# Patient Record
Sex: Male | Born: 1940 | Race: White | Hispanic: No | Marital: Married | Smoking: Former smoker
Health system: Southern US, Community
[De-identification: ages and names within clinical notes are randomized; demographics above are authoritative.]

## PROBLEM LIST (undated history)

## (undated) DIAGNOSIS — R351 Nocturia: Secondary | ICD-10-CM

## (undated) DIAGNOSIS — I2510 Atherosclerotic heart disease of native coronary artery without angina pectoris: Secondary | ICD-10-CM

## (undated) DIAGNOSIS — H3320 Serous retinal detachment, unspecified eye: Secondary | ICD-10-CM

## (undated) DIAGNOSIS — N433 Hydrocele, unspecified: Secondary | ICD-10-CM

## (undated) DIAGNOSIS — H40059 Ocular hypertension, unspecified eye: Secondary | ICD-10-CM

## (undated) DIAGNOSIS — E785 Hyperlipidemia, unspecified: Secondary | ICD-10-CM

## (undated) DIAGNOSIS — Z8719 Personal history of other diseases of the digestive system: Secondary | ICD-10-CM

## (undated) DIAGNOSIS — Z9889 Other specified postprocedural states: Secondary | ICD-10-CM

## (undated) DIAGNOSIS — H269 Unspecified cataract: Secondary | ICD-10-CM

## (undated) DIAGNOSIS — I480 Paroxysmal atrial fibrillation: Secondary | ICD-10-CM

## (undated) DIAGNOSIS — K56609 Unspecified intestinal obstruction, unspecified as to partial versus complete obstruction: Secondary | ICD-10-CM

## (undated) DIAGNOSIS — K5790 Diverticulosis of intestine, part unspecified, without perforation or abscess without bleeding: Secondary | ICD-10-CM

## (undated) DIAGNOSIS — I10 Essential (primary) hypertension: Secondary | ICD-10-CM

## (undated) DIAGNOSIS — M1990 Unspecified osteoarthritis, unspecified site: Secondary | ICD-10-CM

## (undated) DIAGNOSIS — I6529 Occlusion and stenosis of unspecified carotid artery: Secondary | ICD-10-CM

## (undated) DIAGNOSIS — I639 Cerebral infarction, unspecified: Secondary | ICD-10-CM

## (undated) DIAGNOSIS — I739 Peripheral vascular disease, unspecified: Secondary | ICD-10-CM

## (undated) DIAGNOSIS — I724 Aneurysm of artery of lower extremity: Secondary | ICD-10-CM

## (undated) HISTORY — PX: ABDOMINAL HERNIA REPAIR: SHX539

## (undated) HISTORY — DX: Serous retinal detachment, unspecified eye: H33.20

## (undated) HISTORY — DX: Peripheral vascular disease, unspecified: I73.9

## (undated) HISTORY — DX: Diverticulosis of intestine, part unspecified, without perforation or abscess without bleeding: K57.90

## (undated) HISTORY — PX: CORONARY ARTERY BYPASS GRAFT: SHX141

## (undated) HISTORY — DX: Personal history of other diseases of the digestive system: Z87.19

## (undated) HISTORY — DX: Atherosclerotic heart disease of native coronary artery without angina pectoris: I25.10

## (undated) HISTORY — DX: Ocular hypertension, unspecified eye: H40.059

## (undated) HISTORY — PX: CAROTID ENDARTERECTOMY: SUR193

## (undated) HISTORY — PX: OTHER SURGICAL HISTORY: SHX169

## (undated) HISTORY — DX: Cerebral infarction, unspecified: I63.9

## (undated) HISTORY — DX: Other specified postprocedural states: Z98.890

## (undated) HISTORY — DX: Paroxysmal atrial fibrillation: I48.0

## (undated) HISTORY — DX: Unspecified intestinal obstruction, unspecified as to partial versus complete obstruction: K56.609

---

## 1998-01-17 DIAGNOSIS — Z9889 Other specified postprocedural states: Secondary | ICD-10-CM

## 1998-01-17 HISTORY — DX: Other specified postprocedural states: Z98.890

## 1998-11-27 ENCOUNTER — Encounter: Admission: RE | Admit: 1998-11-27 | Discharge: 1998-11-27 | Payer: Self-pay | Admitting: Gastroenterology

## 1998-11-27 ENCOUNTER — Encounter: Payer: Self-pay | Admitting: Gastroenterology

## 1999-01-13 ENCOUNTER — Encounter: Payer: Self-pay | Admitting: Vascular Surgery

## 1999-01-14 ENCOUNTER — Encounter: Payer: Self-pay | Admitting: Vascular Surgery

## 1999-01-14 ENCOUNTER — Encounter (INDEPENDENT_AMBULATORY_CARE_PROVIDER_SITE_OTHER): Payer: Self-pay | Admitting: *Deleted

## 1999-01-14 ENCOUNTER — Inpatient Hospital Stay: Admission: RE | Admit: 1999-01-14 | Discharge: 1999-01-18 | Payer: Self-pay | Admitting: Vascular Surgery

## 1999-01-15 ENCOUNTER — Encounter: Payer: Self-pay | Admitting: Vascular Surgery

## 1999-11-01 ENCOUNTER — Encounter: Admission: RE | Admit: 1999-11-01 | Discharge: 1999-12-21 | Payer: Self-pay | Admitting: Gastroenterology

## 2000-01-18 HISTORY — PX: CORONARY ANGIOPLASTY: SHX604

## 2000-02-19 HISTORY — PX: OTHER SURGICAL HISTORY: SHX169

## 2000-02-24 ENCOUNTER — Ambulatory Visit (HOSPITAL_COMMUNITY): Admission: RE | Admit: 2000-02-24 | Discharge: 2000-02-25 | Payer: Self-pay | Admitting: *Deleted

## 2000-05-04 ENCOUNTER — Encounter: Payer: Self-pay | Admitting: Gastroenterology

## 2000-05-04 ENCOUNTER — Encounter: Admission: RE | Admit: 2000-05-04 | Discharge: 2000-05-04 | Payer: Self-pay | Admitting: Gastroenterology

## 2000-05-10 ENCOUNTER — Encounter: Admission: RE | Admit: 2000-05-10 | Discharge: 2000-05-10 | Payer: Self-pay | Admitting: Gastroenterology

## 2000-05-10 ENCOUNTER — Encounter: Payer: Self-pay | Admitting: Gastroenterology

## 2000-05-26 ENCOUNTER — Encounter: Payer: Self-pay | Admitting: Gastroenterology

## 2000-05-26 ENCOUNTER — Encounter: Admission: RE | Admit: 2000-05-26 | Discharge: 2000-05-26 | Payer: Self-pay | Admitting: Gastroenterology

## 2001-10-31 ENCOUNTER — Ambulatory Visit (HOSPITAL_COMMUNITY): Admission: RE | Admit: 2001-10-31 | Discharge: 2001-10-31 | Payer: Self-pay | Admitting: Gastroenterology

## 2002-01-17 HISTORY — PX: RETINAL DETACHMENT SURGERY: SHX105

## 2002-12-10 ENCOUNTER — Ambulatory Visit (HOSPITAL_COMMUNITY): Admission: RE | Admit: 2002-12-10 | Discharge: 2002-12-11 | Payer: Self-pay | Admitting: Ophthalmology

## 2005-12-06 ENCOUNTER — Encounter: Admission: RE | Admit: 2005-12-06 | Discharge: 2005-12-06 | Payer: Self-pay | Admitting: Gastroenterology

## 2006-03-20 ENCOUNTER — Ambulatory Visit: Payer: Self-pay | Admitting: Vascular Surgery

## 2007-01-18 HISTORY — PX: OTHER SURGICAL HISTORY: SHX169

## 2007-04-13 ENCOUNTER — Ambulatory Visit: Payer: Self-pay | Admitting: Vascular Surgery

## 2007-08-15 ENCOUNTER — Ambulatory Visit (HOSPITAL_BASED_OUTPATIENT_CLINIC_OR_DEPARTMENT_OTHER): Admission: RE | Admit: 2007-08-15 | Discharge: 2007-08-15 | Payer: Self-pay | Admitting: Orthopedic Surgery

## 2008-02-20 ENCOUNTER — Encounter: Admission: RE | Admit: 2008-02-20 | Discharge: 2008-02-20 | Payer: Self-pay | Admitting: Interventional Cardiology

## 2008-03-14 ENCOUNTER — Ambulatory Visit: Payer: Self-pay | Admitting: Vascular Surgery

## 2008-04-22 ENCOUNTER — Ambulatory Visit (HOSPITAL_COMMUNITY): Admission: RE | Admit: 2008-04-22 | Discharge: 2008-04-22 | Payer: Self-pay | Admitting: Surgery

## 2008-04-22 ENCOUNTER — Ambulatory Visit: Payer: Self-pay | Admitting: Surgery

## 2008-05-02 ENCOUNTER — Ambulatory Visit: Payer: Self-pay | Admitting: Vascular Surgery

## 2008-09-19 ENCOUNTER — Encounter: Admission: RE | Admit: 2008-09-19 | Discharge: 2008-09-19 | Payer: Self-pay | Admitting: Gastroenterology

## 2008-09-22 ENCOUNTER — Inpatient Hospital Stay (HOSPITAL_COMMUNITY): Admission: EM | Admit: 2008-09-22 | Discharge: 2008-09-25 | Payer: Self-pay | Admitting: Emergency Medicine

## 2008-09-22 HISTORY — PX: OTHER SURGICAL HISTORY: SHX169

## 2008-09-23 HISTORY — PX: OTHER SURGICAL HISTORY: SHX169

## 2008-11-14 ENCOUNTER — Ambulatory Visit: Payer: Self-pay | Admitting: Vascular Surgery

## 2008-11-14 ENCOUNTER — Encounter: Admission: RE | Admit: 2008-11-14 | Discharge: 2008-11-14 | Payer: Self-pay | Admitting: Vascular Surgery

## 2010-01-05 ENCOUNTER — Ambulatory Visit: Payer: Self-pay | Admitting: Vascular Surgery

## 2010-04-23 LAB — URINALYSIS, ROUTINE W REFLEX MICROSCOPIC
Bilirubin Urine: NEGATIVE
Bilirubin Urine: NEGATIVE
Ketones, ur: 15 mg/dL — AB
Ketones, ur: NEGATIVE mg/dL
Nitrite: NEGATIVE
Protein, ur: 100 mg/dL — AB
Specific Gravity, Urine: 1.013 (ref 1.005–1.030)
Specific Gravity, Urine: 1.025 (ref 1.005–1.030)
Urobilinogen, UA: 0.2 mg/dL (ref 0.0–1.0)
pH: 6 (ref 5.0–8.0)

## 2010-04-23 LAB — URINE CULTURE
Colony Count: NO GROWTH
Culture: NO GROWTH

## 2010-04-23 LAB — CBC
HCT: 42.8 % (ref 39.0–52.0)
Hemoglobin: 14.3 g/dL (ref 13.0–17.0)
MCHC: 33.3 g/dL (ref 30.0–36.0)
MCHC: 33.7 g/dL (ref 30.0–36.0)
MCV: 93.7 fL (ref 78.0–100.0)
Platelets: 179 10*3/uL (ref 150–400)
RDW: 16.2 % — ABNORMAL HIGH (ref 11.5–15.5)
RDW: 16.2 % — ABNORMAL HIGH (ref 11.5–15.5)
WBC: 6 10*3/uL (ref 4.0–10.5)
WBC: 9.6 10*3/uL (ref 4.0–10.5)

## 2010-04-23 LAB — DIFFERENTIAL
Basophils Absolute: 0.1 10*3/uL (ref 0.0–0.1)
Lymphocytes Relative: 10 % — ABNORMAL LOW (ref 12–46)
Neutro Abs: 7.4 10*3/uL (ref 1.7–7.7)
Neutrophils Relative %: 77 % (ref 43–77)

## 2010-04-23 LAB — COMPREHENSIVE METABOLIC PANEL
AST: 30 U/L (ref 0–37)
Albumin: 4.6 g/dL (ref 3.5–5.2)
Alkaline Phosphatase: 43 U/L (ref 39–117)
GFR calc non Af Amer: 47 mL/min — ABNORMAL LOW (ref >60)
Potassium: 2.8 mEq/L — ABNORMAL LOW (ref 3.5–5.1)
Sodium: 136 mEq/L (ref 135–145)

## 2010-04-23 LAB — BASIC METABOLIC PANEL
BUN: 11 mg/dL (ref 6–23)
BUN: 23 mg/dL (ref 6–23)
Calcium: 8.9 mg/dL (ref 8.4–10.5)
Calcium: 9 mg/dL (ref 8.4–10.5)
Creatinine, Ser: 0.91 mg/dL (ref 0.4–1.5)
Creatinine, Ser: 1.03 mg/dL (ref 0.4–1.5)
GFR calc Af Amer: >60 mL/min (ref >60)
GFR calc Af Amer: >60 mL/min (ref >60)
GFR calc non Af Amer: >60 mL/min (ref >60)
GFR calc non Af Amer: >60 mL/min (ref >60)
Glucose, Bld: 110 mg/dL — ABNORMAL HIGH (ref 70–99)
Potassium: 3.2 mEq/L — ABNORMAL LOW (ref 3.5–5.1)
Sodium: 137 mEq/L (ref 135–145)

## 2010-04-23 LAB — LIPASE, BLOOD: Lipase: 249 U/L — ABNORMAL HIGH (ref 11–59)

## 2010-04-23 LAB — MAGNESIUM
Magnesium: 2.2 mg/dL (ref 1.5–2.5)
Magnesium: 2.3 mg/dL (ref 1.5–2.5)

## 2010-04-23 LAB — ETHANOL: Alcohol, Ethyl (B): <5 mg/dL (ref 0–10)

## 2010-04-23 LAB — POCT CARDIAC MARKERS
CKMB, poc: 1.6 ng/mL (ref 1.0–8.0)
Troponin i, poc: <0.05 ng/mL (ref 0.00–0.09)

## 2010-04-23 LAB — URINE MICROSCOPIC-ADD ON

## 2010-04-23 LAB — LACTIC ACID, PLASMA: Lactic Acid, Venous: 3.6 mmol/L — ABNORMAL HIGH (ref 0.5–2.2)

## 2010-04-23 LAB — HEMOCCULT GUIAC POC 1CARD (OFFICE): Fecal Occult Bld: NEGATIVE

## 2010-04-28 LAB — POCT I-STAT, CHEM 8
Calcium, Ion: 1.15 mmol/L (ref 1.12–1.32)
Chloride: 102 mEq/L (ref 96–112)
Glucose, Bld: 94 mg/dL (ref 70–99)
HCT: 40 % (ref 39.0–52.0)
Hemoglobin: 13.6 g/dL (ref 13.0–17.0)
Potassium: 3.7 mEq/L (ref 3.5–5.1)

## 2010-06-01 NOTE — Procedures (Signed)
CAROTID DUPLEX EXAM   INDICATION:  Followup carotid artery disease.   HISTORY:  Diabetes:  No.  Cardiac:  Coronary artery disease, cardiac stents in February 2002.  Hypertension:  Yes.  Smoking:  Quit.  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 Arbie Cookey.  CV History:  No.  Amaurosis Fugax No, Paresthesias No, Hemiparesis No                                       RIGHT             LEFT  Brachial systolic pressure:         130               128  Brachial Doppler waveforms:         Normal            Normal  Vertebral direction of flow:        Antegrade         Antegrade  DUPLEX VELOCITIES (cm/sec)  CCA peak systolic                   72                81  ECA peak systolic                   99                73  ICA peak systolic                   131               234  ICA end diastolic                   37                54  PLAQUE MORPHOLOGY:                  Mixed             Mixed  PLAQUE AMOUNT:                      Mild              Moderate  PLAQUE LOCATION:                    ICA               ICA    IMPRESSION:  1. Right internal carotid artery velocity suggests a 1%-39% stenosis.  2. Left internal carotid artery velocity suggests a 40%-59% stenosis.         ___________________________________________  Larina Earthly, M.D.   EM/MEDQ  D:  01/05/2010  T:  01/05/2010  Job:  [id number]

## 2010-06-01 NOTE — Assessment & Plan Note (Signed)
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  He has  returned from an extensive cruise throughout [place], [place] and [place].  He  reports that he is continuing his walking program very vigorously and is  improving his walking distance prior to claudication.  He had undergone  a recent arteriography for evaluation of his left popliteal artery  aneurysm.  I reviewed this with the patient and again reviewed his CT  scan.  This does show mild to moderate stenosis in his adductor canal  bilaterally, more so on the left than on the right, the popliteal artery  aneurysm itself does not show any evidence of dissection or other  problems more concerning on angio.  Also of concern, he does have single  vessel peroneal runoff bilaterally.  I discussed all of these with [name].  I again reviewed his CT scan with him.  This did show a 2 cm  popliteal artery aneurysm on the left with a probable ulceration in the  area of mural thrombus.  I did explain the risk for thrombosis of this.  I feel that his risk of this is relatively small in a shorter period of  time and I have recommended that we see him again with a CTA in 6  months.  I explained certainly another option would be to proceed with  elective repair and at which he would require a left femoral to below-  knee popliteal bypass with vein and ligation of his popliteal artery  aneurysm.  I explained the potential limitation of this with single  vessel runoff and I feel that he certainly is at least equal risk or  greater risk with surgery than simple observation with his aneurysm.  He  will notify us immediately should he develop any ischemic symptoms and  we would recommend repair if he shows any change in his  aneurysm.  We  will see him again in 6 months.   Larina Earthly, M.D.  Electronically Signed   TFE/MEDQ  D:  05/02/2008  T:  05/02/2008  Job:  [id number]   cc:   Corky Crafts, MD  Armanda Magic, M.D.  Danise Edge, M.D.

## 2010-06-01 NOTE — Procedures (Signed)
VASCULAR LAB EXAM   INDICATION:   HISTORY:  Diabetes:  No.  Cardiac:  Coronary artery disease, cardiac stents, February 2002.  Hypertension:  Yes.   EXAM:  Bilateral popliteal artery scan to rule out aneurysm.   IMPRESSION:  1. Right popliteal artery maximum diameter measures 1.03 x 1.00 cm.  2. Left popliteal artery maximum diameter measures 0.87 x 0.84 cm.  3. Incidental finding of a dilatation of the left popliteal vein of      1.53 cm x 1.48 cm.   INDICATIONS:  Rule out left popliteal aneurysm.   ___________________________________________  Larina Earthly, M.D.   EM/MEDQ  D:  01/05/2010  T:  01/05/2010  Job:  [id number]

## 2010-06-01 NOTE — Assessment & Plan Note (Signed)
OFFICE VISIT   Marcus, Frank  DOB:  October 09, 1940                                       01/05/2010  ZOXWR#:[id number]   Patient presents today for follow-up of his diffuse peripheral vascular  occlusive disease.  He is status post resection grafting of abdominal  aortic aneurysm with aortic to bilateral distal common iliac artery  bypass in December 2000.  He does have known asymptomatic carotid  disease and also has small popliteal artery aneurysms bilaterally.  He  is here today for followup of this.  He remains quite active and has an  active walking program.  He does not have any new cardiac difficulty.  He did have coronary artery stenting in 2002.  He specifically denies  any amaurosis fugax, transient ischemic attack, or stroke.   PHYSICAL EXAMINATION:  A well-developed and well-nourished white male  appearing younger than stated age of 1 in no acute distress.  Blood  pressure is 153/70, pulse 56, respirations 18.  HEENT is normal.  Chest:  Clear bilaterally.  His carotid arteries are without bruits bilaterally.  He has 2+ radial pulses.  He has 2+ femoral, 2+ popliteal, and 2+  posterior tibial pulses.  He does have prominent popliteal pulsations  bilaterally.  Musculoskeletal shows no major deformities or cyanosis.  Neurologic:  No focal weakness or paresthesias.  Skin without ulcers or  rashes.   He underwent repeat carotid duplex in our office, and this shows no  significant change.  He has moderate 40% to 59% stenosis in the left and  no significant stenosis in the right carotid system.  He also underwent  imaging of his popliteal arteries showing no significant change and mild  ectasia in his popliteal arteries bilaterally.   I discussed this at length with patient. I explained that he does have  some irregularity from his old CT angiogram 1 year ago of his left  popliteal artery.  He understands symptoms of carotid occlusion and will  notify us immediately should this occur, otherwise we will see him again  in 1 year with repeat carotid duplex and repeat CT angiogram to rule out  any changes in his popliteal artery on the left.     Larina Earthly, M.D.  Electronically Signed   TFE/MEDQ  D:  01/05/2010  T:  01/05/2010  Job:  [id number]   cc:   Candyce Churn, M.D.

## 2010-06-01 NOTE — Op Note (Signed)
NAMEMANVIR, PRABHU              ACCOUNT NO.:  [id number]   MEDICAL RECORD NO.:  [id number]          PATIENT TYPE:  AMB   LOCATION:  SDS                          FACILITY:  [facility]   PHYSICIAN:  Juleen China IV, MDDATE OF BIRTH:  09/17/1940   DATE OF PROCEDURE:  04/22/2008  DATE OF DISCHARGE:                               OPERATIVE REPORT   PREOPERATIVE DIAGNOSIS:  Popliteal aneurysm.   POSTOPERATIVE DIAGNOSIS:  Popliteal aneurysm.   PROCEDURES PERFORMED:  1. Ultrasound access, right femoral artery.  2. Abdominal aortogram.  3. Bilateral lower extremity runoff.   INDICATIONS:  This is a 70 year old gentleman with history of  aortobifemoral bypass graft.  He was found to have popliteal aneurysm,  left greater than right on diagnostic CT angiogram.  He comes today for  further evaluation.   PROCEDURE:  The patient identified in the holding and taken to room #8,  was placed supine on the table.  Bilateral groins were prepped and  draped in a standard sterile fashion.  Time-out was called.  The right  femoral artery was evaluated by ultrasound and found to be widely  patent, easily compressible.  Lidocaine 1% was used for local  anesthesia.  Right femoral artery was accessed under ultrasound guidance  with an 18-gauge needle.  The 0.35 Bentson wire was advanced to the  aorta under fluoroscopic visualization.  Over the wire, an Omni flush  catheter was advanced at the level of L1 and an abdominal aortogram was  obtained.  Next, catheter was pulled down below the renal arteries and  bilateral lower extremity runoff was obtained.   FINDINGS:  Aortogram:  The visualized portions of the suprarenal  abdominal aorta showed minimal disease.  There are single renal arteries  bilaterally which are widely patent.  An aortic bifurcation graft is  visualized below the renal arteries.  There is mild irregularity at the  anastomotic site.  However, there does not appear to be a significant  stenosis.  Both limbs of the graft are widely patent.   Right lower extremity:  The right common femoral artery is widely  patent.  Right profunda femoral artery is widely patent.  The right  superficial femoral artery is patent throughout its course.  There is a  high-grade stenosis at the level of the adductor canal.  There is  ectasia seen of the popliteal artery throughout its course.  There is  diffuse disease in the distal popliteal artery.  The patient has single-  vessel runoff via the peroneal artery.   Left lower extremity:  The left common femoral artery is widely patent.  The left profunda femoral artery is widely patent.  The left superficial  femoral artery is patent.  There is diffuse disease throughout the  distal superficial femoral artery through the adductor canal.  There is  dilatation of the left popliteal artery.  There is irregularity in the  distal popliteal artery.  Anterior tibial and posterior tibial artery  are occluded at the origin.  Peroneal artery is visualized, however,  this takes a deviated course at the mid calf suggesting it is  collateralized.  There is reconstitution of the posterior tibial artery  in the mid calf, it is not well visualized across the foot.   After the above images were obtained, decision was made to terminate the  procedure.  Catheter and wires were removed.  The patient was taken to  holding area for sheath pull.   IMPRESSION:  1. Widely patent aortobifemoral bypass graft.  2. Bilateral superficial femoral artery disease, left greater than      right  3. Bilateral popliteal artery ectasia/aneurysmal disease changes.  4. Bilateral single-vessel runoff via the peroneal artery.      Jorge Ny, MD  Electronically Signed     VWB/MEDQ  D:  04/22/2008  T:  04/22/2008  Job:  [id number]

## 2010-06-01 NOTE — Procedures (Signed)
CAROTID DUPLEX EXAM   INDICATION:  Followup carotid artery disease.   HISTORY:  Diabetes:  No.  Cardiac:  Coronary artery disease, cardiac stents in February of 2002.  Hypertension:  Yes.  Smoking:  Quit 30 years ago.  Previous Surgery:  The patient had repair of an abdominal aortic  aneurysm in 12/1998 by Dr. Arbie Cookey.  CV History:  Amaurosis Fugax No, Paresthesias No, Hemiparesis No                                       RIGHT             LEFT  Brachial systolic pressure:         130               124  Brachial Doppler waveforms:         Biphasic          Biphasic  Vertebral direction of flow:        Antegrade         Antegrade  DUPLEX VELOCITIES (cm/sec)  CCA peak systolic                   96                104  ECA peak systolic                   72                97  ICA peak systolic                   130               218  ICA end diastolic                   41                60  PLAQUE MORPHOLOGY:                  Mixed             Mixed  PLAQUE AMOUNT:                      Mild              Moderate  PLAQUE LOCATION:                    ICA               ICA   IMPRESSION:  1. Right ICA stenosis 40-59%.  2. Left ICA stenosis 40-59%, upper end of range.  3. Study essentially unchanged from 03/20/2006.   ___________________________________________  Larina Earthly, M.D.   DP/MEDQ  D:  04/13/2007  T:  04/13/2007  Job:  [id number]

## 2010-06-01 NOTE — Consult Note (Signed)
NEW PATIENT CONSULTATION   [name], [name]  DOB:  17-Jun-1940                                       03/14/2008  ZOXWR#:[id number]   Patient presents today for evaluation of recently discovered left  popliteal artery aneurysm.  He had been having lower extremity calf  claudication symptoms and underwent CTA for further evaluation of this.  On CTA, he was found to have  moderate bilateral superficial femoral  artery stenosis.  Of concern, he was also found to have a left popliteal  artery aneurysm with mural thrombus and either dissection or ulcerative  plaque.  He has been on a very active walking program and is improving  his claudication-type symptoms.  I am seeing him for further discussion.  He is well known to me for prior resection grafting with infrarenal  abdominal aortic aneurysm in October, 2003.  He has had no difficulty  following this.   He has a history of coronary artery disease with cardiac stents in 2003.  He did not have a myocardial infarction.  He is hypertensive.  He quit  smoking 35 years ago.  He does have social alcohol consumption daily.   REVIEW OF SYSTEMS:  Positive for heart murmur and pain with walking,  otherwise negative.   PHYSICAL EXAMINATION:  Height is 6 feet 1 inches.  He weighs 195 pounds.  A well-developed white male appearing his stated age of 2.  Blood  pressure is 125/71, pulse 62, respirations 18.  His radial pulses are 2+  bilaterally.  He has 2+ femoral, 2+ popliteal, and 1 to 2+ dorsalis  pedis pulses bilaterally.  Popliteal artery pulse is slightly prominent  bilaterally.  He did not have any tenderness in this area.   I reviewed his CAT scan.  This showed an excellent 10-year appearance of  his infrarenal aortobiiliac bypass.  His lower extremity flow is patent.  He does have an area of ectasia in both popliteal arteries, more so on  the left with a slightly less than 2 cm maximal dilatation.  I am  concerned  regarding the configuration of the aneurysm on the left.  He  does have mural thrombus present and irregular flow in this.   I discussed the concern regarding thromboses of popliteal artery  aneurysm and the high risk of amputation resulting from this.  He is  about to leave on an extended overseas trip.  I explained that I would  certainly proceed with this and would take the usual precautions of  walking every several hours during the flight.  He will contact us when  he returns, and we will schedule arteriography as an outpatient for  further evaluation of his left popliteal artery aneurysm.  Further  recommendations pending this result.   Larina Earthly, M.D.  Electronically Signed   TFE/MEDQ  D:  03/14/2008  T:  03/17/2008  Job:  [id number]   cc:   Corky Crafts, MD  Armanda Magic, M.D.  Danise Edge, M.D.

## 2010-06-01 NOTE — Assessment & Plan Note (Signed)
OFFICE VISIT   DECLAN, MIER  DOB:  Sep 04, 1940                                       11/14/2008  ZOXWR#:[id number]   Patient presents today for continued follow-up of his popliteal artery  aneurysms.  Since my last visit with him, he did have hospitalization  and surgery for a small bowel obstruction, was treated by Dr. Wenda Low.  He is returning to his usual activities following this.  He  does not have any claudication-type symptoms.  He is extremely vigorous  and continues to be very active, playing golf and walking with this.  He  does have good control of his high blood pressure and elevated  cholesterol.   Family history is significant for premature atherosclerotic disease in  his mother.   He is married, retired, with 3 children.  Quit smoking 35 years ago.  Has 2 to 3 ounces of alcohol daily.   Review of systems is noted in his chart with arthritic pain in his legs.   PHYSICAL EXAMINATION:  A well-developed and well-nourished white male  appearing stated age of 78.  His blood pressure is 127/70, pulse 65,  respirations 18, temperature 97.9.  Abdominal exam reveals a well-healed  lower midline incision.  No tenderness.  He does have 2+ femoral pulses.  He is grossly intact neurologically, and he is in no distress in  general.  He does have prominent popliteal pulses bilaterally and no  pain and palpable pedal pulses bilaterally.   He underwent a CT angiogram today, and I have reviewed this with  patient.  This does show no change in his popliteal ectasia on the left  with a maximal diameter of approximately 2 cm.  He does have  irregularity of flow, and this is no change from his prior study.  He  was reassured of this and will see [place] again in 1 year with ultrasound of  his popliteal arteries.   Larina Earthly, M.D.  Electronically Signed   TFE/MEDQ  D:  11/14/2008  T:  11/18/2008  Job:  [id number]   cc:   Corky Crafts, MD  Armanda Magic, M.D.  Danise Edge, M.D.  [place] Daphine Deutscher, MD

## 2010-06-01 NOTE — Op Note (Signed)
NAMEBENTON, TOOKER              ACCOUNT NO.:  [id number]   MEDICAL RECORD NO.:  [id number]          PATIENT TYPE:  AMB   LOCATION:  DSC                          FACILITY:  [facility]   PHYSICIAN:  Artist Pais. Weingold, M.D.DATE OF BIRTH:  1940-10-07   DATE OF PROCEDURE:  08/15/2007  DATE OF DISCHARGE:                               OPERATIVE REPORT   PREOPERATIVE DIAGNOSIS:  Chronic right small finger stenosing  tenosynovitis.   POSTOPERATIVE DIAGNOSIS:  Chronic right small finger stenosing  tenosynovitis.   PROCEDURE:  Right A1 pulley release, fifth digit.   SURGEON:  Artist Pais. Mina Marble, MD   ASSISTANT:  None.   ANESTHESIA:  Monitored anesthesia care with 4 mL of combination of 2%  plain lidocaine and 0.25% plain Marcaine.   TOURNIQUET TIME:  11 minutes.   No complications.   No drains.   OPERATIVE REPORT:  The patient was taken to the operating suite after  the induction of adequate IV sedation.  The right upper extremity was  prepped and draped in usual sterile fashion.  An Esmarch was used to  exsanguinate the limb.  Tourniquet was then inflated to 275 mmHg.  At  this point in time, 4 mL of a combination of 2% plain lidocaine and  0.25% Marcaine was injected into the A1 pulley area of the right hand  over the fifth digit A1 pulley area.  Once the anesthesia was obtained,  transverse incision was made of the A1 pulley.  Neurovascular bundle was  identified and retracted.  The A1 pulley was split with 15 blade.  The  FDS and FDP tendon was lysed of all adhesions and was irrigated and  loosely closed with a 4-0 Vicryl Rapide subcuticular stitch.  Steri-  Strips, 4x4s, and a compressive dressing was applied.  The patient  tolerated the procedure well and went to recovery room in stable  fashion.      Artist Pais Mina Marble, M.D.  Electronically Signed     MAW/MEDQ  D:  08/15/2007  T:  08/15/2007  Job:  [id number]

## 2010-06-04 NOTE — Op Note (Signed)
NAME:  [name], [name]                        ACCOUNT NO.:  [id number]   MEDICAL RECORD NO.:  [id number]                   PATIENT TYPE:  OIB   LOCATION:  5734                                 FACILITY:  [facility]   PHYSICIAN:  Beulah Gandy. Ashley Royalty, M.D.              DATE OF BIRTH:  [birth year]   DATE OF PROCEDURE:  DATE OF DISCHARGE:                                 OPERATIVE REPORT   PREOPERATIVE DIAGNOSIS:  Multiple breaks, left retina, retinal detachment,  right eye.   POSTOPERATIVE DIAGNOSIS:  Multiple  breaks, left retina, retinal detachment,  right eye.   PROCEDURE:  1. Laser for retinal breaks, left eye.  2. Scleral buckle with laser, right eye.   DESCRIPTION OF PROCEDURE:  Usual prep and drape. The indirect ophthalmoscope  laser was moved into place and 298 burns were placed around retinal breaks  at 12 o'clock and 4 o'clock in the left eye. The power was 380 milliwatts,  1000 microns each and 0.1 seconds each. The eye was treated  ointment and  taped closed. Attention was carried to the right eye where  the usual prep  and drape was performed. A 360 degree limbal peritomy, isolation of 4  muscles on 2-0 silk. Localization of break at 2 o'clock.   Scleral dissection from 1 o'clock to 4 o'clock to admit a #279 intrascleral  implant. Diathermy placed in the bed. Perforation site chosen at 2:30  o'clock in the bed. A small  amount of subretinal fluid came fourth. A 508 G  radial segment was placed beneath the break at 2:30 o'clock and the 279  implant was placed against  the globe. The 240 band  was placed around the  eye with belt loops at 6, 8 and 10 o'clock. The 270 sleeve was placed at 12  o'clock.   The scleral flaps were closed over the scleral buckles. The band was  adjusted to a proper indentation of the globe. Paracentesis x1. Obtained a  closing tension of 10 with a Behr keratometer. The buckle was trimmed. The  band was adjusted and trimmed. The sutures were knotted and  trimmed.  Indirect ophthalmoscopy showed the retina to be lying nicely on the buckle  with the break well supported.   The indirect ophthalmoscope laser was moved into place again and 560 burns  were placed around the retinal periphery, especially around the  retinal  break on the scleral buckle. The power  was  600 milliwatts, 1000 microns  each and 0.1 seconds each. The retina was lying nicely on the buckle with  the retinal break well supported.   The conjunctiva was reposited with 7-0 chromic suture. Polymixin and  gentamicin were irrigated in the tenon space. Atropine solution was applied.  Decadron 10 mg was injected into the lower subconjunctival space. Marcaine  was injected around the globe  for postoperative pain. Polysporin, a patch  and shield were placed. The patient  was awakened and taken to the recovery  room in satisfactory condition.                                               Beulah Gandy. Ashley Royalty, M.D.    JDM/MEDQ  D:  12/10/2002  T:  12/11/2002  Job:  [id number]

## 2010-06-04 NOTE — Op Note (Signed)
   NAME:  [name], [name]                        ACCOUNT NO.:  [id number]   MEDICAL RECORD NO.:  [id number]                   PATIENT TYPE:  AMB   LOCATION:  ENDO                                 [facility]AC[facility]   PHYSICIAN:  Danise Edge, M.D.                DATE OF BIRTH:  08/15/1940   DATE OF PROCEDURE:  10/31/2001  DATE OF DISCHARGE:                                 OPERATIVE REPORT   PROCEDURE:  Screening colonoscopy.   INDICATIONS FOR PROCEDURE:  Mr. Reeves Musick is a 70 year old male born  [birth year]. Mr. Phillips is scheduled to undergo his second screening  colonoscopy. He has a positive family history for colon cancer. He has no  personal history of colorectal neoplasia.   CHRONIC MEDICATIONS:  1. Vioxx.  2. Altace.  3. Lipitor.  4. Aspirin.  5. Zetia.   PAST MEDICAL PROBLEMS:  1. Coronary artery disease.  2. Three coronary stents placed.  3. Insignificant coronary artery stenosis monitored by Dr. Gretta Began.  4. Abdominal aortic aneurysm repair, January 14, 1999.  5. Increased intraocular pressure monitored by his ophthalmologist.  6. Inguinal herniorrhaphy.  7. Hypercholesterolemia.   ENDOSCOPIST:  Charolett Bumpers, M.D.   PREMEDICATION:  Versed 10 mg, Demerol 50 mg .   ENDOSCOPE:  Olympus pediatric colonoscope.   DESCRIPTION OF PROCEDURE:  After obtaining informed consent, Mr. Arnone was  placed in the left lateral decubitus position. I administered intravenous  Demerol and intravenous Versed to achieve conscious sedation for the  procedure. The patient's blood pressure, oxygen saturation and cardiac  rhythm were monitored throughout the procedure and documented in the medical  record.   Anal inspection was normal. Digital rectal exam revealed a nonnodular  prostate. The Olympus pediatric video colonoscope was introduced into the  rectum and easily advanced to the cecum. Colonic preparation for the exam  today was excellent.   RECTUM:   Normal.   SIGMOID COLON AND DESCENDING COLON:  Left colonic diverticulosis.   SPLENIC FLEXURE:  Normal.   TRANSVERSE COLON:  Normal.   HEPATIC FLEXURE:  Normal.   ASCENDING COLON:  Normal.   CECUM AND ILEOCECAL VALVE:  Normal.   ASSESSMENT:  Normal screening proctocolonoscopy to the cecum except for the  presence of left colonic diverticulosis.   RECOMMENDATIONS:  Repeat colonoscopy in five years.                                                Danise Edge, M.D.    MJ/MEDQ  D:  10/31/2001  T:  10/31/2001  Job:  [id number]   cc:   Larina Earthly, M.D.  [address]  [place]  [place] [address]  Fax: [phone]

## 2010-06-04 NOTE — Cardiovascular Report (Signed)
[place]  Patient:    [name], [name]                     MRN: [id number] Proc. Date: 02/24/00 Adm. Date:  [id number] Disc. Date: [id number] Attending:  Meade Maw A CC:         Lemont Fillers. Fraser Din, M.D.  Pearla Dubonnet, M.D.   Cardiac Catheterization  PROCEDURES PERFORMED: 1. Percutaneous coronary intervention/stent implantation, proximal left    anterior descending. 2. Percutaneous coronary intervention/stent implantation, first obtuse    marginal.  INDICATIONS:  [name] is a 70 year old man with a recent onset of angina pectoris.  He underwent myocardial perfusion study which revealed evidence of significant anterior ischemia.  Coronary angiography earlier today by Dr. Meade Maw has revealed a subtotal stenosis in the proximal LAD, as well as a severe stenosis at the origin of the large first marginal branch. The patient is to undergo catheter-based revascularization at this time.  DESCRIPTION OF PROCEDURE:  The previously placed 6 French catheter sheath was exchanged over a long guiding J wire for a 7 French catheter sheath.  The groin had previously been reprepped and draped in the usual sterile fashion. Local anesthesia was obtained with the infiltration of 1% lidocaine. The patient then received 5000 units of heparin double bolus and constant infusion of Integrilin.  Initial ACT was 235 seconds.  A 7 French FL4 Scimed wiseguide guiding catheter was advanced to the ascending aorta where the left coronary os was engaged.  A 0.014 inch Scimed luge intracoronary guidewire was passed across the lesion in the proximal LAD without difficulty.  The lesion was then primarily stented using a 3.5/9 mm Scimed NIR Elite intracoronary stent.  Deployment pressure was 14 atmospheres for not greater than 32 seconds.  Following conformation of adequate patency in orthogonal views, the wire was withdrawn and advanced down the circumflex  into the marginal branch.  Initial balloon dilatations were performed with a 3.25/10 mm Scimed Cutting Balloon. Each inflation was taken to 8 atmospheres for 1 minute and 30 seconds. Subsequent to this the Cutting Balloon was removed and a 3.5/18 mm Scimed NIR Elite intracoronary stent was placed across the lesion at the origin of the obtuse marginal.  It was inflated to 12 atmospheres for 49 seconds.  This resulted in wide patency but there was some "haziness" distal to the stent. The stent balloon was therefore returned to the obtuse marginal just distal to the stent and inflated there at 4 atmospheres for 1 minute.  Following conformation of adequate patency in orthogonal views, both with and without the guidewire in place, the guiding catheter was removed.  Hemostasis was achieved by use of the Perclose system.  There was a good distal pulse and the patient was transferred to the recovery area in stable condition.  ANGIOGRAPHY:  As mentioned, the lesion treated was in the proximal anterior descending artery, just before the origin of a large diagonal branch. Following balloon dilatation and stent implantation, there was a 10% residual stenosis.  Importantly, improved distal flow revealed a moderate stenosis in the LAD just after the origin of the diagonal.  It did appear to be 50-60% stenotic and eccentric in nature.  Intravascular ultrasound revealed the lesion to be not greater than 50% stenotic by diameter and by area it was 68%. It was therefore felt not to be physiologically significant.  Following Cutting Balloon dilatation and stent implantation in the origin of the obtuse marginal  branch, there was no residual stenosis.  FINAL IMPRESSION: 1. Atherosclerotic cerebrovascular disease, two-vessel. 2. Status post successful percutaneous transluminal coronary angioplasty and    stent implantation, proximal anterior descending artery. 3. Status post successful percutaneous  transluminal coronary angioplasty and    stent implantation in the left circumflex extending into the marginal    branch. 4. Typical angina was reproduced with device insertion and balloon inflation. DD:  02/24/00 TD:  02/26/00 Job: [id number]

## 2010-06-04 NOTE — Cardiovascular Report (Signed)
[name]  Patient:    [name], [name]                     MRN: [id number] Proc. Date: 02/24/00 Adm. Date:  [id number] Disc. Date: [id number] Attending:  Meade Frank A CC:         [name], M.D.   Cardiac Catheterization  INDICATION FOR PROCEDURE:  A 70 year old gentleman who was initially evaluated in December 2000 as a preop evaluation to an abdominal aneurysm repair. Stress test was performed for preop evaluation, and the patient exercised for 9 minutes on a full Bruce protocol.  His ECG at peak revealed sinus tachycardia with 1 mm upslope in ST depression.  He had no inducible ischemia by clinical criteria, no complaints of chest pain.  The patient had a repeat stress Cardiolite for complaints of chest pain on February 22, 2000.  At 4 minutes into the stress test, the patient complained of chest pain.  This was associated with bigeminal rhythm.  The stress test was stopped secondary to bigeminy.  Cardiolite revealed no appreciable reversibility.  The patient is brought to the catheterization lab for early positive stress test by clinical and ECG criteria.  PROCEDURE:  Left heart catheterization.  CARDIOLOGIST:  [name], M.D.  DESCRIPTION OF PROCEDURE:  After obtaining written informed consent, the patient was brought to the cardiac catheterization lab in postabsorptive state.  Preop sedation was achieved using IV Versed.  The right groin was prepped and draped in the usual sterile fashion.  Local anesthesia was achieved using 1% lidocaine.  The right femoral artery was identified by using radiographic technique.  Selective coronary angiography was performed using the JL-4, JR-4 Judkins catheter.  Multiple views were obtained.  Single-plane ventriculogram was performed in the RAO position using a 6-French pigtail curved catheter.  The right coronary artery was used to engage the left subclavian for identification of the  left internal mammary artery.  There was tortuosity in the distal iliac which required some navigation.  After the films were obtained, it was felt that there were significant lesions in the LAD and obtuse marginal.   The hemostasis sheath was sewn into place.  The patient was given 3000 units of heparin and was transferred to the holding area pending the review of the films per Dr. Amil Frank.  FINDINGS:  The aortic pressure was 164/84, LV pressure 160/12.  There was no gradient noted on pullback.  Single plane ventriculogram revealed normal wall motion. Ejection fraction approximately 65%.  CORONARY ANGIOGRAPHY:  Left Main Coronary Artery:  The left main coronary artery bifurcated into the left anterior descending artery and circumflex vessel.  There was a distal 40% lesion in the left main coronary artery.  There was mild calcification of the proximal left main.  Left anterior descending:  The left anterior descending gave rise to a small D1, moderate D2, small D3, and went on to end as an apical recurrent branch. There was a long 90% lesion following the first diagonal distally and also involved the second diagonal.  Circumflex vessel:  The circumflex was a large vessel and gave rise to trivial obtuse marginal #1, obtuse marginal #2, then a large obtuse marginal #2. There was a 90% ostial lesion in the first large obtuse marginal branch.  Right coronary artery:  The right coronary artery was a large dominant artery, had luminal irregularities of 40% in proximal right coronary artery and 40 to 50% in the mid vessel.  The left internal mammary artery was patent.  FINAL IMPRESSION: 1. Early positive graded exercise test. 2. Borderline distal main disease. 3. Critical disease in the proximal left anterior descending artery and first    obtuse marginal. 4. Noncritical disease in the right coronary artery.  The films will be reviewed by Dr. Amil Frank.  Further decision regarding  method of revascularization will be pending outcome of discussion of the films. DD:  02/24/00 TD:  02/25/00 Job: [id number]

## 2010-08-27 ENCOUNTER — Encounter (INDEPENDENT_AMBULATORY_CARE_PROVIDER_SITE_OTHER): Payer: PRIVATE HEALTH INSURANCE | Admitting: Ophthalmology

## 2010-08-27 DIAGNOSIS — H2510 Age-related nuclear cataract, unspecified eye: Secondary | ICD-10-CM

## 2010-08-27 DIAGNOSIS — H33009 Unspecified retinal detachment with retinal break, unspecified eye: Secondary | ICD-10-CM

## 2010-08-27 DIAGNOSIS — H43819 Vitreous degeneration, unspecified eye: Secondary | ICD-10-CM

## 2010-08-27 DIAGNOSIS — H33309 Unspecified retinal break, unspecified eye: Secondary | ICD-10-CM

## 2010-10-15 LAB — BASIC METABOLIC PANEL
BUN: 19
CO2: 27
Calcium: 9.6
Chloride: 103
Creatinine, Ser: 1.02
GFR calc Af Amer: >60
GFR calc non Af Amer: >60
Glucose, Bld: 95
Potassium: 4.1
Sodium: 137

## 2010-10-15 LAB — POCT HEMOGLOBIN-HEMACUE: Hemoglobin: 11.3 — ABNORMAL LOW

## 2011-01-17 DIAGNOSIS — N433 Hydrocele, unspecified: Secondary | ICD-10-CM

## 2011-01-17 NOTE — H&P (Signed)
Urology Admission H&P  Chief Complaint: Left hydrocele.  History of Present Illness:History of Present Illness   [name] returns for a 72-month follow-up. Again, he is diagnosed with a progressively enlarging left hydrocele. This has become somewhat more bothersome to him and he interested in surgical correction. 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 Again, far from perfect, but certainly much more acceptable at this point. PSA testing has been normal. No other new complaints or issues today. Again, he is interested in surgical correction sometime after the holidays.    No past medical history on file. No past surgical history on file.  Home Medications:  No prescriptions prior to admission   Allergies: Allergies not on file  No family history on file. Social History:  does not have a smoking history on file. He does not have any smokeless tobacco history on file. His alcohol and drug histories not on file.  Review of Systems  Constitutional: Negative for fever.  Genitourinary: Positive for urgency and frequency. Negative for hematuria.  Musculoskeletal: Positive for back pain and joint pain.  All other systems reviewed and are negative.    Physical Exam:  Vital signs in last 24 hours:   Physical Exam  Constitutional: He is oriented to person, place, and time. He appears well-developed and well-nourished.  Cardiovascular: Normal rate.   Respiratory: Effort normal and breath sounds normal.  GI: Soft. He exhibits distension. There is no tenderness. A hernia is present. Hernia confirmed positive in the right inguinal area. Hernia confirmed negative in the left inguinal area.  Genitourinary: Penis normal. Left testis shows swelling. Left testis shows no mass.  Musculoskeletal: Normal range of motion.  Neurological: He is alert and oriented to person, place, and time.  Skin: Skin is warm and dry.  Psychiatric: He has  a normal mood and affect.    Laboratory Data:  No results found for this or any previous visit (from the past 24 hour(s)). No results found for this or any previous visit (from the past 240 hour(s)). Creatinine: No results found for this basename: CREATININE:7 in the last 168 hours Baseline Creatinine:   Impression/Assessment:  Left hydrocele  Plan:  Discussion/Summary   [name] has symptomatic and enlarging left hydrocele. He is interested in surgical correction and that is quite reasonable. We will plan on doing this early into next year after the holidays. We discussed the procedure, recovery issues, pros and cons.  [name] S 01/17/2011, 12:45 PM

## 2011-01-19 ENCOUNTER — Encounter (HOSPITAL_BASED_OUTPATIENT_CLINIC_OR_DEPARTMENT_OTHER): Payer: Self-pay | Admitting: *Deleted

## 2011-01-19 NOTE — Progress Notes (Signed)
NPO AFTER MN. ARRIVES AT 0615. NEEDS ISTAT. CURRENT EKG, VISIT NOTE, STRESS TEST TO BE FAXED FROM DR [name] TURNER.

## 2011-01-24 ENCOUNTER — Encounter (HOSPITAL_BASED_OUTPATIENT_CLINIC_OR_DEPARTMENT_OTHER)
Admission: RE | Disposition: A | Discharge status: Home / Self Care | Payer: Self-pay | Source: Ambulatory Visit | Attending: Urology

## 2011-01-24 ENCOUNTER — Ambulatory Visit (HOSPITAL_BASED_OUTPATIENT_CLINIC_OR_DEPARTMENT_OTHER): Payer: Medicare Other | Admitting: Anesthesiology

## 2011-01-24 ENCOUNTER — Encounter (HOSPITAL_BASED_OUTPATIENT_CLINIC_OR_DEPARTMENT_OTHER): Payer: Self-pay | Admitting: Anesthesiology

## 2011-01-24 ENCOUNTER — Ambulatory Visit (HOSPITAL_BASED_OUTPATIENT_CLINIC_OR_DEPARTMENT_OTHER)
Admission: RE | Admit: 2011-01-24 | Discharge: 2011-01-24 | Disposition: A | Discharge status: Home / Self Care | Payer: Medicare Other | Source: Ambulatory Visit | Attending: Urology | Admitting: Urology

## 2011-01-24 ENCOUNTER — Encounter (HOSPITAL_BASED_OUTPATIENT_CLINIC_OR_DEPARTMENT_OTHER): Payer: Self-pay | Admitting: *Deleted

## 2011-01-24 DIAGNOSIS — N433 Hydrocele, unspecified: Secondary | ICD-10-CM | POA: Diagnosis present

## 2011-01-24 HISTORY — DX: Occlusion and stenosis of unspecified carotid artery: I65.29

## 2011-01-24 HISTORY — DX: Hyperlipidemia, unspecified: E78.5

## 2011-01-24 HISTORY — DX: Nocturia: R35.1

## 2011-01-24 HISTORY — PX: HYDROCELE EXCISION: SHX482

## 2011-01-24 HISTORY — DX: Hydrocele, unspecified: N43.3

## 2011-01-24 HISTORY — DX: Essential (primary) hypertension: I10

## 2011-01-24 HISTORY — DX: Unspecified osteoarthritis, unspecified site: M19.90

## 2011-01-24 HISTORY — DX: Other specified postprocedural states: Z98.890

## 2011-01-24 HISTORY — DX: Unspecified cataract: H26.9

## 2011-01-24 HISTORY — DX: Aneurysm of artery of lower extremity: I72.4

## 2011-01-24 LAB — POCT I-STAT 4, (NA,K, GLUC, HGB,HCT)
Glucose, Bld: 87 mg/dL (ref 70–99)
HCT: 45 % (ref 39.0–52.0)
Potassium: 3.9 mEq/L (ref 3.5–5.1)

## 2011-01-24 SURGERY — HYDROCELECTOMY
Anesthesia: General | Laterality: Left

## 2011-01-24 MED ORDER — LIDOCAINE HCL (CARDIAC) 20 MG/ML IV SOLN
INTRAVENOUS | Status: DC | PRN
  Administered 2011-01-24: 60 mg via INTRAVENOUS

## 2011-01-24 MED ORDER — GLYCOPYRROLATE 0.2 MG/ML IJ SOLN
INTRAMUSCULAR | Status: DC | PRN
  Administered 2011-01-24 (×2): 0.2 mg via INTRAVENOUS

## 2011-01-24 MED ORDER — DEXAMETHASONE SODIUM PHOSPHATE 4 MG/ML IJ SOLN
INTRAMUSCULAR | Status: DC | PRN
  Administered 2011-01-24: 10 mg via INTRAVENOUS

## 2011-01-24 MED ORDER — PROPOFOL 10 MG/ML IV EMUL
INTRAVENOUS | Status: DC | PRN
  Administered 2011-01-24: 200 mg via INTRAVENOUS

## 2011-01-24 MED ORDER — LACTATED RINGERS IV SOLN
INTRAVENOUS | Status: DC
  Administered 2011-01-24 (×2): via INTRAVENOUS

## 2011-01-24 MED ORDER — HYDROCODONE-ACETAMINOPHEN 5-325 MG PO TABS
1.0000 | ORAL_TABLET | Freq: Four times a day (QID) | ORAL | Status: DC | PRN
  Administered 2011-01-24: 1 via ORAL

## 2011-01-24 MED ORDER — FENTANYL CITRATE 0.05 MG/ML IJ SOLN: 25.0000 ug | INTRAMUSCULAR | Status: DC | PRN

## 2011-01-24 MED ORDER — CEFAZOLIN SODIUM 1-5 GM-% IV SOLN
INTRAVENOUS | Status: DC | PRN
  Administered 2011-01-24: 2 g via INTRAVENOUS

## 2011-01-24 MED ORDER — BUPIVACAINE HCL (PF) 0.25 % IJ SOLN
INTRAMUSCULAR | Status: DC | PRN
  Administered 2011-01-24: 7 mL

## 2011-01-24 MED ORDER — HYDROCODONE-ACETAMINOPHEN 5-325 MG PO TABS: 1.0000 | ORAL_TABLET | Freq: Four times a day (QID) | ORAL | Status: AC | PRN

## 2011-01-24 MED ORDER — ONDANSETRON HCL 4 MG/2ML IJ SOLN
INTRAMUSCULAR | Status: DC | PRN
  Administered 2011-01-24: 4 mg via INTRAVENOUS

## 2011-01-24 MED ORDER — FENTANYL CITRATE 0.05 MG/ML IJ SOLN
INTRAMUSCULAR | Status: DC | PRN
  Administered 2011-01-24: 100 ug via INTRAVENOUS

## 2011-01-24 SURGICAL SUPPLY — 46 items
APL SKNCLS STERI-STRIP NONHPOA (GAUZE/BANDAGES/DRESSINGS) ×1
BANDAGE GAUZE ELAST BULKY 4 IN (GAUZE/BANDAGES/DRESSINGS) ×2 IMPLANT
BENZOIN TINCTURE PRP APPL 2/3 (GAUZE/BANDAGES/DRESSINGS) ×2 IMPLANT
BLADE SURG 10 STRL SS (BLADE) ×2 IMPLANT
BLADE SURG 15 STRL LF DISP TIS (BLADE) ×1 IMPLANT
BLADE SURG 15 STRL SS (BLADE) ×2
BLADE SURG ROTATE 9660 (MISCELLANEOUS) ×1 IMPLANT
CANISTER SUCTION 1200CC (MISCELLANEOUS) IMPLANT
CANISTER SUCTION 2500CC (MISCELLANEOUS) ×1 IMPLANT
CLEANER CAUTERY TIP 5X5 PAD (MISCELLANEOUS) ×1 IMPLANT
CLOTH BEACON ORANGE TIMEOUT ST (SAFETY) ×2 IMPLANT
COVER MAYO STAND STRL (DRAPES) ×2 IMPLANT
COVER TABLE BACK 60X90 (DRAPES) ×2 IMPLANT
DRAIN PENROSE 18X1/4 LTX STRL (WOUND CARE) IMPLANT
DRAPE PED LAPAROTOMY (DRAPES) ×2 IMPLANT
DRSG TEGADERM 2-3/8X2-3/4 SM (GAUZE/BANDAGES/DRESSINGS) IMPLANT
ELECT NDL TIP 2.8 STRL (NEEDLE) ×1 IMPLANT
ELECT NEEDLE TIP 2.8 STRL (NEEDLE) ×2 IMPLANT
ELECT REM PT RETURN 9FT ADLT (ELECTROSURGICAL) ×2
ELECTRODE REM PT RTRN 9FT ADLT (ELECTROSURGICAL) ×1 IMPLANT
GLOVE BIO SURGEON STRL SZ7.5 (GLOVE) ×2 IMPLANT
GLOVE BIOGEL M 6.5 STRL (GLOVE) ×1 IMPLANT
GLOVE ECLIPSE 6.0 STRL STRAW (GLOVE) ×1 IMPLANT
GOWN W/COTTON TOWEL STD LRG (GOWNS) ×2 IMPLANT
GOWN XL W/COTTON TOWEL STD (GOWNS) ×2 IMPLANT
NDL HYPO 25X1 1.5 SAFETY (NEEDLE) ×1 IMPLANT
NEEDLE HYPO 25X1 1.5 SAFETY (NEEDLE) ×2 IMPLANT
NS IRRIG 500ML POUR BTL (IV SOLUTION) ×2 IMPLANT
PACK BASIN DAY SURGERY FS (CUSTOM PROCEDURE TRAY) ×2 IMPLANT
PAD CLEANER CAUTERY TIP 5X5 (MISCELLANEOUS) ×1
PENCIL BUTTON HOLSTER BLD 10FT (ELECTRODE) ×2 IMPLANT
SUPPORT SCROTAL LG STRP (MISCELLANEOUS) IMPLANT
SUT SILK 3 0 SH 30 (SUTURE) IMPLANT
SUT VIC AB 3-0 SH 27 (SUTURE) ×2
SUT VIC AB 3-0 SH 27X BRD (SUTURE) IMPLANT
SUT VIC AB 4-0 SH 27 (SUTURE) ×2
SUT VIC AB 4-0 SH 27XANBCTRL (SUTURE) IMPLANT
SUT VIC AB 5-0 P-3 18X BRD (SUTURE) IMPLANT
SUT VIC AB 5-0 P3 18 (SUTURE)
SUT VICRYL 4-0 PS2 18IN ABS (SUTURE) IMPLANT
SUT VICRYL RAPIDE 4/0 PS 2 (SUTURE) ×1 IMPLANT
SYR CONTROL 10ML LL (SYRINGE) ×2 IMPLANT
TOWEL OR 17X24 6PK STRL BLUE (TOWEL DISPOSABLE) ×4 IMPLANT
TRAY DSU PREP LF (CUSTOM PROCEDURE TRAY) ×2 IMPLANT
TUBE CONNECTING 12X1/4 (SUCTIONS) ×2 IMPLANT
YANKAUER SUCT BULB TIP NO VENT (SUCTIONS) ×2 IMPLANT

## 2011-01-24 NOTE — Interval H&P Note (Signed)
History and Physical Interval Note:  01/24/2011 7:37 AM  [name]  has presented today for surgery, with the diagnosis of LEFT HYDROCELE  The various methods of treatment have been discussed with the patient and family. After consideration of risks, benefits and other options for treatment, the patient has consented to  Procedure(s): HYDROCELECTOMY ADULT as a surgical intervention .  The patients' history has been reviewed, patient examined, no change in status, stable for surgery.  I have reviewed the patients' chart and labs.  Questions were answered to the patient's satisfaction.     Arva Slaugh S

## 2011-01-24 NOTE — Anesthesia Preprocedure Evaluation (Signed)
Anesthesia Evaluation  Patient identified by MRN, date of birth, ID band Patient awake    Reviewed: Allergy & Precautions, H&P , NPO status , Patient's Chart, lab work & pertinent test results  Airway Mallampati: II TM Distance: >3 FB Neck ROM: Full    Dental No notable dental hx.    Pulmonary neg pulmonary ROS,  clear to auscultation  Pulmonary exam normal       Cardiovascular hypertension, + CAD, + Peripheral Vascular Disease and neg cardio ROS Regular Normal    Neuro/Psych Negative Neurological ROS  Negative Psych ROS   GI/Hepatic negative GI ROS, Neg liver ROS,   Endo/Other  Negative Endocrine ROS  Renal/GU negative Renal ROS  Genitourinary negative   Musculoskeletal negative musculoskeletal ROS (+)   Abdominal   Peds negative pediatric ROS (+)  Hematology negative hematology ROS (+)   Anesthesia Other Findings   Reproductive/Obstetrics negative OB ROS                           Anesthesia Physical Anesthesia Plan  ASA: III  Anesthesia Plan: General   Post-op Pain Management:    Induction: Intravenous  Airway Management Planned: LMA  Additional Equipment:   Intra-op Plan:   Post-operative Plan:   Informed Consent: I have reviewed the patients History and Physical, chart, labs and discussed the procedure including the risks, benefits and alternatives for the proposed anesthesia with the patient or authorized representative who has indicated his/her understanding and acceptance.   Dental advisory given  Plan Discussed with: CRNA  Anesthesia Plan Comments:         Anesthesia Quick Evaluation

## 2011-01-24 NOTE — Op Note (Signed)
Preoperative diagnosis: Left testicular hydrocele  Postoperative diagnosis: Same  Procedure: Hydrocele repair  Surgeon: Valetta Fuller M.D.  Anesthesia: Gen.  Indications: Patient presented with scrotal swelling. A hydrocele was confirmed with scrotal ultrasonography. The patient was symptomatic from his hydrocele and requested surgical intervention. He appeared to understand the risks benefits potential complications of this procedure. The patient has received perioperative antibiotics and placement of PAS compression boots.  Technique and findings: Patient was brought to the operating room where he had successful induction of general anesthesia. The scrotum was then prepped and draped in the usual manner. Appropriate surgical timeout was performed. An incision was made in the median raphae of the scrotum. A tense hydrocele was encountered which was freed from the scrotal wall and then the testis and hydrocele sac were delivered from the incision. The hydrocele sac was then incised and a large amount of clear yellow fluid was obtained. The testis was carefully inspected and no other pathology was appreciated. The hydrocele sac was moderate in thickness. The sac was everted and then plicated with some 4-0 Vicryl suture. The spermatic cord block was then performed with quarter percent Marcaine. The testis was returned to the hemiscrotum taking great care to make sure that there was no twisting of the spermatic cord. The scrotal compartment was then copiously irrigated. The scrotal incision was then closed with multiple layers of Vicryl suture. A Tegaderm dressing was applied. Sponge and needle counts were correct. No obvious complications occurred and the patient was brought to recovery room in stable condition.

## 2011-01-24 NOTE — Anesthesia Procedure Notes (Signed)
Procedure Name: LMA Insertion Date/Time: 01/24/2011 7:52 AM Performed by: Huel Coventry Pre-anesthesia Checklist: Patient identified, Emergency Drugs available, Suction available and Patient being monitored Patient Re-evaluated:Patient Re-evaluated prior to inductionOxygen Delivery Method: Circle System Utilized Preoxygenation: Pre-oxygenation with 100% oxygen Intubation Type: IV induction Ventilation: Mask ventilation without difficulty LMA: LMA inserted LMA Size: 5.0 Number of attempts: 1 Airway Equipment and Method: bite block Placement Confirmation: positive ETCO2 Tube secured with: Tape Dental Injury: Teeth and Oropharynx as per pre-operative assessment

## 2011-01-24 NOTE — Transfer of Care (Signed)
Immediate Anesthesia Transfer of Care Note  Patient: [name]  Procedure(s) Performed:  HYDROCELECTOMY ADULT  Patient Location: PACU  Anesthesia Type: General  Level of Consciousness: awake, alert  and oriented  Airway & Oxygen Therapy: Patient Spontanous Breathing and Patient connected to face mask oxygen  Post-op Assessment: Report given to PACU RN and Post -op Vital signs reviewed and stable  Post vital signs: Reviewed and stable  Complications: No apparent anesthesia complications

## 2011-01-24 NOTE — Anesthesia Postprocedure Evaluation (Signed)
  Anesthesia Post-op Note  Patient: [name]  Procedure(s) Performed:  HYDROCELECTOMY ADULT  Patient Location: PACU  Anesthesia Type: General  Level of Consciousness: awake and alert   Airway and Oxygen Therapy: Patient Spontanous Breathing  Post-op Pain: mild  Post-op Assessment: Post-op Vital signs reviewed, Patient's Cardiovascular Status Stable, Respiratory Function Stable, Patent Airway and No signs of Nausea or vomiting  Post-op Vital Signs: stable  Complications: No apparent anesthesia complications

## 2011-01-25 ENCOUNTER — Encounter (HOSPITAL_BASED_OUTPATIENT_CLINIC_OR_DEPARTMENT_OTHER): Payer: Self-pay | Admitting: Urology

## 2011-02-20 ENCOUNTER — Emergency Department (HOSPITAL_COMMUNITY)
Admission: EM | Admit: 2011-02-20 | Discharge: 2011-02-20 | Disposition: A | Discharge status: Home / Self Care | Payer: Medicare Other | Attending: Emergency Medicine | Admitting: Emergency Medicine

## 2011-02-20 ENCOUNTER — Encounter (HOSPITAL_COMMUNITY): Payer: Self-pay | Admitting: *Deleted

## 2011-02-20 ENCOUNTER — Emergency Department (HOSPITAL_COMMUNITY): Payer: Medicare Other

## 2011-02-20 DIAGNOSIS — N5089 Other specified disorders of the male genital organs: Secondary | ICD-10-CM

## 2011-02-20 DIAGNOSIS — I10 Essential (primary) hypertension: Secondary | ICD-10-CM | POA: Insufficient documentation

## 2011-02-20 DIAGNOSIS — I2510 Atherosclerotic heart disease of native coronary artery without angina pectoris: Secondary | ICD-10-CM | POA: Insufficient documentation

## 2011-02-20 DIAGNOSIS — E785 Hyperlipidemia, unspecified: Secondary | ICD-10-CM | POA: Insufficient documentation

## 2011-02-20 DIAGNOSIS — Z79899 Other long term (current) drug therapy: Secondary | ICD-10-CM | POA: Insufficient documentation

## 2011-02-20 DIAGNOSIS — N433 Hydrocele, unspecified: Secondary | ICD-10-CM | POA: Insufficient documentation

## 2011-02-20 DIAGNOSIS — Z9889 Other specified postprocedural states: Secondary | ICD-10-CM | POA: Insufficient documentation

## 2011-02-20 DIAGNOSIS — Z9861 Coronary angioplasty status: Secondary | ICD-10-CM | POA: Insufficient documentation

## 2011-02-20 DIAGNOSIS — N509 Disorder of male genital organs, unspecified: Secondary | ICD-10-CM | POA: Insufficient documentation

## 2011-02-20 DIAGNOSIS — M129 Arthropathy, unspecified: Secondary | ICD-10-CM | POA: Insufficient documentation

## 2011-02-20 DIAGNOSIS — T148XXA Other injury of unspecified body region, initial encounter: Secondary | ICD-10-CM | POA: Insufficient documentation

## 2011-02-20 DIAGNOSIS — X58XXXA Exposure to other specified factors, initial encounter: Secondary | ICD-10-CM | POA: Insufficient documentation

## 2011-02-20 LAB — URINALYSIS, ROUTINE W REFLEX MICROSCOPIC
Bilirubin Urine: NEGATIVE
Hgb urine dipstick: NEGATIVE
Nitrite: NEGATIVE
Specific Gravity, Urine: 1.022 (ref 1.005–1.030)
Urobilinogen, UA: 0.2 mg/dL (ref 0.0–1.0)
pH: 7 (ref 5.0–8.0)

## 2011-02-20 MED ORDER — HYDROMORPHONE HCL PF 1 MG/ML IJ SOLN
1.0000 mg | Freq: Once | INTRAMUSCULAR | Status: AC
  Administered 2011-02-20: 1 mg via INTRAMUSCULAR
  Filled 2011-02-20: qty 1

## 2011-02-20 MED ORDER — SULFAMETHOXAZOLE-TRIMETHOPRIM 800-160 MG PO TABS: 1.0000 | ORAL_TABLET | Freq: Two times a day (BID) | ORAL | Status: AC

## 2011-02-20 MED ORDER — HYDROCODONE-ACETAMINOPHEN 5-500 MG PO TABS: 1.0000 | ORAL_TABLET | Freq: Four times a day (QID) | ORAL | Status: AC | PRN

## 2011-02-20 NOTE — Consult Note (Signed)
Reason for Consult:Post-op Scrotal Swelling / Drainage Referring Physician: ER Physician  [name] is an 71 y.o. male.  HPI:   1 - Scrotal Swelling - pt s/p left hydrocelectomy 01/24/2011 for symptomatic hydrocele. Has had persistent bothersome scrotal swelling post-op, with persistent mild drainage. Pain is 5/10, relieved by position and pain meds, non-radiating. He called PAL line last PM and was very worried about infection and chose to come to ER today for evaluation. Denies fever, chills, malaise, or voiding complaints. Denies lower abdominal or groin pain.  Ultrasound today with complex left fluid collection c/w probable hematoma, but normal vascularity to bilateral testes.  He is on ASA81, but denies coumadin / plavix / other blood thinners.  Past Medical History  Diagnosis Date  . Hypertension   . Heart murmur   . Nocturia   . Hyperlipidemia   . Arthritis     HANDS AND FEET  . Cataract immature BILATERAL   . Hydrocele, left   . History of AAA (abdominal aortic aneurysm) repair 2000    W/ AORTOVIFEMORAL BYPASS  . Asymptomatic carotid artery stenosis BILATERAL     PER DOPPLER  01-05-10   RICA  1 - 39%/   LICA  40 - 59%  . Coronary artery disease CARDIOLOGIST- DR Gloris [place] TURNER-- LAST VISIT NOV  2012-- WILL REQUEST NOTE AND STRESS TEST  . Peripheral vascular disease FOLLOWED BY DR EARLY-- VISIT NOTE 01-05-10 AND CAROTID / VASCULAR DOPPLER  RESULTS W/ CHART    ACTIVE WALKING PROGRAM  . Status post primary angioplasty with coronary stent 2002--  X2 BM STENTS  . Popliteal artery aneurysm LEFT    Past Surgical History  Procedure Date  . Coronary angioplasty 2002    PTCA  W/ X2 BM STENTS--   PROXIMA LAD  &  LEFT CIRCUMFLEX TO FIRST OBTUSE MARGINAL BRANCH  . Aaa repair w/ aortobifemoral bypass OCT  40981  . Pulley release -- right 5th finger 2009  . Exploratory laparotomy w/ enterolysis for partial sbo 09-23-2008  . Retinal detachment surgery 2004    BILATERAL  .  Abdominal hernia repair X4  (LAST ONE 1990'S)  . Hydrocele excision 01/24/2011    Procedure: HYDROCELECTOMY ADULT;  Surgeon: Valetta Fuller, MD;  Location: [facility];  Service: Urology;  Laterality: Left;    History reviewed. No pertinent family history.  Social History:  reports that he quit smoking about 41 years ago. His smoking use included Cigarettes. He quit after 30 years of use. He has never used smokeless tobacco. He reports that he drinks about 10.5 ounces of alcohol per week. He reports that he does not use illicit drugs.  Allergies: No Known Allergies  Medications: I have reviewed the patient's current medications.  Results for orders placed during the hospital encounter of 02/20/11 (from the past 48 hour(s))  URINALYSIS, ROUTINE W REFLEX MICROSCOPIC     Status: Abnormal   Collection Time   02/20/11  9:36 AM      Component Value Range Comment   Color, Urine YELLOW  YELLOW     APPearance CLEAR  CLEAR     Specific Gravity, Urine 1.022  1.005 - 1.030     pH 7.0  5.0 - 8.0     Glucose, UA NEGATIVE  NEGATIVE (mg/dL)    Hgb urine dipstick NEGATIVE  NEGATIVE     Bilirubin Urine NEGATIVE  NEGATIVE     Ketones, ur TRACE (*) NEGATIVE (mg/dL)    Protein, ur NEGATIVE  NEGATIVE (mg/dL)    Urobilinogen, UA 0.2  0.0 - 1.0 (mg/dL)    Nitrite NEGATIVE  NEGATIVE     Leukocytes, UA NEGATIVE  NEGATIVE  MICROSCOPIC NOT DONE ON URINES WITH NEGATIVE PROTEIN, BLOOD, LEUKOCYTES, NITRITE, OR GLUCOSE <1000 mg/dL.    US Scrotum  02/20/2011  *RADIOLOGY REPORT*  Clinical Data:  Testicular pain.  Recent hydrocele repair.  SCROTAL ULTRASOUND DOPPLER ULTRASOUND OF THE TESTICLES  Technique: Complete ultrasound examination of the testicles, epididymis, and other scrotal structures was performed.  Color and spectral Doppler ultrasound were also utilized to evaluate blood flow to the testicles.  Comparison:  Scrotal ultrasound performed at [facility] on 04/16/2010  Findings:  Right testis:   4.4 x 2.6 x 3.3 cm.  No evidence of testicular mass or microlithiasis.  Internal blood flow is seen on color Doppler ultrasound.  Left testis:  3.5 x 2.5 x 2.8 cm.  No evidence of testicular mass or microlithiasis.  Internal blood flow is seen on color Doppler ultrasound.  Right epididymis:  Normal in size and appearance.  Left epididymis:  Normal in size and appearance.  Hydocele:  A large left hydrocele is again seen, but now has a very complex appearance with numerous internal septations and echogenic debris.  There is overlying scrotal wall soft tissue thickening. This may be due to hemorrhage or infection.  A moderate right-sided hydrocele is again seen, but remains simple in appearance and not significant changed in size or appearance.  Varicocele:  None visualized  Pulsed Doppler interrogation of both testes demonstrates low resistance flow bilaterally.  IMPRESSION:  1.  No evidence of testicular mass or torsion. 2.  Persistent large left hydrocele, which now has a very complex appearance with overlying scrotal wall thickening.  This may be due to complication by hemorrhage or infection. 3.  Stable moderate simple hydrocele on the right.  Original Report Authenticated By: Danae Orleans, M.D.   [place] Art/ven Flow Abd Pelv Doppler  02/20/2011  *RADIOLOGY REPORT*  Clinical Data:  Testicular pain.  Recent hydrocele repair.  SCROTAL ULTRASOUND DOPPLER ULTRASOUND OF THE TESTICLES  Technique: Complete ultrasound examination of the testicles, epididymis, and other scrotal structures was performed.  Color and spectral Doppler ultrasound were also utilized to evaluate blood flow to the testicles.  Comparison:  Scrotal ultrasound performed at [facility] on 04/16/2010  Findings:  Right testis:  4.4 x 2.6 x 3.3 cm.  No evidence of testicular mass or microlithiasis.  Internal blood flow is seen on color Doppler ultrasound.  Left testis:  3.5 x 2.5 x 2.8 cm.  No evidence of testicular mass or microlithiasis.  Internal  blood flow is seen on color Doppler ultrasound.  Right epididymis:  Normal in size and appearance.  Left epididymis:  Normal in size and appearance.  Hydocele:  A large left hydrocele is again seen, but now has a very complex appearance with numerous internal septations and echogenic debris.  There is overlying scrotal wall soft tissue thickening. This may be due to hemorrhage or infection.  A moderate right-sided hydrocele is again seen, but remains simple in appearance and not significant changed in size or appearance.  Varicocele:  None visualized  Pulsed Doppler interrogation of both testes demonstrates low resistance flow bilaterally.  IMPRESSION:  1.  No evidence of testicular mass or torsion. 2.  Persistent large left hydrocele, which now has a very complex appearance with overlying scrotal wall thickening.  This may be due to complication by hemorrhage or infection.  3.  Stable moderate simple hydrocele on the right.  Original Report Authenticated By: Danae Orleans, M.D.    Review of Systems  Constitutional: Negative.  Negative for fever, chills and malaise/fatigue.  HENT: Negative.   Eyes: Negative.   Respiratory: Negative.   Cardiovascular: Negative.   Gastrointestinal: Negative.   Genitourinary: Negative for dysuria, urgency, frequency, hematuria and flank pain.       Scrotal swelling and drainage  Musculoskeletal: Negative.   Skin: Negative.   Neurological: Negative.   Endo/Heme/Allergies: Negative.   Psychiatric/Behavioral: Negative.    Blood pressure 132/69, pulse 96, temperature 97.6 F (36.4 C), temperature source Oral, resp. rate 18, height 6\' 2"  (1.88 m), weight 86.183 kg (190 lb), SpO2 98.00%. Physical Exam  Constitutional: He appears well-developed and well-nourished.       In [place] [facility] bay 11 with wife  HENT:  Head: Normocephalic and atraumatic.  Eyes: EOM are normal. Pupils are equal, round, and reactive to light.  Neck: Normal range of motion. Neck supple.    Cardiovascular: Normal rate and regular rhythm.   Respiratory: Effort normal.  GI: Soft. Bowel sounds are normal.  Genitourinary: Penis normal.       Scrotum with left sided swelling and mild skin erythyma. Serous drainage (small amt) from inferior aspect of old incision. Sutures in place. Rt testis palpably normal, Lt testis non palpable  Musculoskeletal: Normal range of motion.  Neurological: He is alert. He has normal reflexes.  Skin: Skin is warm and dry.  Psychiatric: He has a normal mood and affect. His behavior is normal. Judgment and thought content normal.    Assessment/Plan: 1 - Scrotal Swelling - Pt without symptoms of systemic or advanced local infection. Clinical and radiologic picture most consistent with slowly resolving hematoma, perhaps with small amt of overlying cellulitis. No evidence of fascitis. No evidence of testicular compromise or orchitis. Discussed natural history with patient with course of slow resolution vs. Surgical exploration for evacuation which can speed up process some. Pt leaning towards surgical evacuation but wants to discuss with primary urologist first in effort to perform in the elective setting if at all. This is reasonable.  2 - Emperic Bactrim, Refill Vicodin, F/U as outpatient.   [name] 02/20/2011, 11:59 AM

## 2011-02-20 NOTE — ED Notes (Signed)
Pt has enlarged scrotum, red and edematous. Pt's states pain is 10/10

## 2011-02-20 NOTE — ED Notes (Signed)
Pt given discharge instructions and verb understanding, amb with steady gait to discharge window 

## 2011-02-20 NOTE — ED Notes (Signed)
Pt from home c/o increased testicular pain, swelling since Tuesday and needs incision checked from a procedure done by Urologist on 01/24/11 to relieve swelling and pain. Pt denies fever, nausea and vomiting.

## 2011-02-20 NOTE — ED Provider Notes (Addendum)
History     CSN: [id number]  Arrival date & time 02/20/11  3875   First MD Initiated Contact with Patient 02/20/11 [phone]      Chief Complaint  Patient presents with  . Wound Check  . Testicle Pain    (Consider location/radiation/quality/duration/timing/severity/associated sxs/prior treatment) Patient is a 71 y.o. male presenting with wound check and testicular pain. The history is provided by the patient.  Wound Check   Testicle Pain Pertinent negatives include no abdominal pain and no shortness of breath.  01/24/11 pt had hydrocele removal/repair (Grapey). States since surgery the scrotum has remained swollen (roughly size of large orange), and has had persistent drainage (clear to sl yellowish) from incision. States he golfed last week and noted drainage increased afterwards. Went to urologist x 2 this past week, was told normal for post operative period. States drainage persists. States scrotum mildly sore, but no abrupt or acute increase in pain/discomfort. No fever or chills. No abd pain. No trouble urinating. Symptoms persistent, no specific exacerbating or alleviating factors.   Past Medical History  Diagnosis Date  . Hypertension   . Heart murmur   . Nocturia   . Hyperlipidemia   . Arthritis     HANDS AND FEET  . Cataract immature BILATERAL   . Hydrocele, left   . History of AAA (abdominal aortic aneurysm) repair 2000    W/ AORTOVIFEMORAL BYPASS  . Asymptomatic carotid artery stenosis BILATERAL     PER DOPPLER  01-05-10   RICA  1 - 39%/   LICA  40 - 59%  . Coronary artery disease CARDIOLOGIST- DR Gloris [place] TURNER-- LAST VISIT NOV  2012-- WILL REQUEST NOTE AND STRESS TEST  . Peripheral vascular disease FOLLOWED BY DR EARLY-- VISIT NOTE 01-05-10 AND CAROTID / VASCULAR DOPPLER  RESULTS W/ CHART    ACTIVE WALKING PROGRAM  . Status post primary angioplasty with coronary stent 2002--  X2 BM STENTS  . Popliteal artery aneurysm LEFT    Past Surgical History  Procedure Date  .  Coronary angioplasty 2002    PTCA  W/ X2 BM STENTS--   PROXIMA LAD  &  LEFT CIRCUMFLEX TO FIRST OBTUSE MARGINAL BRANCH  . Aaa repair w/ aortobifemoral bypass OCT  29518  . Pulley release -- right 5th finger 2009  . Exploratory laparotomy w/ enterolysis for partial sbo 09-23-2008  . Retinal detachment surgery 2004    BILATERAL  . Abdominal hernia repair X4  (LAST ONE 1990'S)  . Hydrocele excision 01/24/2011    Procedure: HYDROCELECTOMY ADULT;  Surgeon: Valetta Fuller, MD;  Location: [facility];  Service: Urology;  Laterality: Left;    History reviewed. No pertinent family history.  History  Substance Use Topics  . Smoking status: Former Smoker -- 30 years    Types: Cigarettes    Quit date: 01/18/1970  . Smokeless tobacco: Never Used  . Alcohol Use: 10.5 oz/week    21 drink(s) per week      Review of Systems  Constitutional: Negative for fever and chills.  Respiratory: Negative for shortness of breath.   Gastrointestinal: Negative for nausea, vomiting, abdominal pain and abdominal distention.  Genitourinary: Positive for scrotal swelling. Negative for dysuria and flank pain.    Allergies  Review of patient's allergies indicates no known allergies.  Home Medications   Current Outpatient Rx  Name Route Sig Dispense Refill  . B COMPLEX PO TABS Oral Take 1 tablet by mouth daily.      [name] CALCIUM CARBONATE  600 MG PO TABS Oral Take 600 mg by mouth daily.      [name] VITAMIN D 1000 UNITS PO TABS Oral Take 1,000 Units by mouth daily.      [name] CINNAMON PO Oral Take 1 tablet by mouth daily.      [name] COENZYME Q10 30 MG PO CAPS Oral Take 100 mg by mouth daily.      [name] EZETIMIBE 10 MG PO TABS Oral Take 10 mg by mouth daily.      [name] HYDROCHLOROTHIAZIDE 25 MG PO TABS Oral Take 25 mg by mouth daily.      [name] ONE-DAILY MULTI VITAMINS PO TABS Oral Take 1 tablet by mouth daily.      [name] RAMIPRIL 10 MG PO TABS Oral Take 10 mg by mouth daily.      [name] ROSUVASTATIN CALCIUM 20 MG PO TABS Oral Take  20 mg by mouth daily.      [name] TAMSULOSIN HCL 0.4 MG PO CAPS Oral Take 0.4 mg by mouth daily.        BP 132/69  Pulse 96  Temp(Src) 97.6 F (36.4 C) (Oral)  Resp 18  Ht 6\' 2"  (1.88 m)  Wt 190 lb (86.183 kg)  BMI 24.39 kg/m2  SpO2 98%  Physical Exam  Nursing note and vitals reviewed. Constitutional: He is oriented to person, place, and time. He appears well-developed and well-nourished. No distress.  HENT:  Head: Atraumatic.  Eyes: Pupils are equal, round, and reactive to light.  Neck: Neck supple. No tracheal deviation present.  Cardiovascular: Normal rate.   Pulmonary/Chest: Effort normal. No accessory muscle usage. No respiratory distress.  Abdominal: Soft. He exhibits no distension and no mass. There is no tenderness. There is no rebound and no guarding.  Genitourinary: Penis normal.       Left scrotum diffusely swollen, slightly tender, mildly erythematous. Mild crusty yellow drainage from midportion of scrotal incision. No fluctuance.   Musculoskeletal: Normal range of motion.  Neurological: He is alert and oriented to person, place, and time.  Skin: Skin is warm and dry.  Psychiatric: He has a normal mood and affect.    ED Course  Procedures (including critical care time)   Labs Reviewed  URINALYSIS, ROUTINE W REFLEX MICROSCOPIC   Results for orders placed during the hospital encounter of 02/20/11  URINALYSIS, ROUTINE W REFLEX MICROSCOPIC      Component Value Range   Color, Urine YELLOW  YELLOW    APPearance CLEAR  CLEAR    Specific Gravity, Urine 1.022  1.005 - 1.030    pH 7.0  5.0 - 8.0    Glucose, UA NEGATIVE  NEGATIVE (mg/dL)   Hgb urine dipstick NEGATIVE  NEGATIVE    Bilirubin Urine NEGATIVE  NEGATIVE    Ketones, ur TRACE (*) NEGATIVE (mg/dL)   Protein, ur NEGATIVE  NEGATIVE (mg/dL)   Urobilinogen, UA 0.2  0.0 - 1.0 (mg/dL)   Nitrite NEGATIVE  NEGATIVE    Leukocytes, UA NEGATIVE  NEGATIVE    US Scrotum  02/20/2011  *RADIOLOGY REPORT*  Clinical Data:   Testicular pain.  Recent hydrocele repair.  SCROTAL ULTRASOUND DOPPLER ULTRASOUND OF THE TESTICLES  Technique: Complete ultrasound examination of the testicles, epididymis, and other scrotal structures was performed.  Color and spectral Doppler ultrasound were also utilized to evaluate blood flow to the testicles.  Comparison:  Scrotal ultrasound performed at [facility] on 04/16/2010  Findings:  Right testis:  4.4 x 2.6 x 3.3 cm.  No evidence of testicular  mass or microlithiasis.  Internal blood flow is seen on color Doppler ultrasound.  Left testis:  3.5 x 2.5 x 2.8 cm.  No evidence of testicular mass or microlithiasis.  Internal blood flow is seen on color Doppler ultrasound.  Right epididymis:  Normal in size and appearance.  Left epididymis:  Normal in size and appearance.  Hydocele:  A large left hydrocele is again seen, but now has a very complex appearance with numerous internal septations and echogenic debris.  There is overlying scrotal wall soft tissue thickening. This may be due to hemorrhage or infection.  A moderate right-sided hydrocele is again seen, but remains simple in appearance and not significant changed in size or appearance.  Varicocele:  None visualized  Pulsed Doppler interrogation of both testes demonstrates low resistance flow bilaterally.  IMPRESSION:  1.  No evidence of testicular mass or torsion. 2.  Persistent large left hydrocele, which now has a very complex appearance with overlying scrotal wall thickening.  This may be due to complication by hemorrhage or infection. 3.  Stable moderate simple hydrocele on the right.  Original Report Authenticated By: Danae Orleans, M.D.   [place] Art/ven Flow Abd Pelv Doppler  02/20/2011  *RADIOLOGY REPORT*  Clinical Data:  Testicular pain.  Recent hydrocele repair.  SCROTAL ULTRASOUND DOPPLER ULTRASOUND OF THE TESTICLES  Technique: Complete ultrasound examination of the testicles, epididymis, and other scrotal structures was performed.  Color  and spectral Doppler ultrasound were also utilized to evaluate blood flow to the testicles.  Comparison:  Scrotal ultrasound performed at [facility] on 04/16/2010  Findings:  Right testis:  4.4 x 2.6 x 3.3 cm.  No evidence of testicular mass or microlithiasis.  Internal blood flow is seen on color Doppler ultrasound.  Left testis:  3.5 x 2.5 x 2.8 cm.  No evidence of testicular mass or microlithiasis.  Internal blood flow is seen on color Doppler ultrasound.  Right epididymis:  Normal in size and appearance.  Left epididymis:  Normal in size and appearance.  Hydocele:  A large left hydrocele is again seen, but now has a very complex appearance with numerous internal septations and echogenic debris.  There is overlying scrotal wall soft tissue thickening. This may be due to hemorrhage or infection.  A moderate right-sided hydrocele is again seen, but remains simple in appearance and not significant changed in size or appearance.  Varicocele:  None visualized  Pulsed Doppler interrogation of both testes demonstrates low resistance flow bilaterally.  IMPRESSION:  1.  No evidence of testicular mass or torsion. 2.  Persistent large left hydrocele, which now has a very complex appearance with overlying scrotal wall thickening.  This may be due to complication by hemorrhage or infection. 3.  Stable moderate simple hydrocele on the right.  Original Report Authenticated By: Danae Orleans, M.D.      MDM  Pts urologist called. Reviewed recent op note, nursing comments.   Discussed w Dr Berneice Heinrich, will see in ed, requests u/s.   Urologist to see in ed.  Dr Berneice Heinrich has seen in ED and reviewed u/s.  He indicates to d/c pt to home, to give pt rx for bactrim ds, and vicodin, he will arrange for Dr Isabel Caprice to follow up in next 1-2 days, with plan for him to take pt back to OR for exploration, evacuation of fluid collection, etc.   Confirmed nkda w pt.   Suzi Roots, MD 02/20/11 1157  Suzi Roots, MD 02/20/11  [phone]

## 2011-07-20 ENCOUNTER — Other Ambulatory Visit: Payer: Self-pay | Admitting: *Deleted

## 2011-07-20 DIAGNOSIS — I724 Aneurysm of artery of lower extremity: Secondary | ICD-10-CM

## 2011-08-19 ENCOUNTER — Other Ambulatory Visit: Payer: Self-pay | Admitting: Vascular Surgery

## 2011-08-19 LAB — CREATININE, SERUM: Creat: 0.8 mg/dL (ref 0.50–1.35)

## 2011-08-19 LAB — BUN: BUN: 20 mg/dL (ref 6–23)

## 2011-08-22 ENCOUNTER — Other Ambulatory Visit: Payer: Self-pay | Admitting: *Deleted

## 2011-08-22 ENCOUNTER — Encounter: Payer: Self-pay | Admitting: Vascular Surgery

## 2011-08-22 DIAGNOSIS — I6529 Occlusion and stenosis of unspecified carotid artery: Secondary | ICD-10-CM

## 2011-08-23 ENCOUNTER — Ambulatory Visit (INDEPENDENT_AMBULATORY_CARE_PROVIDER_SITE_OTHER): Payer: Medicare Other | Admitting: Vascular Surgery

## 2011-08-23 ENCOUNTER — Ambulatory Visit
Admission: RE | Admit: 2011-08-23 | Discharge: 2011-08-23 | Disposition: A | Discharge status: Home / Self Care | Payer: Medicare Other | Source: Ambulatory Visit | Attending: Vascular Surgery | Admitting: Vascular Surgery

## 2011-08-23 ENCOUNTER — Other Ambulatory Visit (INDEPENDENT_AMBULATORY_CARE_PROVIDER_SITE_OTHER): Payer: Medicare Other | Admitting: *Deleted

## 2011-08-23 ENCOUNTER — Encounter: Payer: Self-pay | Admitting: Vascular Surgery

## 2011-08-23 VITALS — BP 150/75 | HR 53 | Resp 18 | Ht 73.5 in | Wt 192.2 lb

## 2011-08-23 DIAGNOSIS — I724 Aneurysm of artery of lower extremity: Secondary | ICD-10-CM

## 2011-08-23 DIAGNOSIS — I6529 Occlusion and stenosis of unspecified carotid artery: Secondary | ICD-10-CM

## 2011-08-23 MED ORDER — IOHEXOL 350 MG/ML SOLN
150.0000 mL | Freq: Once | INTRAVENOUS | Status: AC | PRN
  Administered 2011-08-23: 150 mL via INTRAVENOUS

## 2011-08-23 NOTE — Addendum Note (Signed)
Addended by: Sharee Pimple on: 08/23/2011 02:29 PM   Modules accepted: Orders

## 2011-08-23 NOTE — Progress Notes (Addendum)
Presents today for followup of his diffuse peripheral vascular occlusive disease. He is extremely active playing golf several times per week and the exercise by yardwork. He has no neurologic deficits. Specifically he has no amaurosis fugax transient attack or stroke. He does have stable nonlimiting bilateral calf claudication due to his known peripheral vascular occlusive disease. He does have a history of prior aneurysm repair with open repair in the year 2000. He has small popliteal artery aneurysms bilaterally. He is here today for duplex followup of his carotids and CT angiogram evaluation for his peripheral disease.  Past Medical History  Diagnosis Date  . Hypertension   . Heart murmur   . Nocturia   . Hyperlipidemia   . Arthritis     HANDS AND FEET  . Cataract immature BILATERAL   . Hydrocele, left   . History of AAA (abdominal aortic aneurysm) repair 2000    W/ AORTOVIFEMORAL BYPASS  . Asymptomatic carotid artery stenosis BILATERAL     PER DOPPLER  01-05-10   RICA  1 - 39%/   LICA  40 - 59%  . Coronary artery disease CARDIOLOGIST- DR Gloris Manchester TURNER-- LAST VISIT NOV  2012-- WILL REQUEST NOTE AND STRESS TEST  . Peripheral vascular disease FOLLOWED BY DR Kylah Maresh-- VISIT NOTE 01-05-10 AND CAROTID / VASCULAR DOPPLER  RESULTS W/ CHART    ACTIVE WALKING PROGRAM  . Status post primary angioplasty with coronary stent 2002--  X2 BM STENTS  . Popliteal artery aneurysm LEFT    History  Substance Use Topics  . Smoking status: Former Smoker -- 30 years    Types: Cigarettes    Quit date: 01/18/1970  . Smokeless tobacco: Never Used  . Alcohol Use: 10.5 oz/week    21 drink(s) per week    Family History  Problem Relation Age of Onset  . Heart disease Mother   . Cancer Mother 53    Colon cancer    No Known Allergies  Current outpatient prescriptions:aspirin EC 81 MG tablet, Take 81 mg by mouth daily., Disp: , Rfl: ;  b complex vitamins tablet, Take 1 tablet by mouth daily.  , Disp: , Rfl:  ;  calcium carbonate (OS-CAL) 600 MG TABS, Take 600 mg by mouth daily.  , Disp: , Rfl: ;  cholecalciferol (VITAMIN D) 1000 UNITS tablet, Take 1,000 Units by mouth daily.  , Disp: , Rfl: ;  co-enzyme Q-10 30 MG capsule, Take 100 mg by mouth every evening. , Disp: , Rfl:  ezetimibe (ZETIA) 10 MG tablet, Take 10 mg by mouth every evening. , Disp: , Rfl: ;  hydrochlorothiazide (HYDRODIURIL) 25 MG tablet, Take 25 mg by mouth daily.  , Disp: , Rfl: ;  HYDROcodone-acetaminophen (NORCO) 5-325 MG per tablet, Take 1-2 tablets by mouth every 6 (six) hours as needed. For pain., Disp: , Rfl: ;  ibuprofen (ADVIL,MOTRIN) 200 MG tablet, Take 600 mg by mouth every 6 (six) hours as needed. For pain., Disp: , Rfl:  Multiple Vitamin (MULTIVITAMIN) tablet, Take 1 tablet by mouth daily.  , Disp: , Rfl: ;  ramipril (ALTACE) 10 MG tablet, Take 10 mg by mouth daily.  , Disp: , Rfl: ;  rosuvastatin (CRESTOR) 20 MG tablet, Take 20 mg by mouth every evening. , Disp: , Rfl: ;  Tamsulosin HCl (FLOMAX) 0.4 MG CAPS, Take 0.4 mg by mouth daily.  , Disp: , Rfl:  PRESCRIPTION MEDICATION, Apply 1 application topically daily. Mometasone 1% Solution. Applies to back of neck for acne after showering., Disp: ,  Rfl:  No current facility-administered medications for this visit. Facility-Administered Medications Ordered in Other Visits: iohexol (OMNIPAQUE) 350 MG/ML injection 150 mL, 150 mL, Intravenous, Once PRN, Medication Radiologist, MD, 150 mL at 08/23/11 1032  BP 150/75  Pulse 53  Resp 18  Ht 6' 1.5" (1.867 m)  Wt 192 lb 3.2 oz (87.181 kg)  BMI 25.01 kg/m2  SpO2 100%  Body mass index is 25.01 kg/(m^2).       Review of systems is reported as claudication otherwise completely negative.  Physical exam ON well-developed well-nourished white male appearing stated age of 40. Neurologically is grossly intact Heart regular rate and rhythm without murmur Chest clear with equal breath sounds bilaterally Abdomen soft nontender no  hernia noted well-healed midline incision Pulse status 2+ radial 2+ femoral pulses. Absent popliteal and distal pulses. Skin without ulcers or rashes Musculoskeletal without deformities or cyanosis  Carotid duplex in our office today reveals no significant right carotid stenosis, 40-59% left internal carotid artery stenosis  CT angiogram of the abdomen and lower extremity runoff today reveals stable repair of his abdominal aortic aneurysm he does have no cystoscopy superficial arteries bilaterally and a short segment occlusion distally. There is no change and a small popliteal artery aneurysms bilaterally. He does have tibial vessel disease in his runoff  Impression and plan: Stable vascular status. Patient will remains quite active and will continue his activity. We will see him again in one year with a duplex of his popliteal arteries rule out expansion of the popliteal aneurysms and a carotid duplex followup of his left carotid stenosis. He'll  notify us should he develop any difficulties

## 2011-08-30 ENCOUNTER — Encounter (INDEPENDENT_AMBULATORY_CARE_PROVIDER_SITE_OTHER): Payer: Medicare Other | Admitting: Ophthalmology

## 2011-08-30 DIAGNOSIS — H35039 Hypertensive retinopathy, unspecified eye: Secondary | ICD-10-CM

## 2011-08-30 DIAGNOSIS — H43819 Vitreous degeneration, unspecified eye: Secondary | ICD-10-CM

## 2011-08-30 DIAGNOSIS — H2510 Age-related nuclear cataract, unspecified eye: Secondary | ICD-10-CM

## 2011-08-30 DIAGNOSIS — H33009 Unspecified retinal detachment with retinal break, unspecified eye: Secondary | ICD-10-CM

## 2011-08-30 DIAGNOSIS — I10 Essential (primary) hypertension: Secondary | ICD-10-CM

## 2011-10-18 HISTORY — PX: CARPAL TUNNEL RELEASE: SHX101

## 2011-12-18 HISTORY — PX: TOOTH EXTRACTION: SUR596

## 2012-08-20 ENCOUNTER — Encounter: Payer: Self-pay | Admitting: Vascular Surgery

## 2012-08-21 ENCOUNTER — Encounter: Payer: Self-pay | Admitting: Vascular Surgery

## 2012-08-21 ENCOUNTER — Ambulatory Visit (INDEPENDENT_AMBULATORY_CARE_PROVIDER_SITE_OTHER): Payer: Medicare Other | Admitting: Vascular Surgery

## 2012-08-21 ENCOUNTER — Other Ambulatory Visit (INDEPENDENT_AMBULATORY_CARE_PROVIDER_SITE_OTHER): Payer: Medicare Other | Admitting: *Deleted

## 2012-08-21 ENCOUNTER — Other Ambulatory Visit: Payer: Self-pay | Admitting: *Deleted

## 2012-08-21 ENCOUNTER — Encounter (INDEPENDENT_AMBULATORY_CARE_PROVIDER_SITE_OTHER): Payer: Medicare Other | Admitting: *Deleted

## 2012-08-21 VITALS — BP 135/68 | HR 48 | Resp 16 | Ht 73.75 in | Wt 191.5 lb

## 2012-08-21 DIAGNOSIS — I6529 Occlusion and stenosis of unspecified carotid artery: Secondary | ICD-10-CM

## 2012-08-21 DIAGNOSIS — I724 Aneurysm of artery of lower extremity: Secondary | ICD-10-CM

## 2012-08-21 NOTE — Progress Notes (Signed)
The patient presents today for followup of his diffuse peripheral vascular occlusive disease. He is status post resection of a large 7/2 cm abdominal aortic aneurysm in 2000. We have followed asymptomatic disease for quite a few years. He does have no symptoms related to related to his carotid stenosis. He does have what seems like claudication symptoms although he does have normal pedal pulses. He does have known small bilateral popliteal artery aneurysms. He is her today for carotid duplex and duplex of his popliteal arteries to rule out enlargement. He remains quite active. He is on walking program and is very physically fit.  Past Medical History  Diagnosis Date  . Hypertension   . Heart murmur   . Nocturia   . Hyperlipidemia   . Arthritis     HANDS AND FEET  . Cataract immature BILATERAL   . Hydrocele, left   . History of AAA (abdominal aortic aneurysm) repair 2000    W/ AORTOVIFEMORAL BYPASS  . Asymptomatic carotid artery stenosis BILATERAL     PER DOPPLER  01-05-10   RICA  1 - 39%/   LICA  40 - 59%  . Coronary artery disease CARDIOLOGIST- DR Gloris Manchester TURNER-- LAST VISIT NOV  2012-- WILL REQUEST NOTE AND STRESS TEST  . Peripheral vascular disease FOLLOWED BY DR Azam Gervasi-- VISIT NOTE 01-05-10 AND CAROTID / VASCULAR DOPPLER  RESULTS W/ CHART    ACTIVE WALKING PROGRAM  . Status post primary angioplasty with coronary stent 2002--  X2 BM STENTS  . Popliteal artery aneurysm LEFT    History  Substance Use Topics  . Smoking status: Former Smoker -- 30 years    Types: Cigarettes    Quit date: 01/18/1970  . Smokeless tobacco: Never Used  . Alcohol Use: 10.5 oz/week    21 drink(s) per week    Family History  Problem Relation Age of Onset  . Heart disease Mother   . Cancer Mother 69    Colon cancer    Allergies  Allergen Reactions  . Wasp Venom Shortness Of Breath and Swelling    Current outpatient prescriptions:aspirin EC 81 MG tablet, Take 81 mg by mouth daily., Disp: , Rfl: ;  b  complex vitamins tablet, Take 1 tablet by mouth daily.  , Disp: , Rfl: ;  calcium carbonate (OS-CAL) 600 MG TABS, Take 600 mg by mouth daily.  , Disp: , Rfl: ;  cholecalciferol (VITAMIN D) 1000 UNITS tablet, Take 1,000 Units by mouth daily.  , Disp: , Rfl: ;  co-enzyme Q-10 30 MG capsule, Take 100 mg by mouth every evening. , Disp: , Rfl:  hydrochlorothiazide (HYDRODIURIL) 25 MG tablet, Take 25 mg by mouth daily.  , Disp: , Rfl: ;  ibuprofen (ADVIL,MOTRIN) 200 MG tablet, Take 600 mg by mouth every 6 (six) hours as needed. For pain., Disp: , Rfl: ;  Magnesium Hydroxide (MAGNESIA PO), Take 1,000 mg by mouth as needed., Disp: , Rfl: ;  Multiple Vitamin (MULTIVITAMIN) tablet, Take 1 tablet by mouth daily.  , Disp: , Rfl: ;  NIACINAMIDE PO, Take 1,000 mg by mouth daily., Disp: , Rfl:  Omega-3 Fatty Acids (FISH OIL) 1200 MG CAPS, Take by mouth daily., Disp: , Rfl: ;  PRESCRIPTION MEDICATION, Apply 1 application topically daily. Mometasone 1% Solution. Applies to back of neck for acne after showering., Disp: , Rfl: ;  ramipril (ALTACE) 10 MG tablet, Take 10 mg by mouth daily.  , Disp: , Rfl: ;  rosuvastatin (CRESTOR) 20 MG tablet, Take 20 mg by  mouth every evening. , Disp: , Rfl:  Tamsulosin HCl (FLOMAX) 0.4 MG CAPS, Take 0.4 mg by mouth daily.  , Disp: , Rfl: ;  VIT C-CHOLECALCIFEROL-ROSE HIP PO, Take 2,000 mg by mouth daily., Disp: , Rfl: ;  ezetimibe (ZETIA) 10 MG tablet, Take 10 mg by mouth every evening. , Disp: , Rfl: ;  HYDROcodone-acetaminophen (NORCO) 5-325 MG per tablet, Take 1-2 tablets by mouth every 6 (six) hours as needed. For pain., Disp: , Rfl:   BP 135/68  Pulse 48  Resp 16  Ht 6' 1.75" (1.873 m)  Wt 191 lb 8 oz (86.864 kg)  BMI 24.76 kg/m2  SpO2 100%  Body mass index is 24.76 kg/(m^2).       Physical exam well-developed well-nourished gentleman appearing younger than stated age of 48 Carotid arteries are without bruits bilaterally Abdomen soft nontender no masses noted no hernia  noted Heart regular rate and rhythm Chest clear with equal breath sounds bilaterally Radial femoral and dorsalis pedis pulses are 2+ bilaterally. Slightly prominent popliteal pulses with no tenderness Neurologically he is grossly intact  Vascular after studies her office today reveal 40-59 percent left internal carotid artery stenosis which is unchanged from a study one year ago Popliteal artery size also unchanged at 1 cm on the right and 1.2 cm of the Impression and plan: Stable diffuse peripheral vascular occlusive disease. He will continue her walking program. We'll see him again in one year for repeat surveillance protocol followup. He'll notify should he develop any neurologic deficits or lower extremity ischemic symptoms

## 2012-08-22 ENCOUNTER — Other Ambulatory Visit: Payer: Self-pay

## 2012-08-29 ENCOUNTER — Ambulatory Visit (INDEPENDENT_AMBULATORY_CARE_PROVIDER_SITE_OTHER): Payer: Medicare Other | Admitting: Ophthalmology

## 2012-08-29 DIAGNOSIS — H33009 Unspecified retinal detachment with retinal break, unspecified eye: Secondary | ICD-10-CM

## 2012-08-29 DIAGNOSIS — I10 Essential (primary) hypertension: Secondary | ICD-10-CM

## 2012-08-29 DIAGNOSIS — H35039 Hypertensive retinopathy, unspecified eye: Secondary | ICD-10-CM

## 2012-08-29 DIAGNOSIS — H43819 Vitreous degeneration, unspecified eye: Secondary | ICD-10-CM

## 2012-08-29 DIAGNOSIS — H33309 Unspecified retinal break, unspecified eye: Secondary | ICD-10-CM

## 2012-10-05 ENCOUNTER — Encounter: Payer: Self-pay | Admitting: Cardiology

## 2012-10-05 DIAGNOSIS — N432 Other hydrocele: Secondary | ICD-10-CM | POA: Insufficient documentation

## 2012-10-05 DIAGNOSIS — H3320 Serous retinal detachment, unspecified eye: Secondary | ICD-10-CM | POA: Insufficient documentation

## 2012-10-05 DIAGNOSIS — I714 Abdominal aortic aneurysm, without rupture, unspecified: Secondary | ICD-10-CM | POA: Insufficient documentation

## 2012-10-05 DIAGNOSIS — Z9889 Other specified postprocedural states: Secondary | ICD-10-CM | POA: Insufficient documentation

## 2012-10-05 DIAGNOSIS — M1990 Unspecified osteoarthritis, unspecified site: Secondary | ICD-10-CM | POA: Insufficient documentation

## 2012-10-05 DIAGNOSIS — I701 Atherosclerosis of renal artery: Secondary | ICD-10-CM | POA: Insufficient documentation

## 2012-10-05 DIAGNOSIS — Z955 Presence of coronary angioplasty implant and graft: Secondary | ICD-10-CM | POA: Insufficient documentation

## 2012-10-05 DIAGNOSIS — H40059 Ocular hypertension, unspecified eye: Secondary | ICD-10-CM | POA: Insufficient documentation

## 2012-10-05 DIAGNOSIS — I739 Peripheral vascular disease, unspecified: Secondary | ICD-10-CM | POA: Insufficient documentation

## 2012-10-05 DIAGNOSIS — Z8719 Personal history of other diseases of the digestive system: Secondary | ICD-10-CM | POA: Insufficient documentation

## 2012-10-05 DIAGNOSIS — E7800 Pure hypercholesterolemia, unspecified: Secondary | ICD-10-CM | POA: Insufficient documentation

## 2012-10-05 DIAGNOSIS — I711 Thoracic aortic aneurysm, ruptured: Secondary | ICD-10-CM | POA: Insufficient documentation

## 2012-10-05 DIAGNOSIS — I724 Aneurysm of artery of lower extremity: Secondary | ICD-10-CM | POA: Insufficient documentation

## 2012-10-05 DIAGNOSIS — R351 Nocturia: Secondary | ICD-10-CM | POA: Insufficient documentation

## 2012-10-05 DIAGNOSIS — I2510 Atherosclerotic heart disease of native coronary artery without angina pectoris: Secondary | ICD-10-CM | POA: Insufficient documentation

## 2012-10-05 DIAGNOSIS — R011 Cardiac murmur, unspecified: Secondary | ICD-10-CM | POA: Insufficient documentation

## 2012-10-05 DIAGNOSIS — E785 Hyperlipidemia, unspecified: Secondary | ICD-10-CM | POA: Insufficient documentation

## 2012-10-05 DIAGNOSIS — Z800 Family history of malignant neoplasm of digestive organs: Secondary | ICD-10-CM | POA: Insufficient documentation

## 2012-10-05 DIAGNOSIS — J40 Bronchitis, not specified as acute or chronic: Secondary | ICD-10-CM | POA: Insufficient documentation

## 2012-11-06 ENCOUNTER — Encounter: Payer: Self-pay | Admitting: Cardiology

## 2012-11-07 ENCOUNTER — Encounter: Payer: Self-pay | Admitting: Cardiology

## 2012-11-07 ENCOUNTER — Ambulatory Visit (INDEPENDENT_AMBULATORY_CARE_PROVIDER_SITE_OTHER): Payer: Medicare Other | Admitting: Cardiology

## 2012-11-07 VITALS — BP 132/64 | HR 59 | Ht 73.0 in | Wt 197.4 lb

## 2012-11-07 DIAGNOSIS — E7800 Pure hypercholesterolemia, unspecified: Secondary | ICD-10-CM

## 2012-11-07 DIAGNOSIS — I10 Essential (primary) hypertension: Secondary | ICD-10-CM

## 2012-11-07 DIAGNOSIS — I2510 Atherosclerotic heart disease of native coronary artery without angina pectoris: Secondary | ICD-10-CM

## 2012-11-07 NOTE — Progress Notes (Signed)
[address] [place], [place]  [address] Phone: [phone] Fax:  [phone]  Date:  11/07/2012   ID:  [name], DOB 02-12-1940, MRN [id number]  PCP:  Pearla Dubonnet, MD  Cardiologist:  Armanda Magic, MD     History of Present Illness: [name] is a 72 y.o. male with a history of CAD/HTN/dyslipidemia who presents today for followup.  He is doing well.  He denies any chest pain, SOB, DOE, LE edema, dizziness, palpitations or syncope.     Wt Readings from Last 3 Encounters:  11/07/12 197 lb 6.4 oz (89.54 kg)  08/21/12 191 lb 8 oz (86.864 kg)  08/23/11 192 lb 3.2 oz (87.181 kg)     Past Medical History  Diagnosis Date  . Hypertension   . Heart murmur   . Nocturia   . Hyperlipidemia   . Arthritis     HANDS AND FEET  . Cataract immature BILATERAL   . Hydrocele, left   . History of AAA (abdominal aortic aneurysm) repair 2000    W/ AORTOVIFEMORAL BYPASS  . Peripheral vascular disease FOLLOWED BY DR EARLY-- VISIT NOTE 01-05-10 AND CAROTID / VASCULAR DOPPLER  RESULTS W/ CHART    ACTIVE WALKING PROGRAM  . Status post primary angioplasty with coronary stent 2002--  X2 BM STENTS  . Popliteal artery aneurysm LEFT  . Retinal detachment   . Increased intraocular pressure   . History of inguinal herniorrhaphy   . History of diverticulosis     Noted on Colonoscopy  . Coronary artery disease CARDIOLOGIST- DR Gloris Manchester Saya Mccoll-- LAST VISIT NOV  2012-- WILL REQUEST NOTE AND STRESS TEST    s/p PCI 2000  . Asymptomatic carotid artery stenosis BILATERAL     PER DOPPLER  01-05-10   RICA  1 - 39%/   LICA  40 - 59% - followed by Dr. Arbie Cookey  . Diverticulosis     Current Outpatient Prescriptions  Medication Sig Dispense Refill  . aspirin EC 81 MG tablet Take 81 mg by mouth daily.      [name] b complex vitamins tablet Take 1 tablet by mouth daily.        . calcium carbonate (OS-CAL) 600 MG TABS Take 600 mg by mouth daily.        [name] co-enzyme Q-10 30 MG capsule Take  200 mg by mouth daily.       [name] EPINEPHrine (EPIPEN) 0.3 mg/0.3 mL SOAJ injection Inject into the muscle as directed.      . ezetimibe (ZETIA) 10 MG tablet Take 10 mg by mouth every evening.       . hydrochlorothiazide (HYDRODIURIL) 25 MG tablet Take 25 mg by mouth daily.        [name] ibuprofen (ADVIL,MOTRIN) 200 MG tablet Take 600 mg by mouth every 6 (six) hours as needed. For pain.      . Magnesium Hydroxide (MAGNESIA PO) Take 1,000 mg by mouth as needed.      . Multiple Vitamin (MULTIVITAMIN) tablet Take 1 tablet by mouth daily.        . nitroGLYCERIN (NITROSTAT) 0.4 MG SL tablet Place 0.4 mg under the tongue every 5 (five) minutes as needed for chest pain.      . Omega-3 Fatty Acids (FISH OIL) 1200 MG CAPS Take by mouth daily.      . ramipril (ALTACE) 10 MG tablet Take 10 mg by mouth daily.        . rosuvastatin (CRESTOR) 20 MG tablet  Take 20 mg by mouth every evening.       . Tamsulosin HCl (FLOMAX) 0.4 MG CAPS Take 0.4 mg by mouth daily.        [name] VIT C-CHOLECALCIFEROL-ROSE HIP PO Take 2,000 mg by mouth daily.       No current facility-administered medications for this visit.    Allergies:    Allergies  Allergen Reactions  . Wasp Venom Shortness Of Breath and Swelling    Social History:  The patient  reports that he quit smoking about 34 years ago. His smoking use included Cigarettes. He smoked 0.00 packs per day for 30 years. He has never used smokeless tobacco. He reports that he drinks about 10.5 ounces of alcohol per week. He reports that he does not use illicit drugs.   Family History:  The patient's family history includes Cancer (age of onset: 77) in his mother; Heart disease in his mother; Heart failure in his father.   ROS:  Please see the history of present illness.      All other systems reviewed and negative.   PHYSICAL EXAM: VS:  BP 132/64  Pulse 59  Ht 6\' 1"  (1.854 m)  Wt 197 lb 6.4 oz (89.54 kg)  BMI 26.05 kg/m2 Well nourished, well developed, in no acute  distress HEENT: normal Neck: no JVDleft carotid artery bruit Cardiac:  normal S1, S2; RRR; no murmur Lungs:  clear to auscultation bilaterally, no wheezing, rhonchi or rales Abd: soft, nontender, no hepatomegaly Ext: no edema, warm extremities but difficult to palpate pulses which he says is chronic from PVD Skin: warm and dry Neuro:  CNs 2-12 intact, no focal abnormalities noted  EKG:  NSR  Old septal infarct  ASSESSMENT AND PLAN:  1. CAD stable with no   - continue ASA 2. HTN - controlled  - continue HCTZ/Altace 3. Dyslipidemia  - continue crestor 4.  PVD followed by Dr. Arbie Cookey  Followup with me in 1 year Signed, Armanda Magic, MD 11/07/2012 9:07 AM

## 2012-11-07 NOTE — Patient Instructions (Signed)
Your physician recommends that you continue on your current medications as directed. Please refer to the Current Medication list given to you today.  Your physician wants you to follow-up in: 1 year with Dr. Turner. You will receive a reminder letter in the mail two months in advance. If you don't receive a letter, please call our office to schedule the follow-up appointment.  

## 2012-11-22 ENCOUNTER — Other Ambulatory Visit: Payer: Self-pay

## 2012-12-10 ENCOUNTER — Other Ambulatory Visit: Payer: Self-pay | Admitting: *Deleted

## 2012-12-10 DIAGNOSIS — Z79899 Other long term (current) drug therapy: Secondary | ICD-10-CM

## 2012-12-10 DIAGNOSIS — I2510 Atherosclerotic heart disease of native coronary artery without angina pectoris: Secondary | ICD-10-CM

## 2013-01-17 HISTORY — PX: OTHER SURGICAL HISTORY: SHX169

## 2013-01-21 ENCOUNTER — Other Ambulatory Visit (INDEPENDENT_AMBULATORY_CARE_PROVIDER_SITE_OTHER): Payer: Medicare Other

## 2013-01-21 DIAGNOSIS — I2510 Atherosclerotic heart disease of native coronary artery without angina pectoris: Secondary | ICD-10-CM

## 2013-01-21 DIAGNOSIS — Z79899 Other long term (current) drug therapy: Secondary | ICD-10-CM

## 2013-01-21 LAB — ALT: ALT: 22 U/L (ref 0–53)

## 2013-01-22 LAB — NMR LIPOPROFILE WITH LIPIDS
CHOLESTEROL, TOTAL: 192 mg/dL (ref <200)
HDL PARTICLE NUMBER: 44.8 umol/L (ref >=30.5)
HDL Size: 10.1 nm (ref >=9.2)
HDL-C: 81 mg/dL (ref >=40)
LDL CALC: 102 mg/dL — AB (ref <100)
LDL Particle Number: 1035 nmol/L — ABNORMAL HIGH (ref <1000)
LDL Size: 21.5 nm (ref >20.5)
Large HDL-P: 12.5 umol/L (ref >=4.8)
Large VLDL-P: <0.8 nmol/L (ref <=2.7)
Small LDL Particle Number: <90 nmol/L (ref <=527)
Triglycerides: 47 mg/dL (ref <150)
VLDL Size: 47.8 nm — ABNORMAL HIGH (ref <=46.6)

## 2013-01-23 ENCOUNTER — Encounter: Payer: Self-pay | Admitting: General Surgery

## 2013-01-23 ENCOUNTER — Other Ambulatory Visit: Payer: Self-pay | Admitting: General Surgery

## 2013-01-23 DIAGNOSIS — E785 Hyperlipidemia, unspecified: Secondary | ICD-10-CM

## 2013-03-06 ENCOUNTER — Other Ambulatory Visit: Payer: Self-pay | Admitting: Vascular Surgery

## 2013-03-06 DIAGNOSIS — I6529 Occlusion and stenosis of unspecified carotid artery: Secondary | ICD-10-CM

## 2013-05-23 ENCOUNTER — Other Ambulatory Visit: Payer: Medicare Other

## 2013-05-24 ENCOUNTER — Other Ambulatory Visit (INDEPENDENT_AMBULATORY_CARE_PROVIDER_SITE_OTHER): Payer: Medicare Other

## 2013-05-24 ENCOUNTER — Other Ambulatory Visit: Payer: Self-pay | Admitting: General Surgery

## 2013-05-24 ENCOUNTER — Encounter: Payer: Self-pay | Admitting: General Surgery

## 2013-05-24 DIAGNOSIS — E7800 Pure hypercholesterolemia, unspecified: Secondary | ICD-10-CM

## 2013-05-24 DIAGNOSIS — E785 Hyperlipidemia, unspecified: Secondary | ICD-10-CM

## 2013-05-24 LAB — HEPATIC FUNCTION PANEL
ALK PHOS: 30 U/L — AB (ref 39–117)
ALT: 26 U/L (ref 0–53)
AST: 34 U/L (ref 0–37)
Albumin: 4 g/dL (ref 3.5–5.2)
BILIRUBIN DIRECT: 0 mg/dL (ref 0.0–0.3)
BILIRUBIN TOTAL: 1 mg/dL (ref 0.2–1.2)
Total Protein: 6.7 g/dL (ref 6.0–8.3)

## 2013-05-27 ENCOUNTER — Encounter: Payer: Self-pay | Admitting: General Surgery

## 2013-05-27 LAB — NMR LIPOPROFILE WITH LIPIDS
Cholesterol, Total: 164 mg/dL (ref <200)
HDL Particle Number: 43.1 umol/L (ref >=30.5)
HDL Size: 10.1 nm (ref >=9.2)
HDL-C: 84 mg/dL (ref >=40)
LDL (calc): 70 mg/dL (ref <100)
LDL PARTICLE NUMBER: 720 nmol/L (ref <1000)
LDL Size: 21 nm (ref >20.5)
LP-IR Score: <25 (ref <=45)
Large HDL-P: 13.9 umol/L (ref >=4.8)
Large VLDL-P: 0.9 nmol/L (ref <=2.7)
Small LDL Particle Number: <90 nmol/L (ref <=527)
TRIGLYCERIDES: 50 mg/dL (ref <150)
VLDL Size: 45.6 nm (ref <=46.6)

## 2013-07-17 DIAGNOSIS — R4701 Aphasia: Secondary | ICD-10-CM

## 2013-07-17 HISTORY — DX: Aphasia: R47.01

## 2013-08-26 ENCOUNTER — Encounter: Payer: Self-pay | Admitting: Family

## 2013-08-27 ENCOUNTER — Encounter: Payer: Self-pay | Admitting: Family

## 2013-08-27 ENCOUNTER — Ambulatory Visit (INDEPENDENT_AMBULATORY_CARE_PROVIDER_SITE_OTHER)
Admission: RE | Admit: 2013-08-27 | Discharge: 2013-08-27 | Disposition: A | Discharge status: Home / Self Care | Payer: Medicare Other | Source: Ambulatory Visit | Attending: Vascular Surgery | Admitting: Vascular Surgery

## 2013-08-27 ENCOUNTER — Ambulatory Visit (HOSPITAL_COMMUNITY)
Admission: RE | Admit: 2013-08-27 | Discharge: 2013-08-27 | Disposition: A | Discharge status: Home / Self Care | Payer: Medicare Other | Source: Ambulatory Visit | Attending: Family | Admitting: Family

## 2013-08-27 ENCOUNTER — Ambulatory Visit (INDEPENDENT_AMBULATORY_CARE_PROVIDER_SITE_OTHER): Payer: Medicare Other | Admitting: Family

## 2013-08-27 VITALS — BP 151/75 | HR 47 | Resp 14 | Ht 73.0 in | Wt 192.0 lb

## 2013-08-27 DIAGNOSIS — I6529 Occlusion and stenosis of unspecified carotid artery: Secondary | ICD-10-CM | POA: Insufficient documentation

## 2013-08-27 DIAGNOSIS — I724 Aneurysm of artery of lower extremity: Secondary | ICD-10-CM

## 2013-08-27 DIAGNOSIS — Z01810 Encounter for preprocedural cardiovascular examination: Secondary | ICD-10-CM

## 2013-08-27 NOTE — Progress Notes (Signed)
[facility] [place] HISTORY AND PHYSICAL   MRN : [id number]  History of Present Illness:   [name] is a 73 y.o. male patient of Dr. Donnetta Hutching who is s/p AAA open repair on 01/14/1999 by Dr. Donnetta Hutching. 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 He returns for follow up of the above. He had right great toe ingrown toenail corrected in January, 2015, and states he had no trouble healing. After walking about 1/2 mile on flat terrain he develops bilateral calf aching, sooner walking on an incline, relieved immediatly by rest. He denies any non healing wounds. Pt denies any history of stroke or TIA. This Friday he will see his ophthalmologist for follow up of repair of retinal tear. He describes an expanding wavy brightness in his central vision that occurs every few months, lasts for about 15 minutes,  for the last couple of years, states he will speak with his ophthalmologist about this. He had an episode about 2 weeks ago of receptive aphasia that lasted about 1.5 hours, this is the first time he had this type of event. He states that he has no residual neurological effects from the above incidents. He states he has never had a CTA or MRA of the head, none on file. Before his cardiac stent placement in February 2002, he had an episode of left arm pain, but denies any episodes of hemiparesis, denies episode of expressive aphasia, denies monocular loss of vision. Pt states he has a known heart murmur.    The patient has not had back or abdominal pain.  Pt Diabetic: No Pt smoker: former smoker, quit 25 years ago  Pt meds include: Statin :Yes ASA: Yes Other anticoagulants/antiplatelets: no  Current Outpatient Prescriptions  Medication Sig Dispense Refill  . amoxicillin-clavulanate (AUGMENTIN) 500-125 MG per tablet       . aspirin EC 81 MG tablet Take 81 mg by mouth daily.      [name] b complex vitamins tablet Take 1 tablet by mouth daily.         . calcium carbonate (OS-CAL) 600 MG TABS Take 600 mg by mouth daily.        [name] co-enzyme Q-10 30 MG capsule Take 200 mg by mouth daily.       [name] EPINEPHrine (EPIPEN) 0.3 mg/0.3 mL SOAJ injection Inject into the muscle as directed.      . hydrochlorothiazide (HYDRODIURIL) 25 MG tablet Take 25 mg by mouth daily.        [name] ibuprofen (ADVIL,MOTRIN) 200 MG tablet Take 600 mg by mouth as needed. For pain.      . Magnesium Hydroxide (MAGNESIA PO) Take 1,000 mg by mouth as needed.      . Multiple Vitamin (MULTIVITAMIN) tablet Take 1 tablet by mouth daily.        . nitroGLYCERIN (NITROSTAT) 0.4 MG SL tablet Place 0.4 mg under the tongue as needed for chest pain.       . Omega-3 Fatty Acids (FISH OIL) 1200 MG CAPS Take by mouth daily.      . ramipril (ALTACE) 10 MG tablet Take 10 mg by mouth daily.        . rosuvastatin (CRESTOR) 20 MG tablet Take 20 mg by mouth every evening.       . Tamsulosin HCl (FLOMAX) 0.4 MG CAPS Take 0.4 mg by mouth daily.        [name] VIT C-CHOLECALCIFEROL-ROSE HIP PO Take 2,000 mg by mouth daily.  No current facility-administered medications for this visit.    Past Medical History  Diagnosis Date  . Hypertension   . Heart murmur   . Nocturia   . Hyperlipidemia   . Arthritis     HANDS AND FEET  . Cataract immature BILATERAL   . Hydrocele, left   . History of AAA (abdominal aortic aneurysm) repair 2000    W/ AORTOVIFEMORAL BYPASS  . Peripheral vascular disease FOLLOWED BY DR EARLY-- VISIT NOTE 01-05-10 AND CAROTID / VASCULAR DOPPLER  RESULTS W/ CHART    ACTIVE WALKING PROGRAM  . Status post primary angioplasty with coronary stent 2002--  X2 BM STENTS  . Popliteal artery aneurysm LEFT  . Retinal detachment   . Increased intraocular pressure   . History of inguinal herniorrhaphy   . History of diverticulosis     Noted on Colonoscopy  . Coronary artery disease CARDIOLOGIST- DR Tressia Miners [facility]-- LAST VISIT NOV  2012-- WILL REQUEST NOTE AND STRESS TEST    s/p PCI 2000   . Asymptomatic carotid artery stenosis BILATERAL     PER DOPPLER  01-05-10   RICA  1 - 66%/   LICA  40 - 59% - followed by Dr. Donnetta Hutching  . Diverticulosis     Social History History  Substance Use Topics  . Smoking status: Former Smoker -- 30 years    Types: Cigarettes    Quit date: 01/18/1978  . Smokeless tobacco: Never Used  . Alcohol Use: 10.5 oz/week    21 drink(s) per week    Family History Family History  Problem Relation Age of Onset  . Heart disease Mother     AAA  Before age 10  . Cancer Mother 49    Colon cancer  . Hyperlipidemia Mother   . Heart failure Father     Surgical History Past Surgical History  Procedure Laterality Date  . Coronary angioplasty  2002    PTCA  W/ X2 BM STENTS--   PROXIMA LAD  &  LEFT CIRCUMFLEX TO FIRST OBTUSE MARGINAL BRANCH  . Aaa repair w/ aortobifemoral bypass  OCT  93570  . Pulley release -- right 5th finger  2009  . Exploratory laparotomy w/ enterolysis for partial sbo  09-23-2008  . Retinal detachment surgery  2004    BILATERAL  . Abdominal hernia repair  X4  (LAST ONE 1990'S)    inguinal herniorrhaphy  . Hydrocele excision  01/24/2011    Procedure: HYDROCELECTOMY ADULT;  Surgeon: Bernestine Amass, MD;  Location: [facility];  Service: Urology;  Laterality: Left;  . Tooth extraction  Dec 2013  . Carpal tunnel release Right Oct. 2013    Hand  . Ingrown toe nail Right Jan. 2015    Right Great Toe nail    Allergies  Allergen Reactions  . Wasp Venom Shortness Of Breath and Swelling    Current Outpatient Prescriptions  Medication Sig Dispense Refill  . amoxicillin-clavulanate (AUGMENTIN) 500-125 MG per tablet       . aspirin EC 81 MG tablet Take 81 mg by mouth daily.      [name] b complex vitamins tablet Take 1 tablet by mouth daily.        . calcium carbonate (OS-CAL) 600 MG TABS Take 600 mg by mouth daily.        [name] co-enzyme Q-10 30 MG capsule Take 200 mg by mouth daily.       [name] EPINEPHrine (EPIPEN) 0.3 mg/0.3 mL  SOAJ injection Inject into the muscle  as directed.      . hydrochlorothiazide (HYDRODIURIL) 25 MG tablet Take 25 mg by mouth daily.        [name] ibuprofen (ADVIL,MOTRIN) 200 MG tablet Take 600 mg by mouth as needed. For pain.      . Magnesium Hydroxide (MAGNESIA PO) Take 1,000 mg by mouth as needed.      . Multiple Vitamin (MULTIVITAMIN) tablet Take 1 tablet by mouth daily.        . nitroGLYCERIN (NITROSTAT) 0.4 MG SL tablet Place 0.4 mg under the tongue as needed for chest pain.       . Omega-3 Fatty Acids (FISH OIL) 1200 MG CAPS Take by mouth daily.      . ramipril (ALTACE) 10 MG tablet Take 10 mg by mouth daily.        . rosuvastatin (CRESTOR) 20 MG tablet Take 20 mg by mouth every evening.       . Tamsulosin HCl (FLOMAX) 0.4 MG CAPS Take 0.4 mg by mouth daily.        [name] VIT C-CHOLECALCIFEROL-ROSE HIP PO Take 2,000 mg by mouth daily.       No current facility-administered medications for this visit.     REVIEW OF SYSTEMS: See HPI for pertinent positives and negatives.  Physical Examination Filed Vitals:   08/27/13 1603 08/27/13 1606  BP: 127/77 151/75  Pulse: 48 47  Resp:  14  Height:  6\' 1"  (1.854 m)  Weight:  192 lb (87.091 kg)  SpO2:  100%   Body mass index is 25.34 kg/(m^2).  General:  WDWN in NAD Gait: Normal HENT: WNL Eyes: Pupils equal and react to light accomodation Pulmonary: normal non-labored breathing , without Rales, rhonchi,  wheezing Cardiac: RRR, positive murmur detected  Abdomen: soft, NT, no masses palpated. Skin: no rashes, ulcers noted;  no Gangrene , no cellulitis; no open wounds.  VASCULAR EXAM  Carotid Bruits Right Left   Negative Positive      Bilateral radial pulses are palpable. Aorta is palpable.                       VASCULAR EXAM: Extremities without ischemic changes  without Gangrene; without open wounds.                                                                                                          LE Pulses Right Left        FEMORAL  2+ palpable  1+ palpable        POPLITEAL  not palpable   not palpable       POSTERIOR TIBIAL  not palpable   not palpable        DORSALIS PEDIS      ANTERIOR TIBIAL not palpable  not palpable     Musculoskeletal: no muscle wasting or atrophy; no edema  Neurologic: A&O X 3; Appropriate Affect ;  SENSATION: normal; MOTOR FUNCTION: 5/5 Symmetric, CN 2-12 intact Speech is fluent/normal   Non-Invasive Vascular Imaging (08/27/2013):  CEREBROVASCULAR DUPLEX EVALUATION  INDICATION: Carotid artery disease    PREVIOUS INTERVENTION(S): None    DUPLEX EXAM: Carotid duplex    RIGHT  LEFT  Peak Systolic Velocities (cm/s) End Diastolic Velocities (cm/s) Plaque LOCATION Peak Systolic Velocities (cm/s) End Diastolic Velocities (cm/s) Plaque  87 15 HT CCA PROXIMAL 96 17 -  76 16 HT CCA MID 79 16 -  70 15 HT CCA DISTAL 105 21 HT  83 8 HT ECA 94 10 HT  177 43 HT ICA PROXIMAL 266 63 HT  96 24 - ICA MID 109 21 -  82 21 - ICA DISTAL 79 17 -    2.3 ICA / CCA Ratio (PSV) 3.3  Antegrade Vertebral Flow Antegrade  161 Brachial Systolic Pressure (mmHg) 096  Triphasic Brachial Artery Waveforms Triphasic    Plaque Morphology:  HM = Homogeneous, HT = Heterogeneous, CP = Calcific Plaque, SP = Smooth Plaque, IP = Irregular Plaque     ADDITIONAL FINDINGS:     IMPRESSION: 1. 50 - 59% bilateral internal carotid artery stenosis.    Compared to the previous exam:  Increase of velocity on the right.    LOWER EXTREMITY ARTERIAL DUPLEX EVALUATION      INDICATION: Follow up lower extremity aneurysms    PREVIOUS INTERVENTION(S): None    DUPLEX EXAM:     Popliteal Artery Diameter   AP (cm) Transverse (cm)  Right .96 .90  Left 1.1 1.0     ADDITIONAL FINDINGS: Left popliteal vein dilatation visualized measuring 1.29 cms AP.    IMPRESSION: 1. Technically difficult exam due to calcific plaque, left greater than right. 2. Monophasic waveforms noted bilaterally. 3. Measurements  above.    ASSESSMENT:  [name] is a 73 y.o. male  who is s/p AAA open repair on 01/14/1999. 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 He has mild claudication symptoms that are not life limiting. He describes an expanding wavy brightness in his central vision that occurs every few months, lasts for about 15 minutes,  for the last couple of years, states he will speak with his ophthalmologist about this when he sees him this week. He had an episode about 2 weeks ago of receptive aphasia that lasted about 1.5 hours, this is the first time he had this type of event. He states that he has no residual neurological effects from the above incidents. Bilateral ICA stenoses are mild/moderate, but he may be symptomatic. He has a considerable left proximal ICA elevated velocity. He states he has never had a CTA or MRA of the head, none on file. Dr. Donnetta Hutching mentioned that his PCP may consider a CTA or MRA of the head as part of an evaluation of his recent receptive aphasia event and visual events.  PLAN:   Based on today's exam and non-invasive vascular lab results, and after discussing with Dr. Donnetta Hutching, will schedule CTA neck within a week and pt to follow up with Dr. Donnetta Hutching. I discussed in depth with the patient the nature of atherosclerosis, and emphasized the importance of maximal medical management including strict control of blood pressure, blood glucose, and lipid levels, obtaining regular exercise, and continued cessation of smoking.  The patient is aware that without maximal medical management the underlying atherosclerotic disease process will progress, limiting the benefit of any interventions. The patient was given information about stroke prevention and what symptoms should prompt the patient to seek immediate medical care. The patient was given information about AAA including signs, symptoms, treatment,  what symptoms  should prompt the patient to seek immediate  medical care, and how to minimize the risk of enlargement and rupture of aneurysms. Thank you for allowing [place] to participate in this patient's care.  Clemon Chambers, RN, MSN, FNP-C [facility] Office: [phone]  Clinic MD: Early 08/27/2013 4:22 PM

## 2013-08-27 NOTE — Patient Instructions (Addendum)
Abdominal Aortic Aneurysm An aneurysm is a weakened or damaged part of an artery wall that bulges from the normal force of blood pumping through the body. An abdominal aortic aneurysm is an aneurysm that occurs in the lower part of the aorta, the main artery of the body.  The major concern with an abdominal aortic aneurysm is that it can enlarge and burst (rupture) or blood can flow between the layers of the wall of the aorta through a tear (aorticdissection). Both of these conditions can cause bleeding inside the body and can be life threatening unless diagnosed and treated promptly. CAUSES  The exact cause of an abdominal aortic aneurysm is unknown. Some contributing factors are:   A hardening of the arteries caused by the buildup of fat and other substances in the lining of a blood vessel (arteriosclerosis).  Inflammation of the walls of an artery (arteritis).   Connective tissue diseases, such as Marfan syndrome.   Abdominal trauma.   An infection, such as syphilis or staphylococcus, in the wall of the aorta (infectious aortitis) caused by bacteria. RISK FACTORS  Risk factors that contribute to an abdominal aortic aneurysm may include:  Age older than 60 years.   High blood pressure (hypertension).  Male gender.  Ethnicity (white race).  Obesity.  Family history of aneurysm (first degree relatives only).  Tobacco use. PREVENTION  The following healthy lifestyle habits may help decrease your risk of abdominal aortic aneurysm:  Quitting smoking. Smoking can raise your blood pressure and cause arteriosclerosis.  Limiting or avoiding alcohol.  Keeping your blood pressure, blood sugar level, and cholesterol levels within normal limits.  Decreasing your salt intake. In somepeople, too much salt can raise blood pressure and increase your risk of abdominal aortic aneurysm.  Eating a diet low in saturated fats and cholesterol.  Increasing your fiber intake by including  whole grains, vegetables, and fruits in your diet. Eating these foods may help lower blood pressure.  Maintaining a healthy weight.  Staying physically active and exercising regularly. SYMPTOMS  The symptoms of abdominal aortic aneurysm may vary depending on the size and rate of growth of the aneurysm.Most grow slowly and do not have any symptoms. When symptoms do occur, they may include:  Pain (abdomen, side, lower back, or groin). The pain may vary in intensity. A sudden onset of severe pain may indicate that the aneurysm has ruptured.  Feeling full after eating only small amounts of food.  Nausea or vomiting or both.  Feeling a pulsating lump in the abdomen.  Feeling faint or passing out. DIAGNOSIS  Since most unruptured abdominal aortic aneurysms have no symptoms, they are often discovered during diagnostic exams for other conditions. An aneurysm may be found during the following procedures:  Ultrasonography (A one-time screening for abdominal aortic aneurysm by ultrasonography is also recommended for all men aged 65-75 years who have ever smoked).  X-ray exams.  A computed tomography (CT).  Magnetic resonance imaging (MRI).  Angiography or arteriography. TREATMENT  Treatment of an abdominal aortic aneurysm depends on the size of your aneurysm, your age, and risk factors for rupture. Medication to control blood pressure and pain may be used to manage aneurysms smaller than 6 cm. Regular monitoring for enlargement may be recommended by your caregiver if:  The aneurysm is 3-4 cm in size (an annual ultrasonography may be recommended).  The aneurysm is 4-4.5 cm in size (an ultrasonography every 6 months may be recommended).  The aneurysm is larger than 4.5 cm in   size (your caregiver may ask that you be examined by a vascular surgeon). If your aneurysm is larger than 6 cm, surgical repair may be recommended. There are two main methods for repair of an aneurysm:   Endovascular  repair (a minimally invasive surgery). This is done most often.  Open repair. This method is used if an endovascular repair is not possible. Document Released: 10/13/2004 Document Revised: 04/30/2012 Document Reviewed: 02/03/2012 ExitCare Patient Information 2015 [employer]. This information is not intended to replace advice given to you by your health care provider. Make sure you discuss any questions you have with your health care provider.   Stroke Prevention Some medical conditions and behaviors are associated with an increased chance of having a stroke. You may prevent a stroke by making healthy choices and managing medical conditions. HOW CAN I REDUCE MY RISK OF HAVING A STROKE?   Stay physically active. Get at least 30 minutes of activity on most or all days.  Do not smoke. It may also be helpful to avoid exposure to secondhand smoke.  Limit alcohol use. Moderate alcohol use is considered to be:  No more than 2 drinks per day for men.  No more than 1 drink per day for nonpregnant women.  Eat healthy foods. This involves:  Eating 5 or more servings of fruits and vegetables a day.  Making dietary changes that address high blood pressure (hypertension), high cholesterol, diabetes, or obesity.  Manage your cholesterol levels.  Making food choices that are high in fiber and low in saturated fat, trans fat, and cholesterol may control cholesterol levels.  Take any prescribed medicines to control cholesterol as directed by your health care provider.  Manage your diabetes.  Controlling your carbohydrate and sugar intake is recommended to manage diabetes.  Take any prescribed medicines to control diabetes as directed by your health care provider.  Control your hypertension.  Making food choices that are low in salt (sodium), saturated fat, trans fat, and cholesterol is recommended to manage hypertension.  Take any prescribed medicines to control hypertension as directed  by your health care provider.  Maintain a healthy weight.  Reducing calorie intake and making food choices that are low in sodium, saturated fat, trans fat, and cholesterol are recommended to manage weight.  Stop drug abuse.  Avoid taking birth control pills.  Talk to your health care provider about the risks of taking birth control pills if you are over 35 years old, smoke, get migraines, or have ever had a blood clot.  Get evaluated for sleep disorders (sleep apnea).  Talk to your health care provider about getting a sleep evaluation if you snore a lot or have excessive sleepiness.  Take medicines only as directed by your health care provider.  For some people, aspirin or blood thinners (anticoagulants) are helpful in reducing the risk of forming abnormal blood clots that can lead to stroke. If you have the irregular heart rhythm of atrial fibrillation, you should be on a blood thinner unless there is a good reason you cannot take them.  Understand all your medicine instructions.  Make sure that other conditions (such as anemia or atherosclerosis) are addressed. SEEK IMMEDIATE MEDICAL CARE IF:   You have sudden weakness or numbness of the face, arm, or leg, especially on one side of the body.  Your face or eyelid droops to one side.  You have sudden confusion.  You have trouble speaking (aphasia) or understanding.  You have sudden trouble seeing in one or   both eyes.  You have sudden trouble walking.  You have dizziness.  You have a loss of balance or coordination.  You have a sudden, severe headache with no known cause.  You have new chest pain or an irregular heartbeat. Any of these symptoms may represent a serious problem that is an emergency. Do not wait to see if the symptoms will go away. Get medical help at once. Call your local emergency services (911 in [place] Do not drive yourself to the hospital. Document Released: 02/11/2004 Document Revised: 05/20/2013  Document Reviewed: 07/06/2012 ExitCare Patient Information 2015 [employer]. This information is not intended to replace advice given to you by your health care provider. Make sure you discuss any questions you have with your health care provider.  

## 2013-08-28 ENCOUNTER — Other Ambulatory Visit: Payer: Self-pay | Admitting: Vascular Surgery

## 2013-08-28 LAB — BUN: BUN: 19 mg/dL (ref 6–23)

## 2013-08-28 LAB — CREATININE, SERUM: Creat: 0.94 mg/dL (ref 0.50–1.35)

## 2013-08-28 NOTE — Addendum Note (Signed)
Addended by: Mena Goes on: 08/28/2013 09:18 AM   Modules accepted: Orders

## 2013-08-30 ENCOUNTER — Ambulatory Visit (INDEPENDENT_AMBULATORY_CARE_PROVIDER_SITE_OTHER): Payer: Medicare Other | Admitting: Ophthalmology

## 2013-08-30 DIAGNOSIS — H33309 Unspecified retinal break, unspecified eye: Secondary | ICD-10-CM

## 2013-08-30 DIAGNOSIS — H43819 Vitreous degeneration, unspecified eye: Secondary | ICD-10-CM

## 2013-08-30 DIAGNOSIS — H33009 Unspecified retinal detachment with retinal break, unspecified eye: Secondary | ICD-10-CM

## 2013-08-30 DIAGNOSIS — H35039 Hypertensive retinopathy, unspecified eye: Secondary | ICD-10-CM

## 2013-08-30 DIAGNOSIS — I10 Essential (primary) hypertension: Secondary | ICD-10-CM

## 2013-09-02 ENCOUNTER — Ambulatory Visit
Admission: RE | Admit: 2013-09-02 | Discharge: 2013-09-02 | Disposition: A | Discharge status: Home / Self Care | Payer: Medicare Other | Source: Ambulatory Visit | Attending: Family | Admitting: Family

## 2013-09-02 ENCOUNTER — Encounter: Payer: Self-pay | Admitting: Vascular Surgery

## 2013-09-02 DIAGNOSIS — I6529 Occlusion and stenosis of unspecified carotid artery: Secondary | ICD-10-CM

## 2013-09-02 DIAGNOSIS — Z01810 Encounter for preprocedural cardiovascular examination: Secondary | ICD-10-CM

## 2013-09-02 MED ORDER — IOHEXOL 350 MG/ML SOLN
50.0000 mL | Freq: Once | INTRAVENOUS | Status: AC | PRN
  Administered 2013-09-02: 50 mL via INTRAVENOUS

## 2013-09-03 ENCOUNTER — Ambulatory Visit (INDEPENDENT_AMBULATORY_CARE_PROVIDER_SITE_OTHER): Payer: Medicare Other | Admitting: Vascular Surgery

## 2013-09-03 ENCOUNTER — Encounter: Payer: Self-pay | Admitting: Vascular Surgery

## 2013-09-03 VITALS — BP 149/73 | HR 59 | Ht 73.0 in | Wt 190.0 lb

## 2013-09-03 DIAGNOSIS — Z0289 Encounter for other administrative examinations: Secondary | ICD-10-CM

## 2013-09-03 DIAGNOSIS — I6529 Occlusion and stenosis of unspecified carotid artery: Secondary | ICD-10-CM

## 2013-09-03 NOTE — Progress Notes (Signed)
Today for continued discussion of his profession occlusive disease. Well known to me from prior resection of abdominal aortic aneurysm in 2000. He been followed in our office with a symptomatic carotid disease. On recent evaluation he had a duplex showing no progression of moderate bilateral carotid stenoses. He did discuss this episode of mild potential resected aphasia. Further questioning he reports this was after restrictors activity the hot day. He denies any inability to speak. This. The more perception. He also has had unusual bilateral visual disturbances which is been transient and disturbing to him. This is both eyes specifically not typical of amaurosis. He saw Dr. Zigmund Daniel and underwent ocular evaluation showing no Hollenhorst plaques. He did undergo a CT angiogram of his neck for further evaluation of carotid disease for further definition of his level of stenosis. He is here today for discussion of this  Past Medical History  Diagnosis Date  . Hypertension   . Heart murmur   . Nocturia   . Hyperlipidemia   . Arthritis     HANDS AND FEET  . Cataract immature BILATERAL   . Hydrocele, left   . History of AAA (abdominal aortic aneurysm) repair 2000    W/ AORTOVIFEMORAL BYPASS  . Peripheral vascular disease FOLLOWED BY DR Raford Brissett-- VISIT NOTE 01-05-10 AND CAROTID / VASCULAR DOPPLER  RESULTS W/ CHART    ACTIVE WALKING PROGRAM  . Status post primary angioplasty with coronary stent 2002--  X2 BM STENTS  . Popliteal artery aneurysm LEFT  . Retinal detachment   . Increased intraocular pressure   . History of inguinal herniorrhaphy   . History of diverticulosis     Noted on Colonoscopy  . Coronary artery disease CARDIOLOGIST- DR Tressia Miners TURNER-- LAST VISIT NOV  2012-- WILL REQUEST NOTE AND STRESS TEST    s/p PCI 2000  . Asymptomatic carotid artery stenosis BILATERAL     PER DOPPLER  01-05-10   RICA  1 - 66%/   LICA  40 - 44% - followed by Dr. Donnetta Hutching  . Diverticulosis     History   Substance Use Topics  . Smoking status: Former Smoker -- 30 years    Types: Cigarettes    Quit date: 01/18/1978  . Smokeless tobacco: Never Used  . Alcohol Use: 10.5 oz/week    21 drink(s) per week    Family History  Problem Relation Age of Onset  . Heart disease Mother     AAA  Before age 7  . Cancer Mother 53    Colon cancer  . Hyperlipidemia Mother   . Heart failure Father     Allergies  Allergen Reactions  . Wasp Venom Shortness Of Breath and Swelling    Current outpatient prescriptions:aspirin EC 81 MG tablet, Take 81 mg by mouth daily., Disp: , Rfl: ;  b complex vitamins tablet, Take 1 tablet by mouth daily.  , Disp: , Rfl: ;  calcium carbonate (OS-CAL) 600 MG TABS, Take 600 mg by mouth daily.  , Disp: , Rfl: ;  co-enzyme Q-10 30 MG capsule, Take 200 mg by mouth daily. , Disp: , Rfl: ;  EPINEPHrine (EPIPEN) 0.3 mg/0.3 mL SOAJ injection, Inject into the muscle as directed., Disp: , Rfl:  hydrochlorothiazide (HYDRODIURIL) 25 MG tablet, Take 25 mg by mouth daily.  , Disp: , Rfl: ;  ibuprofen (ADVIL,MOTRIN) 200 MG tablet, Take 600 mg by mouth as needed. For pain., Disp: , Rfl: ;  Magnesium Hydroxide (MAGNESIA PO), Take 1,000 mg by mouth as needed., Disp: ,  Rfl: ;  Multiple Vitamin (MULTIVITAMIN) tablet, Take 1 tablet by mouth daily.  , Disp: , Rfl:  nitroGLYCERIN (NITROSTAT) 0.4 MG SL tablet, Place 0.4 mg under the tongue as needed for chest pain. , Disp: , Rfl: ;  Omega-3 Fatty Acids (FISH OIL) 1200 MG CAPS, Take by mouth daily., Disp: , Rfl: ;  ramipril (ALTACE) 10 MG tablet, Take 10 mg by mouth daily.  , Disp: , Rfl: ;  rosuvastatin (CRESTOR) 20 MG tablet, Take 20 mg by mouth every evening. , Disp: , Rfl: ;  Tamsulosin HCl (FLOMAX) 0.4 MG CAPS, Take 0.4 mg by mouth daily.  , Disp: , Rfl:  VIT C-CHOLECALCIFEROL-ROSE HIP PO, Take 2,000 mg by mouth daily., Disp: , Rfl: ;  amoxicillin-clavulanate (AUGMENTIN) 500-125 MG per tablet, , Disp: , Rfl:   BP 149/73  Pulse 59  Ht 6\' 1"   (1.854 m)  Wt 190 lb (86.183 kg)  BMI 25.07 kg/m2  SpO2 100%  Body mass index is 25.07 kg/(m^2).       Physical exam his carotid arteries without bruits bilaterally 2+ radial pulses bilaterally Grossly intact neurologically Prominent popliteal pulses with no tenderness  CT angiogram of his neck today was reviewed with the patient. This does show approximately 60% stenosis in his left internal carotid and less degree of stenosis in his right internal carotid artery. He does have extensive calcification of the carotid bifurcation. No particular severe irregularity.  Impression and plan: Moderate bilateral carotid stenoses. I do not feel that he has any symptoms referable to this. There is nothing to suggest embolus from this. I recommended continued observation with duplex and he is comfortable with this. I again reviewed symptoms of carotid disease Ostomy should this occur

## 2013-09-04 NOTE — Addendum Note (Signed)
Addended by: Mena Goes on: 09/04/2013 09:55 AM   Modules accepted: Orders

## 2013-09-10 ENCOUNTER — Encounter: Payer: Self-pay | Admitting: Neurology

## 2013-09-10 ENCOUNTER — Ambulatory Visit (INDEPENDENT_AMBULATORY_CARE_PROVIDER_SITE_OTHER): Payer: Medicare Other | Admitting: Neurology

## 2013-09-10 VITALS — BP 123/64 | HR 56 | Temp 96.7°F | Ht 73.0 in | Wt 189.0 lb

## 2013-09-10 DIAGNOSIS — G43109 Migraine with aura, not intractable, without status migrainosus: Secondary | ICD-10-CM | POA: Insufficient documentation

## 2013-09-10 NOTE — Progress Notes (Signed)
NEUROLOGY CLINIC NEW PATIENT NOTE  NAME: [name] DOB: [birth year]  I saw [name] as a new patient in the neurovascular clinic today regarding  Chief Complaint  Patient presents with  . New Evaluation    rm 1  .  HPI: [name] is a 73 y.o. male with PMH of HTN, HLD, CAD s/p stent, and carotid stenosis who presents as a new patient for visual disturbance.   He stated that for the last 2 years he started to have the episodes of seeing circles with weaving lines from either middle of eye field or side of eye field and growing to whole visual field. Looks like seeing from the bottom of glass, from prisms. It occurs in both eyes, as he closes one eye and the images are still there. This will last about 8-78min and is gone. It does not associate with any headache, N/V, dizziness, confusion, LOC or weakness / numbness. For the last 2 years, it happened 3-4 times.  He denies any history of migraine, or any family history of migraine. These episodes also were not followed by any headache. He denies any new medication or new food or certain food supplement was added for the last 2 years.    He was recently seen by Dr. Sherren Mocha for carotid steonsis and found to have 60% left ICA stenosis and 35% right ICA stenosis. There is an questionable aphasia or confusion episode associated with one of the visual disturbance episode. He was working in the yard when the visual episode happened, he was kind of dehydrated and he went inside and reading friend's message regarding a golf course, he was confused about the name because there are many similar names of the golf course. He stated that he was not aphasia or confused, instead, he tested himself with all his doctors names and he was fine.   He has hx of HTN, HLD, CAD and he is on medications. He is also on baby ASA daily. He generally has good health and very active and loves golf.   He quit smoking long time ago and mild drinking and no  illicit drugs.  Past Medical History  Diagnosis Date  . Hypertension   . Heart murmur   . Nocturia   . Hyperlipidemia   . Arthritis     HANDS AND FEET  . Cataract immature BILATERAL   . Hydrocele, left   . History of AAA (abdominal aortic aneurysm) repair 2000    W/ AORTOVIFEMORAL BYPASS  . Peripheral vascular disease FOLLOWED BY DR EARLY-- VISIT NOTE 01-05-10 AND CAROTID / VASCULAR DOPPLER  RESULTS W/ CHART    ACTIVE WALKING PROGRAM  . Status post primary angioplasty with coronary stent 2002--  X2 BM STENTS  . Popliteal artery aneurysm LEFT  . Retinal detachment   . Increased intraocular pressure   . History of inguinal herniorrhaphy   . History of diverticulosis     Noted on Colonoscopy  . Coronary artery disease CARDIOLOGIST- DR Tressia Miners TURNER-- LAST VISIT NOV  2012-- WILL REQUEST NOTE AND STRESS TEST    s/p PCI 2000  . Asymptomatic carotid artery stenosis BILATERAL     PER DOPPLER  01-05-10   RICA  1 - 85%/   LICA  40 - 27% - followed by Dr. Donnetta Hutching  . Diverticulosis   . H/O heart artery stent 02/19/00   Past Surgical History  Procedure Laterality Date  . Coronary angioplasty  2002    PTCA  W/ X2 BM STENTS--   PROXIMA LAD  &  LEFT CIRCUMFLEX TO FIRST OBTUSE MARGINAL BRANCH  . Aaa repair w/ aortobifemoral bypass  OCT  25852  . Pulley release -- right 5th finger  2009  . Exploratory laparotomy w/ enterolysis for partial sbo  09-23-2008  . Retinal detachment surgery  2004    BILATERAL  . Abdominal hernia repair  X4  (LAST ONE 1990'S)    inguinal herniorrhaphy  . Hydrocele excision  01/24/2011    Procedure: HYDROCELECTOMY ADULT;  Surgeon: Bernestine Amass, MD;  Location: [facility];  Service: Urology;  Laterality: Left;  . Tooth extraction  Dec 2013  . Carpal tunnel release Right Oct. 2013    Hand  . Ingrown toe nail Right Jan. 2015    Right Great Toe nail  . Intest blockage  09/22/08  . Heart stents  02/19/00   Family History  Problem Relation Age of Onset    . Heart disease Mother     AAA  Before age 52  . Cancer Mother 30    Colon cancer  . Hyperlipidemia Mother   . Heart failure Father    Current Outpatient Prescriptions  Medication Sig Dispense Refill  . amoxicillin-clavulanate (AUGMENTIN) 500-125 MG per tablet       . aspirin EC 81 MG tablet Take 81 mg by mouth daily.      [name] b complex vitamins tablet Take 1 tablet by mouth daily.        . calcium carbonate (OS-CAL) 600 MG TABS Take 600 mg by mouth daily.        [name] co-enzyme Q-10 30 MG capsule Take 200 mg by mouth daily.       [name] EPINEPHrine (EPIPEN) 0.3 mg/0.3 mL SOAJ injection Inject into the muscle as directed.      . hydrochlorothiazide (HYDRODIURIL) 25 MG tablet Take 25 mg by mouth daily.        [name] ibuprofen (ADVIL,MOTRIN) 200 MG tablet Take 600 mg by mouth as needed. For pain.      . Magnesium Hydroxide (MAGNESIA PO) Take 1,000 mg by mouth as needed.      . Multiple Vitamin (MULTIVITAMIN) tablet Take 1 tablet by mouth daily.        . Omega-3 Fatty Acids (FISH OIL) 1200 MG CAPS Take by mouth daily.      . ramipril (ALTACE) 10 MG tablet Take 10 mg by mouth daily.        . rosuvastatin (CRESTOR) 20 MG tablet Take 20 mg by mouth every evening.       . Tamsulosin HCl (FLOMAX) 0.4 MG CAPS Take 0.4 mg by mouth daily.        [name] VIT C-CHOLECALCIFEROL-ROSE HIP PO Take 2,000 mg by mouth daily.       No current facility-administered medications for this visit.   Allergies  Allergen Reactions  . Wasp Venom Shortness Of Breath and Swelling   History   Social History  . Marital Status: Married    Spouse Name: N/A    Number of Children: N/A  . Years of Education: N/A   Occupational History  . Not on file.   Social History Main Topics  . Smoking status: Former Smoker -- 30 years    Types: Cigarettes    Quit date: 01/18/1978  . Smokeless tobacco: Never Used  . Alcohol Use: 10.5 oz/week    21 drink(s) per week  . Drug Use: No  . Sexual Activity: Not  on file   Other Topics Concern   . Not on file   Social History Narrative  . No narrative on file    Review of Systems Full 14 system review of systems performed and notable only for those listed, all others are neg:  Easy bruising, ringing in the ears  Physical Exam  Filed Vitals:   09/10/13 1238  BP: 123/64  Pulse: 56  Temp: 96.7 F (35.9 C)    General - Well nourished, well developed, in no apparent distress.  Ophthalmologic - Sharp disc margins OU.  Cardiovascular - Regular rate and rhythm with no murmur. Carotid pulses were 2+ without bruits .   Neck - supple, no nuchal rigidity .  Mental Status -  Level of arousal and orientation to time, place, and person were intact. Language including expression, naming, repetition, comprehension, reading, and writing was assessed and found intact. Attention span and concentration were normal. Recent and remote memory were intact. Fund of Knowledge was assessed and was intact.  Cranial Nerves II - XII - II - Visual field intact OU. III, IV, VI - Extraocular movements intact. V - Facial sensation intact bilaterally. VII - Facial movement intact bilaterally. VIII - Hearing & vestibular intact bilaterally. X - Palate elevates symmetrically. XI - Chin turning & shoulder shrug intact bilaterally. XII - Tongue protrusion intact.  Motor Strength - The patient's strength was normal in all extremities and pronator drift was absent.  Bulk was normal and fasciculations were absent.   Motor Tone - Muscle tone was assessed at the neck and appendages and was normal.  Reflexes - The patient's reflexes were normal in all extremities and he had no pathological reflexes.  Sensory - Light touch, temperature/pinprick, vibration and proprioception, and Romberg testing were assessed and were normal.    Coordination - The patient had normal movements in the hands and feet with no ataxia or dysmetria.  Tremor was absent.  Gait and Station - The patient's transfers, posture,  gait, station, and turns were observed as normal.   Imaging CTA neck - 09/02/13 IMPRESSION:  Atherosclerotic disease at both carotid bifurcations. 35% stenosis  of the proximal ICA on the right and 60% stenosis of the proximal  ICA on the left.  Lab Review none  Assessments: NIHSS: 0 Rankin: 0   Assessment and Plan:   In summary, [name] is a 73 y.o. male with PMH of HTN, HLD, CAD s/p stent and carotid stenosis presents visual disturbance. By his description, this is typical for migraine visual aura, so called fortification. The duration also fits to the migraine visual aura. It could happen without headache, especially in elderly pt in the setting of previous migraine headache although he does not have hx of HA. Since it happens only 3-4 times for the last 2 years, no need any prophylaxis or abortive medications.   Regarding his carotid stenosis, so far it is asymptomatic, less than 70%, agree with Dr. Sherren Mocha for continued monitoring. No intervention needed at this time.   I do not have his lab regarding stroke risk factor control. But he is following up with PCP and Dr. Sherren Mocha for that. He is on ASA and statin now. I recommend aggressive blood pressure control with a goal <130/80 mm Hg.  Lipids should be managed intensively, with a goal LDL < 70 mg/dL.  I encouraged the patient to discuss these important issues with his primary care physician.  I counseled the patient on measures to reduce stroke risk,  including the importance of medication compliance, risk factor control, exercise, healthy diet, and avoidance of smoking.  I reviewed stroke warning signs and symptoms and appropriate actions to take if such occurs.   Thank you very much for the opportunity to participate in the care of this patient.  Please do not hesitate to call if any questions or concerns arise.   Rosalin Hawking, MD PhD [facility] [address] [place], [place] [address] [phone]

## 2013-11-27 ENCOUNTER — Other Ambulatory Visit (INDEPENDENT_AMBULATORY_CARE_PROVIDER_SITE_OTHER): Payer: Medicare Other | Admitting: *Deleted

## 2013-11-27 DIAGNOSIS — E7800 Pure hypercholesterolemia, unspecified: Secondary | ICD-10-CM

## 2013-11-27 LAB — LIPID PANEL
CHOL/HDL RATIO: 3
Cholesterol: 191 mg/dL (ref 0–200)
HDL: 70.3 mg/dL (ref >39.00)
LDL Cholesterol: 112 mg/dL — ABNORMAL HIGH (ref 0–99)
NONHDL: 120.7
Triglycerides: 45 mg/dL (ref 0.0–149.0)
VLDL: 9 mg/dL (ref 0.0–40.0)

## 2013-11-27 LAB — HEPATIC FUNCTION PANEL
ALBUMIN: 3.5 g/dL (ref 3.5–5.2)
ALT: 22 U/L (ref 0–53)
AST: 29 U/L (ref 0–37)
Alkaline Phosphatase: 38 U/L — ABNORMAL LOW (ref 39–117)
Bilirubin, Direct: 0 mg/dL (ref 0.0–0.3)
Total Bilirubin: 0.3 mg/dL (ref 0.2–1.2)
Total Protein: 7 g/dL (ref 6.0–8.3)

## 2013-12-05 ENCOUNTER — Other Ambulatory Visit: Payer: Self-pay

## 2013-12-05 DIAGNOSIS — Z79899 Other long term (current) drug therapy: Secondary | ICD-10-CM

## 2013-12-05 DIAGNOSIS — E785 Hyperlipidemia, unspecified: Secondary | ICD-10-CM

## 2014-03-11 ENCOUNTER — Ambulatory Visit: Payer: Medicare Other | Admitting: Vascular Surgery

## 2014-03-11 ENCOUNTER — Other Ambulatory Visit (HOSPITAL_COMMUNITY): Payer: Medicare Other

## 2014-03-17 ENCOUNTER — Encounter: Payer: Self-pay | Admitting: Vascular Surgery

## 2014-03-18 ENCOUNTER — Ambulatory Visit (INDEPENDENT_AMBULATORY_CARE_PROVIDER_SITE_OTHER): Payer: Medicare Other | Admitting: Vascular Surgery

## 2014-03-18 ENCOUNTER — Ambulatory Visit (HOSPITAL_COMMUNITY)
Admission: RE | Admit: 2014-03-18 | Discharge: 2014-03-18 | Disposition: A | Discharge status: Home / Self Care | Payer: Medicare Other | Source: Ambulatory Visit | Attending: Vascular Surgery | Admitting: Vascular Surgery

## 2014-03-18 ENCOUNTER — Encounter: Payer: Self-pay | Admitting: Vascular Surgery

## 2014-03-18 VITALS — BP 158/78 | HR 52 | Ht 74.0 in | Wt 193.0 lb

## 2014-03-18 DIAGNOSIS — I6523 Occlusion and stenosis of bilateral carotid arteries: Secondary | ICD-10-CM

## 2014-03-18 DIAGNOSIS — I724 Aneurysm of artery of lower extremity: Secondary | ICD-10-CM

## 2014-03-18 DIAGNOSIS — I739 Peripheral vascular disease, unspecified: Secondary | ICD-10-CM

## 2014-03-18 NOTE — Progress Notes (Signed)
Patient name: [name] MRN: [id number] DOB: [birth year] Sex: male     Reason for referral:  Chief Complaint  Patient presents with  . Carotid    1 year f/u  lab prior    HISTORY OF PRESENT ILLNESS:  here today for follow-up of diffuse peripheral vascular occlusive disease. He has 15 years status post repair of a large abdominal aortic aneurysm with aortobifemoral bypass. He has been seen for asymptomatic carotid disease. Had a CT angiogram 1 year ago which showed 60% left carotid stenosis and plaque bilaterally. He has had no neurologic deficits. 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 No focal neurologic deficits. He does have stable claudication bilaterally. Did have an episode of sciatic nerve pain on the left several weeks ago which settled into his knee area and now is nearly resolved.  Past Medical History  Diagnosis Date  . Hypertension   . Heart murmur   . Nocturia   . Hyperlipidemia   . Arthritis     HANDS AND FEET  . Cataract immature BILATERAL   . Hydrocele, left   . History of AAA (abdominal aortic aneurysm) repair 2000    W/ AORTOVIFEMORAL BYPASS  . Peripheral vascular disease FOLLOWED BY DR Antonella Upson-- VISIT NOTE 01-05-10 AND CAROTID / VASCULAR DOPPLER  RESULTS W/ CHART    ACTIVE WALKING PROGRAM  . Status post primary angioplasty with coronary stent 2002--  X2 BM STENTS  . Popliteal artery aneurysm LEFT  . Retinal detachment   . Increased intraocular pressure   . History of inguinal herniorrhaphy   . History of diverticulosis     Noted on Colonoscopy  . Coronary artery disease CARDIOLOGIST- DR Tressia Miners TURNER-- LAST VISIT NOV  2012-- WILL REQUEST NOTE AND STRESS TEST    s/p PCI 2000  . Asymptomatic carotid artery stenosis BILATERAL     PER DOPPLER  01-05-10   RICA  1 - 45%/   LICA  40 - 36% - followed by Dr. Donnetta Hutching  . Diverticulosis   . H/O heart artery stent 02/19/00    Past Surgical History  Procedure  Laterality Date  . Coronary angioplasty  2002    PTCA  W/ X2 BM STENTS--   PROXIMA LAD  &  LEFT CIRCUMFLEX TO FIRST OBTUSE MARGINAL BRANCH  . Aaa repair w/ aortobifemoral bypass  OCT  46803  . Pulley release -- right 5th finger  2009  . Exploratory laparotomy w/ enterolysis for partial sbo  09-23-2008  . Retinal detachment surgery  2004    BILATERAL  . Abdominal hernia repair  X4  (LAST ONE 1990'S)    inguinal herniorrhaphy  . Hydrocele excision  01/24/2011    Procedure: HYDROCELECTOMY ADULT;  Surgeon: Bernestine Amass, MD;  Location: [facility];  Service: Urology;  Laterality: Left;  . Tooth extraction  Dec 2013  . Carpal tunnel release Right Oct. 2013    Hand  . Ingrown toe nail Right Jan. 2015    Right Great Toe nail  . Intest blockage  09/22/08  . Heart stents  02/19/00    History   Social History  . Marital Status: Married    Spouse Name: N/A  . Number of Children: N/A  . Years of Education: N/A   Occupational History  . Not on file.   Social History Main Topics  . Smoking status: Former Smoker -- 30 years    Types: Cigarettes  Quit date: 01/18/1978  . Smokeless tobacco: Never Used  . Alcohol Use: 12.6 oz/week    21 Standard drinks or equivalent per week  . Drug Use: No  . Sexual Activity: Not on file   Other Topics Concern  . Not on file   Social History Narrative    Family History  Problem Relation Age of Onset  . Heart disease Mother     AAA  Before age 37  . Cancer Mother 1    Colon cancer  . Hyperlipidemia Mother   . Heart failure Father     Allergies as of 03/18/2014 - Review Complete 03/18/2014  Allergen Reaction Noted  . Wasp venom Shortness Of Breath and Swelling 08/21/2012    Current Outpatient Prescriptions on File Prior to Visit  Medication Sig Dispense Refill  . aspirin EC 81 MG tablet Take 81 mg by mouth daily.    [name] b complex vitamins tablet Take 1 tablet by mouth daily.      . calcium carbonate (OS-CAL) 600 MG TABS  Take 600 mg by mouth daily.      [name] co-enzyme Q-10 30 MG capsule Take 200 mg by mouth daily.     [name] EPINEPHrine (EPIPEN) 0.3 mg/0.3 mL SOAJ injection Inject into the muscle as directed.    . hydrochlorothiazide (HYDRODIURIL) 25 MG tablet Take 25 mg by mouth daily.      [name] ibuprofen (ADVIL,MOTRIN) 200 MG tablet Take 600 mg by mouth as needed. For pain.    . Magnesium Hydroxide (MAGNESIA PO) Take 1,000 mg by mouth as needed.    . Multiple Vitamin (MULTIVITAMIN) tablet Take 1 tablet by mouth daily.      . Omega-3 Fatty Acids (FISH OIL) 1200 MG CAPS Take by mouth daily.    . rosuvastatin (CRESTOR) 20 MG tablet Take 20 mg by mouth every evening.     . Tamsulosin HCl (FLOMAX) 0.4 MG CAPS Take 0.4 mg by mouth daily.      [name] VIT C-CHOLECALCIFEROL-ROSE HIP PO Take 2,000 mg by mouth daily.    [name] amoxicillin-clavulanate (AUGMENTIN) 500-125 MG per tablet     . ramipril (ALTACE) 10 MG tablet Take 10 mg by mouth daily.       No current facility-administered medications on file prior to visit.       PHYSICAL EXAMINATION:  General: The patient is a well-nourished male, in no acute distress. Vital signs are BP 158/78 mmHg  Pulse 52  Ht 6\' 2"  (1.88 m)  Wt 193 lb (87.544 kg)  BMI 24.77 kg/m2 Pulmonary: There is a good air exchange bilaterally Abdomen: Soft and non-tender with  No palpable masses. No hernia noted Musculoskeletal: There are no major deformities.  There is no significant extremity pain. Neurologic: No focal weakness or paresthesias are detected, Skin: There are no ulcer or rashes noted. Psychiatric: The patient has normal affect. Cardiovascular: There is a regular rate and rhythm without significant murmur appreciated.  pulse status: 2+ radial 2+ femoral pulses. Absent popliteal and distal pulses. I do not feel any evidence of popliteal artery aneurysms.  Carotid arteries without bruits bilaterally  [facility] Vascular Lab Studies:  Ordered and Independently Reviewed  ) Duplex today reveals  40-59% (less than 40% right carotid stenosis.  Impression and Plan:   diffuse peripheral vascular occlusive disease. Stable bilateral claudication with known in for inguinal disease. Stable carotid stenosis no neurologic deficits. He will continue his activity as tolerated. Will see [place] again in one year with carotid duplex ankle  arm indices and duplex of his popliteal arteries.    Giancarlos Berendt [facility] Office: [phone]

## 2014-03-18 NOTE — Addendum Note (Signed)
Addended by: Mena Goes on: 03/18/2014 11:42 AM   Modules accepted: Orders

## 2014-04-01 ENCOUNTER — Other Ambulatory Visit: Payer: Self-pay

## 2014-04-02 ENCOUNTER — Other Ambulatory Visit (INDEPENDENT_AMBULATORY_CARE_PROVIDER_SITE_OTHER): Payer: Medicare Other | Admitting: *Deleted

## 2014-04-02 DIAGNOSIS — E785 Hyperlipidemia, unspecified: Secondary | ICD-10-CM

## 2014-04-02 DIAGNOSIS — Z79899 Other long term (current) drug therapy: Secondary | ICD-10-CM

## 2014-04-02 LAB — HEPATIC FUNCTION PANEL
ALBUMIN: 4.3 g/dL (ref 3.5–5.2)
ALK PHOS: 33 U/L — AB (ref 39–117)
ALT: 21 U/L (ref 0–53)
AST: 32 U/L (ref 0–37)
BILIRUBIN DIRECT: 0.1 mg/dL (ref 0.0–0.3)
TOTAL PROTEIN: 6.8 g/dL (ref 6.0–8.3)
Total Bilirubin: 0.5 mg/dL (ref 0.2–1.2)

## 2014-04-02 LAB — LIPID PANEL
CHOLESTEROL: 164 mg/dL (ref 0–200)
HDL: 77.8 mg/dL (ref >39.00)
LDL Cholesterol: 78 mg/dL (ref 0–99)
NonHDL: 86.2
TRIGLYCERIDES: 39 mg/dL (ref 0.0–149.0)
Total CHOL/HDL Ratio: 2
VLDL: 7.8 mg/dL (ref 0.0–40.0)

## 2014-04-02 NOTE — Addendum Note (Signed)
Addended by: Eulis Foster on: 04/02/2014 08:51 AM   Modules accepted: Orders

## 2014-05-22 ENCOUNTER — Encounter: Payer: Self-pay | Admitting: Vascular Surgery

## 2014-05-23 ENCOUNTER — Other Ambulatory Visit: Payer: Medicare Other

## 2014-06-10 ENCOUNTER — Inpatient Hospital Stay (HOSPITAL_COMMUNITY)
Admission: EM | Admit: 2014-06-10 | Discharge: 2014-06-11 | DRG: 309 | Disposition: A | Discharge status: Home / Self Care | Payer: Medicare Other | Attending: Cardiology | Admitting: Cardiology

## 2014-06-10 ENCOUNTER — Emergency Department (HOSPITAL_COMMUNITY): Payer: Medicare Other

## 2014-06-10 ENCOUNTER — Encounter (HOSPITAL_COMMUNITY): Payer: Self-pay | Admitting: Cardiology

## 2014-06-10 DIAGNOSIS — Z95828 Presence of other vascular implants and grafts: Secondary | ICD-10-CM

## 2014-06-10 DIAGNOSIS — Z955 Presence of coronary angioplasty implant and graft: Secondary | ICD-10-CM

## 2014-06-10 DIAGNOSIS — I724 Aneurysm of artery of lower extremity: Secondary | ICD-10-CM | POA: Diagnosis present

## 2014-06-10 DIAGNOSIS — Z8679 Personal history of other diseases of the circulatory system: Secondary | ICD-10-CM

## 2014-06-10 DIAGNOSIS — I2510 Atherosclerotic heart disease of native coronary artery without angina pectoris: Secondary | ICD-10-CM | POA: Diagnosis present

## 2014-06-10 DIAGNOSIS — R002 Palpitations: Secondary | ICD-10-CM | POA: Diagnosis not present

## 2014-06-10 DIAGNOSIS — I440 Atrioventricular block, first degree: Secondary | ICD-10-CM | POA: Diagnosis not present

## 2014-06-10 DIAGNOSIS — Z91038 Other insect allergy status: Secondary | ICD-10-CM

## 2014-06-10 DIAGNOSIS — E785 Hyperlipidemia, unspecified: Secondary | ICD-10-CM | POA: Diagnosis present

## 2014-06-10 DIAGNOSIS — I739 Peripheral vascular disease, unspecified: Secondary | ICD-10-CM | POA: Diagnosis present

## 2014-06-10 DIAGNOSIS — R011 Cardiac murmur, unspecified: Secondary | ICD-10-CM | POA: Diagnosis present

## 2014-06-10 DIAGNOSIS — I6523 Occlusion and stenosis of bilateral carotid arteries: Secondary | ICD-10-CM | POA: Diagnosis present

## 2014-06-10 DIAGNOSIS — I10 Essential (primary) hypertension: Secondary | ICD-10-CM | POA: Diagnosis present

## 2014-06-10 DIAGNOSIS — Z87891 Personal history of nicotine dependence: Secondary | ICD-10-CM

## 2014-06-10 DIAGNOSIS — I249 Acute ischemic heart disease, unspecified: Secondary | ICD-10-CM | POA: Diagnosis present

## 2014-06-10 DIAGNOSIS — Z8249 Family history of ischemic heart disease and other diseases of the circulatory system: Secondary | ICD-10-CM

## 2014-06-10 LAB — BASIC METABOLIC PANEL
ANION GAP: 10 (ref 5–15)
BUN: 18 mg/dL (ref 6–20)
CALCIUM: 9.8 mg/dL (ref 8.9–10.3)
CO2: 28 mmol/L (ref 22–32)
Chloride: 95 mmol/L — ABNORMAL LOW (ref 101–111)
Creatinine, Ser: 1.08 mg/dL (ref 0.61–1.24)
GFR calc Af Amer: >60 mL/min (ref >60)
Glucose, Bld: 87 mg/dL (ref 65–99)
Potassium: 3.6 mmol/L (ref 3.5–5.1)
Sodium: 133 mmol/L — ABNORMAL LOW (ref 135–145)

## 2014-06-10 LAB — I-STAT TROPONIN, ED
TROPONIN I, POC: 0.01 ng/mL (ref 0.00–0.08)
Troponin i, poc: 0 ng/mL (ref 0.00–0.08)

## 2014-06-10 LAB — CBC
HCT: 39.9 % (ref 39.0–52.0)
Hemoglobin: 14 g/dL (ref 13.0–17.0)
MCH: 34 pg (ref 26.0–34.0)
MCHC: 35.1 g/dL (ref 30.0–36.0)
MCV: 96.8 fL (ref 78.0–100.0)
Platelets: 177 10*3/uL (ref 150–400)
RBC: 4.12 MIL/uL — ABNORMAL LOW (ref 4.22–5.81)
RDW: 12.5 % (ref 11.5–15.5)
WBC: 6.3 10*3/uL (ref 4.0–10.5)

## 2014-06-10 LAB — BRAIN NATRIURETIC PEPTIDE: B Natriuretic Peptide: 132.7 pg/mL — ABNORMAL HIGH (ref 0.0–100.0)

## 2014-06-10 NOTE — ED Notes (Signed)
Patient stated he was working in the yard today and he felt like his pulse was going up.  Sat down to rest and went back to the yard after 3 minutes but his pulse remained elevated.  His BP came down but the pulse remained up.  Denied chest pain at the time but had a pain under his left shoulder blade that increased with movement.  Wife stated he was gray when this happened

## 2014-06-10 NOTE — ED Notes (Signed)
Dr. Theora Gianotti at the [facility]

## 2014-06-10 NOTE — ED Notes (Signed)
Reviewed plan of care with Dr. Rogers Blocker, continuing to monitor telemetry

## 2014-06-10 NOTE — ED Notes (Addendum)
Pt reports that he was out in the yard this afternoon working and felt like his pulse was racing. Came inside and checked his BP and pulse. Pulse was 115. Did not feel any better. Reports pain in the back and left shoulder blade.  SOB at this time

## 2014-06-10 NOTE — ED Notes (Signed)
Patient removed a tick from his upper back 2 days ago.

## 2014-06-10 NOTE — ED Provider Notes (Signed)
CSN: [id number]     Arrival date & time 06/10/14  1653 History   First MD Initiated Contact with Patient 06/10/14 2003     Chief Complaint  Patient presents with  . Palpitations     (Consider location/radiation/quality/duration/timing/severity/associated sxs/prior Treatment) Patient is a 74 y.o. male presenting with palpitations. The history is provided by the patient.  Palpitations Palpitations quality:  Fast Onset quality:  Sudden Duration:  1 hour Timing:  Constant Progression:  Improving Chronicity:  New Context comment:  While working outside Relieved by:  Nothing Worsened by:  Nothing Ineffective treatments:  Bed rest Associated symptoms: back pain (L upper back), chest pain and shortness of breath   Associated symptoms: no chest pressure, no cough, no diaphoresis, no dizziness, no leg pain, no lower extremity edema, no nausea, no near-syncope, no numbness, no syncope, no vomiting and no weakness   Chest pain:    Quality: sharp     Severity:  Moderate   Onset quality:  Gradual   Duration:  1 hour   Timing:  Constant   Progression:  Resolved   Chronicity:  New Shortness of breath:    Severity:  Moderate   Onset quality:  Sudden   Duration:  1 hour   Timing:  Constant   Progression:  Resolved Risk factors comment:  H/o CAD   Past Medical History  Diagnosis Date  . Hypertension   . Heart murmur   . Nocturia   . Hyperlipidemia   . Arthritis     HANDS AND FEET  . Cataract immature BILATERAL   . Hydrocele, left   . History of AAA (abdominal aortic aneurysm) repair 2000    W/ AORTOVIFEMORAL BYPASS  . Peripheral vascular disease FOLLOWED BY DR EARLY-- VISIT NOTE 01-05-10 AND CAROTID / VASCULAR DOPPLER  RESULTS W/ CHART    ACTIVE WALKING PROGRAM  . Status post primary angioplasty with coronary stent 2002--  X2 BM STENTS  . Popliteal artery aneurysm LEFT  . Retinal detachment   . Increased intraocular pressure   . History of inguinal herniorrhaphy   . History  of diverticulosis     Noted on Colonoscopy  . Coronary artery disease CARDIOLOGIST- DR Tressia Miners TURNER-- LAST VISIT NOV  2012-- WILL REQUEST NOTE AND STRESS TEST    s/p PCI 2000  . Asymptomatic carotid artery stenosis BILATERAL     PER DOPPLER  01-05-10   RICA  1 - 03%/   LICA  40 - 50% - followed by Dr. Donnetta Hutching  . Diverticulosis   . H/O heart artery stent 02/19/00   Past Surgical History  Procedure Laterality Date  . Coronary angioplasty  2002    PTCA  W/ X2 BM STENTS--   PROXIMA LAD  &  LEFT CIRCUMFLEX TO FIRST OBTUSE MARGINAL BRANCH  . Aaa repair w/ aortobifemoral bypass  OCT  09381  . Pulley release -- right 5th finger  2009  . Exploratory laparotomy w/ enterolysis for partial sbo  09-23-2008  . Retinal detachment surgery  2004    BILATERAL  . Abdominal hernia repair  X4  (LAST ONE 1990'S)    inguinal herniorrhaphy  . Hydrocele excision  01/24/2011    Procedure: HYDROCELECTOMY ADULT;  Surgeon: Bernestine Amass, MD;  Location: [facility];  Service: Urology;  Laterality: Left;  . Tooth extraction  Dec 2013  . Carpal tunnel release Right Oct. 2013    Hand  . Ingrown toe nail Right Jan. 2015    Right Great Toe  nail  . Intest blockage  09/22/08  . Heart stents  02/19/00   Family History  Problem Relation Age of Onset  . Heart disease Mother     AAA  Before age 109  . Cancer Mother 26    Colon cancer  . Hyperlipidemia Mother   . Heart failure Father    History  Substance Use Topics  . Smoking status: Former Smoker -- 30 years    Types: Cigarettes    Quit date: 01/18/1978  . Smokeless tobacco: Never Used  . Alcohol Use: 12.6 oz/week    21 Standard drinks or equivalent per week    Review of Systems  Constitutional: Negative for fever, chills, diaphoresis, appetite change and fatigue.  Eyes: Negative for photophobia and visual disturbance.  Respiratory: Positive for shortness of breath. Negative for cough and chest tightness.   Cardiovascular: Positive for chest  pain and palpitations. Negative for leg swelling, syncope and near-syncope.  Gastrointestinal: Negative for nausea, vomiting, abdominal pain and abdominal distention.  Musculoskeletal: Positive for back pain (L upper back). Negative for neck pain and neck stiffness.  Skin: Negative for color change, pallor and rash.  Neurological: Negative for dizziness, syncope, weakness, light-headedness, numbness and headaches.  All other systems reviewed and are negative.     Allergies  Wasp venom  Home Medications   Prior to Admission medications   Medication Sig Start Date End Date Taking? Authorizing Provider  aspirin EC 81 MG tablet Take 81 mg by mouth daily.   Yes Historical Provider, MD  b complex vitamins tablet Take 1 tablet by mouth daily.     Yes Historical Provider, MD  calcium carbonate (OS-CAL) 600 MG TABS Take 600 mg by mouth daily.     Yes Historical Provider, MD  co-enzyme Q-10 30 MG capsule Take 200 mg by mouth daily.    Yes Historical Provider, MD  EPINEPHrine (EPIPEN) 0.3 mg/0.3 mL SOAJ injection Inject into the muscle as directed.   Yes Historical Provider, MD  hydrochlorothiazide (HYDRODIURIL) 25 MG tablet Take 25 mg by mouth daily.     Yes Historical Provider, MD  ibuprofen (ADVIL,MOTRIN) 200 MG tablet Take 600 mg by mouth as needed. For pain.   Yes Historical Provider, MD  losartan (COZAAR) 25 MG tablet Take 25 mg by mouth daily.   Yes Historical Provider, MD  Magnesium Hydroxide (MAGNESIA PO) Take 1,000 mg by mouth as needed.   Yes Historical Provider, MD  Multiple Vitamin (MULTIVITAMIN) tablet Take 1 tablet by mouth daily.     Yes Historical Provider, MD  Omega-3 Fatty Acids (FISH OIL) 1200 MG CAPS Take by mouth daily.   Yes Historical Provider, MD  rosuvastatin (CRESTOR) 20 MG tablet Take 20 mg by mouth every evening.    Yes Historical Provider, MD  Tamsulosin HCl (FLOMAX) 0.4 MG CAPS Take 0.4 mg by mouth daily.     Yes Historical Provider, MD  VIT C-CHOLECALCIFEROL-ROSE  HIP PO Take 2,000 mg by mouth daily.   Yes Historical Provider, MD   BP 125/66 mmHg  Pulse 67  Temp(Src) 97.4 F (36.3 C) (Oral)  Resp 16  Ht 6\' 2"  (1.88 m)  Wt 188 lb 7.9 oz (85.5 kg)  BMI 24.19 kg/m2  SpO2 100% Physical Exam  Constitutional: He is oriented to person, place, and time. He appears well-developed. No distress.  HENT:  Head: Normocephalic and atraumatic.  Mouth/Throat: Oropharynx is clear and moist.  Eyes: Conjunctivae and EOM are normal. Pupils are equal, round, and reactive to  light.  Neck: Normal range of motion. Neck supple.  Cardiovascular: Normal rate, regular rhythm, normal heart sounds and intact distal pulses.  Exam reveals no gallop and no friction rub.   No murmur heard. Pulmonary/Chest: Effort normal and breath sounds normal. No respiratory distress. He has no wheezes. He has no rales.  Abdominal: Soft. Bowel sounds are normal. He exhibits no distension. There is no tenderness. There is no guarding.  Musculoskeletal: Normal range of motion. He exhibits no edema or tenderness.  Neurological: He is oriented to person, place, and time. GCS eye subscore is 4. GCS verbal subscore is 5. GCS motor subscore is 6.  Skin: Skin is dry. No rash noted. He is not diaphoretic. No erythema. No pallor.  Nursing note and vitals reviewed.   ED Course  Procedures (including critical care time) Labs Review Labs Reviewed  CBC - Abnormal; Notable for the following:    RBC 4.12 (*)    All other components within normal limits  BASIC METABOLIC PANEL - Abnormal; Notable for the following:    Sodium 133 (*)    Chloride 95 (*)    All other components within normal limits  BRAIN NATRIURETIC PEPTIDE - Abnormal; Notable for the following:    B Natriuretic Peptide 132.7 (*)    All other components within normal limits  HEMOGLOBIN A1C - Abnormal; Notable for the following:    Hgb A1c MFr Bld 5.7 (*)    All other components within normal limits  MRSA PCR SCREENING  TROPONIN I   TSH  LIPID PANEL  I-STAT TROPOININ, ED  Randolm Idol, ED    Imaging Review Dg Chest 2 View  06/10/2014   CLINICAL DATA:  Palpitations, hypertension  EXAM: CHEST  2 VIEW  COMPARISON:  12/06/2005  FINDINGS: Cardiomediastinal silhouette is stable. No acute infiltrate or pleural effusion. No pulmonary edema. Osteopenia and degenerative changes thoracic spine.  IMPRESSION: No active cardiopulmonary disease.   Electronically Signed   By: Lahoma Crocker M.D.   On: 06/10/2014 17:43   Nm Myocar Multi W/spect W/wall Motion / Ef  06/11/2014    The study is normal.  Diaphragmatic attenuation of the inferior wall.     EKG Interpretation   Date/Time:  Tuesday Jun 10 2014 20:35:08 EDT Ventricular Rate:  58 PR Interval:  388 QRS Duration: 129 QT Interval:  433 QTC Calculation: 425 R Axis:   -45 Text Interpretation:  Sinus or ectopic atrial rhythm new Prolonged PR  interval Nonspecific IVCD with LAD Probable anteroseptal infarct, old  Confirmed by [facility]  MD, WHITNEY ([id number]) on 06/10/2014 8:44:12 PM      MDM   Final diagnoses:  ACS (acute coronary syndrome)    74 yo M with PMH of CAD s/p cardiac stent x 2, AAA s/p repair, HTN, HPL, presenting with chest pain, palpitations, SOB.  Pt reports onset of symptoms while working in yard today.  Felt very SOB with heart racing.  Checked pulse at measured HR of 115.  Also had pain in L shoulder during this time.  Came inside to rest and HR went down to 105- again tried to do more work outside but had persistent symptoms and HR remained elevated so came to ED.  On arrival here he reports symptoms have resolved.  He denies dizziness, fever, cough, syncope.    On presentation, pt alert, VSS, in NAD.  Initial HR 104 but on my eval HR 50s-70s.  Normotensive, vitals otherwise stable.  CV exam WNL.  Lungs CTAB.  No other acute findings.  Initial EKG obtained shows accelerated junctional rhythm, HR 106- new compared to priors;  No acute ischemic changes.  Repeat EKG when HR normalized shows extremely long PR interval which is also new compared to priors.  No new medications, no B-B use.  Will assess troponin, CXR, electrolytes.    Lab results WNL.  Troponin negative.  CXR clear.  Pt remains asymptomatic.  However due to EKG findings and pt's high risk for ACS, Cardiology consulted- they will admit for further management.  No other acute events during my care.  Discussed with attendiing Dr. Maryan Rued.    Ellwood Dense, MD 06/12/14 1306  Blanchie Dessert, MD 06/12/14 [id number]

## 2014-06-11 ENCOUNTER — Inpatient Hospital Stay (HOSPITAL_COMMUNITY): Payer: Medicare Other

## 2014-06-11 ENCOUNTER — Other Ambulatory Visit (HOSPITAL_COMMUNITY): Payer: Self-pay | Admitting: Physician Assistant

## 2014-06-11 DIAGNOSIS — I724 Aneurysm of artery of lower extremity: Secondary | ICD-10-CM | POA: Diagnosis present

## 2014-06-11 DIAGNOSIS — E785 Hyperlipidemia, unspecified: Secondary | ICD-10-CM | POA: Diagnosis present

## 2014-06-11 DIAGNOSIS — I200 Unstable angina: Secondary | ICD-10-CM | POA: Diagnosis not present

## 2014-06-11 DIAGNOSIS — Z87891 Personal history of nicotine dependence: Secondary | ICD-10-CM | POA: Diagnosis not present

## 2014-06-11 DIAGNOSIS — I249 Acute ischemic heart disease, unspecified: Secondary | ICD-10-CM | POA: Diagnosis present

## 2014-06-11 DIAGNOSIS — I739 Peripheral vascular disease, unspecified: Secondary | ICD-10-CM | POA: Diagnosis present

## 2014-06-11 DIAGNOSIS — Z95828 Presence of other vascular implants and grafts: Secondary | ICD-10-CM | POA: Diagnosis not present

## 2014-06-11 DIAGNOSIS — R002 Palpitations: Secondary | ICD-10-CM | POA: Diagnosis not present

## 2014-06-11 DIAGNOSIS — R011 Cardiac murmur, unspecified: Secondary | ICD-10-CM | POA: Diagnosis present

## 2014-06-11 DIAGNOSIS — Z8249 Family history of ischemic heart disease and other diseases of the circulatory system: Secondary | ICD-10-CM | POA: Diagnosis not present

## 2014-06-11 DIAGNOSIS — I2510 Atherosclerotic heart disease of native coronary artery without angina pectoris: Secondary | ICD-10-CM | POA: Diagnosis present

## 2014-06-11 DIAGNOSIS — I440 Atrioventricular block, first degree: Secondary | ICD-10-CM | POA: Diagnosis present

## 2014-06-11 DIAGNOSIS — Z955 Presence of coronary angioplasty implant and graft: Secondary | ICD-10-CM | POA: Diagnosis not present

## 2014-06-11 DIAGNOSIS — Z8679 Personal history of other diseases of the circulatory system: Secondary | ICD-10-CM | POA: Diagnosis not present

## 2014-06-11 DIAGNOSIS — I6523 Occlusion and stenosis of bilateral carotid arteries: Secondary | ICD-10-CM | POA: Diagnosis present

## 2014-06-11 DIAGNOSIS — I10 Essential (primary) hypertension: Secondary | ICD-10-CM | POA: Diagnosis present

## 2014-06-11 DIAGNOSIS — Z91038 Other insect allergy status: Secondary | ICD-10-CM | POA: Diagnosis not present

## 2014-06-11 LAB — LIPID PANEL
CHOL/HDL RATIO: 2.1 ratio
Cholesterol: 160 mg/dL (ref 0–200)
HDL: 77 mg/dL (ref >40)
LDL Cholesterol: 74 mg/dL (ref 0–99)
TRIGLYCERIDES: 45 mg/dL (ref <150)
VLDL: 9 mg/dL (ref 0–40)

## 2014-06-11 LAB — NM MYOCAR MULTI W/SPECT W/WALL MOTION / EF
CHL CUP NUCLEAR SRS: 8
LHR: 0.3
LV sys vol: 29 mL
LVDIAVOL: 81 mL
Nuc Stress EF: 65 %
SDS: 0
SSS: 8
TID: 0.78

## 2014-06-11 LAB — TROPONIN I

## 2014-06-11 LAB — TSH: TSH: 1.534 u[IU]/mL (ref 0.350–4.500)

## 2014-06-11 LAB — MRSA PCR SCREENING: MRSA BY PCR: NEGATIVE

## 2014-06-11 MED ORDER — NITROGLYCERIN 0.4 MG SL SUBL: 0.4000 mg | SUBLINGUAL_TABLET | SUBLINGUAL | Status: DC | PRN

## 2014-06-11 MED ORDER — TECHNETIUM TC 99M SESTAMIBI GENERIC - CARDIOLITE
30.0000 | Freq: Once | INTRAVENOUS | Status: AC | PRN
  Administered 2014-06-11: 30 via INTRAVENOUS

## 2014-06-11 MED ORDER — TAMSULOSIN HCL 0.4 MG PO CAPS
0.4000 mg | ORAL_CAPSULE | Freq: Every day | ORAL | Status: DC
  Filled 2014-06-11: qty 1

## 2014-06-11 MED ORDER — ASPIRIN EC 81 MG PO TBEC: 81.0000 mg | DELAYED_RELEASE_TABLET | Freq: Every day | ORAL | Status: DC

## 2014-06-11 MED ORDER — HEPARIN SODIUM (PORCINE) 5000 UNIT/ML IJ SOLN
5000.0000 [IU] | Freq: Three times a day (TID) | INTRAMUSCULAR | Status: DC
  Administered 2014-06-11: 5000 [IU] via SUBCUTANEOUS
  Filled 2014-06-11 (×4): qty 1

## 2014-06-11 MED ORDER — ACETAMINOPHEN 325 MG PO TABS: 650.0000 mg | ORAL_TABLET | ORAL | Status: DC | PRN

## 2014-06-11 MED ORDER — ROSUVASTATIN CALCIUM 20 MG PO TABS
20.0000 mg | ORAL_TABLET | Freq: Every evening | ORAL | Status: DC
  Filled 2014-06-11: qty 1

## 2014-06-11 MED ORDER — SODIUM CHLORIDE 0.9 % IV SOLN
INTRAVENOUS | Status: AC
Start: 2014-06-11 — End: 2014-06-11
  Administered 2014-06-11: 02:00:00 via INTRAVENOUS

## 2014-06-11 MED ORDER — TECHNETIUM TC 99M SESTAMIBI GENERIC - CARDIOLITE
10.0000 | Freq: Once | INTRAVENOUS | Status: AC | PRN
  Administered 2014-06-11: 10 via INTRAVENOUS

## 2014-06-11 MED ORDER — REGADENOSON 0.4 MG/5ML IV SOLN
INTRAVENOUS | Status: AC
  Filled 2014-06-11: qty 5

## 2014-06-11 MED ORDER — ONDANSETRON HCL 4 MG/2ML IJ SOLN: 4.0000 mg | Freq: Four times a day (QID) | INTRAMUSCULAR | Status: DC | PRN

## 2014-06-11 MED ORDER — LOSARTAN POTASSIUM 25 MG PO TABS
25.0000 mg | ORAL_TABLET | Freq: Every day | ORAL | Status: DC
  Administered 2014-06-11: 25 mg via ORAL
  Filled 2014-06-11: qty 1

## 2014-06-11 NOTE — Care Management Note (Signed)
Case Management Note  Patient Details  Name: [name] MRN: [id number] Date of Birth: [birth year]  Subjective/Objective:     Adm w palpitations               Action/Plan:lives w wife, pcp dr Herbie [place] gates   Expected Discharge Date:    06/12/14              Expected Discharge Plan:  Home/Self Care  In-House Referral:     Discharge planning Services     Post Acute Care Choice:    Choice offered to:     DME Arranged:    DME Agency:     HH Arranged:    [place] Agency:     Status of Service:     Medicare Important Message Given:    Date Medicare IM Given:    Medicare IM give by:    Date Additional Medicare IM Given:    Additional Medicare Important Message give by:     If discussed at [place] of Stay Meetings, dates discussed:    Additional Comments: ur review done.  Lacretia Leigh, RN 06/11/2014, 8:44 AM

## 2014-06-11 NOTE — Progress Notes (Signed)
As outlined in fellow note; Patient seen and examined; presented with palpitations; no chest pain, dyspnea or syncope. Initial ECG showed sinus tachycardia with first degree AV block; no ST changes; enzymes negative. Plan stress nuclear study today. If negative will discharge on preadmission medications and plan outpatient CardioNet. He would then follow up with Dr. Radford Pax. I find no documentation of significant arrhythmias. Kirk Ruths

## 2014-06-11 NOTE — Progress Notes (Signed)
The patient tolerated the treadmill very well.  Target rate achieved.  He did develop right leg claudication as expected. Known PAD.  Jhalil Silvera, PAC

## 2014-06-11 NOTE — Discharge Summary (Signed)
Physician Discharge Summary     Cardiologist:  Tuner Patient ID: [name] MRN: [id number] DOB/AGE: 03/16/1940 73 y.o.  Admit date: 06/10/2014 Discharge date: 06/11/2014  Admission Diagnoses:  palpitations  Discharge Diagnoses:  Active Problems:   ACS (acute coronary syndrome)   Discharged Condition: stable  Hospital Course:   [name] is a 74 y.o. male who presented for evaluation of palpitations. 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 Radford Pax. He was last seen 10/2012 in clinic with no acute problems or regimen changes. He awoke in his usual state of health with no symptoms. Around 2:30pm, while doing yard work, patient felt his heart racing after 15 minutes. He stopped to rest and heart "calmed down" after around 10 minutes. He returned to doing yard work and heart racing came back 3 minutes later. He went inside and checked his HR on BP machine and it was around 115 which is not normal for patient. After 58mins to and hour HR was still around 105. His wife came home around 108 and he then came into the ED to be evaluated. She reports that he looked pale/gray and had increased work of breathing. He denied any chest pain, but did report left ache over his shoulder blade which last 30 minutes and spontaneously resolved. In the ED he had no chest pain and negative troponins. His initial ekg revealed suspected accelerated junctional rhythm which is new significant change.  Previous diagnostic testing for coronary artery disease includes: cardiac catheterization and stress thallium. Previous history of cardiac disease includes Angina, Coronary Artery Disease, Coronary Artery Stent, Ischemic Heart Disease.  Coronary artery disease risk factors include: advanced age (older than 39 for men, 15 for  women), dyslipidemia, hypertension, male gender and smoking/ tobacco exposure. Patient denies history of cardiomyopathy, CHF, previous M.I. and valvular disease.  He was admitted for observation and ruled out for MI.  Treadmill Cardiolite was normal.  He did have a very short period of trigeminy prior to starting the treadmill.  No acute changes on EKG.  TSH was WNL.   No medication changes were made.  He will have a cardio net monitor scheduled in the next couple of days.  The patient was seen by Dr. Stanford Breed who felt he was stable for DC home.   Consults: None  Significant Diagnostic Studies:  Treadmill cardiolite:  Normal  Treatments: See above  Discharge Exam: Blood pressure 125/66, pulse 67, temperature 97.4 F (36.3 C), temperature source Oral, resp. rate 16, height 6\' 2"  (1.88 m), weight 188 lb 7.9 oz (85.5 kg), SpO2 100 %.   Disposition: 01-Home or Self Care      Discharge Instructions    Diet - low sodium heart healthy    Complete by:  As directed             Medication List    TAKE these medications        aspirin EC 81 MG tablet  Take 81 mg by mouth daily.     b complex vitamins tablet  Take 1 tablet by mouth daily.     calcium carbonate 600 MG Tabs tablet  Commonly known as:  OS-CAL  Take 600 mg by mouth daily.     co-enzyme Q-10 30 MG capsule  Take 200 mg by mouth daily.     EPIPEN 0.3 mg/0.3  mL Soaj injection  Generic drug:  EPINEPHrine  Inject into the muscle as directed.     Fish Oil 1200 MG Caps  Take by mouth daily.     hydrochlorothiazide 25 MG tablet  Commonly known as:  HYDRODIURIL  Take 25 mg by mouth daily.     ibuprofen 200 MG tablet  Commonly known as:  ADVIL,MOTRIN  Take 600 mg by mouth as needed. For pain.     losartan 25 MG tablet  Commonly known as:  COZAAR  Take 25 mg by mouth daily.     MAGNESIA PO  Take 1,000 mg by mouth as needed.     multivitamin tablet  Take 1 tablet by mouth daily.     rosuvastatin 20 MG tablet    Commonly known as:  CRESTOR  Take 20 mg by mouth every evening.     tamsulosin 0.4 MG Caps capsule  Commonly known as:  FLOMAX  Take 0.4 mg by mouth daily.     VIT C-CHOLECALCIFEROL-ROSE HIP PO  Take 2,000 mg by mouth daily.       Follow-up Information    Follow up with Truitt Merle, NP On 07/29/2014.   Specialties:  Nurse Practitioner, Interventional Cardiology, Cardiology, Radiology   Why:  10:00 AM   Contact information:   [address] [place] [address] [phone]       Follow up with Cardiac monitor.   Why:  The office will call you with the date to pick up theheart monitor.    Contact information:   [address] [place]  [address] [phone]     Greater than 30 minutes was spent completing the patient's discharge.   SignedTarri Fuller, [place] 06/11/2014, 4:32 PM

## 2014-06-11 NOTE — H&P (Signed)
Cardiology History and Physical  PCP: Henrine Screws, MD Cardiologist: Dr. Fransico Him  History of Present Illness (and review of medical records): [name] is a 74 y.o. male who presents for evaluation of palpitations.  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 Radford Pax.  He was last seen 10/2012 in clinic with no acute problems or regimen changes.  He awoke in his usual state of health with no symptoms.  Around 2:30pm, while doing yard work, patient felt his heart racing after 15 minutes.  He stopped to rest and heart "calmed down" after around 10 minutes.  He returned to doing yard work and heart racing came back 3 minutes later.  He went inside and checked his HR on BP machine and it was around 115 which is not normal for patient.  After 32mns to and hour HR was still around 105.  His wife came home around 415and he then came into the ED to be evaluated.  She reports that he looked pale/gray and had increased work of breathing.  He denied any chest pain, but did report left ache over his shoulder blade which last 30 minutes and spontaneously resolved.  In the ED he had no chest pain and negative troponins.  His initial ekg revealed suspected accelerated junctional rhythm which is new significant change.  Previous diagnostic testing for coronary artery disease includes: cardiac catheterization and stress thallium. Previous history of cardiac disease includes Angina, Coronary Artery Disease, Coronary Artery Stent, Ischemic Heart Disease. oronary artery disease risk factors include: advanced age (older than 572for men, 641for women), dyslipidemia, hypertension, male gender and smoking/ tobacco exposure.  Patient denies history of cardiomyopathy, CHF, previous M.I. and valvular disease.  Review of Systems Further  review of systems was otherwise negative other than stated in HPI.  Past Medical History  Diagnosis Date  . Hypertension   . Heart murmur   . Nocturia   . Hyperlipidemia   . Arthritis     HANDS AND FEET  . Cataract immature BILATERAL   . Hydrocele, left   . History of AAA (abdominal aortic aneurysm) repair 2000    W/ AORTOVIFEMORAL BYPASS  . Peripheral vascular disease FOLLOWED BY DR EARLY-- VISIT NOTE 01-05-10 AND CAROTID / VASCULAR DOPPLER  RESULTS W/ CHART    ACTIVE WALKING PROGRAM  . Status post primary angioplasty with coronary stent 2002--  X2 BM STENTS  . Popliteal artery aneurysm LEFT  . Retinal detachment   . Increased intraocular pressure   . History of inguinal herniorrhaphy   . History of diverticulosis     Noted on Colonoscopy  . Coronary artery disease CARDIOLOGIST- DR TTressia MinersTURNER-- LAST VISIT NOV  2012-- WILL REQUEST NOTE AND STRESS TEST    s/p PCI 2000  . Asymptomatic carotid artery stenosis BILATERAL     PER DOPPLER  01-05-10   RICA  1 - 316%/  LICA  40 - 510%- followed by Dr. EDonnetta Hutching . Diverticulosis   . H/O heart artery stent 02/19/00    Past Surgical History  Procedure Laterality Date  . Coronary angioplasty  2002    PTCA  W/ X2 BM STENTS--   PROXIMA LAD  &  LEFT CIRCUMFLEX TO FIRST OBTUSE MARGINAL BRANCH  . Aaa repair w/ aortobifemoral bypass  OCT  296045 . Pulley release --  right 5th finger  2009  . Exploratory laparotomy w/ enterolysis for partial sbo  09-23-2008  . Retinal detachment surgery  2004    BILATERAL  . Abdominal hernia repair  X4  (LAST ONE 1990'S)    inguinal herniorrhaphy  . Hydrocele excision  01/24/2011    Procedure: HYDROCELECTOMY ADULT;  Surgeon: Bernestine Amass, MD;  Location: [facility];  Service: Urology;  Laterality: Left;  . Tooth extraction  Dec 2013  . Carpal tunnel release Right Oct. 2013    Hand  . Ingrown toe nail Right Jan. 2015    Right Great Toe nail  . Intest blockage  09/22/08  . Heart stents  02/19/00      (Not in a hospital admission) Allergies  Allergen Reactions  . Wasp Venom Shortness Of Breath and Swelling    History  Substance Use Topics  . Smoking status: Former Smoker -- 30 years    Types: Cigarettes    Quit date: 01/18/1978  . Smokeless tobacco: Never Used  . Alcohol Use: 12.6 oz/week    21 Standard drinks or equivalent per week    Family History  Problem Relation Age of Onset  . Heart disease Mother     AAA  Before age 45  . Cancer Mother 40    Colon cancer  . Hyperlipidemia Mother   . Heart failure Father      Objective:  Patient Vitals for the past 8 hrs:  BP Temp Temp src Pulse Resp SpO2  06/10/14 2345 133/69 mmHg - - (!) 52 13 97 %  06/10/14 2330 139/67 mmHg - - (!) 52 12 98 %  06/10/14 2300 144/71 mmHg - - (!) 55 - 100 %  06/10/14 2244 148/71 mmHg - - (!) 55 14 100 %  06/10/14 2230 148/71 mmHg - - (!) 53 12 100 %  06/10/14 2200 151/76 mmHg - - (!) 52 14 100 %  06/10/14 2130 145/72 mmHg - - (!) 52 13 100 %  06/10/14 2100 139/67 mmHg - - (!) 56 12 100 %  06/10/14 2000 148/70 mmHg 97.5 F (36.4 C) Oral 74 15 100 %  06/10/14 1704 117/83 mmHg - - 104 14 99 %   General appearance: alert, cooperative, appears stated age and no distress Head: Normocephalic, without obvious abnormality, atraumatic Eyes: conjunctivae/corneas clear. PERRL, EOM's intact. Neck: left carotid bruit, no JVD and supple, Lungs: clear to auscultation bilaterally Chest wall: no tenderness Heart: regular rate and rhythm, S1, S2 normal, no murmur Abdomen: soft, non-tender; bowel sounds normal Extremities: extremities normal, atraumatic, no cyanosis or edema Pulses: 2+ and symmetric Neurologic: Grossly normal  Results for orders placed or performed during the hospital encounter of 06/10/14 (from the past 48 hour(s))  CBC     Status: Abnormal   Collection Time: 06/10/14  5:23 PM  Result Value Ref Range   WBC 6.3 4.0 - 10.5 K/uL   RBC 4.12 (L) 4.22 - 5.81 MIL/uL   Hemoglobin 14.0  13.0 - 17.0 g/dL   HCT 39.9 39.0 - 52.0 %   MCV 96.8 78.0 - 100.0 fL   MCH 34.0 26.0 - 34.0 pg   MCHC 35.1 30.0 - 36.0 g/dL   RDW 12.5 11.5 - 15.5 %   Platelets 177 150 - 400 K/uL  Basic metabolic panel     Status: Abnormal   Collection Time: 06/10/14  5:23 PM  Result Value Ref Range   Sodium 133 (L) 135 - 145 mmol/L   Potassium  3.6 3.5 - 5.1 mmol/L   Chloride 95 (L) 101 - 111 mmol/L   CO2 28 22 - 32 mmol/L   Glucose, Bld 87 65 - 99 mg/dL   BUN 18 6 - 20 mg/dL   Creatinine, Ser 1.08 0.61 - 1.24 mg/dL   Calcium 9.8 8.9 - 10.3 mg/dL   GFR calc non Af Amer >60 >60 mL/min   GFR calc Af Amer >60 >60 mL/min    Comment: (NOTE) The eGFR has been calculated using the CKD EPI equation. This calculation has not been validated in all clinical situations. eGFR's persistently <60 mL/min signify possible Chronic Kidney Disease.    Anion gap 10 5 - 15  BNP (order ONLY if patient complains of dyspnea/SOB AND you have documented it for THIS visit)     Status: Abnormal   Collection Time: 06/10/14  5:23 PM  Result Value Ref Range   B Natriuretic Peptide 132.7 (H) 0.0 - 100.0 pg/mL  I-stat troponin, ED  (not at [facility])     Status: None   Collection Time: 06/10/14  5:29 PM  Result Value Ref Range   Troponin i, poc 0.01 0.00 - 0.08 ng/mL   Comment 3            Comment: Due to the release kinetics of cTnI, a negative result within the first hours of the onset of symptoms does not rule out myocardial infarction with certainty. If myocardial infarction is still suspected, repeat the test at appropriate intervals.   I-stat troponin, ED     Status: None   Collection Time: 06/10/14  9:42 PM  Result Value Ref Range   Troponin i, poc 0.00 0.00 - 0.08 ng/mL   Comment 3            Comment: Due to the release kinetics of cTnI, a negative result within the first hours of the onset of symptoms does not rule out myocardial infarction with certainty. If myocardial infarction is still  suspected, repeat the test at appropriate intervals.    Dg Chest 2 View  06/10/2014   CLINICAL DATA:  Palpitations, hypertension  EXAM: CHEST  2 VIEW  COMPARISON:  12/06/2005  FINDINGS: Cardiomediastinal silhouette is stable. No acute infiltrate or pleural effusion. No pulmonary edema. Osteopenia and degenerative changes thoracic spine.  IMPRESSION: No active cardiopulmonary disease.   Electronically Signed   By: Lahoma Crocker M.D.   On: 06/10/2014 17:43    ECG: 5pm HR accelerated junctional rhythm with likely old anteroseptal infarct  830 pm sinus brady with prolonged PR interval 388, NS IVCD with LAD, anteroseptal infarct likely old.  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  Palpitations with exertion maybe atypical presentation of unstable angina.  Plan: 1. Cardiology Admission  2. Continuous monitoring on Telemetry. 3. Repeat ekg on admit, prn chest pain or arrythmia 4. Trend cardiac biomarkers, check lipids, hgba1c, tsh 5. Medical management to include ASA,ARB, Statin, NTG prn 6. Keep NPO, gentle IVFs 7. Will reassess rhythm and symptoms in am.  May need further ischemic evaluation given known disease with no recent assessment and current symptoms along with ekg changes. Discussed with patient non-invasive stress vs Cath.  Patient agreeable to proceed with ischemic  work up.

## 2014-06-11 NOTE — ED Notes (Signed)
Spouse left contact info: Rosendo Gros, cell: [phone], home [phone].

## 2014-06-12 LAB — HEMOGLOBIN A1C
Hgb A1c MFr Bld: 5.7 % — ABNORMAL HIGH (ref 4.8–5.6)
MEAN PLASMA GLUCOSE: 117 mg/dL

## 2014-06-24 ENCOUNTER — Ambulatory Visit (INDEPENDENT_AMBULATORY_CARE_PROVIDER_SITE_OTHER): Payer: Medicare Other

## 2014-06-24 ENCOUNTER — Other Ambulatory Visit (HOSPITAL_COMMUNITY): Payer: Self-pay | Admitting: Physician Assistant

## 2014-06-24 DIAGNOSIS — R42 Dizziness and giddiness: Secondary | ICD-10-CM | POA: Diagnosis not present

## 2014-06-24 DIAGNOSIS — R002 Palpitations: Secondary | ICD-10-CM

## 2014-06-30 ENCOUNTER — Telehealth: Payer: Self-pay | Admitting: Cardiology

## 2014-06-30 DIAGNOSIS — I480 Paroxysmal atrial fibrillation: Secondary | ICD-10-CM

## 2014-06-30 HISTORY — DX: Paroxysmal atrial fibrillation: I48.0

## 2014-06-30 MED ORDER — APIXABAN 5 MG PO TABS: 5.0000 mg | ORAL_TABLET | Freq: Two times a day (BID) | ORAL | Status: AC

## 2014-06-30 MED ORDER — APIXABAN 5 MG PO TABS
5.0000 mg | ORAL_TABLET | Freq: Two times a day (BID) | ORAL | Status: DC
Start: 2014-06-30 — End: 2014-06-30

## 2014-06-30 NOTE — Telephone Encounter (Signed)
Jarrett Soho from [employer] calling to inform Dr Radford Pax and nurse that the pt had an episode of new onset afib for a couple of minutes with a HR of 110.  Jarrett Soho from [employer] confirmed 2 fax numbers from our office to have this report sent to [place] for Dr Radford Pax to review.  Pt was not contacted by the company.  Contacted the pt and he states he just "feels a little anxious."  Pt denies any cp or sob.  Pt states he did play golf today, and exerted quite a bit.  Pt states he did stay plenty of hydrated and ate a good lunch.  Pt states he was asymptomatic while he was playing golf. Pt states he did take his HR an hour ago and his HR was 90 bpm and BP was 128/78.  Will inform Dr Radford Pax and nurse of event and have monitor tech pull this.

## 2014-06-30 NOTE — Telephone Encounter (Signed)
New Message     Marcus Feil from [employer] is calling because the pt is having an abnormal EKG READING @ 3:01 central time

## 2014-06-30 NOTE — Telephone Encounter (Addendum)
Received copies of rhythm strips. Per Dr. Tamala Julian (DOD), instructed patient to START ELIQUIS 5 mg BID.  Patient has OV with Truitt Merle 7/12.  Samples placed at the front desk for patient pickup.   To Dr. Radford Pax for review.

## 2014-07-14 ENCOUNTER — Other Ambulatory Visit: Payer: Self-pay

## 2014-07-29 ENCOUNTER — Encounter: Payer: Self-pay | Admitting: *Deleted

## 2014-07-29 ENCOUNTER — Ambulatory Visit (INDEPENDENT_AMBULATORY_CARE_PROVIDER_SITE_OTHER): Payer: Medicare Other | Admitting: Nurse Practitioner

## 2014-07-29 ENCOUNTER — Encounter: Payer: Self-pay | Admitting: Nurse Practitioner

## 2014-07-29 VITALS — BP 140/70 | HR 58 | Ht 73.75 in | Wt 190.1 lb

## 2014-07-29 DIAGNOSIS — I10 Essential (primary) hypertension: Secondary | ICD-10-CM | POA: Diagnosis not present

## 2014-07-29 DIAGNOSIS — Z7901 Long term (current) use of anticoagulants: Secondary | ICD-10-CM

## 2014-07-29 DIAGNOSIS — I2510 Atherosclerotic heart disease of native coronary artery without angina pectoris: Secondary | ICD-10-CM

## 2014-07-29 DIAGNOSIS — I480 Paroxysmal atrial fibrillation: Secondary | ICD-10-CM | POA: Diagnosis not present

## 2014-07-29 DIAGNOSIS — E785 Hyperlipidemia, unspecified: Secondary | ICD-10-CM

## 2014-07-29 LAB — CBC
HCT: 40.9 % (ref 39.0–52.0)
Hemoglobin: 13.9 g/dL (ref 13.0–17.0)
MCHC: 33.9 g/dL (ref 30.0–36.0)
MCV: 99.8 fl (ref 78.0–100.0)
Platelets: 186 10*3/uL (ref 150.0–400.0)
RBC: 4.1 Mil/uL — ABNORMAL LOW (ref 4.22–5.81)
RDW: 13.3 % (ref 11.5–15.5)
WBC: 3.8 10*3/uL — ABNORMAL LOW (ref 4.0–10.5)

## 2014-07-29 LAB — BASIC METABOLIC PANEL
BUN: 31 mg/dL — ABNORMAL HIGH (ref 6–23)
CO2: 32 mEq/L (ref 19–32)
Calcium: 9.8 mg/dL (ref 8.4–10.5)
Chloride: 97 mEq/L (ref 96–112)
Creatinine, Ser: 1.13 mg/dL (ref 0.40–1.50)
GFR: 67.13 mL/min (ref >60.00)
Glucose, Bld: 78 mg/dL (ref 70–99)
Potassium: 4.3 mEq/L (ref 3.5–5.1)
Sodium: 136 mEq/L (ref 135–145)

## 2014-07-29 NOTE — Progress Notes (Signed)
Patient ID: [name], male   DOB: March 02, 1940, 74 y.o.   MRN: [id number] Patient forgot to return charging cord with cardiac event monitor and brought it to our office. [employer] representative notified and will pick up at our office 08/01/2014.

## 2014-07-29 NOTE — Progress Notes (Signed)
CARDIOLOGY OFFICE NOTE  Date:  07/29/2014    [name] Date of Birth: [birth year] Medical Record #[id number]  PCP:  Henrine Screws, MD  Cardiologist:  Radford Pax    Chief Complaint  Patient presents with  . Palpitations    Post hospital visit -seen for Dr. Radford Pax.     History of Present Illness: [name] is a 74 y.o. male who presents today for a post hospital visit. Seen for Dr. Radford Pax. 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   Last seen by Dr. Radford Pax back in 2014.   Presented back in May to the hospital with complaints of palpitations.  In the ED he had no chest pain and negative troponins. His initial ekg revealed suspected accelerated junctional rhythm which is new. He was admitted for observation and ruled out for MI. Treadmill Cardiolite was normal. He did have a very short period of trigeminy prior to starting the treadmill. No acute changes on EKG. TSH was WNL. No medication changes were made. He had an event monitor placed - this subsequently showed PAF - was started on anticoagulation with Eliquis.   Comes in today. Here alone. Doing ok. He is not happy that he has not seen Dr. Radford Pax in almost 2 years. He has stopped his caffeine intake. Does have almost daily alcohol use (vodka). No chest pain. Breathing is ok. Seeing Dr. Inda Merlin later today - has a pain in R lumbar area - worse with deep breathing. Using NSAIDs with relief - did not realize that increases his risk of bleeding. No falls. No cough.   Past Medical History  Diagnosis Date  . Hypertension   . Heart murmur   . Nocturia   . Hyperlipidemia   . Arthritis     HANDS AND FEET  . Cataract immature BILATERAL   . Hydrocele, left   . History of AAA (abdominal aortic aneurysm) repair 2000    W/ AORTOVIFEMORAL BYPASS  .  Peripheral vascular disease FOLLOWED BY DR EARLY-- VISIT NOTE 01-05-10 AND CAROTID / VASCULAR DOPPLER  RESULTS W/ CHART    ACTIVE WALKING PROGRAM  . Status post primary angioplasty with coronary stent 2002--  X2 BM STENTS  . Popliteal artery aneurysm LEFT  . Retinal detachment   . Increased intraocular pressure   . History of inguinal herniorrhaphy   . History of diverticulosis     Noted on Colonoscopy  . Coronary artery disease CARDIOLOGIST- DR Tressia Miners TURNER-- LAST VISIT NOV  2012-- WILL REQUEST NOTE AND STRESS TEST    s/p PCI 2000  . Asymptomatic carotid artery stenosis BILATERAL     PER DOPPLER  01-05-10   RICA  1 - 13%/   LICA  40 - 24% - followed by Dr. Donnetta Hutching  . Diverticulosis   . H/O heart artery stent 02/19/00    Past Surgical History  Procedure Laterality Date  . Coronary angioplasty  2002    PTCA  W/ X2 BM STENTS--   PROXIMA LAD  &  LEFT CIRCUMFLEX TO FIRST OBTUSE MARGINAL BRANCH  . Aaa repair w/ aortobifemoral bypass  OCT  40102  . Pulley release -- right 5th finger  2009  . Exploratory laparotomy w/ enterolysis for partial sbo  09-23-2008  . Retinal detachment surgery  2004    BILATERAL  . Abdominal hernia repair  X4  (  LAST ONE 1990'S)    inguinal herniorrhaphy  . Hydrocele excision  01/24/2011    Procedure: HYDROCELECTOMY ADULT;  Surgeon: Bernestine Amass, MD;  Location: [facility];  Service: Urology;  Laterality: Left;  . Tooth extraction  Dec 2013  . Carpal tunnel release Right Oct. 2013    Hand  . Ingrown toe nail Right Jan. 2015    Right Great Toe nail  . Intest blockage  09/22/08  . Heart stents  02/19/00     Medications: Current Outpatient Prescriptions  Medication Sig Dispense Refill  . apixaban (ELIQUIS) 5 MG TABS tablet Take 1 tablet (5 mg total) by mouth 2 (two) times daily. 60 tablet 6  . aspirin EC 81 MG tablet Take 81 mg by mouth daily.    [name] b complex vitamins tablet Take 1 tablet by mouth daily.      . calcium carbonate (OS-CAL) 600 MG  TABS Take 600 mg by mouth daily.      [name] co-enzyme Q-10 30 MG capsule Take 200 mg by mouth daily.     [name] EPINEPHrine (EPIPEN) 0.3 mg/0.3 mL SOAJ injection Inject into the muscle as directed.    . hydrochlorothiazide (HYDRODIURIL) 25 MG tablet Take 25 mg by mouth daily.      [name] ibuprofen (ADVIL,MOTRIN) 200 MG tablet Take 600 mg by mouth as needed. For pain.    [name] losartan (COZAAR) 25 MG tablet Take 25 mg by mouth daily.    . Magnesium Hydroxide (MAGNESIA PO) Take 1,000 mg by mouth as needed (for digestion).     . Multiple Vitamin (MULTIVITAMIN) tablet Take 1 tablet by mouth daily.      . Omega-3 Fatty Acids (FISH OIL) 1200 MG CAPS Take by mouth daily.    . rosuvastatin (CRESTOR) 20 MG tablet Take 20 mg by mouth every evening.     . Tamsulosin HCl (FLOMAX) 0.4 MG CAPS Take 0.4 mg by mouth daily.      [name] VIT C-CHOLECALCIFEROL-ROSE HIP PO Take 2,000 mg by mouth daily.     No current facility-administered medications for this visit.    Allergies: Allergies  Allergen Reactions  . Wasp Venom Shortness Of Breath and Swelling    Social History: The patient  reports that he quit smoking about 36 years ago. His smoking use included Cigarettes. He quit after 30 years of use. He has never used smokeless tobacco. He reports that he drinks about 12.6 oz of alcohol per week. He reports that he does not use illicit drugs.   Family History: The patient's family history includes Cancer (age of onset: 54) in his mother; Heart disease in his mother; Heart failure in his father; Hyperlipidemia in his mother.   Review of Systems: Please see the history of present illness.   Otherwise, the review of systems is positive for none.   All other systems are reviewed and negative.   Physical Exam: VS:  BP 140/70 mmHg  Pulse 58  Ht 6' 1.75" (1.873 m)  Wt 190 lb 1.9 oz (86.238 kg)  BMI 24.58 kg/m2  SpO2 97% .  BMI Body mass index is 24.58 kg/(m^2).  Wt Readings from Last 3 Encounters:  07/29/14 190 lb 1.9 oz  (86.238 kg)  06/11/14 188 lb 7.9 oz (85.5 kg)  03/18/14 193 lb (87.544 kg)    General: Pleasant. Little anxious. Well developed, well nourished and in no acute distress.  HEENT: Normal. Neck: Supple, no JVD, carotid bruits, or masses noted.  Cardiac: Regular  rate and rhythm. No murmurs, rubs, or gallops. No edema.  Respiratory:  Lungs are clear to auscultation bilaterally with normal work of breathing.  GI: Soft and nontender.  MS: No deformity or atrophy. Gait and ROM intact. Skin: Warm and dry. Color is normal.  Neuro:  Strength and sensation are intact and no gross focal deficits noted.  Psych: Alert, appropriate and with normal affect.   LABORATORY DATA:  EKG:  EKG is not ordered today.   Lab Results  Component Value Date   WBC 6.3 06/10/2014   HGB 14.0 06/10/2014   HCT 39.9 06/10/2014   PLT 177 06/10/2014   GLUCOSE 87 06/10/2014   CHOL 160 06/11/2014   TRIG 45 06/11/2014   HDL 77 06/11/2014   LDLCALC 74 06/11/2014   ALT 21 04/02/2014   AST 32 04/02/2014   NA 133* 06/10/2014   K 3.6 06/10/2014   CL 95* 06/10/2014   CREATININE 1.08 06/10/2014   BUN 18 06/10/2014   CO2 28 06/10/2014   TSH 1.534 06/11/2014   INR 1.0 09/22/2008   HGBA1C 5.7* 06/11/2014    BNP (last 3 results)  Recent Labs  06/10/14 1723  BNP 132.7*    ProBNP (last 3 results) No results for input(s): PROBNP in the last 8760 hours.   Other Studies Reviewed Today: Cardiac Event Monitor Interpretation Summary from 06/2014    Normal sinus rhythm with average heart rate 65 bpm  Paroxysmal atrial fibrillation with RVR up to 136 bpm.  Frequent PAC's and atrial couplets and atrial runs  Occasional PVC's   Myoview Study Impression from 05/2014 Myocardial perfusion is normal. The study is normal. This is a low risk study. Overall left ventricular systolic function was normal. LV cavity size is normal. The left ventricular ejection fraction is normal (55-65%). There is no prior study for  comparison.  Assessment/Plan: 1. Palpitations - with documented PAF - now on Eliquis. In NSR by exam today. Will check echo and arrange for discussion with Dr. Radford Pax. Needs surveillance labs today.   2. PAF - in NSR by exam today. He is doing well clinically. Restricting his caffeine.   3. CAD - recent negative Myoview  4. HTN - BP fair  5. HLD - on statin therapy.   6. ?Pleurisy - seeing PCP later today.  Current medicines are reviewed with the patient today.  The patient does not have concerns regarding medicines other than what has been noted above.  The following changes have been made:  See above.  Labs/ tests ordered today include:    Orders Placed This Encounter  Procedures  . Basic metabolic panel  . CBC     Disposition:   FU with Dr. Radford Pax in 4 months.   Patient is agreeable to this plan and will call if any problems develop in the interim.   Signed: Burtis Junes, RN, ANP-C 07/29/2014 10:35 AM  [facility] [address] [place],   [address] Phone: [phone] Fax: [phone]

## 2014-07-29 NOTE — Patient Instructions (Addendum)
We will be checking the following labs today - BMET and CBC   Medication Instructions:    Continue with your current medicines.   Limit NSAID use - Tylenol is safer alternative   Testing/Procedures To Be Arranged:  Echocardiogram  Follow-Up:   See Dr. Radford Pax at her next available for further discussions.     Other Special Instructions:   N/A  Call the [facility] office at [phone] if you have any questions, problems or concerns.

## 2014-07-30 ENCOUNTER — Ambulatory Visit (HOSPITAL_COMMUNITY): Payer: Medicare Other | Attending: Cardiology

## 2014-07-30 ENCOUNTER — Other Ambulatory Visit: Payer: Self-pay | Admitting: *Deleted

## 2014-07-30 ENCOUNTER — Telehealth: Payer: Self-pay | Admitting: Nurse Practitioner

## 2014-07-30 ENCOUNTER — Other Ambulatory Visit: Payer: Self-pay

## 2014-07-30 DIAGNOSIS — Z7901 Long term (current) use of anticoagulants: Secondary | ICD-10-CM | POA: Diagnosis not present

## 2014-07-30 DIAGNOSIS — I480 Paroxysmal atrial fibrillation: Secondary | ICD-10-CM

## 2014-07-30 DIAGNOSIS — I358 Other nonrheumatic aortic valve disorders: Secondary | ICD-10-CM | POA: Insufficient documentation

## 2014-07-30 DIAGNOSIS — R799 Abnormal finding of blood chemistry, unspecified: Secondary | ICD-10-CM

## 2014-07-30 DIAGNOSIS — I517 Cardiomegaly: Secondary | ICD-10-CM | POA: Insufficient documentation

## 2014-07-30 DIAGNOSIS — I2510 Atherosclerotic heart disease of native coronary artery without angina pectoris: Secondary | ICD-10-CM

## 2014-07-30 DIAGNOSIS — I340 Nonrheumatic mitral (valve) insufficiency: Secondary | ICD-10-CM | POA: Insufficient documentation

## 2014-07-30 DIAGNOSIS — I10 Essential (primary) hypertension: Secondary | ICD-10-CM | POA: Diagnosis not present

## 2014-07-30 DIAGNOSIS — E785 Hyperlipidemia, unspecified: Secondary | ICD-10-CM

## 2014-07-30 NOTE — Telephone Encounter (Signed)
Walk in patient form,please call patient has questions

## 2014-07-31 ENCOUNTER — Telehealth: Payer: Self-pay

## 2014-07-31 NOTE — Telephone Encounter (Signed)
Informed patient of results and verbal understanding expressed.  Patient verified dose of Losartan - at [place] Tuesday he stated he was taking 25 mg. He was incorrect - he takes Losartan 100 mg daily.  Med list updated.

## 2014-07-31 NOTE — Telephone Encounter (Signed)
-----   Message from Burtis Junes, NP sent at 07/31/2014  8:22 AM EDT ----- Ok to report. His echo is stable - normal pumping function. Does have some enlargement of the left atrium which can predispose him to having AF.   He is appropriately anticoagulated.  No further changes to his plan of care is recommended

## 2014-08-01 ENCOUNTER — Other Ambulatory Visit: Payer: Self-pay | Admitting: *Deleted

## 2014-08-13 ENCOUNTER — Other Ambulatory Visit (INDEPENDENT_AMBULATORY_CARE_PROVIDER_SITE_OTHER): Payer: Medicare Other | Admitting: *Deleted

## 2014-08-13 DIAGNOSIS — R799 Abnormal finding of blood chemistry, unspecified: Secondary | ICD-10-CM

## 2014-08-13 NOTE — Addendum Note (Signed)
Addended by: Eulis Foster on: 08/13/2014 09:33 AM   Modules accepted: Orders

## 2014-08-14 LAB — BASIC METABOLIC PANEL
BUN: 25 mg/dL (ref 7–25)
CO2: 27 mEq/L (ref 20–31)
Calcium: 9.5 mg/dL (ref 8.6–10.3)
Chloride: 98 mEq/L (ref 98–110)
Creat: 0.89 mg/dL (ref 0.70–1.18)
Glucose, Bld: 96 mg/dL (ref 65–99)
Potassium: 4 mEq/L (ref 3.5–5.3)
Sodium: 134 mEq/L — ABNORMAL LOW (ref 135–146)

## 2014-08-15 ENCOUNTER — Telehealth: Payer: Self-pay

## 2014-08-15 NOTE — Telephone Encounter (Signed)
Called patient about recent labs and he had questions about testing that Dr. Radford Pax was going to have him do. Patient is awaiting someone to get in contact with him. Below is the note that refers to patient's concern. Will send message to [facility] Dr. Jackalyn Lombard scheduler.   Notes Recorded by Sueanne Margarita, MD on 08/11/2014 at 2:13 PM Please let patient know that heart monitor showed NSR with SVT that appears to be be sinus tachcyardia with long PR intervals as well as atrial tachcyardia with variable AV block. Unclear whether patient is actually having some runs of PAF> He was placed on Eliquis by Dr. Tamala Julian but uncertain he is having atrial fibrillation. Please refer to Dr. Rayann Heman for evaluation. If he is only having atrial tach then does not need to be anticoagulated so we need EP evaluation to determine this.

## 2014-09-05 ENCOUNTER — Ambulatory Visit (INDEPENDENT_AMBULATORY_CARE_PROVIDER_SITE_OTHER): Payer: Medicare Other | Admitting: Ophthalmology

## 2014-09-05 DIAGNOSIS — H43813 Vitreous degeneration, bilateral: Secondary | ICD-10-CM | POA: Diagnosis not present

## 2014-09-05 DIAGNOSIS — H35033 Hypertensive retinopathy, bilateral: Secondary | ICD-10-CM

## 2014-09-05 DIAGNOSIS — I10 Essential (primary) hypertension: Secondary | ICD-10-CM | POA: Diagnosis not present

## 2014-09-05 DIAGNOSIS — H33302 Unspecified retinal break, left eye: Secondary | ICD-10-CM

## 2014-09-05 DIAGNOSIS — H338 Other retinal detachments: Secondary | ICD-10-CM | POA: Diagnosis not present

## 2014-09-05 DIAGNOSIS — H2513 Age-related nuclear cataract, bilateral: Secondary | ICD-10-CM

## 2014-09-10 ENCOUNTER — Encounter: Payer: Self-pay | Admitting: Internal Medicine

## 2014-09-10 ENCOUNTER — Ambulatory Visit (INDEPENDENT_AMBULATORY_CARE_PROVIDER_SITE_OTHER): Payer: Medicare Other | Admitting: Internal Medicine

## 2014-09-10 VITALS — BP 120/64 | HR 61 | Ht 74.0 in | Wt 190.0 lb

## 2014-09-10 DIAGNOSIS — I480 Paroxysmal atrial fibrillation: Secondary | ICD-10-CM | POA: Diagnosis not present

## 2014-09-10 MED ORDER — DILTIAZEM HCL 30 MG PO TABS: 30.0000 mg | ORAL_TABLET | Freq: Four times a day (QID) | ORAL | Status: DC

## 2014-09-10 NOTE — Patient Instructions (Signed)
Medication Instructions:  Your physician has recommended you make the following change in your medication:  1)  Take Cardizem 30 mg by mouth every 6 hours as needed for fast heart rates   Labwork: None ordered  Testing/Procedures: None ordered  Follow-Up: Your physician recommends that you schedule a follow-up appointment as needed   Any Other Special Instructions Will Be Listed Below (If Applicable).

## 2014-09-10 NOTE — Progress Notes (Signed)
Electrophysiology Office Note   Date:  09/10/2014   ID:  [name], [name] [birth year], MRN [id number]  PCP:  Henrine Screws, MD  Cardiologist:  Dr Radford Pax Primary Electrophysiologist: Thompson Grayer, MD    Chief Complaint  Patient presents with  . SVT  . ATACH     History of Present Illness: [name] is a 74 y.o. male who presents today for electrophysiology evaluation.   He reports being in good health until 5/16 when he developed tachypalpitations while doing yard work.  He went to the [facility] ED but had converted to sinus rhythm by the time he presented.  Stress test was unrevealing.  He had event monitor placed which revealed afib (corresponded to symptoms) 06/30/14.  He was placed on eliquis.  He has since quit caffeine and has had no further afib.  He remains active and travels and is very active.  Today, he denies symptoms of palpitations, chest pain, shortness of breath, orthopnea, PND, lower extremity edema, claudication, dizziness, presyncope, syncope, bleeding, or neurologic sequela. The patient is tolerating medications without difficulties and is otherwise without complaint today.    Past Medical History  Diagnosis Date  . Hypertension   . Nocturia   . Hyperlipidemia   . Arthritis     HANDS AND FEET  . Cataract immature BILATERAL   . Hydrocele, left   . History of AAA (abdominal aortic aneurysm) repair 2000    W/ AORTOVIFEMORAL BYPASS  . Peripheral vascular disease FOLLOWED BY DR EARLY-- VISIT NOTE 01-05-10 AND CAROTID / VASCULAR DOPPLER  RESULTS W/ CHART    ACTIVE WALKING PROGRAM  . Status post primary angioplasty with coronary stent 2002--  X2 BM STENTS  . Popliteal artery aneurysm LEFT  . Retinal detachment   . Increased intraocular pressure   . History of inguinal herniorrhaphy   . History of diverticulosis     Noted on Colonoscopy  . Coronary artery disease CARDIOLOGIST- DR Tressia Miners TURNER-- LAST VISIT NOV  2012-- WILL REQUEST NOTE AND  STRESS TEST    s/p PCI 2000  . Asymptomatic carotid artery stenosis BILATERAL     PER DOPPLER  01-05-10   RICA  1 - 47%/   LICA  40 - 42% - followed by Dr. Donnetta Hutching  . Diverticulosis   . H/O heart artery stent 02/19/00  . Paroxysmal atrial fibrillation 06/30/14    chad2vasc score is at least 4   Past Surgical History  Procedure Laterality Date  . Coronary angioplasty  2002    PTCA  W/ X2 BM STENTS--   PROXIMA LAD  &  LEFT CIRCUMFLEX TO FIRST OBTUSE MARGINAL BRANCH  . Aaa repair w/ aortobifemoral bypass  OCT  59563  . Pulley release -- right 5th finger  2009  . Exploratory laparotomy w/ enterolysis for partial sbo  09-23-2008  . Retinal detachment surgery  2004    BILATERAL  . Abdominal hernia repair  X4  (LAST ONE 1990'S)    inguinal herniorrhaphy  . Hydrocele excision  01/24/2011    Procedure: HYDROCELECTOMY ADULT;  Surgeon: Bernestine Amass, MD;  Location: [facility];  Service: Urology;  Laterality: Left;  . Tooth extraction  Dec 2013  . Carpal tunnel release Right Oct. 2013    Hand  . Ingrown toe nail Right Jan. 2015    Right Great Toe nail  . Intest blockage  09/22/08  . Heart stents  02/19/00     Current Outpatient Prescriptions  Medication Sig Dispense  Refill  . apixaban (ELIQUIS) 5 MG TABS tablet Take 1 tablet (5 mg total) by mouth 2 (two) times daily. 60 tablet 6  . aspirin EC 81 MG tablet Take 81 mg by mouth daily.    [name] b complex vitamins tablet Take 1 tablet by mouth daily.      . calcium carbonate (OS-CAL) 600 MG TABS Take 600 mg by mouth daily.      . Coenzyme Q10 (CO Q 10) 100 MG CAPS Take 1 capsule by mouth daily.    [name] EPINEPHrine (EPIPEN) 0.3 mg/0.3 mL SOAJ injection Inject into the muscle as directed.    . hydrochlorothiazide (HYDRODIURIL) 25 MG tablet Take 25 mg by mouth daily.      [name] losartan (COZAAR) 100 MG tablet Take 100 mg by mouth daily.    . Magnesium Hydroxide (MAGNESIA PO) Take 1,000 mg by mouth daily as needed (for digestion).     . Multiple  Vitamin (MULTIVITAMIN) tablet Take 1 tablet by mouth daily.      . Omega-3 Fatty Acids (FISH OIL PO) Take 1,200 mg by mouth daily.    . rosuvastatin (CRESTOR) 20 MG tablet Take 20 mg by mouth every evening.     . Tamsulosin HCl (FLOMAX) 0.4 MG CAPS Take 0.4 mg by mouth daily.      [name] VIT C-CHOLECALCIFEROL-ROSE HIP PO Take 2,000 mg by mouth daily.     No current facility-administered medications for this visit.    Allergies:   Wasp venom   Social History:  The patient  reports that he quit smoking about 36 years ago. His smoking use included Cigarettes. He quit after 30 years of use. He has never used smokeless tobacco. He reports that he drinks about 12.6 oz of alcohol per week. He reports that he does not use illicit drugs.   Family History:  The patient's  family history includes Cancer (age of onset: 32) in his mother; Heart disease in his mother; Heart failure in his father; Hyperlipidemia in his mother.    ROS:  Please see the history of present illness.   All other systems are reviewed and negative.    PHYSICAL EXAM: VS:  BP 120/64 mmHg  Pulse 61  Ht 6\' 2"  (1.88 m)  Wt 86.183 kg (190 lb)  BMI 24.38 kg/m2 , BMI Body mass index is 24.38 kg/(m^2). GEN: Well nourished, well developed, in no acute distress HEENT: normal Neck: no JVD, carotid bruits, or masses Cardiac: RRR; no murmurs, rubs, or gallops,no edema  Respiratory:  clear to auscultation bilaterally, normal work of breathing GI: soft, nontender, nondistended, + BS MS: no deformity or atrophy Skin: warm and dry  Neuro:  Strength and sensation are intact Psych: euthymic mood, full affect  EKG:  EKG is ordered today. The ekg ordered today shows sinus rhythm 61 bpm, otherwise normal ekg   Recent Labs: 04/02/2014: ALT 21 06/10/2014: B Natriuretic Peptide 132.7* 06/11/2014: TSH 1.534 07/29/2014: Hemoglobin 13.9; Platelets 186.0 08/13/2014: BUN 25; Creat 0.89; Potassium 4.0; Sodium 134*    Lipid Panel     Component  Value Date/Time   CHOL 160 06/11/2014 0231   CHOL 164 05/24/2013 0916   TRIG 45 06/11/2014 0231   TRIG 50 05/24/2013 0916   HDL 77 06/11/2014 0231   HDL 84 05/24/2013 0916   CHOLHDL 2.1 06/11/2014 0231   VLDL 9 06/11/2014 0231   LDLCALC 74 06/11/2014 0231   LDLCALC 70 05/24/2013 0916     Wt Readings from Last 3  Encounters:  09/10/14 86.183 kg (190 lb)  07/29/14 86.238 kg (190 lb 1.9 oz)  06/11/14 85.5 kg (188 lb 7.9 oz)      Other studies Reviewed: Additional studies/ records that were reviewed today include: Dr Landis Gandy notes  Review of the above records today demonstrates: event monitor reveals afib   ASSESSMENT AND PLAN:  1.  Afib The patient has rare episodes of paroxysmal atrial fibrillation.  Therapeutic strategies for afib including medicine and ablation were discussed in detail with the patient today.  He does not wish to take daily medicines.  I think that ablation would be premature. He would prefer prn cardizem.   Follow-up with Dr Radford Pax as scheduled Should he have progression of his afib, I would like to see him back at that time do discuss other options.  Current medicines are reviewed at length with the patient today.   The patient does not have concerns regarding his medicines.  The following changes were made today:  none   Signed, Thompson Grayer, MD  09/10/2014 1:05 PM     [address] [address]  [place] [phone] (office) [phone] (fax)

## 2014-09-12 DIAGNOSIS — I4819 Other persistent atrial fibrillation: Secondary | ICD-10-CM | POA: Insufficient documentation

## 2014-09-26 ENCOUNTER — Ambulatory Visit (INDEPENDENT_AMBULATORY_CARE_PROVIDER_SITE_OTHER): Payer: Medicare Other | Admitting: Cardiology

## 2014-09-26 ENCOUNTER — Encounter: Payer: Self-pay | Admitting: Cardiology

## 2014-09-26 VITALS — BP 110/52 | HR 58 | Ht 74.0 in | Wt 191.6 lb

## 2014-09-26 DIAGNOSIS — E7800 Pure hypercholesterolemia, unspecified: Secondary | ICD-10-CM

## 2014-09-26 DIAGNOSIS — I119 Hypertensive heart disease without heart failure: Secondary | ICD-10-CM | POA: Diagnosis not present

## 2014-09-26 DIAGNOSIS — I2510 Atherosclerotic heart disease of native coronary artery without angina pectoris: Secondary | ICD-10-CM

## 2014-09-26 DIAGNOSIS — I480 Paroxysmal atrial fibrillation: Secondary | ICD-10-CM | POA: Diagnosis not present

## 2014-09-26 NOTE — Progress Notes (Signed)
Cardiology Office Note   Date:  09/26/2014   ID:  [name], DOB 16-Feb-1940, MRN [id number]  PCP:  Henrine Screws, MD    Chief Complaint  Patient presents with  . Benign hypertensive heart disease without heart failure      History of Present Illness: [name] is a 59. male with a history of CAD/HTN/dyslipidemia and PAF on chronic anticoagulation with Eliquis who presents today for followup. He is doing well. He denies any chest pain, SOB, DOE, LE edema, dizziness or syncope. He had an episode of PAF in May and June and eliminated all caffeine and has not had any episodes since then.      Past Medical History  Diagnosis Date  . Hypertension   . Nocturia   . Hyperlipidemia   . Arthritis     HANDS AND FEET  . Cataract immature BILATERAL   . Hydrocele, left   . History of AAA (abdominal aortic aneurysm) repair 2000    W/ AORTOVIFEMORAL BYPASS  . Peripheral vascular disease FOLLOWED BY DR EARLY-- VISIT NOTE 01-05-10 AND CAROTID / VASCULAR DOPPLER  RESULTS W/ CHART    ACTIVE WALKING PROGRAM  . Status post primary angioplasty with coronary stent 2002--  X2 BM STENTS  . Popliteal artery aneurysm LEFT  . Retinal detachment   . Increased intraocular pressure   . History of inguinal herniorrhaphy   . History of diverticulosis     Noted on Colonoscopy  . Coronary artery disease CARDIOLOGIST- DR Tressia Miners Myeesha Shane-- LAST VISIT NOV  2012-- WILL REQUEST NOTE AND STRESS TEST    s/p PCI 2000  . Asymptomatic carotid artery stenosis BILATERAL     PER DOPPLER  01-05-10   RICA  1 - 23%/   LICA  40 - 53% - followed by Dr. Donnetta Hutching  . Diverticulosis   . H/O heart artery stent 02/19/00  . Paroxysmal atrial fibrillation 06/30/14    chad2vasc score is at least 4    Past Surgical History  Procedure Laterality Date  . Coronary angioplasty  2002    PTCA  W/ X2 BM STENTS--   PROXIMA LAD  &  LEFT CIRCUMFLEX TO FIRST OBTUSE MARGINAL BRANCH  . Aaa repair w/  aortobifemoral bypass  OCT  61443  . Pulley release -- right 5th finger  2009  . Exploratory laparotomy w/ enterolysis for partial sbo  09-23-2008  . Retinal detachment surgery  2004    BILATERAL  . Abdominal hernia repair  X4  (LAST ONE 1990'S)    inguinal herniorrhaphy  . Hydrocele excision  01/24/2011    Procedure: HYDROCELECTOMY ADULT;  Surgeon: Bernestine Amass, MD;  Location: [facility];  Service: Urology;  Laterality: Left;  . Tooth extraction  Dec 2013  . Carpal tunnel release Right Oct. 2013    Hand  . Ingrown toe nail Right Jan. 2015    Right Great Toe nail  . Intest blockage  09/22/08  . Heart stents  02/19/00     Current Outpatient Prescriptions  Medication Sig Dispense Refill  . apixaban (ELIQUIS) 5 MG TABS tablet Take 1 tablet (5 mg total) by mouth 2 (two) times daily. 60 tablet 6  . aspirin EC 81 MG tablet Take 81 mg by mouth daily.    [name] b complex vitamins tablet Take 1 tablet by mouth daily.      . calcium carbonate (OS-CAL) 600  MG TABS Take 600 mg by mouth daily.      . Cholecalciferol 1000 UNITS capsule Take 1,000 Units by mouth daily.    . Coenzyme Q10 (CO Q 10) 100 MG CAPS Take 1 capsule by mouth daily.    [name] diltiazem (CARDIZEM LA) 300 MG 24 hr tablet Take 300 mg by mouth 4 (four) times daily as needed.    [name] EPINEPHrine (EPIPEN) 0.3 mg/0.3 mL SOAJ injection Inject into the muscle as directed.    . hydrochlorothiazide (HYDRODIURIL) 25 MG tablet Take 25 mg by mouth daily.      [name] losartan (COZAAR) 100 MG tablet Take 100 mg by mouth daily.    . Magnesium Hydroxide (MAGNESIA PO) Take 1,000 mg by mouth daily as needed (for digestion).     . Multiple Vitamin (MULTIVITAMIN) tablet Take 1 tablet by mouth daily.      . Omega-3 Fatty Acids (FISH OIL PO) Take 1,200 mg by mouth daily.    . rosuvastatin (CRESTOR) 20 MG tablet Take 20 mg by mouth every evening.     . Tamsulosin HCl (FLOMAX) 0.4 MG CAPS Take 0.4 mg by mouth daily.      [name] VIT C-CHOLECALCIFEROL-ROSE  HIP PO Take 2,000 mg by mouth daily.     No current facility-administered medications for this visit.    Allergies:   Wasp venom    Social History:  The patient  reports that he quit smoking about 36 years ago. His smoking use included Cigarettes. He quit after 30 years of use. He has never used smokeless tobacco. He reports that he drinks about 12.6 oz of alcohol per week. He reports that he does not use illicit drugs.   Family History:  The patient's family history includes Cancer (age of onset: 77) in his mother; Heart disease in his mother; Heart failure in his father; Hyperlipidemia in his mother.    ROS:  Please see the history of present illness.   Otherwise, review of systems are positive for none.   All other systems are reviewed and negative.    PHYSICAL EXAM: VS:  BP 110/52 mmHg  Pulse 58  Ht 6\' 2"  (1.88 m)  Wt 191 lb 9.6 oz (86.909 kg)  BMI 24.59 kg/m2  SpO2 99% , BMI Body mass index is 24.59 kg/(m^2). GEN: Well nourished, well developed, in no acute distress HEENT: normal Neck: no JVD or masses.  Left carotid bruit Cardiac: RRR; no murmurs, rubs, or gallops,no edema  Respiratory:  clear to auscultation bilaterally, normal work of breathing GI: soft, nontender, nondistended, + BS MS: no deformity or atrophy Skin: warm and dry, no rash Neuro:  Strength and sensation are intact Psych: euthymic mood, full affect   EKG:  EKG was not ordered today.    Recent Labs: 04/02/2014: ALT 21 06/10/2014: B Natriuretic Peptide 132.7* 06/11/2014: TSH 1.534 07/29/2014: Hemoglobin 13.9; Platelets 186.0 08/13/2014: BUN 25; Creat 0.89; Potassium 4.0; Sodium 134*    Lipid Panel    Component Value Date/Time   CHOL 160 06/11/2014 0231   CHOL 164 05/24/2013 0916   TRIG 45 06/11/2014 0231   TRIG 50 05/24/2013 0916   HDL 77 06/11/2014 0231   HDL 84 05/24/2013 0916   CHOLHDL 2.1 06/11/2014 0231   VLDL 9 06/11/2014 0231   LDLCALC 74 06/11/2014 0231   LDLCALC 70 05/24/2013 0916       Wt Readings from Last 3 Encounters:  09/26/14 191 lb 9.6 oz (86.909 kg)  09/10/14 190 lb (86.183 kg)  07/29/14 190 lb 1.9 oz (86.238 kg)    ASSESSMENT AND PLAN:  1. CAD stable with no angina - I have told him it is ok to stop the ASA since he is on Eliquis and stent was in 2002 2. HTN - controlled - continue HCTZ/ARB/Cardizem 3. Dyslipidemia - continue crestor  - check FLP/ALT   4.      PVD followed by Dr. Donnetta Hutching   5.       PAF maintaining NSR on CCB.  Continue Eliqius   Current medicines are reviewed at length with the patient today.  The patient does not have concerns regarding medicines.  The following changes have been made:  no change  Labs/ tests ordered today: See above Assessment and Plan No orders of the defined types were placed in this encounter.     Disposition:   FU with me in 1 year  Signed, Sueanne Margarita, MD  09/26/2014 9:23 AM    [address], [place], [place]  [address] Phone: [phone]; Fax: [phone]

## 2014-09-26 NOTE — Patient Instructions (Signed)
Medication Instructions: - no changes  Labwork: - Your physician recommends that you return for a FASTING lipid/ liver profile- when it is good for you  Procedures/Testing: - none  Follow-Up: - Your physician wants you to follow-up in: 1 year with Dr. Radford Pax. You will receive a reminder letter in the mail two months in advance. If you don't receive a letter, please call our office to schedule the follow-up appointment.  Any Additional Special Instructions Will Be Listed Below (If Applicable). - none

## 2014-09-30 ENCOUNTER — Other Ambulatory Visit (INDEPENDENT_AMBULATORY_CARE_PROVIDER_SITE_OTHER): Payer: Medicare Other | Admitting: *Deleted

## 2014-09-30 DIAGNOSIS — I2510 Atherosclerotic heart disease of native coronary artery without angina pectoris: Secondary | ICD-10-CM

## 2014-09-30 DIAGNOSIS — E7800 Pure hypercholesterolemia, unspecified: Secondary | ICD-10-CM

## 2014-09-30 LAB — LIPID PANEL
CHOL/HDL RATIO: 2
Cholesterol: 167 mg/dL (ref 0–200)
HDL: 74 mg/dL (ref >39.00)
LDL Cholesterol: 83 mg/dL (ref 0–99)
NONHDL: 92.74
Triglycerides: 49 mg/dL (ref 0.0–149.0)
VLDL: 9.8 mg/dL (ref 0.0–40.0)

## 2014-09-30 LAB — HEPATIC FUNCTION PANEL
ALBUMIN: 3.9 g/dL (ref 3.5–5.2)
ALK PHOS: 35 U/L — AB (ref 39–117)
ALT: 19 U/L (ref 0–53)
AST: 24 U/L (ref 0–37)
BILIRUBIN DIRECT: 0.1 mg/dL (ref 0.0–0.3)
Total Bilirubin: 0.5 mg/dL (ref 0.2–1.2)
Total Protein: 6.8 g/dL (ref 6.0–8.3)

## 2014-10-01 ENCOUNTER — Telehealth: Payer: Self-pay

## 2014-10-01 DIAGNOSIS — E7800 Pure hypercholesterolemia, unspecified: Secondary | ICD-10-CM

## 2014-10-01 MED ORDER — ROSUVASTATIN CALCIUM 40 MG PO TABS: 40.0000 mg | ORAL_TABLET | Freq: Every evening | ORAL | Status: DC

## 2014-10-01 NOTE — Telephone Encounter (Signed)
-----   Message from Sueanne Margarita, MD sent at 09/30/2014 10:15 PM EDT ----- LDL not at goal - increase crestor to 40mg  daily and recheck FLP and ALT in 6 weeks

## 2014-10-01 NOTE — Telephone Encounter (Signed)
Informed patient of results and verbal understanding expressed.  Instructed patient to INCREASE CRESTOR to 40 mg daily. FLP and ALT scheduled November 18. Patient agrees with treatment plan.

## 2014-12-05 ENCOUNTER — Other Ambulatory Visit (INDEPENDENT_AMBULATORY_CARE_PROVIDER_SITE_OTHER): Payer: Medicare Other | Admitting: *Deleted

## 2014-12-05 DIAGNOSIS — E7800 Pure hypercholesterolemia, unspecified: Secondary | ICD-10-CM

## 2014-12-05 LAB — LIPID PANEL
Cholesterol: 166 mg/dL (ref 125–200)
HDL: 83 mg/dL (ref >=40)
LDL Cholesterol: 76 mg/dL (ref <130)
Total CHOL/HDL Ratio: 2 Ratio (ref <=5.0)
Triglycerides: 35 mg/dL (ref <150)
VLDL: 7 mg/dL (ref <30)

## 2014-12-05 LAB — ALT: ALT: 24 U/L (ref 9–46)

## 2014-12-05 NOTE — Addendum Note (Signed)
Addended by: Eulis Foster on: 12/05/2014 08:53 AM   Modules accepted: Orders

## 2014-12-05 NOTE — Addendum Note (Signed)
Addended by: Eulis Foster on: 12/05/2014 08:38 AM   Modules accepted: Orders

## 2014-12-09 ENCOUNTER — Telehealth: Payer: Self-pay | Admitting: *Deleted

## 2014-12-09 DIAGNOSIS — E7800 Pure hypercholesterolemia, unspecified: Secondary | ICD-10-CM

## 2014-12-09 MED ORDER — EZETIMIBE 10 MG PO TABS: 10.0000 mg | ORAL_TABLET | Freq: Every day | ORAL | Status: DC

## 2014-12-09 NOTE — Telephone Encounter (Signed)
lmom for patient to return call in regards to his lab work

## 2014-12-09 NOTE — Telephone Encounter (Signed)
Notes Recorded by Sueanne Margarita, MD on 12/08/2014 at 10:35 PM Add zetia 10mg  daily and recheck FLP and ALT in 8 weeks

## 2014-12-15 MED ORDER — ROSUVASTATIN CALCIUM 40 MG PO TABS: 40.0000 mg | ORAL_TABLET | Freq: Every evening | ORAL | Status: DC

## 2014-12-15 NOTE — Telephone Encounter (Signed)
F/u  Pt returning RN phone call concerning lab work. Please call back and discuss.

## 2014-12-15 NOTE — Telephone Encounter (Signed)
Patient admits he was taking Crestor one and a half tablets (30 mg total) to make the medication last longer. Instructed patient to INCREASE CRESTOR to 40 mg daily. FLP and ALT scheduled for January 13. Patient agrees with treatment plan.

## 2014-12-16 MED ORDER — ROSUVASTATIN CALCIUM 40 MG PO TABS: 40.0000 mg | ORAL_TABLET | Freq: Every evening | ORAL | Status: AC

## 2015-01-30 ENCOUNTER — Other Ambulatory Visit (INDEPENDENT_AMBULATORY_CARE_PROVIDER_SITE_OTHER): Payer: PPO | Admitting: *Deleted

## 2015-01-30 DIAGNOSIS — E7800 Pure hypercholesterolemia, unspecified: Secondary | ICD-10-CM

## 2015-01-30 LAB — LIPID PANEL
CHOL/HDL RATIO: 2 ratio (ref <=5.0)
CHOLESTEROL: 146 mg/dL (ref 125–200)
HDL: 72 mg/dL (ref >=40)
LDL Cholesterol: 66 mg/dL (ref <130)
Triglycerides: 41 mg/dL (ref <150)
VLDL: 8 mg/dL (ref <30)

## 2015-01-30 LAB — ALT: ALT: 20 U/L (ref 9–46)

## 2015-01-30 NOTE — Addendum Note (Signed)
Addended by: Eulis Foster on: 01/30/2015 07:51 AM   Modules accepted: Orders

## 2015-02-21 ENCOUNTER — Encounter (HOSPITAL_COMMUNITY): Payer: Self-pay | Admitting: Radiology

## 2015-02-21 ENCOUNTER — Inpatient Hospital Stay (HOSPITAL_COMMUNITY)
Admission: EM | Admit: 2015-02-21 | Discharge: 2015-02-23 | DRG: 066 | Disposition: A | Discharge status: Home / Self Care | Payer: PPO | Attending: Internal Medicine | Admitting: Internal Medicine

## 2015-02-21 ENCOUNTER — Emergency Department (HOSPITAL_COMMUNITY): Payer: PPO

## 2015-02-21 DIAGNOSIS — I2510 Atherosclerotic heart disease of native coronary artery without angina pectoris: Secondary | ICD-10-CM | POA: Diagnosis present

## 2015-02-21 DIAGNOSIS — I119 Hypertensive heart disease without heart failure: Secondary | ICD-10-CM | POA: Diagnosis not present

## 2015-02-21 DIAGNOSIS — G459 Transient cerebral ischemic attack, unspecified: Secondary | ICD-10-CM | POA: Diagnosis not present

## 2015-02-21 DIAGNOSIS — I6789 Other cerebrovascular disease: Secondary | ICD-10-CM | POA: Diagnosis not present

## 2015-02-21 DIAGNOSIS — I6523 Occlusion and stenosis of bilateral carotid arteries: Secondary | ICD-10-CM | POA: Diagnosis present

## 2015-02-21 DIAGNOSIS — Z7982 Long term (current) use of aspirin: Secondary | ICD-10-CM | POA: Diagnosis not present

## 2015-02-21 DIAGNOSIS — I480 Paroxysmal atrial fibrillation: Secondary | ICD-10-CM | POA: Diagnosis present

## 2015-02-21 DIAGNOSIS — I639 Cerebral infarction, unspecified: Secondary | ICD-10-CM | POA: Diagnosis not present

## 2015-02-21 DIAGNOSIS — I63412 Cerebral infarction due to embolism of left middle cerebral artery: Principal | ICD-10-CM | POA: Diagnosis present

## 2015-02-21 DIAGNOSIS — Z955 Presence of coronary angioplasty implant and graft: Secondary | ICD-10-CM

## 2015-02-21 DIAGNOSIS — I63033 Cerebral infarction due to thrombosis of bilateral carotid arteries: Secondary | ICD-10-CM | POA: Diagnosis not present

## 2015-02-21 DIAGNOSIS — R4781 Slurred speech: Secondary | ICD-10-CM | POA: Diagnosis not present

## 2015-02-21 DIAGNOSIS — N289 Disorder of kidney and ureter, unspecified: Secondary | ICD-10-CM | POA: Diagnosis not present

## 2015-02-21 DIAGNOSIS — I4891 Unspecified atrial fibrillation: Secondary | ICD-10-CM

## 2015-02-21 DIAGNOSIS — I6350 Cerebral infarction due to unspecified occlusion or stenosis of unspecified cerebral artery: Secondary | ICD-10-CM | POA: Diagnosis not present

## 2015-02-21 DIAGNOSIS — Z87891 Personal history of nicotine dependence: Secondary | ICD-10-CM

## 2015-02-21 DIAGNOSIS — I4819 Other persistent atrial fibrillation: Secondary | ICD-10-CM | POA: Diagnosis present

## 2015-02-21 DIAGNOSIS — E785 Hyperlipidemia, unspecified: Secondary | ICD-10-CM | POA: Diagnosis not present

## 2015-02-21 DIAGNOSIS — D649 Anemia, unspecified: Secondary | ICD-10-CM | POA: Diagnosis not present

## 2015-02-21 DIAGNOSIS — Z79899 Other long term (current) drug therapy: Secondary | ICD-10-CM

## 2015-02-21 DIAGNOSIS — R29702 NIHSS score 2: Secondary | ICD-10-CM | POA: Diagnosis not present

## 2015-02-21 DIAGNOSIS — Z7901 Long term (current) use of anticoagulants: Secondary | ICD-10-CM | POA: Diagnosis not present

## 2015-02-21 DIAGNOSIS — I739 Peripheral vascular disease, unspecified: Secondary | ICD-10-CM | POA: Diagnosis present

## 2015-02-21 LAB — CBC
HEMATOCRIT: 37 % — AB (ref 39.0–52.0)
HEMOGLOBIN: 12.7 g/dL — AB (ref 13.0–17.0)
MCH: 34 pg (ref 26.0–34.0)
MCHC: 34.3 g/dL (ref 30.0–36.0)
MCV: 99.2 fL (ref 78.0–100.0)
Platelets: 173 10*3/uL (ref 150–400)
RBC: 3.73 MIL/uL — ABNORMAL LOW (ref 4.22–5.81)
RDW: 12.7 % (ref 11.5–15.5)
WBC: 8.6 10*3/uL (ref 4.0–10.5)

## 2015-02-21 LAB — URINALYSIS, ROUTINE W REFLEX MICROSCOPIC
BILIRUBIN URINE: NEGATIVE
GLUCOSE, UA: NEGATIVE mg/dL
HGB URINE DIPSTICK: NEGATIVE
KETONES UR: NEGATIVE mg/dL
Leukocytes, UA: NEGATIVE
Nitrite: NEGATIVE
PH: 6.5 (ref 5.0–8.0)
Protein, ur: NEGATIVE mg/dL
Specific Gravity, Urine: 1.012 (ref 1.005–1.030)

## 2015-02-21 LAB — PROTIME-INR
INR: 1.21 (ref 0.00–1.49)
Prothrombin Time: 15.5 seconds — ABNORMAL HIGH (ref 11.6–15.2)

## 2015-02-21 LAB — CBG MONITORING, ED: Glucose-Capillary: 114 mg/dL — ABNORMAL HIGH (ref 65–99)

## 2015-02-21 LAB — I-STAT CHEM 8, ED
BUN: 23 mg/dL — ABNORMAL HIGH (ref 6–20)
CALCIUM ION: 1.13 mmol/L (ref 1.13–1.30)
CREATININE: 1.3 mg/dL — AB (ref 0.61–1.24)
Chloride: 97 mmol/L — ABNORMAL LOW (ref 101–111)
GLUCOSE: 110 mg/dL — AB (ref 65–99)
HCT: 40 % (ref 39.0–52.0)
HEMOGLOBIN: 13.6 g/dL (ref 13.0–17.0)
Potassium: 3.5 mmol/L (ref 3.5–5.1)
Sodium: 139 mmol/L (ref 135–145)
TCO2: 27 mmol/L (ref 0–100)

## 2015-02-21 LAB — COMPREHENSIVE METABOLIC PANEL
ALT: 19 U/L (ref 17–63)
AST: 29 U/L (ref 15–41)
Albumin: 3.7 g/dL (ref 3.5–5.0)
Alkaline Phosphatase: 34 U/L — ABNORMAL LOW (ref 38–126)
Anion gap: 12 (ref 5–15)
BILIRUBIN TOTAL: 0.5 mg/dL (ref 0.3–1.2)
BUN: 21 mg/dL — AB (ref 6–20)
CO2: 27 mmol/L (ref 22–32)
CREATININE: 1.31 mg/dL — AB (ref 0.61–1.24)
Calcium: 9.5 mg/dL (ref 8.9–10.3)
Chloride: 99 mmol/L — ABNORMAL LOW (ref 101–111)
GFR, EST AFRICAN AMERICAN: 59 mL/min — AB (ref >60)
GFR, EST NON AFRICAN AMERICAN: 51 mL/min — AB (ref >60)
Glucose, Bld: 114 mg/dL — ABNORMAL HIGH (ref 65–99)
POTASSIUM: 3.6 mmol/L (ref 3.5–5.1)
Sodium: 138 mmol/L (ref 135–145)
TOTAL PROTEIN: 6.6 g/dL (ref 6.5–8.1)

## 2015-02-21 LAB — DIFFERENTIAL
BASOS ABS: 0 10*3/uL (ref 0.0–0.1)
Basophils Relative: 0 %
EOS ABS: 0.1 10*3/uL (ref 0.0–0.7)
Eosinophils Relative: 1 %
LYMPHS ABS: 1.6 10*3/uL (ref 0.7–4.0)
Lymphocytes Relative: 19 %
MONO ABS: 1 10*3/uL (ref 0.1–1.0)
MONOS PCT: 12 %
Neutro Abs: 5.8 10*3/uL (ref 1.7–7.7)
Neutrophils Relative %: 68 %

## 2015-02-21 LAB — I-STAT TROPONIN, ED: TROPONIN I, POC: 0 ng/mL (ref 0.00–0.08)

## 2015-02-21 LAB — APTT: APTT: 32 s (ref 24–37)

## 2015-02-21 LAB — ETHANOL: ALCOHOL ETHYL (B): 51 mg/dL — AB (ref <5)

## 2015-02-21 NOTE — Progress Notes (Signed)
Code stroke called on 75 y.o male LSN 2200. Pertinent medical history includes AAA, Afib, cardiac stents, HTN, hyperlipidemia, CAD.  Pt developed acute weakness in right arm with slurred speech while attempting to get into bed tonight. EMS called and upon arrival improvement in weakness and speech noted. Speech clear to assessment upon arrival to ED, drift assessed in right arm. Labs drawn and Patient taken to CT scan STAT. CT negative per Neurologist Dr. Nicole Kindred. NIHSS completed upon arrival to ED room, score yielding 2 for right arm drift and ataxia in right arm. CBG 114. Not a TPA candidate at this time due to mild resolving symptoms and he has been taking Eloquis for Afib. Pt for admit tonight for full stroke work up.

## 2015-02-21 NOTE — ED Notes (Addendum)
Per EMS:  Pt presents to the ED after attempting to get in bed at 2200, and was suddenly unable to use his right arm.  Pt's speech was also slurred.  Improvements noted with EMS en route.  Pt alert at this time, airway intact.  Pt is on Eliquis for a-fib.  Pt has a hx of cardiac stents and AAA

## 2015-02-21 NOTE — ED Provider Notes (Signed)
CSN: [id number]     Arrival date & time 02/21/15  2251 History  By signing my name below, I, Irene Pap, attest that this documentation has been prepared under the direction and in the presence of Delora Fuel, MD. Electronically Signed: Irene Pap, ED Scribe. 02/21/2015. 11:16 PM.  Chief Complaint  Patient presents with  . Code Stroke   The history is provided by the patient. No language interpreter was used.  HPI Comments: [name] is a 75 y.o. Male with a hx of HTN, AAA, cardiac stents, PVD, popliteal artery aneurysm, and CAD who presents to the Emergency Department complaining of weakness to the right arm onset PTA. Pt states that he was attempting to get into the bed at 10 PM and was suddenly unable to use his right arm; he states that he was unable to make a fist. He states that he has improved since being in the ED. Pt states that he had 2.5 ounces of vodka this evening before the weakness occurred. Per EMS, pt also had slurred speech upon arrival, but improved en route. Pt is on Eliquis for A-Fib. He denies fever, chills, nausea, vomiting, syncope or numbness.  Past Medical History  Diagnosis Date  . Hypertension   . Nocturia   . Hyperlipidemia   . Arthritis     HANDS AND FEET  . Cataract immature BILATERAL   . Hydrocele, left   . History of AAA (abdominal aortic aneurysm) repair 2000    W/ AORTOVIFEMORAL BYPASS  . Peripheral vascular disease ([place]) FOLLOWED BY DR EARLY-- VISIT NOTE 01-05-10 AND CAROTID / VASCULAR DOPPLER  RESULTS W/ CHART    ACTIVE WALKING PROGRAM  . Status post primary angioplasty with coronary stent 2002--  X2 BM STENTS  . 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 Retinal detachment   . Increased intraocular pressure   . History of inguinal herniorrhaphy   . History of diverticulosis     Noted on Colonoscopy  . Coronary artery disease CARDIOLOGIST- DR Tressia Miners TURNER-- LAST VISIT NOV  2012-- WILL REQUEST NOTE AND STRESS TEST    s/p PCI 2000  .  Asymptomatic carotid artery stenosis BILATERAL     PER DOPPLER  01-05-10   RICA  1 - [id number]   LICA  40 - [id number] - followed by Dr. Donnetta Hutching  . Diverticulosis   . H/O heart artery stent 02/19/00  . Paroxysmal atrial fibrillation ([place]) 06/30/14    chad2vasc score is at least 4   Past Surgical History  Procedure Laterality Date  . Coronary angioplasty  2002    PTCA  W/ X2 BM STENTS--   PROXIMA LAD  &  LEFT CIRCUMFLEX TO FIRST OBTUSE MARGINAL BRANCH  . Aaa repair w/ aortobifemoral bypass  OCT  09811  . Pulley release -- right 5th finger  2009  . Exploratory laparotomy w/ enterolysis for partial sbo  09-23-2008  . Retinal detachment surgery  2004    BILATERAL  . Abdominal hernia repair  X4  (LAST ONE 1990'S)    inguinal herniorrhaphy  . Hydrocele excision  01/24/2011    Procedure: HYDROCELECTOMY ADULT;  Surgeon: Bernestine Amass, MD;  Location: [facility];  Service: Urology;  Laterality: Left;  . Tooth extraction  Dec 2013  . Carpal tunnel release Right Oct. 2013    Hand  . Ingrown toe nail Right Jan. 2015    Right Great Toe nail  . Intest blockage  09/22/08  . Heart stents  02/19/00  Family History  Problem Relation Age of Onset  . Heart disease Mother     AAA  Before age 42  . Cancer Mother 61    Colon cancer  . Hyperlipidemia Mother   . Heart failure Father    Social History  Substance Use Topics  . Smoking status: Former Smoker -- 30 years    Types: Cigarettes    Quit date: 01/18/1978  . Smokeless tobacco: Never Used  . Alcohol Use: 12.6 oz/week    21 Standard drinks or equivalent per week     Comment: 2 oz of vodka per day    Review of Systems  Constitutional: Negative for fever and chills.  Gastrointestinal: Negative for nausea and vomiting.  Neurological: Positive for weakness. Negative for syncope and numbness.  All other systems reviewed and are negative.  Allergies  Wasp venom  Home Medications   Prior to Admission medications   Medication Sig Start  Date End Date Taking? Authorizing Provider  apixaban (ELIQUIS) 5 MG TABS tablet Take 1 tablet (5 mg total) by mouth 2 (two) times daily. 06/30/14   Sueanne Margarita, MD  aspirin EC 81 MG tablet Take 81 mg by mouth daily.    Historical Provider, MD  b complex vitamins tablet Take 1 tablet by mouth daily.      Historical Provider, MD  calcium carbonate (OS-CAL) 600 MG TABS Take 600 mg by mouth daily.      Historical Provider, MD  Cholecalciferol 1000 UNITS capsule Take 1,000 Units by mouth daily.    Historical Provider, MD  Coenzyme Q10 (CO Q 10) 100 MG CAPS Take 1 capsule by mouth daily.    Historical Provider, MD  diltiazem (CARDIZEM LA) 300 MG 24 hr tablet Take 300 mg by mouth 4 (four) times daily as needed.    Historical Provider, MD  EPINEPHrine (EPIPEN) 0.3 mg/0.3 mL SOAJ injection Inject into the muscle as directed.    Historical Provider, MD  ezetimibe (ZETIA) 10 MG tablet Take 1 tablet (10 mg total) by mouth daily. 12/09/14   Sueanne Margarita, MD  hydrochlorothiazide (HYDRODIURIL) 25 MG tablet Take 25 mg by mouth daily.      Historical Provider, MD  losartan (COZAAR) 100 MG tablet Take 100 mg by mouth daily.    Historical Provider, MD  Magnesium Hydroxide (MAGNESIA PO) Take 1,000 mg by mouth daily as needed (for digestion).     Historical Provider, MD  Multiple Vitamin (MULTIVITAMIN) tablet Take 1 tablet by mouth daily.      Historical Provider, MD  Omega-3 Fatty Acids (FISH OIL PO) Take 1,200 mg by mouth daily.    Historical Provider, MD  rosuvastatin (CRESTOR) 40 MG tablet Take 1 tablet (40 mg total) by mouth every evening. 12/16/14   Sueanne Margarita, MD  Tamsulosin HCl (FLOMAX) 0.4 MG CAPS Take 0.4 mg by mouth daily.      Historical Provider, MD  VIT C-CHOLECALCIFEROL-ROSE HIP PO Take 2,000 mg by mouth daily.    Historical Provider, MD   BP 164/68 mmHg  Pulse 92  Temp(Src) 98.1 F (36.7 C) (Oral)  Resp 16  SpO2 99% Physical Exam  Constitutional: He is oriented to person, place, and  time. He appears well-developed and well-nourished.  HENT:  Head: Normocephalic and atraumatic.  Eyes: EOM are normal. Pupils are equal, round, and reactive to light.  Fundi showed no hemorrhage or exudate  Neck: Normal range of motion. Neck supple. No JVD present. Carotid bruit is not present.  Cardiovascular: Normal rate, regular rhythm and normal heart sounds.  Exam reveals no gallop and no friction rub.   No murmur heard. Pulmonary/Chest: Effort normal and breath sounds normal. He has no wheezes. He has no rales. He exhibits no tenderness.  Abdominal: Soft. Bowel sounds are normal. He exhibits no distension and no mass. There is no tenderness.  Musculoskeletal: Normal range of motion. He exhibits no edema.  Lymphadenopathy:    He has no cervical adenopathy.  Neurological: He is alert and oriented to person, place, and time. No cranial nerve deficit. He exhibits normal muscle tone.  Strength 5/5 in both arms and legs; mild right pronator drift; mild ataxia on finger to nose on the right  Skin: Skin is warm and dry. No rash noted.  Psychiatric: He has a normal mood and affect. His behavior is normal. Judgment and thought content normal.  Nursing note and vitals reviewed.   ED Course  Procedures (including critical care time) DIAGNOSTIC STUDIES: Oxygen Saturation is 99% on RA, normal by my interpretation.    COORDINATION OF CARE: 11:10 PM-Discussed treatment plan which includes labs with pt at bedside and pt agreed to plan.    Labs Review Results for orders placed or performed during the hospital encounter of 02/21/15  Ethanol  Result Value Ref Range   Alcohol, Ethyl (B) 51 (H) <5 mg/dL  Protime-INR  Result Value Ref Range   Prothrombin Time 15.5 (H) 11.6 - 15.2 seconds   INR 1.21 0.00 - 1.49  APTT  Result Value Ref Range   aPTT 32 24 - 37 seconds  CBC  Result Value Ref Range   WBC 8.6 4.0 - 10.5 K/uL   RBC 3.73 (L) 4.22 - 5.81 MIL/uL   Hemoglobin 12.7 (L) 13.0 - 17.0  g/dL   HCT 37.0 (L) 39.0 - 52.0 %   MCV 99.2 78.0 - 100.0 fL   MCH 34.0 26.0 - 34.0 pg   MCHC 34.3 30.0 - 36.0 g/dL   RDW 12.7 11.5 - 15.5 %   Platelets 173 150 - 400 K/uL  Differential  Result Value Ref Range   Neutrophils Relative % 68 %   Neutro Abs 5.8 1.7 - 7.7 K/uL   Lymphocytes Relative 19 %   Lymphs Abs 1.6 0.7 - 4.0 K/uL   Monocytes Relative 12 %   Monocytes Absolute 1.0 0.1 - 1.0 K/uL   Eosinophils Relative 1 %   Eosinophils Absolute 0.1 0.0 - 0.7 K/uL   Basophils Relative 0 %   Basophils Absolute 0.0 0.0 - 0.1 K/uL  Comprehensive metabolic panel  Result Value Ref Range   Sodium 138 135 - 145 mmol/L   Potassium 3.6 3.5 - 5.1 mmol/L   Chloride 99 (L) 101 - 111 mmol/L   CO2 27 22 - 32 mmol/L   Glucose, Bld 114 (H) 65 - 99 mg/dL   BUN 21 (H) 6 - 20 mg/dL   Creatinine, Ser 1.31 (H) 0.61 - 1.24 mg/dL   Calcium 9.5 8.9 - 10.3 mg/dL   Total Protein 6.6 6.5 - 8.1 g/dL   Albumin 3.7 3.5 - 5.0 g/dL   AST 29 15 - 41 U/L   ALT 19 17 - 63 U/L   Alkaline Phosphatase 34 (L) 38 - 126 U/L   Total Bilirubin 0.5 0.3 - 1.2 mg/dL   GFR calc non Af Amer 51 (L) >60 mL/min   GFR calc Af Amer 59 (L) >60 mL/min   Anion gap 12 5 - 15  Urine  rapid drug screen (hosp performed)not at [facility]  Result Value Ref Range   Opiates NONE DETECTED NONE DETECTED   Cocaine NONE DETECTED NONE DETECTED   Benzodiazepines NONE DETECTED NONE DETECTED   Amphetamines NONE DETECTED NONE DETECTED   Tetrahydrocannabinol NONE DETECTED NONE DETECTED   Barbiturates NONE DETECTED NONE DETECTED  Urinalysis, Routine w reflex microscopic (not at [facility])  Result Value Ref Range   Color, Urine YELLOW YELLOW   APPearance CLEAR CLEAR   Specific Gravity, Urine 1.012 1.005 - 1.030   pH 6.5 5.0 - 8.0   Glucose, UA NEGATIVE NEGATIVE mg/dL   Hgb urine dipstick NEGATIVE NEGATIVE   Bilirubin Urine NEGATIVE NEGATIVE   Ketones, ur NEGATIVE NEGATIVE mg/dL   Protein, ur NEGATIVE NEGATIVE mg/dL   Nitrite NEGATIVE NEGATIVE    Leukocytes, UA NEGATIVE NEGATIVE  I-Stat Chem 8, ED  (not at [facility], [facility])  Result Value Ref Range   Sodium 139 135 - 145 mmol/L   Potassium 3.5 3.5 - 5.1 mmol/L   Chloride 97 (L) 101 - 111 mmol/L   BUN 23 (H) 6 - 20 mg/dL   Creatinine, Ser 1.30 (H) 0.61 - 1.24 mg/dL   Glucose, Bld 110 (H) 65 - 99 mg/dL   Calcium, Ion 1.13 1.13 - 1.30 mmol/L   TCO2 27 0 - 100 mmol/L   Hemoglobin 13.6 13.0 - 17.0 g/dL   HCT 40.0 39.0 - 52.0 %  I-stat troponin, ED (not at [facility], [facility])  Result Value Ref Range   Troponin i, poc 0.00 0.00 - 0.08 ng/mL   Comment 3          POC CBG, ED  Result Value Ref Range   Glucose-Capillary 114 (H) 65 - 99 mg/dL   Comment 1 Notify RN    Comment 2 Document in Chart     Imaging Review Ct Head Wo Contrast  02/21/2015  CLINICAL DATA:  Code stroke for right-sided weakness and resolving slurred speech. EXAM: CT HEAD WITHOUT CONTRAST TECHNIQUE: Contiguous axial images were obtained from the base of the skull through the vertex without intravenous contrast. COMPARISON:  None. FINDINGS: Skull and Sinuses:Negative for fracture or destructive process. The visualized mastoids, middle ears, and imaged paranasal sinuses are clear. Visualized orbits: Sequela of right retinal detachment surgery, including scleral band. Brain: No evidence of acute infarction, hemorrhage, hydrocephalus, or mass lesion/mass effect. ASPECTS is 10. No abnormal vessel hyperdensity. Intracranial calcified atherosclerosis. Mild small vessel ischemic change for age. Generalized cerebral volume loss. Critical Value/emergent results were called by telephone at the time of interpretation on 02/21/2015 at 11:12 pm to Dr. Nicole Kindred , who verbally acknowledged these results. IMPRESSION: No acute hemorrhage or visible infarct. Electronically Signed   By: Monte Fantasia M.D.   On: 02/21/2015 23:14   I have personally reviewed and evaluated these images and lab results as part of my medical decision-making.   EKG  Interpretation   Date/Time:  Saturday February 21 2015 23:08:18 EST Ventricular Rate:  95 PR Interval:  210 QRS Duration: 127 QT Interval:  379 QTC Calculation: 476 R Axis:   104 Text Interpretation:  Sinus rhythm Borderline prolonged PR interval Left  atrial enlargement Nonspecific intraventricular conduction delay When  compared with ECG of 06/11/2014, HEART RATE has increased Confirmed by  [facility]  MD, Abigail Teall ([id number]) on 02/21/2015 11:23:36 PM      CRITICAL CARE Performed by: [id number] Total critical care time: 35 minutes Critical care time was exclusive of separately billable procedures and treating other patients. Critical care  was necessary to treat or prevent imminent or life-threatening deterioration. Critical care was time spent personally by me on the following activities: development of treatment plan with patient and/or surrogate as well as nursing, discussions with consultants, evaluation of patient's response to treatment, examination of patient, obtaining history from patient or surrogate, ordering and performing treatments and interventions, ordering and review of laboratory studies, ordering and review of radiographic studies, pulse oximetry and re-evaluation of patient's condition.  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 Symptoms are improving. He is not a candidate for thrombolytic therapy because of anticoagulation and because of mild massive symptoms (NIH stroke scale 1). Patient seen in conjunction with Dr. Nicole Kindred of neurology service. Old records are reviewed and he does have history of atrial fibrillation for which she is anticoagulated with apixaban. He has had carotid ultrasound done last March and echocardiogram on last July with unremarkable findings. Case is discussed with Dr. Arnoldo Morale of [facility] agrees to admit the patient.  I  personally performed the services described in this documentation, which was scribed in my presence. The recorded information has been reviewed and is accurate.      Delora Fuel, MD [phone]

## 2015-02-21 NOTE — Consult Note (Signed)
Admission H&P    Chief Complaint: Acute onset of slurred speech and right upper extremity weakness.  HPI: [name] is an 75 y.o. male with atrial fibrillation on anticoagulation, hypertension, hyperlipidemia, peripheral vascular disease and coronary artery disease, brought to the emergency room and code stroke status following the acute onset of right upper extremity weakness and slurred speech. He was last known well at 10 PM tonight. He has no previous history of stroke nor TIA. In addition to anticoagulation with Eliquis, he also takes aspirin daily. CT scan of his head showed no acute intracranial abnormality. Deficits improved fairly rapidly with return of speech to being essentially normal, as well as improved strength of his right upper extremity. NIH stroke score was 2.  LSN: 10:00 PM on 02/21/2015 tPA Given: No: On Eliquis; rapidly resolving deficits mRankin:  Past Medical History  Diagnosis Date  . Hypertension   . Nocturia   . Hyperlipidemia   . Arthritis     HANDS AND FEET  . Cataract immature BILATERAL   . Hydrocele, left   . History of AAA (abdominal aortic aneurysm) repair 2000    W/ AORTOVIFEMORAL BYPASS  . Peripheral vascular disease ([place]) FOLLOWED BY DR EARLY-- VISIT NOTE 01-05-10 AND CAROTID / VASCULAR DOPPLER  RESULTS W/ CHART    ACTIVE WALKING PROGRAM  . Status post primary angioplasty with coronary stent 2002--  X2 BM STENTS  . 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 Retinal detachment   . Increased intraocular pressure   . History of inguinal herniorrhaphy   . History of diverticulosis     Noted on Colonoscopy  . Coronary artery disease CARDIOLOGIST- DR Tressia Miners TURNER-- LAST VISIT NOV  2012-- WILL REQUEST NOTE AND STRESS TEST    s/p PCI 2000  . Asymptomatic carotid artery stenosis BILATERAL     PER DOPPLER  01-05-10   RICA  1 - [id number]   LICA  40 - [id number] - followed by Dr. Donnetta Hutching  . Diverticulosis   . H/O heart artery stent 02/19/00  . Paroxysmal atrial  fibrillation ([place]) 06/30/14    chad2vasc score is at least 4    Past Surgical History  Procedure Laterality Date  . Coronary angioplasty  2002    PTCA  W/ X2 BM STENTS--   PROXIMA LAD  &  LEFT CIRCUMFLEX TO FIRST OBTUSE MARGINAL BRANCH  . Aaa repair w/ aortobifemoral bypass  OCT  09811  . Pulley release -- right 5th finger  2009  . Exploratory laparotomy w/ enterolysis for partial sbo  09-23-2008  . Retinal detachment surgery  2004    BILATERAL  . Abdominal hernia repair  X4  (LAST ONE 1990'S)    inguinal herniorrhaphy  . Hydrocele excision  01/24/2011    Procedure: HYDROCELECTOMY ADULT;  Surgeon: Bernestine Amass, MD;  Location: [facility];  Service: Urology;  Laterality: Left;  . Tooth extraction  Dec 2013  . Carpal tunnel release Right Oct. 2013    Hand  . Ingrown toe nail Right Jan. 2015    Right Great Toe nail  . Intest blockage  09/22/08  . Heart stents  02/19/00    Family History  Problem Relation Age of Onset  . Heart disease Mother     AAA  Before age 51  . Cancer Mother 71    Colon cancer  . Hyperlipidemia Mother   . Heart failure Father    Social History:  reports that he quit smoking about 37 years ago.  His smoking use included Cigarettes. He quit after 30 years of use. He has never used smokeless tobacco. He reports that he drinks about 12.6 oz of alcohol per week. He reports that he does not use illicit drugs.  Allergies:  Allergies  Allergen Reactions  . Wasp Venom Shortness Of Breath and Swelling    Medications: Patient's preadmission medications were reviewed by me.  ROS: History obtained from the patient  General ROS: negative for - chills, fatigue, fever, night sweats, weight gain or weight loss Psychological ROS: negative for - behavioral disorder, hallucinations, memory difficulties, mood swings or suicidal ideation Ophthalmic ROS: negative for - blurry vision, double vision, eye pain or loss of vision ENT ROS: negative for -  epistaxis, nasal discharge, oral lesions, sore throat, tinnitus or vertigo Allergy and Immunology ROS: negative for - hives or itchy/watery eyes Hematological and Lymphatic ROS: negative for - bleeding problems, bruising or swollen lymph nodes Endocrine ROS: negative for - galactorrhea, hair pattern changes, polydipsia/polyuria or temperature intolerance Respiratory ROS: negative for - cough, hemoptysis, shortness of breath or wheezing Cardiovascular ROS: negative for - chest pain, dyspnea on exertion, edema or irregular heartbeat Gastrointestinal ROS: negative for - abdominal pain, diarrhea, hematemesis, nausea/vomiting or stool incontinence Genito-Urinary ROS: negative for - dysuria, hematuria, incontinence or urinary frequency/urgency Musculoskeletal ROS: negative for - joint swelling or muscular weakness Neurological ROS: as noted in HPI Dermatological ROS: negative for rash and skin lesion changes  Physical Examination: Blood pressure 164/68, pulse 92, temperature 98.1 F (36.7 C), temperature source Oral, resp. rate 16, SpO2 99 %.  HEENT-  Normocephalic, no lesions, without obvious abnormality.  Normal external eye and conjunctiva.  Normal TM's bilaterally.  Normal auditory canals and external ears. Normal external nose, mucus membranes and septum.  Normal pharynx. Neck supple with no masses, nodes, nodules or enlargement. Cardiovascular - regular rate and rhythm, S1, S2 normal, no murmur, click, rub or gallop Lungs - chest clear, no wheezing, rales, normal symmetric air entry Abdomen - soft, non-tender; bowel sounds normal; no masses,  no organomegaly Extremities - no joint deformities, effusion, or inflammation and no edema  Neurologic Examination: Mental Status: Alert, oriented, thought content appropriate.  Speech fluent without evidence of aphasia. Able to follow commands without difficulty. Cranial Nerves: II-Visual fields were normal. III/IV/VI-Pupils were equal and reacted  normally to light. Extraocular movements were full and conjugate.    V/VII-no facial numbness and no facial weakness. VIII-normal. X-normal speech and symmetrical palatal movement. XI: trapezius strength/neck flexion strength normal bilaterally XII-midline tongue extension with normal strength. Motor: Mild drift of right upper extremity; motor exam is otherwise unremarkable. Sensory: Normal throughout. Deep Tendon Reflexes: 2+ and symmetric. Plantars: Flexor bilaterally Cerebellar: Moderately impaired coordination of right upper extremity with finger to nose testing. Carotid auscultation: Normal  Results for orders placed or performed during the hospital encounter of 02/21/15 (from the past 48 hour(s))  Protime-INR     Status: Abnormal   Collection Time: 02/21/15 10:57 PM  Result Value Ref Range   Prothrombin Time 15.5 (H) 11.6 - 15.2 seconds   INR 1.21 0.00 - 1.49  APTT     Status: None   Collection Time: 02/21/15 10:57 PM  Result Value Ref Range   aPTT 32 24 - 37 seconds  CBC     Status: Abnormal   Collection Time: 02/21/15 10:57 PM  Result Value Ref Range   WBC 8.6 4.0 - 10.5 K/uL   RBC 3.73 (L) 4.22 - 5.81 MIL/uL  Hemoglobin 12.7 (L) 13.0 - 17.0 g/dL   HCT 37.0 (L) 39.0 - 52.0 %   MCV 99.2 78.0 - 100.0 fL   MCH 34.0 26.0 - 34.0 pg   MCHC 34.3 30.0 - 36.0 g/dL   RDW 12.7 11.5 - 15.5 %   Platelets 173 150 - 400 K/uL  Differential     Status: None   Collection Time: 02/21/15 10:57 PM  Result Value Ref Range   Neutrophils Relative % 68 %   Neutro Abs 5.8 1.7 - 7.7 K/uL   Lymphocytes Relative 19 %   Lymphs Abs 1.6 0.7 - 4.0 K/uL   Monocytes Relative 12 %   Monocytes Absolute 1.0 0.1 - 1.0 K/uL   Eosinophils Relative 1 %   Eosinophils Absolute 0.1 0.0 - 0.7 K/uL   Basophils Relative 0 %   Basophils Absolute 0.0 0.0 - 0.1 K/uL  I-stat troponin, ED (not at [facility], [facility])     Status: None   Collection Time: 02/21/15 10:58 PM  Result Value Ref Range   Troponin i, poc 0.00  0.00 - 0.08 ng/mL   Comment 3            Comment: Due to the release kinetics of cTnI, a negative result within the first hours of the onset of symptoms does not rule out myocardial infarction with certainty. If myocardial infarction is still suspected, repeat the test at appropriate intervals.   I-Stat Chem 8, ED  (not at [facility], [facility])     Status: Abnormal   Collection Time: 02/21/15 11:00 PM  Result Value Ref Range   Sodium 139 135 - 145 mmol/L   Potassium 3.5 3.5 - 5.1 mmol/L   Chloride 97 (L) 101 - 111 mmol/L   BUN 23 (H) 6 - 20 mg/dL   Creatinine, Ser 1.30 (H) 0.61 - 1.24 mg/dL   Glucose, Bld 110 (H) 65 - 99 mg/dL   Calcium, Ion 1.13 1.13 - 1.30 mmol/L   TCO2 27 0 - 100 mmol/L   Hemoglobin 13.6 13.0 - 17.0 g/dL   HCT 40.0 39.0 - 52.0 %  POC CBG, ED     Status: Abnormal   Collection Time: 02/21/15 11:10 PM  Result Value Ref Range   Glucose-Capillary 114 (H) 65 - 99 mg/dL   Comment 1 Notify RN    Comment 2 Document in Chart    Ct Head Wo Contrast  02/21/2015  CLINICAL DATA:  Code stroke for right-sided weakness and resolving slurred speech. EXAM: CT HEAD WITHOUT CONTRAST TECHNIQUE: Contiguous axial images were obtained from the base of the skull through the vertex without intravenous contrast. COMPARISON:  None. FINDINGS: Skull and Sinuses:Negative for fracture or destructive process. The visualized mastoids, middle ears, and imaged paranasal sinuses are clear. Visualized orbits: Sequela of right retinal detachment surgery, including scleral band. Brain: No evidence of acute infarction, hemorrhage, hydrocephalus, or mass lesion/mass effect. ASPECTS is 10. No abnormal vessel hyperdensity. Intracranial calcified atherosclerosis. Mild small vessel ischemic change for age. Generalized cerebral volume loss. Critical Value/emergent results were called by telephone at the time of interpretation on 02/21/2015 at 11:12 pm to Dr. Nicole Kindred , who verbally acknowledged these results. IMPRESSION: No  acute hemorrhage or visible infarct. Electronically Signed   By: Monte Fantasia M.D.   On: 02/21/2015 23:14    Assessment: 75 y.o. male with multiple risk factors for stroke presenting with probable right MCA territory subcortical TIA. However, an acute infarction cannot be ruled out at this point.  Stroke Risk  Factors - atrial fibrillation, hyperlipidemia and hypertension  Plan: 1. HgbA1c, fasting lipid panel 2. MRI, MRA  of the brain without contrast 3. PT consult, OT consult, Speech consult 4. Echocardiogram 5. Carotid dopplers 6. Prophylactic therapy-Antiplatelet med: Aspirin 81 mg per day; anticoagulation with Eliquis 7. Risk factor modification 8. Telemetry monitoring  C.R. Nicole Kindred, MD [facility] [phone]  02/21/2015, 11:24 PM

## 2015-02-22 ENCOUNTER — Inpatient Hospital Stay (HOSPITAL_COMMUNITY): Payer: PPO

## 2015-02-22 DIAGNOSIS — I480 Paroxysmal atrial fibrillation: Secondary | ICD-10-CM

## 2015-02-22 DIAGNOSIS — I639 Cerebral infarction, unspecified: Secondary | ICD-10-CM | POA: Diagnosis present

## 2015-02-22 DIAGNOSIS — D649 Anemia, unspecified: Secondary | ICD-10-CM | POA: Diagnosis not present

## 2015-02-22 DIAGNOSIS — I6350 Cerebral infarction due to unspecified occlusion or stenosis of unspecified cerebral artery: Secondary | ICD-10-CM | POA: Diagnosis not present

## 2015-02-22 DIAGNOSIS — I2510 Atherosclerotic heart disease of native coronary artery without angina pectoris: Secondary | ICD-10-CM

## 2015-02-22 DIAGNOSIS — I6789 Other cerebrovascular disease: Secondary | ICD-10-CM | POA: Diagnosis not present

## 2015-02-22 DIAGNOSIS — Z7982 Long term (current) use of aspirin: Secondary | ICD-10-CM | POA: Diagnosis not present

## 2015-02-22 DIAGNOSIS — I739 Peripheral vascular disease, unspecified: Secondary | ICD-10-CM

## 2015-02-22 DIAGNOSIS — I119 Hypertensive heart disease without heart failure: Secondary | ICD-10-CM | POA: Diagnosis not present

## 2015-02-22 DIAGNOSIS — I6523 Occlusion and stenosis of bilateral carotid arteries: Secondary | ICD-10-CM | POA: Diagnosis not present

## 2015-02-22 DIAGNOSIS — N289 Disorder of kidney and ureter, unspecified: Secondary | ICD-10-CM | POA: Diagnosis not present

## 2015-02-22 DIAGNOSIS — E785 Hyperlipidemia, unspecified: Secondary | ICD-10-CM | POA: Diagnosis not present

## 2015-02-22 DIAGNOSIS — I63412 Cerebral infarction due to embolism of left middle cerebral artery: Secondary | ICD-10-CM | POA: Diagnosis not present

## 2015-02-22 DIAGNOSIS — Z955 Presence of coronary angioplasty implant and graft: Secondary | ICD-10-CM | POA: Diagnosis not present

## 2015-02-22 DIAGNOSIS — R29702 NIHSS score 2: Secondary | ICD-10-CM | POA: Diagnosis not present

## 2015-02-22 DIAGNOSIS — Z87891 Personal history of nicotine dependence: Secondary | ICD-10-CM | POA: Diagnosis not present

## 2015-02-22 DIAGNOSIS — R4781 Slurred speech: Secondary | ICD-10-CM | POA: Diagnosis not present

## 2015-02-22 DIAGNOSIS — Z7901 Long term (current) use of anticoagulants: Secondary | ICD-10-CM | POA: Diagnosis not present

## 2015-02-22 DIAGNOSIS — Z79899 Other long term (current) drug therapy: Secondary | ICD-10-CM | POA: Diagnosis not present

## 2015-02-22 DIAGNOSIS — G459 Transient cerebral ischemic attack, unspecified: Secondary | ICD-10-CM | POA: Diagnosis not present

## 2015-02-22 LAB — BASIC METABOLIC PANEL
Anion gap: 8 (ref 5–15)
BUN: 20 mg/dL (ref 6–20)
CALCIUM: 9 mg/dL (ref 8.9–10.3)
CHLORIDE: 101 mmol/L (ref 101–111)
CO2: 29 mmol/L (ref 22–32)
CREATININE: 1.01 mg/dL (ref 0.61–1.24)
GFR calc Af Amer: >60 mL/min (ref >60)
GLUCOSE: 116 mg/dL — AB (ref 65–99)
POTASSIUM: 3.8 mmol/L (ref 3.5–5.1)
SODIUM: 138 mmol/L (ref 135–145)

## 2015-02-22 LAB — CBC
HCT: 36 % — ABNORMAL LOW (ref 39.0–52.0)
HEMOGLOBIN: 12.5 g/dL — AB (ref 13.0–17.0)
MCH: 34.2 pg — AB (ref 26.0–34.0)
MCHC: 34.7 g/dL (ref 30.0–36.0)
MCV: 98.6 fL (ref 78.0–100.0)
Platelets: 175 10*3/uL (ref 150–400)
RBC: 3.65 MIL/uL — ABNORMAL LOW (ref 4.22–5.81)
RDW: 12.7 % (ref 11.5–15.5)
WBC: 7 10*3/uL (ref 4.0–10.5)

## 2015-02-22 LAB — LIPID PANEL
CHOLESTEROL: 147 mg/dL (ref 0–200)
HDL: 77 mg/dL (ref >40)
LDL Cholesterol: 65 mg/dL (ref 0–99)
Total CHOL/HDL Ratio: 1.9 RATIO
Triglycerides: 26 mg/dL (ref <150)
VLDL: 5 mg/dL (ref 0–40)

## 2015-02-22 LAB — RAPID URINE DRUG SCREEN, HOSP PERFORMED
AMPHETAMINES: NOT DETECTED
BARBITURATES: NOT DETECTED
BENZODIAZEPINES: NOT DETECTED
COCAINE: NOT DETECTED
Opiates: NOT DETECTED
TETRAHYDROCANNABINOL: NOT DETECTED

## 2015-02-22 MED ORDER — B COMPLEX-C PO TABS
1.0000 | ORAL_TABLET | Freq: Every day | ORAL | Status: DC
  Administered 2015-02-22 – 2015-02-23 (×2): 1 via ORAL
  Filled 2015-02-22 (×3): qty 1

## 2015-02-22 MED ORDER — HYDROMORPHONE HCL 1 MG/ML IJ SOLN: 0.5000 mg | INTRAMUSCULAR | Status: DC | PRN

## 2015-02-22 MED ORDER — APIXABAN 5 MG PO TABS
5.0000 mg | ORAL_TABLET | Freq: Two times a day (BID) | ORAL | Status: DC
  Administered 2015-02-22 – 2015-02-23 (×3): 5 mg via ORAL
  Filled 2015-02-22 (×3): qty 1

## 2015-02-22 MED ORDER — ADULT MULTIVITAMIN W/MINERALS CH
1.0000 | ORAL_TABLET | Freq: Every day | ORAL | Status: DC
  Administered 2015-02-22 – 2015-02-23 (×2): 1 via ORAL
  Filled 2015-02-22 (×2): qty 1

## 2015-02-22 MED ORDER — LOSARTAN POTASSIUM 50 MG PO TABS
100.0000 mg | ORAL_TABLET | Freq: Every day | ORAL | Status: DC
  Administered 2015-02-22: 100 mg via ORAL
  Filled 2015-02-22: qty 2

## 2015-02-22 MED ORDER — OMEGA-3-ACID ETHYL ESTERS 1 G PO CAPS
1.0000 g | ORAL_CAPSULE | Freq: Every day | ORAL | Status: DC
  Administered 2015-02-22 – 2015-02-23 (×2): 1 g via ORAL
  Filled 2015-02-22 (×2): qty 1

## 2015-02-22 MED ORDER — STROKE: EARLY STAGES OF RECOVERY BOOK
Freq: Once | Status: AC
  Administered 2015-02-22: 07:00:00
  Filled 2015-02-22: qty 1

## 2015-02-22 MED ORDER — ALUM & MAG HYDROXIDE-SIMETH 200-200-20 MG/5ML PO SUSP: 30.0000 mL | Freq: Four times a day (QID) | ORAL | Status: DC | PRN

## 2015-02-22 MED ORDER — OXYCODONE HCL 5 MG PO TABS: 5.0000 mg | ORAL_TABLET | ORAL | Status: DC | PRN

## 2015-02-22 MED ORDER — TAMSULOSIN HCL 0.4 MG PO CAPS
0.4000 mg | ORAL_CAPSULE | Freq: Every day | ORAL | Status: DC
  Administered 2015-02-22 – 2015-02-23 (×2): 0.4 mg via ORAL
  Filled 2015-02-22 (×2): qty 1

## 2015-02-22 MED ORDER — SODIUM CHLORIDE 0.9% FLUSH
3.0000 mL | Freq: Two times a day (BID) | INTRAVENOUS | Status: DC
  Administered 2015-02-22 – 2015-02-23 (×4): 3 mL via INTRAVENOUS

## 2015-02-22 MED ORDER — LORAZEPAM 2 MG/ML IJ SOLN
1.0000 mg | Freq: Once | INTRAMUSCULAR | Status: AC
  Administered 2015-02-22: 1 mg via INTRAVENOUS
  Filled 2015-02-22: qty 1

## 2015-02-22 MED ORDER — FINASTERIDE 5 MG PO TABS
5.0000 mg | ORAL_TABLET | Freq: Every day | ORAL | Status: DC
  Administered 2015-02-22 – 2015-02-23 (×2): 5 mg via ORAL
  Filled 2015-02-22 (×2): qty 1

## 2015-02-22 MED ORDER — ONDANSETRON HCL 4 MG PO TABS
4.0000 mg | ORAL_TABLET | Freq: Four times a day (QID) | ORAL | Status: DC | PRN
Start: 2015-02-22 — End: 2015-02-23

## 2015-02-22 MED ORDER — SODIUM CHLORIDE 0.9% FLUSH: 3.0000 mL | INTRAVENOUS | Status: DC | PRN

## 2015-02-22 MED ORDER — ROSUVASTATIN CALCIUM 40 MG PO TABS
40.0000 mg | ORAL_TABLET | Freq: Every evening | ORAL | Status: DC
  Administered 2015-02-22 – 2015-02-23 (×2): 40 mg via ORAL
  Filled 2015-02-22 (×3): qty 1

## 2015-02-22 MED ORDER — CALCIUM CARBONATE 1250 (500 CA) MG PO TABS
1250.0000 mg | ORAL_TABLET | Freq: Every day | ORAL | Status: DC
  Administered 2015-02-22 – 2015-02-23 (×2): 1250 mg via ORAL
  Filled 2015-02-22 (×2): qty 1

## 2015-02-22 MED ORDER — ONDANSETRON HCL 4 MG/2ML IJ SOLN: 4.0000 mg | Freq: Four times a day (QID) | INTRAMUSCULAR | Status: DC | PRN

## 2015-02-22 MED ORDER — ACETAMINOPHEN 650 MG RE SUPP: 650.0000 mg | Freq: Four times a day (QID) | RECTAL | Status: DC | PRN

## 2015-02-22 MED ORDER — SODIUM CHLORIDE 0.9 % IV SOLN
250.0000 mL | INTRAVENOUS | Status: DC | PRN
Start: 2015-02-22 — End: 2015-02-23

## 2015-02-22 MED ORDER — ASPIRIN 325 MG PO TABS
325.0000 mg | ORAL_TABLET | Freq: Every day | ORAL | Status: DC
  Administered 2015-02-22 – 2015-02-23 (×2): 325 mg via ORAL
  Filled 2015-02-22 (×2): qty 1

## 2015-02-22 MED ORDER — HYDROCHLOROTHIAZIDE 25 MG PO TABS
25.0000 mg | ORAL_TABLET | Freq: Every day | ORAL | Status: DC
  Administered 2015-02-22: 25 mg via ORAL
  Filled 2015-02-22: qty 1

## 2015-02-22 MED ORDER — DILTIAZEM HCL ER COATED BEADS 300 MG PO TB24: 300.0000 mg | ORAL_TABLET | Freq: Four times a day (QID) | ORAL | Status: DC | PRN

## 2015-02-22 MED ORDER — ACETAMINOPHEN 325 MG PO TABS: 650.0000 mg | ORAL_TABLET | Freq: Four times a day (QID) | ORAL | Status: DC | PRN

## 2015-02-22 MED ORDER — ASPIRIN 300 MG RE SUPP: 300.0000 mg | Freq: Every day | RECTAL | Status: DC

## 2015-02-22 MED ORDER — VITAMIN D 1000 UNITS PO TABS
1000.0000 [IU] | ORAL_TABLET | Freq: Every day | ORAL | Status: DC
  Administered 2015-02-22 – 2015-02-23 (×2): 1000 [IU] via ORAL
  Filled 2015-02-22 (×2): qty 1

## 2015-02-22 MED ORDER — SENNOSIDES-DOCUSATE SODIUM 8.6-50 MG PO TABS: 1.0000 | ORAL_TABLET | Freq: Every evening | ORAL | Status: DC | PRN

## 2015-02-22 NOTE — ED Notes (Signed)
Admitting at bedside 

## 2015-02-22 NOTE — ED Notes (Signed)
Pt sts he is claustrophobic and does not do well in MRIs.  MD made aware.

## 2015-02-22 NOTE — Care Management Obs Status (Signed)
[facility] NOTIFICATION   Patient Details  Name: [name] MRN: [id number] Date of Birth: [birth year]   Medicare Observation Status Notification Given:  Yes Code 44 placed on chart and explained to patient.    Guido Sander, RN 02/22/2015, 4:15 PM

## 2015-02-22 NOTE — H&P (Addendum)
[facility] Admission History and Physical       [name] [id number] DOB: [birth year] DOA: 02/21/2015  Referring physician: EDP PCP: Henrine Screws, MD  Specialists:   Chief Complaint:  Right Arm Weakness and Slurred Speech  HPI: [name] is a 75 y.o. male with a history of Paroxysmal Atrial Fibrillation on Eliquis Rx, Bilateral Carotid artery Stenosis (Carotid US on 03/2014), CAD, HTN, Hyperlipidemia, and PVD who presents to the ED with complaints of sudden onset of Right upper Extremity weakness at 10 PM. He rports that he was getting ready to go to bed, and his arm went numb and his arm was not working at all.   He told his wife something was wrong and she called EMS.  His wife had noticed that he had slurring of his speech.  In the ED, a code stroke was called, and he was seen by Neurology and a Ct scan of the Head was performed and was negative for acute findings.  An MRI/MRA was ordered and is pending at this time.  His symptoms began to improve while in the ED.  His NIHSS score was 2.      Review of Systems:  Constitutional: No Weight Loss, No Weight Gain, Night Sweats, Fevers, Chills, Dizziness, Light Headedness, Fatigue, or Generalized Weakness HEENT: No Headaches, Difficulty Swallowing,Tooth/Dental Problems,Sore Throat,  No Sneezing, Rhinitis, Ear Ache, Nasal Congestion, or Post Nasal Drip,  Cardio-vascular:  No Chest pain, Orthopnea, PND, Edema in Lower Extremities, Anasarca, Dizziness, Palpitations  Resp: No Dyspnea, No DOE, No Productive Cough, No Non-Productive Cough, No Hemoptysis, No Wheezing.    GI: No Heartburn, Indigestion, Abdominal Pain, Nausea, Vomiting, Diarrhea, Constipation, Hematemesis, Hematochezia, Melena, Change in Bowel Habits,  Loss of Appetite  GU: No Dysuria, No Change in Color of Urine, No Urgency or Urinary Frequency, No Flank pain.  Musculoskeletal: No Joint Pain or Swelling, No Decreased Range of Motion, No Back Pain.    Neurologic: No Syncope, No Seizures, +Right Arm Weakness, +Dysarthria, Paresthesia, Vision Disturbance or Loss, No Diplopia, No Vertigo, No Difficulty Walking,  Skin: No Rash or Lesions. Psych: No Change in Mood or Affect, No Depression or Anxiety, No Memory loss, No Confusion, or Hallucinations   Past Medical History  Diagnosis Date  . Hypertension   . Nocturia   . Hyperlipidemia   . Arthritis     HANDS AND FEET  . Cataract immature BILATERAL   . Hydrocele, left   . History of AAA (abdominal aortic aneurysm) repair 2000    W/ AORTOVIFEMORAL BYPASS  . Peripheral vascular disease ([place]) FOLLOWED BY DR EARLY-- VISIT NOTE 01-05-10 AND CAROTID / VASCULAR DOPPLER  RESULTS W/ CHART    ACTIVE WALKING PROGRAM  . Status post primary angioplasty with coronary stent 2002--  X2 BM STENTS  . 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 Retinal detachment   . Increased intraocular pressure   . History of inguinal herniorrhaphy   . History of diverticulosis     Noted on Colonoscopy  . Coronary artery disease CARDIOLOGIST- DR Tressia Miners TURNER-- LAST VISIT NOV  2012-- WILL REQUEST NOTE AND STRESS TEST    s/p PCI 2000  . Asymptomatic carotid artery stenosis BILATERAL     PER DOPPLER  01-05-10   RICA  1 - [id number]   LICA  40 - [id number] - followed by Dr. Donnetta Hutching  . Diverticulosis   . H/O heart artery stent 02/19/00  . Paroxysmal atrial fibrillation ([place]) 06/30/14    chad2vasc score is  at least 4     Past Surgical History  Procedure Laterality Date  . Coronary angioplasty  2002    PTCA  W/ X2 BM STENTS--   PROXIMA LAD  &  LEFT CIRCUMFLEX TO FIRST OBTUSE MARGINAL BRANCH  . Aaa repair w/ aortobifemoral bypass  OCT  91478  . Pulley release -- right 5th finger  2009  . Exploratory laparotomy w/ enterolysis for partial sbo  09-23-2008  . Retinal detachment surgery  2004    BILATERAL  . Abdominal hernia repair  X4  (LAST ONE 1990'S)    inguinal herniorrhaphy  . Hydrocele excision  01/24/2011    Procedure:  HYDROCELECTOMY ADULT;  Surgeon: Bernestine Amass, MD;  Location: [facility];  Service: Urology;  Laterality: Left;  . Tooth extraction  Dec 2013  . Carpal tunnel release Right Oct. 2013    Hand  . Ingrown toe nail Right Jan. 2015    Right Great Toe nail  . Intest blockage  09/22/08  . Heart stents  02/19/00      Prior to Admission medications   Medication Sig Start Date End Date Taking? Authorizing Provider  apixaban (ELIQUIS) 5 MG TABS tablet Take 1 tablet (5 mg total) by mouth 2 (two) times daily. 06/30/14  Yes Sueanne Margarita, MD  aspirin EC 81 MG tablet Take 81 mg by mouth daily.   Yes Historical Provider, MD  b complex vitamins tablet Take 1 tablet by mouth daily.     Yes Historical Provider, MD  calcium carbonate (OS-CAL) 600 MG TABS Take 600 mg by mouth daily.     Yes Historical Provider, MD  Cholecalciferol 1000 UNITS capsule Take 1,000 Units by mouth daily.   Yes Historical Provider, MD  Coenzyme Q10 (CO Q 10) 100 MG CAPS Take 1 capsule by mouth daily.   Yes Historical Provider, MD  diltiazem (CARDIZEM LA) 300 MG 24 hr tablet Take 300 mg by mouth 4 (four) times daily as needed (tachyarrhythmia).    Yes Historical Provider, MD  EPINEPHrine (EPIPEN) 0.3 mg/0.3 mL SOAJ injection Inject 0.3 mg into the muscle daily as needed (allergic reaction).    Yes Historical Provider, MD  finasteride (PROSCAR) 5 MG tablet Take 5 mg by mouth daily.   Yes Historical Provider, MD  hydrochlorothiazide (HYDRODIURIL) 25 MG tablet Take 25 mg by mouth daily.     Yes Historical Provider, MD  losartan (COZAAR) 100 MG tablet Take 100 mg by mouth daily.   Yes Historical Provider, MD  Magnesium Hydroxide (MAGNESIA PO) Take 1,000 mg by mouth daily.    Yes Historical Provider, MD  Multiple Vitamin (MULTIVITAMIN) tablet Take 1 tablet by mouth daily.     Yes Historical Provider, MD  Omega-3 Fatty Acids (FISH OIL PO) Take 1,200 mg by mouth daily.   Yes Historical Provider, MD  rosuvastatin (CRESTOR) 40  MG tablet Take 1 tablet (40 mg total) by mouth every evening. 12/16/14  Yes Sueanne Margarita, MD  Tamsulosin HCl (FLOMAX) 0.4 MG CAPS Take 0.4 mg by mouth daily.     Yes Historical Provider, MD  vitamin C (ASCORBIC ACID) 500 MG tablet Take 2,000 mg by mouth daily.   Yes Historical Provider, MD     Allergies  Allergen Reactions  . Wasp Venom Shortness Of Breath and Swelling    Social History:  reports that he quit smoking about 37 years ago. His smoking use included Cigarettes. He quit after 30 years of use. He has never used  smokeless tobacco. He reports that he drinks about 12.6 oz of alcohol per week. He reports that he does not use illicit drugs.    Family History  Problem Relation Age of Onset  . Heart disease Mother     AAA  Before age 30  . Cancer Mother 59    Colon cancer  . Hyperlipidemia Mother   . Heart failure Father        Physical Exam:  GEN:  Pleasant  75 y.o. male examined and in no acute distress; cooperative with exam Filed Vitals:   02/21/15 2308 02/21/15 2315 02/22/15 0030  BP: 164/68 137/66 127/62  Pulse: 92 91 77  Temp: 98.1 F (36.7 C)    TempSrc: Oral    Resp: 16 20 16   SpO2: 99% 100% 94%   Blood pressure 127/62, pulse 77, temperature 98.1 F (36.7 C), temperature source Oral, resp. rate 16, SpO2 94 %. PSYCH: SHe is alert and oriented x4; does not appear anxious does not appear depressed; affect is normal HEENT: Normocephalic and Atraumatic, Mucous membranes pink; PERRLA; EOM intact; Fundi:  Benign;  No scleral icterus, Nares: Patent, Oropharynx: Clear, Edentulous or Fair Dentition,    Neck:  FROM, No Cervical Lymphadenopathy nor Thyromegaly or Carotid Bruit; No JVD; Breasts:: Not examined CHEST WALL: No tenderness CHEST: Normal respiration, clear to auscultation bilaterally HEART: Regular rate and rhythm; no murmurs rubs or gallops BACK: No kyphosis or scoliosis; No CVA tenderness ABDOMEN: Positive Bowel Sounds, Scaphoid, Obese, Soft Non-Tender, No  Rebound or Guarding; No Masses, No Organomegaly, No Pannus; No Intertriginous candida. Rectal Exam: Not done EXTREMITIES: No Bone or Joint Deformity; Age-Appropriate Arthropathy of the Hands and knees; No Cyanosis, Clubbing, or Edema; No Ulcerations. Genitalia: not examined PULSES: 2+ and symmetric SKIN: Normal hydration no rash or ulceration  CNS:  Alert and Oriented x 4, No Focal Deficits Mental Status:  Alert, Oriented, Thought Content Appropriate. Speech Fluent without evidence of Aphasia. Able to follow 3 step commands without difficulty.  In No obvious pain.   Cranial Nerves:  II: Discs flat bilaterally; Visual fields Intact, Pupils equal and reactive.    III,IV, VI: Extra-ocular motions intact bilaterally    V,VII: smile symmetric, facial light touch sensation normal bilaterally    VIII: hearing intact Bilaterally    IX,X: gag reflex present    XI: bilateral shoulder shrug    XII: midline tongue extension   Motor:  Right:  Upper extremity 5/5     Left:  Upper extremity 5/5     Right:  Lower extremity 5/5    Left:  Lower extremity 5/5     Tone and Bulk:  normal tone throughout; no atrophy noted   Sensory:  Pinprick and light touch intact throughout, bilaterally   Deep Tendon Reflexes: 2+ and symmetric throughout   Plantars/ Babinski:  Right: normal Left: normal    Cerebellar:  Finger to nose without difficulty.   Gait: deferred    Vascular: pulses palpable throughout    Labs on Admission:  Basic Metabolic Panel:  Recent Labs Lab 02/21/15 2257 02/21/15 2300  NA 138 139  K 3.6 3.5  CL 99* 97*  CO2 27  --   GLUCOSE 114* 110*  BUN 21* 23*  CREATININE 1.31* 1.30*  CALCIUM 9.5  --    Liver Function Tests:  Recent Labs Lab 02/21/15 2257  AST 29  ALT 19  ALKPHOS 34*  BILITOT 0.5  PROT 6.6  ALBUMIN 3.7   No results for input(s):  LIPASE, AMYLASE in the last 168 hours. No results for input(s): AMMONIA in the last 168 hours. CBC:  Recent Labs Lab  02/21/15 2257 02/21/15 2300  WBC 8.6  --   NEUTROABS 5.8  --   HGB 12.7* 13.6  HCT 37.0* 40.0  MCV 99.2  --   PLT 173  --    Cardiac Enzymes: No results for input(s): CKTOTAL, CKMB, CKMBINDEX, TROPONINI in the last 168 hours.  BNP (last 3 results)  Recent Labs  06/10/14 1723  BNP 132.7*    ProBNP (last 3 results) No results for input(s): PROBNP in the last 8760 hours.  CBG:  Recent Labs Lab 02/21/15 2310  GLUCAP 114*    Radiological Exams on Admission: Ct Head Wo Contrast  02/21/2015  CLINICAL DATA:  Code stroke for right-sided weakness and resolving slurred speech. EXAM: CT HEAD WITHOUT CONTRAST TECHNIQUE: Contiguous axial images were obtained from the base of the skull through the vertex without intravenous contrast. COMPARISON:  None. FINDINGS: Skull and Sinuses:Negative for fracture or destructive process. The visualized mastoids, middle ears, and imaged paranasal sinuses are clear. Visualized orbits: Sequela of right retinal detachment surgery, including scleral band. Brain: No evidence of acute infarction, hemorrhage, hydrocephalus, or mass lesion/mass effect. ASPECTS is 10. No abnormal vessel hyperdensity. Intracranial calcified atherosclerosis. Mild small vessel ischemic change for age. Generalized cerebral volume loss. Critical Value/emergent results were called by telephone at the time of interpretation on 02/21/2015 at 11:12 pm to Dr. Nicole Kindred , who verbally acknowledged these results. IMPRESSION: No acute hemorrhage or visible infarct. Electronically Signed   By: Monte Fantasia M.D.   On: 02/21/2015 23:14     EKG: Independently reviewed.  Normal Sinus Rhythm rate = 95      Assessment/Plan:   75 y.o. male with  Active Problems:    1.     Stroke [facility])    CVA/TIA workup    MRI/MRA Brain ordered      2.     CVA (cerebral infarction)- same as #1      3.     Asymptomatic carotid artery stenosis    On Carotid US 03/2014      4.     Paroxysmal atrial  fibrillation (HCC)    On Eliquis  Rx      5.     Coronary atherosclerosis of native coronary artery    On ASA. Crestor and Losartan Rx    Cardiac Monitoring      6.     Peripheral vascular disease, unspecified ([place])    On ASA      7.     Benign hypertensive heart disease without heart failure    On Losartan, Diltiazem    Monitor BPs      8.     DVT Prophylaxis    On Eliquis Rx       Code Status:     FULL CODE      Family Communication:   Family at Bedside    Disposition Plan:    Outpatient Status        Time spent:  52 Minutes      Theressa Millard [facility] Pager [phone]   If [place] Please Contact the Day Rounding Team MD for [facility]  If 7PM-7AM, Please Contact Night-Floor Coverage  [email or web address] Password [email or web address] 02/22/2015, 12:48 AM     ADDENDUM:   Patient was seen and examined on 02/22/2015

## 2015-02-22 NOTE — Progress Notes (Signed)
[facility] PROGRESS NOTE  CONNELL MAGNESS [id number] DOB: 03-Oct-1940 DOA: 02/21/2015 PCP: Henrine Screws, MD  Assessment/Plan: 1. Acute cerebrovascular accident -Mr Blamer is a 75 year old gentleman with multiple cardiovascular risk factors having a history of paroxysmal atrial fibrillation on chronic anticoagulation with Eliquis who presented to the emergency department with left upper extremity weakness associate with slurred speech. Symptoms improved by the following morning -MRI of brain showing multiple small acute infarcts in left MCA territory consistent with embolic phenomenon. -Neurology consulted, his aspirin was increased from 81-325 mg by mouth daily with a continuation of chronic anticoagulation with Eliquis.  -Pending transthoracic echocardiogram and carotid Dopplers -Physical therapy, occupational therapy consultation  2.  Dyslipidemia -Continue Crestor 40 mg by mouth daily -Fast panel showed LDL of 65.  3.  Hypertension. -Blood pressure stable  -Will allow permissive hypertension, hold antihypertensive agents  4.  Paroxysmal atrial fibrillation -Continue chronic anticoagulation with Eliquis   Code Status: Full code Family Communication: Spoke with his wife was present at bedside Disposition Plan: Anticipate discharge home when medically stable   Consultants:  Neurology  Procedures:  Pending transthoracic echocardiogram   HPI/Subjective: Mr Kosub is a pleasant 75 year old gentleman with a past medical history of paroxysmal atrial fibrillation on chronic anticoagulation with Eliquis, status post bilateral carotid artery stenosis, coronary artery disease, admitted to the medicine service on 02/22/2015 when he presented with right upper extremity weakness or associate with slurred speech. Symptoms happening at home as he was getting ready for bed. He was further worked up with MRI of brain that revealed multiple small acute infarcts involving the  left MCA territory consistent with embolic phenomena. Patient was seen and evaluated by neurology. He was continued on anticoagulation with Eliquis with aspirin dose being increased from 81-325 mg by mouth daily.   Objective: Filed Vitals:   02/22/15 0625 02/22/15 0800  BP: 146/64 122/64  Pulse: 58 67  Temp: 98.7 F (37.1 C) 98.7 F (37.1 C)  Resp: 16     Intake/Output Summary (Last 24 hours) at 02/22/15 1336 Last data filed at 02/22/15 1000  Gross per 24 hour  Intake    240 ml  Output      1 ml  Net    239 ml   Filed Weights   02/22/15 0229  Weight: 85.276 kg (188 lb)    Exam:   General:  Patient is awake and alert, no acute distress  Cardiovascular: Regular rate and rhythm normal S1-S2  Respiratory: Normal respiratory effort lungs are clear  Abdomen: Soft nontender nondistended  Musculoskeletal: No edema  Neurological: He has 4-5 muscle strength to his right upper extremity, finger to nose on right upper extremity is somewhat compromised, left upper extremity and bilateral extremity strength is intact. Sensation intact, having 2+ bilateral deep tendon reflexes  Data Reviewed: Basic Metabolic Panel:  Recent Labs Lab 02/21/15 2257 02/21/15 2300 02/22/15 0537  NA 138 139 138  K 3.6 3.5 3.8  CL 99* 97* 101  CO2 27  --  29  GLUCOSE 114* 110* 116*  BUN 21* 23* 20  CREATININE 1.31* 1.30* 1.01  CALCIUM 9.5  --  9.0   Liver Function Tests:  Recent Labs Lab 02/21/15 2257  AST 29  ALT 19  ALKPHOS 34*  BILITOT 0.5  PROT 6.6  ALBUMIN 3.7   No results for input(s): LIPASE, AMYLASE in the last 168 hours. No results for input(s): AMMONIA in the last 168 hours. CBC:  Recent Labs Lab 02/21/15 2257 02/21/15 2300  02/22/15 0537  WBC 8.6  --  7.0  NEUTROABS 5.8  --   --   HGB 12.7* 13.6 12.5*  HCT 37.0* 40.0 36.0*  MCV 99.2  --  98.6  PLT 173  --  175   Cardiac Enzymes: No results for input(s): CKTOTAL, CKMB, CKMBINDEX, TROPONINI in the last 168  hours. BNP (last 3 results)  Recent Labs  06/10/14 1723  BNP 132.7*    ProBNP (last 3 results) No results for input(s): PROBNP in the last 8760 hours.  CBG:  Recent Labs Lab 02/21/15 2310  GLUCAP 114*    No results found for this or any previous visit (from the past 240 hour(s)).   Studies: Ct Head Wo Contrast  02/21/2015  CLINICAL DATA:  Code stroke for right-sided weakness and resolving slurred speech. EXAM: CT HEAD WITHOUT CONTRAST TECHNIQUE: Contiguous axial images were obtained from the base of the skull through the vertex without intravenous contrast. COMPARISON:  None. FINDINGS: Skull and Sinuses:Negative for fracture or destructive process. The visualized mastoids, middle ears, and imaged paranasal sinuses are clear. Visualized orbits: Sequela of right retinal detachment surgery, including scleral band. Brain: No evidence of acute infarction, hemorrhage, hydrocephalus, or mass lesion/mass effect. ASPECTS is 10. No abnormal vessel hyperdensity. Intracranial calcified atherosclerosis. Mild small vessel ischemic change for age. Generalized cerebral volume loss. Critical Value/emergent results were called by telephone at the time of interpretation on 02/21/2015 at 11:12 pm to Dr. Nicole Kindred , who verbally acknowledged these results. IMPRESSION: No acute hemorrhage or visible infarct. Electronically Signed   By: Monte Fantasia M.D.   On: 02/21/2015 23:14   Mr Angiogram Head Wo Contrast  02/22/2015  CLINICAL DATA:  Acute onset RIGHT upper extremity weakness at 10 p.m., and slurred speech. History of atrial fibrillation on Eliquis, carotid artery stenosis, hypertension. EXAM: MRI HEAD WITHOUT CONTRAST MRA HEAD WITHOUT CONTRAST TECHNIQUE: Multiplanar, multiecho pulse sequences of the brain and surrounding structures were obtained without intravenous contrast. Angiographic images of the head were obtained using MRA technique without contrast. COMPARISON:  CT head February 21, 2015 FINDINGS: MRI  HEAD FINDINGS Multiple subcentimeter foci of reduced diffusion LEFT frontal and parietal cortex, limited assessment on ADC map due to small size The ventricles and sulci are normal for patient's age. Patchy supratentorial white matter FLAIR T2 hyperintensities. No abnormal parenchymal signal, mass lesions, mass effect. No susceptibility artifact to suggest hemorrhage. No abnormal extra-axial fluid collections. No extra-axial masses though, contrast enhanced sequences would be more sensitive. Normal major intracranial vascular flow voids seen at the skull base. Probable LEFT parietal developmental venous anomaly. Status post RIGHT ocular globe scleral banding. No abnormal sellar expansion. No suspicious calvarial bone marrow signal. Craniocervical junction maintained. Visualized paranasal sinuses and mastoid air cells are well-aerated. 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 MRA HEAD FINDINGS Anterior circulation: Normal flow related enhancement of the included cervical, petrous, cavernous and supraclinoid internal carotid arteries. Patent anterior communicating artery. Normal flow related enhancement of the anterior and middle cerebral arteries, including distal segments. Supernumerary anterior cerebral artery arising from LEFT A1-2 junction. No large vessel occlusion, high-grade stenosis, abnormal luminal irregularity, aneurysm. Posterior circulation: Codominant vertebral artery's. Basilar artery is patent, with normal flow related enhancement of the main branch vessels. Normal flow related enhancement of the posterior cerebral arteries. No large vessel occlusion, high-grade stenosis, abnormal luminal irregularity, aneurysm. IMPRESSION: MRI HEAD: Multiple small acute infarcts LEFT MCA territory most consistent with embolic phenomena. Involutional changes. Mild to moderate chronic small vessel ischemic disease.  MRA HEAD: No acute large vessel occlusion or high-grade stenosis. Electronically  Signed   By: Elon Alas M.D.   On: 02/22/2015 02:07   Mr Brain Wo Contrast  02/22/2015  CLINICAL DATA:  Acute onset RIGHT upper extremity weakness at 10 p.m., and slurred speech. History of atrial fibrillation on Eliquis, carotid artery stenosis, hypertension. EXAM: MRI HEAD WITHOUT CONTRAST MRA HEAD WITHOUT CONTRAST TECHNIQUE: Multiplanar, multiecho pulse sequences of the brain and surrounding structures were obtained without intravenous contrast. Angiographic images of the head were obtained using MRA technique without contrast. COMPARISON:  CT head February 21, 2015 FINDINGS: MRI HEAD FINDINGS Multiple subcentimeter foci of reduced diffusion LEFT frontal and parietal cortex, limited assessment on ADC map due to small size The ventricles and sulci are normal for patient's age. Patchy supratentorial white matter FLAIR T2 hyperintensities. No abnormal parenchymal signal, mass lesions, mass effect. No susceptibility artifact to suggest hemorrhage. No abnormal extra-axial fluid collections. No extra-axial masses though, contrast enhanced sequences would be more sensitive. Normal major intracranial vascular flow voids seen at the skull base. Probable LEFT parietal developmental venous anomaly. Status post RIGHT ocular globe scleral banding. No abnormal sellar expansion. No suspicious calvarial bone marrow signal. Craniocervical junction maintained. Visualized paranasal sinuses and mastoid air cells are well-aerated. 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 MRA HEAD FINDINGS Anterior circulation: Normal flow related enhancement of the included cervical, petrous, cavernous and supraclinoid internal carotid arteries. Patent anterior communicating artery. Normal flow related enhancement of the anterior and middle cerebral arteries, including distal segments. Supernumerary anterior cerebral artery arising from LEFT A1-2 junction. No large vessel occlusion, high-grade stenosis, abnormal luminal  irregularity, aneurysm. Posterior circulation: Codominant vertebral artery's. Basilar artery is patent, with normal flow related enhancement of the main branch vessels. Normal flow related enhancement of the posterior cerebral arteries. No large vessel occlusion, high-grade stenosis, abnormal luminal irregularity, aneurysm. IMPRESSION: MRI HEAD: Multiple small acute infarcts LEFT MCA territory most consistent with embolic phenomena. Involutional changes. Mild to moderate chronic small vessel ischemic disease. MRA HEAD: No acute large vessel occlusion or high-grade stenosis. Electronically Signed   By: Elon Alas M.D.   On: 02/22/2015 02:07    Scheduled Meds: . apixaban  5 mg Oral BID  . aspirin  300 mg Rectal Daily   Or  . aspirin  325 mg Oral Daily  . B-complex with vitamin C  1 tablet Oral Daily  . calcium carbonate  1,250 mg Oral Daily  . cholecalciferol  1,000 Units Oral Daily  . finasteride  5 mg Oral Daily  . hydrochlorothiazide  25 mg Oral Daily  . losartan  100 mg Oral Daily  . multivitamin with minerals  1 tablet Oral Daily  . omega-3 acid ethyl esters  1 g Oral Daily  . rosuvastatin  40 mg Oral QPM  . sodium chloride flush  3 mL Intravenous Q12H  . sodium chloride flush  3 mL Intravenous Q12H  . tamsulosin  0.4 mg Oral Daily   Continuous Infusions:   Active Problems:   Asymptomatic carotid artery stenosis   Peripheral vascular disease, unspecified (HCC)   Benign hypertensive heart disease without heart failure   Coronary atherosclerosis of native coronary artery   Paroxysmal atrial fibrillation (HCC)   Stroke (HCC)   CVA (cerebral infarction)   Hyperlipemia    Time spent: 35 min    Kelvin Cellar  [facility] Pager [phone]. If 7PM-7AM, please contact night-coverage at [email or web address], password [facility] 02/22/2015, 1:36 PM  LOS: 0 days

## 2015-02-22 NOTE — Progress Notes (Signed)
STROKE TEAM PROGRESS NOTE   HISTORY AT THE TIME OF aDMISSION [name] is an 75 y.o. male with atrial fibrillation on anticoagulation, hypertension, hyperlipidemia, peripheral vascular disease and coronary artery disease, brought to the emergency room and code stroke status following the acute onset of right upper extremity weakness and slurred speech. He was last known well at 10 PM tonight. He has no previous history of stroke nor TIA. In addition to anticoagulation with Eliquis, he also takes aspirin daily. CT scan of his head showed no acute intracranial abnormality. Deficits improved fairly rapidly with return of speech to being essentially normal, as well as improved strength of his right upper extremity. NIH stroke score was 2.  LSN: 10:00 PM on 02/21/2015 tPA Given: No: On Eliquis; rapidly resolving deficits mRankin:   SUBJECTIVE (INTERVAL HISTORY) His wife is at the bedside.  Overall he feels his condition is completely resolved. He has no complaints to full ROS  OBJECTIVE Temp:  [97.6 F (36.4 C)-98.7 F (37.1 C)] 98.7 F (37.1 C) (02/05 0625) Pulse Rate:  [58-92] 58 (02/05 0625) Cardiac Rhythm:  [-] Normal sinus rhythm (02/05 0241) Resp:  [16-20] 16 (02/05 0625) BP: (127-164)/(62-70) 146/64 mmHg (02/05 0625) SpO2:  [94 %-100 %] 96 % (02/05 0625) Weight:  [85.276 kg (188 lb)] 85.276 kg (188 lb) (02/05 0229)  CBC:  Recent Labs Lab 02/21/15 2257 02/21/15 2300 02/22/15 0537  WBC 8.6  --  7.0  NEUTROABS 5.8  --   --   HGB 12.7* 13.6 12.5*  HCT 37.0* 40.0 36.0*  MCV 99.2  --  98.6  PLT 173  --  0000000    Basic Metabolic Panel:  Recent Labs Lab 02/21/15 2257 02/21/15 2300 02/22/15 0537  NA 138 139 138  K 3.6 3.5 3.8  CL 99* 97* 101  CO2 27  --  29  GLUCOSE 114* 110* 116*  BUN 21* 23* 20  CREATININE 1.31* 1.30* 1.01  CALCIUM 9.5  --  9.0    Lipid Panel:    Component Value Date/Time   CHOL 147 02/22/2015 0537   CHOL 164 05/24/2013 0916   TRIG 26  02/22/2015 0537   TRIG 50 05/24/2013 0916   HDL 77 02/22/2015 0537   HDL 84 05/24/2013 0916   CHOLHDL 1.9 02/22/2015 0537   VLDL 5 02/22/2015 0537   LDLCALC 65 02/22/2015 0537   LDLCALC 70 05/24/2013 0916   HgbA1c:  Lab Results  Component Value Date   HGBA1C 5.7* 06/11/2014   Urine Drug Screen:    Component Value Date/Time   LABOPIA NONE DETECTED 02/21/2015 2319   COCAINSCRNUR NONE DETECTED 02/21/2015 2319   LABBENZ NONE DETECTED 02/21/2015 2319   AMPHETMU NONE DETECTED 02/21/2015 2319   THCU NONE DETECTED 02/21/2015 2319   LABBARB NONE DETECTED 02/21/2015 2319      IMAGING  Ct Head Wo Contrast 02/21/2015   No acute hemorrhage or visible infarct.   Mr Angiogram Head Wo Contrast 02/22/2015    MRI HEAD:  Multiple small acute infarcts LEFT MCA territory most consistent with embolic phenomena. Involutional changes. Mild to moderate chronic small vessel ischemic disease.   MRA HEAD:  No acute large vessel occlusion or high-grade stenosis.   PHYSICAL EXAM Physical Exam General - Well nourished, well developed, in NAD   Cardiovascular - Regular rate and rhythm Pulmonary: CTA Abdomen: NT, ND, normal bowel sounds Extremities: No C/C/E  Neurological Exam Mental Status: Normal Orientation:  Oriented to person, place and time Speech:  Fluent;  no dysarthria  Cranial Nerves:  PERRL; EOMI; visual fields full, face grossly symmetric, hearing grossly intact; shrug symmetric and tongue midline  Motor Exam:  Tone:  Within normal limits; Strength: 5/5 throughout  Sensory: Intact to light touch throughout  Coordination:  Intact finger to nose  Gait: Deferred  ASSESSMENT/PLAN Mr. [name] is a 75 y.o. male with history of atrial fibrillation on anticoagulation, hypertension, hyperlipidemia, peripheral vascular disease and coronary artery disease  presenting with right upper extremity weakness and slurred speech. He did not receive IV t-PA due to Eliquis therapy  and proving deficits.   Strokes:  Dominant infarcts LMCA felt to be embolic from atrial fibrillation.  Resultant  Right arm weakness and slurred speech.  This resolve  MRI - Multiple small acute infarcts LEFT MCA territory most consistent with embolic phenomena.  MRA - No acute large vessel occlusion or high-grade stenosis.   Carotid Doppler - will order  2D Echo - will order  LDL - 65  HgbA1c pending  VTE prophylaxis - Eliquis  Diet Heart Room service appropriate?: Yes; Fluid consistency:: Thin  Eliquis (apixaban) daily and aspirin 81 mg daily pta, now on Eliquis (apixaban) daily and aspirin 325 mg daily.  Patient counseled to be compliant with his antithrombotic medications  Ongoing aggressive stroke risk factor management  Therapy recommendations: Pending  Disposition: Pending  Hypertension  Stable  Permissive hypertension (OK if < 220/120) but gradually normalize in 5-7 days  Hyperlipidemia  Home meds:  Crestor 40 mg daily resumed in hospital  LDL pending, goal < 70  Continue statin at discharge  Diabetes  HgbA1c pending, goal < 7.0  Controlled  Other Stroke Risk Factors  Advanced age  Cigarette smoker, quit smoking 37 years ago.  ETOH use  Coronary artery disease   Other Active Problems  Mild anemia  Hospital day # 0  Mikey Bussing PA-C [facility] Pager [phone] 02/22/2015, 9:02 AM  ATTENDING NOTE: Patient was seen and examined by me personally. Documentation reflects findings. The laboratory and radiographic studies reviewed by me. ROS completed by me personally and pertinent positives fully documented   Condition: stable Assessment and plan completed by me personally and fully documented above. Plans/Recommendations include:     Discussed the current NOAC and ASA with patient.  He understands this may not change.  However, will await completion of stroke work-up studies and plan secondary prevention.  Patient  has known carotid stenosis that is followed by Dr. Donnetta Hutching annually.  Patient was due for review in the next two month.  We will do the evaluation here  Stroke work-up ongoing  Patient quit smoking 35 years ago  SIGNED BY: Dr. Elissa Hefty     To contact Stroke Continuity provider, please refer to [email or web address] After hours, contact General Neurology

## 2015-02-23 ENCOUNTER — Inpatient Hospital Stay (HOSPITAL_COMMUNITY): Payer: PPO

## 2015-02-23 ENCOUNTER — Encounter (HOSPITAL_COMMUNITY): Payer: Self-pay | Admitting: Radiology

## 2015-02-23 DIAGNOSIS — I63033 Cerebral infarction due to thrombosis of bilateral carotid arteries: Secondary | ICD-10-CM | POA: Diagnosis not present

## 2015-02-23 DIAGNOSIS — I6789 Other cerebrovascular disease: Secondary | ICD-10-CM

## 2015-02-23 DIAGNOSIS — I639 Cerebral infarction, unspecified: Secondary | ICD-10-CM | POA: Diagnosis not present

## 2015-02-23 DIAGNOSIS — E785 Hyperlipidemia, unspecified: Secondary | ICD-10-CM | POA: Diagnosis not present

## 2015-02-23 DIAGNOSIS — I480 Paroxysmal atrial fibrillation: Secondary | ICD-10-CM | POA: Diagnosis not present

## 2015-02-23 DIAGNOSIS — G459 Transient cerebral ischemic attack, unspecified: Secondary | ICD-10-CM | POA: Diagnosis not present

## 2015-02-23 DIAGNOSIS — I2510 Atherosclerotic heart disease of native coronary artery without angina pectoris: Secondary | ICD-10-CM | POA: Diagnosis not present

## 2015-02-23 LAB — HEMOGLOBIN A1C
HEMOGLOBIN A1C: 5.6 % (ref 4.8–5.6)
MEAN PLASMA GLUCOSE: 114 mg/dL

## 2015-02-23 LAB — BASIC METABOLIC PANEL
ANION GAP: 10 (ref 5–15)
BUN: 13 mg/dL (ref 6–20)
CALCIUM: 9.4 mg/dL (ref 8.9–10.3)
CO2: 27 mmol/L (ref 22–32)
Chloride: 97 mmol/L — ABNORMAL LOW (ref 101–111)
Creatinine, Ser: 0.95 mg/dL (ref 0.61–1.24)
Glucose, Bld: 111 mg/dL — ABNORMAL HIGH (ref 65–99)
Potassium: 3.9 mmol/L (ref 3.5–5.1)
Sodium: 134 mmol/L — ABNORMAL LOW (ref 135–145)

## 2015-02-23 LAB — CBC
HCT: 39.3 % (ref 39.0–52.0)
HEMOGLOBIN: 13.6 g/dL (ref 13.0–17.0)
MCH: 34.3 pg — ABNORMAL HIGH (ref 26.0–34.0)
MCHC: 34.6 g/dL (ref 30.0–36.0)
MCV: 99 fL (ref 78.0–100.0)
Platelets: 194 10*3/uL (ref 150–400)
RBC: 3.97 MIL/uL — AB (ref 4.22–5.81)
RDW: 12.6 % (ref 11.5–15.5)
WBC: 7.3 10*3/uL (ref 4.0–10.5)

## 2015-02-23 MED ORDER — ASPIRIN EC 81 MG PO TBEC: 81.0000 mg | DELAYED_RELEASE_TABLET | Freq: Every day | ORAL | Status: DC

## 2015-02-23 MED ORDER — IOHEXOL 350 MG/ML SOLN
50.0000 mL | Freq: Once | INTRAVENOUS | Status: AC | PRN
  Administered 2015-02-23: 50 mL via INTRAVENOUS

## 2015-02-23 NOTE — Progress Notes (Signed)
SLP Cancellation Note  Patient Details Name: [name] MRN: [id number] DOB: 1940/08/23   Cancelled treatment:       Reason Eval/Treat Not Completed: SLP screened, no needs identified, will sign off   Juan Quam Laurice 02/23/2015, 9:57 AM

## 2015-02-23 NOTE — Progress Notes (Signed)
D/C orders received, pt for D/C home today.  IV and telemetry D/C.  Rx and D/C instructions given with verbalized understanding.  Family at bedside to assist with D/C.  Staff brought pt downstairs via wheelchair.  

## 2015-02-23 NOTE — Care Management Note (Signed)
Case Management Note  Patient Details  Name: BUTCH CRADER MRN: [id number] Date of Birth: 03-02-1940  Subjective/Objective:                    Action/Plan: Patient was admitted with CVA. Lives at home with spouse. Will follow for discharge needs pending PT/OT evals and physician orders.  Expected Discharge Date:  02/25/15               Expected Discharge Plan:     In-House Referral:     Discharge planning Services     Post Acute Care Choice:    Choice offered to:     DME Arranged:    DME Agency:     HH Arranged:    HH Agency:     Status of Service:  In process, will continue to follow  Medicare Important Message Given:    Date Medicare IM Given:    Medicare IM give by:    Date Additional Medicare IM Given:    Additional Medicare Important Message give by:     If discussed at [place] of Stay Meetings, dates discussed:    Additional Comments:  Rolm Baptise, RN 02/23/2015, 11:56 AM [phone]

## 2015-02-23 NOTE — Progress Notes (Signed)
OT Cancellation Note  Patient Details Name: LISSANDRO CROCKER MRN: [id number] DOB: 1940-06-23   Cancelled Treatment:    Reason Eval/Treat Not Completed: OT screened, no needs identified, will sign off  Darlina Rumpf [place], OTR/L [id number]  02/23/2015, 11:02 AM

## 2015-02-23 NOTE — Progress Notes (Signed)
STROKE TEAM PROGRESS NOTE   HISTORY OF PRESENT ILLNESS [name] is an 75 y.o. male with atrial fibrillation on anticoagulation, hypertension, hyperlipidemia, peripheral vascular disease and coronary artery disease, brought to the emergency room and code stroke status following the acute onset of right upper extremity weakness and slurred speech. He was last known well at 10 PM tonight 02/21/2015. He has no previous history of stroke nor TIA. In addition to anticoagulation with Eliquis, he also takes aspirin daily. CT scan of his head showed no acute intracranial abnormality. Deficits improved fairly rapidly with return of speech to being essentially normal, as well as improved strength of his right upper extremity. NIH stroke score was 2. Patient was not administered TPA secondary to On Eliquis; rapidly resolving deficits. He was admitted for further evaluation and treatment.   SUBJECTIVE (INTERVAL HISTORY) No family is at the bedside.  Overall he feels his condition is stable. He is anxious to discharge as his wife having surgery today.   OBJECTIVE Temp:  [97.8 F (36.6 C)-98.7 F (37.1 C)] 98.2 F (36.8 C) (02/06 1349) Pulse Rate:  [57-84] 61 (02/06 1349) Cardiac Rhythm:  [-] Sinus bradycardia (02/06 0700) Resp:  [18-20] 20 (02/06 1349) BP: (115-138)/(56-80) 118/61 mmHg (02/06 1349) SpO2:  [95 %-100 %] 100 % (02/06 1349)  CBC:  Recent Labs Lab 02/21/15 2257  02/22/15 0537 02/23/15 0804  WBC 8.6  --  7.0 7.3  NEUTROABS 5.8  --   --   --   HGB 12.7*  < > 12.5* 13.6  HCT 37.0*  < > 36.0* 39.3  MCV 99.2  --  98.6 99.0  PLT 173  --  175 194  < > = values in this interval not displayed.  Basic Metabolic Panel:   Recent Labs Lab 02/22/15 0537 02/23/15 0804  NA 138 134*  K 3.8 3.9  CL 101 97*  CO2 29 27  GLUCOSE 116* 111*  BUN 20 13  CREATININE 1.01 0.95  CALCIUM 9.0 9.4    Lipid Panel:     Component Value Date/Time   CHOL 147 02/22/2015 0537   CHOL 164  05/24/2013 0916   TRIG 26 02/22/2015 0537   TRIG 50 05/24/2013 0916   HDL 77 02/22/2015 0537   HDL 84 05/24/2013 0916   CHOLHDL 1.9 02/22/2015 0537   VLDL 5 02/22/2015 0537   LDLCALC 65 02/22/2015 0537   LDLCALC 70 05/24/2013 0916   HgbA1c:  Lab Results  Component Value Date   HGBA1C 5.6 02/22/2015   Urine Drug Screen:     Component Value Date/Time   LABOPIA NONE DETECTED 02/21/2015 2319   COCAINSCRNUR NONE DETECTED 02/21/2015 2319   LABBENZ NONE DETECTED 02/21/2015 2319   AMPHETMU NONE DETECTED 02/21/2015 2319   THCU NONE DETECTED 02/21/2015 2319   LABBARB NONE DETECTED 02/21/2015 2319      IMAGING  Ct Head Wo Contrast 02/21/2015  No acute hemorrhage or visible infarct.   MRI HEAD 02/22/2015 Multiple small acute infarcts LEFT MCA territory most consistent with embolic phenomena. Involutional changes. Mild to moderate chronic small vessel ischemic disease.   MRA HEAD 02/22/2015 No acute large vessel occlusion or high-grade stenosis.   Carotid Doppler   There is 40-59% right ICA stenosis and 60-79% left ICA stenosis. There has been an increase in velocities, bilaterally, since study done 03/18/14. Vertebral artery flow is antegrade.   2D Echocardiogram  - Left ventricle: The cavity size was normal. Wall thickness wasincreased in a pattern of moderate  LVH. Systolic function wasnormal. The estimated ejection fraction was in the range of 60%to 65%. Wall motion was normal; there were no regional wallmotion abnormalities. Doppler parameters are consistent withabnormal left ventricular relaxation (grade 1 diastolicdysfunction). The E/e&' ratio is between 8-15, suggestingindeteminate LV filling pressure. - Aortic valve: Trileaflet; mildly calcified leaflets. There was nostenosis. There was no significant regurgitation. - Mitral valve: Moderate posterior calcified annulus. Mildlythickened leaflets . - Left atrium: Severely dilated at 58 ml/m2. - Right ventricle: The cavity  size was mildly dilated. Systolicfunction was normal. - Right atrium: The atrium was mildly dilated. - Atrial septum: Aneurysmal IAS - PFO cannot be excluded. - Tricuspid valve: There was mild regurgitation. - Pulmonary arteries: PA peak pressure: 26 mm Hg (S). - Inferior vena cava: The vessel was normal in size. The respirophasic diameter changes were in the normal range (= 50%),consistent with normal central venous pressure. - Impressions:  Compared with a prior study in 07/2014, there are few changes  except there is now severe LAE. An atrial septal aneurysm is noted, PFO cannot be excluded.    PHYSICAL EXAM General - Well nourished, well developed, in NAD  Cardiovascular - Regular rate and rhythm Pulmonary: CTA Abdomen: NT, ND, normal bowel sounds Extremities: No C/C/E  Neurological Exam Mental Status: Normal Orientation: Oriented to person, place and time Speech: Fluent; no dysarthria Cranial Nerves: PERRL; EOMI; visual fields full, face grossly symmetric, hearing grossly intact; shrug symmetric and tongue midline Motor Exam:  Tone: Within normal limits; Strength: 5/5 throughout Sensory: Intact to light touch throughout Coordination: Intact finger to nose Gait: Deferred   ASSESSMENT/PLAN [name] is a 75 y.o. male with history of atrial fibrillation on anticoagulation, hypertension, hyperlipidemia, peripheral vascular disease and coronary artery disease presenting with slurred speech and right upper extremity weakness. He did not receive IV t-PA due to on Eliquis; rapidly resolving deficits .   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 Needs further evaluation.  Resultant  Neurologic deficits resolved  MRI  L MCA embolic infarcts  MRA  No large vessels occlusion or stenosis  Carotid Doppler  R ICA 40-59%, L ICA 60-79%  2D Echo  Severe LAE, atrial septal aneurysm  CTA ordered to  closer look at carotid stenosis   LDL 65  HgbA1c 5.6  Eliquis for VTE prophylaxis Diet Heart Room service appropriate?: Yes; Fluid consistency:: Thin  aspirin 81 mg daily and Eliquis (apixaban) daily prior to admission, now on aspirin 325 mg daily and Eliquis (apixaban) daily. Recommend decreasing aspirin dose to 81 mg. Order written. Per Dr. Theodosia Blender last note on 09/26/2014, she advised him to stop his aspirin since he was on Elavil was in his stay was in 2002. No need for dual anti-thrombotic stroke with a stroke standpoint.  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 Erlinda Hong and Dr. Donnetta Hutching in the office.  Paroxysmal Atrial Fibrillation  Home anticoagulation:  Eliquis (apixaban) daily continued in the hospital  Continue eliquis at discharge    Hypertension  Stable  Permissive hypertension (OK if < 220/120) but gradually  normalize in 5-7 days  Hyperlipidemia  Home meds:  crestor 40, resumed in hospital  LDL 65, goal < 70  Continue statin at discharge  Other Stroke Risk Factors  Advanced age  Former Cigarette smoker, quit smoking 37 years ago   ETOH use  Peripheral vascular occlusive disease with stable bilateral claudication  Coronary artery disease - stable with no angina, history of 2 stents in 2002   Hospital day # [place] for Pager information 02/23/2015 2:02 PM  I have personally examined this patient, reviewed notes, independently viewed imaging studies, participated in medical decision making and plan of care. I have made any additions or clarifications  directly to the above note. Agree with note above. Patient has presented with transient right-sided weakness to do embolic left MCA branch infarcts from atrial fibrillation despite being on anticoagulation with eliquis. He remains at risk for neurological worsening, recurrent stroke, TIA. I had a long discussion with the patient with regards to available medication options for atrial fibrillation and informed him that there is no present data comparing then several new anticoagulants to each other and hence switching eliquis to Xarelto or Pradaxa is of unproven benefit. He expressed understanding and wants to stay on eliquis. He will also stay on aspirin given his history of coronary artery disease.  Antony Contras, MD Medical Director [facility] Pager: [phone] 02/23/2015 4:31 PM    To contact Stroke Continuity provider, please refer to [email or web address] After hours, contact General Neurology

## 2015-02-23 NOTE — Discharge Summary (Signed)
Physician Discharge Summary  [name] [id number] DOB: 1940/10/27 DOA: 02/21/2015  PCP: Henrine Screws, MD  Admit date: 02/21/2015 Discharge date: 02/23/2015  Time spent: 35 minutes  Recommendations for Outpatient Follow-up:  1. Patient was referred to Dr Early to follow up on Carotid Artery Stenosis (Carotid Doppler showing 40-59% right ICA stenosis and 60-79% ICA stenosis) 2. He was discharged on ASA 81 mg PO q daily and Eliquis   Discharge Diagnoses:  Active Problems:   Asymptomatic carotid artery stenosis   Peripheral vascular disease, unspecified (HCC)   Benign hypertensive heart disease without heart failure   Coronary atherosclerosis of native coronary artery   Paroxysmal atrial fibrillation (HCC)   Stroke (HCC)   CVA (cerebral infarction)   Hyperlipemia   Acute CVA (cerebrovascular accident) [facility])   Discharge Condition: Stable/improved  Diet recommendation: Heart healthy diet  Filed Weights   02/22/15 0229  Weight: 85.276 kg (188 lb)    History of present illness:  [name] is a 75 y.o. male with a history of Paroxysmal Atrial Fibrillation on Eliquis Rx, Bilateral Carotid artery Stenosis (Carotid US on 03/2014), CAD, HTN, Hyperlipidemia, and PVD who presents to the ED with complaints of sudden onset of Right upper Extremity weakness at 10 PM. He rports that he was getting ready to go to bed, and his arm went numb and his arm was not working at all. He told his wife something was wrong and she called EMS. His wife had noticed that he had slurring of his speech. In the ED, a code stroke was called, and he was seen by Neurology and a Ct scan of the Head was performed and was negative for acute findings. An MRI/MRA was ordered and is pending at this time. His symptoms began to improve while in the ED. His NIHSS score was 2.   Hospital Course:  [name] is a pleasant 75 year old gentleman with a past medical history of paroxysmal atrial  fibrillation on chronic anticoagulation with Eliquis, status post bilateral carotid artery stenosis, coronary artery disease, admitted to the medicine service on 02/22/2015 when he presented with right upper extremity weakness or associate with slurred speech. Symptoms happening at home as he was getting ready for bed. He was further worked up with MRI of brain that revealed multiple small acute infarcts involving the left MCA territory consistent with embolic phenomena. Patient was seen and evaluated by neurology. He was continued on anticoagulation with Eliquis with aspirin dose being increased from 81-325 mg by mouth daily. Further workup included carotid Dopplers that revealed 40-59% right ICA stenosis and 60-79% left ICA stenosis. Transthoracic echocardiogram showed preserved ejection fraction of 60-65% without wall motion abnormalities and grade 1 diastolic dysfunction. Neurology recommended discharging on Eliquis and aspirin 81 mg by mouth daily. Neurology also recommended he follow up with his vascular surgeon Dr Donnetta Hutching to see if he is candidate for vascular intervention regarding carotid artery stenosis. His neurological deficits resolved by day of discharge. Given clinical stability he was discharged with home in stable condition on 02/23/2015.  Procedures:  Transthoracic echocardiogram performed on 02/23/2015 impression: - Left ventricle: The cavity size was normal. Wall thickness was increased in a pattern of moderate LVH. Systolic function was normal. The estimated ejection fraction was in the range of 60% to 65%. Wall motion was normal; there were no regional wall motion abnormalities. Doppler parameters are consistent with abnormal left ventricular relaxation (grade 1 diastolic dysfunction). The E/e&' ratio is between 8-15, suggesting indeteminate LV filling pressure. -  Aortic valve: Trileaflet; mildly calcified leaflets. There was no stenosis. There was no significant  regurgitation. - Mitral valve: Moderate posterior calcified annulus. Mildly thickened leaflets . - Left atrium: Severely dilated at 58 ml/m2. - Right ventricle: The cavity size was mildly dilated. Systolic function was normal. - Right atrium: The atrium was mildly dilated. - Atrial septum: Aneurysmal IAS - PFO cannot be excluded. - Tricuspid valve: There was mild regurgitation. - Pulmonary arteries: PA peak pressure: 26 mm Hg (S). - Inferior vena cava: The vessel was normal in size. The respirophasic diameter changes were in the normal range (= 50%), consistent with normal central venous pressure.  Carotid Dopplers performed on 02/23/2015 impression: Preliminary report: There is 40-59% right ICA stenosis and 60-79% left ICA stenosis. There has been an increase in velocities, bilaterally, since study done 03/18/14. Vertebral artery flow is antegrade.   Consultations:  Neurology  Discharge Exam: Filed Vitals:   02/23/15 0932 02/23/15 1349  BP: 138/80 118/61  Pulse: 84 61  Temp: 97.8 F (36.6 C) 98.2 F (36.8 C)  Resp: 20 20    General: Patient is awake and alert, no acute distress  Cardiovascular: Regular rate and rhythm normal S1-S2  Respiratory: Normal respiratory effort lungs are clear  Abdomen: Soft nontender nondistended  Musculoskeletal: No edema  Neurological: He has 5/5 muscle strength to his right upper extremity, finger to nose on right upper extremity is intact, left upper extremity and bilateral extremity strength is intact. Sensation intact, having 2+ bilateral deep tendon reflexes. Overall there is improvement to his neurologic examination compared to yesterday.  Discharge Instructions   Discharge Instructions    Call MD for:  difficulty breathing, headache or visual disturbances    Complete by:  As directed      Call MD for:  extreme fatigue    Complete by:  As directed      Call MD for:  hives    Complete by:  As directed      Call MD for:   persistant dizziness or light-headedness    Complete by:  As directed      Call MD for:  persistant nausea and vomiting    Complete by:  As directed      Call MD for:  redness, tenderness, or signs of infection (pain, swelling, redness, odor or green/yellow discharge around incision site)    Complete by:  As directed      Call MD for:  severe uncontrolled pain    Complete by:  As directed      Call MD for:  temperature >100.4    Complete by:  As directed      Call MD for:    Complete by:  As directed      Diet - low sodium heart healthy    Complete by:  As directed      Increase activity slowly    Complete by:  As directed           Current Discharge Medication List    CONTINUE these medications which have NOT CHANGED   Details  apixaban (ELIQUIS) 5 MG TABS tablet Take 1 tablet (5 mg total) by mouth 2 (two) times daily. Qty: 60 tablet, Refills: 6    aspirin EC 81 MG tablet Take 81 mg by mouth daily.    b complex vitamins tablet Take 1 tablet by mouth daily.      calcium carbonate (OS-CAL) 600 MG TABS Take 600 mg by mouth daily.  Cholecalciferol 1000 UNITS capsule Take 1,000 Units by mouth daily.    Coenzyme Q10 (CO Q 10) 100 MG CAPS Take 1 capsule by mouth daily.    diltiazem (CARDIZEM LA) 300 MG 24 hr tablet Take 300 mg by mouth 4 (four) times daily as needed (tachyarrhythmia).     EPINEPHrine (EPIPEN) 0.3 mg/0.3 mL SOAJ injection Inject 0.3 mg into the muscle daily as needed (allergic reaction).     finasteride (PROSCAR) 5 MG tablet Take 5 mg by mouth daily.    hydrochlorothiazide (HYDRODIURIL) 25 MG tablet Take 25 mg by mouth daily.      losartan (COZAAR) 100 MG tablet Take 100 mg by mouth daily.    Magnesium Hydroxide (MAGNESIA PO) Take 1,000 mg by mouth daily.     Multiple Vitamin (MULTIVITAMIN) tablet Take 1 tablet by mouth daily.      Omega-3 Fatty Acids (FISH OIL PO) Take 1,200 mg by mouth daily.    rosuvastatin (CRESTOR) 40 MG tablet Take 1 tablet (40  mg total) by mouth every evening. Qty: 90 tablet, Refills: 3    Tamsulosin HCl (FLOMAX) 0.4 MG CAPS Take 0.4 mg by mouth daily.      vitamin C (ASCORBIC ACID) 500 MG tablet Take 2,000 mg by mouth daily.       Allergies  Allergen Reactions  . Wasp Venom Shortness Of Breath and Swelling   Follow-up Information    Follow up with Curt Jews, MD In 1 week.   Specialties:  Vascular Surgery, Cardiology   Contact information:   [address] [place] [address] [phone]       Follow up with Xu,Jindong, MD In 2 weeks.   Specialty:  Neurology   Contact information:   [address] [place] [address] [phone]       Follow up with GATES,ROBERT NEVILL, MD In 2 weeks.   Specialty:  Internal Medicine   Contact information:   [address] [place] [address] [phone]        The results of significant diagnostics from this hospitalization (including imaging, microbiology, ancillary and laboratory) are listed below for reference.    Significant Diagnostic Studies: Ct Head Wo Contrast  02/21/2015  CLINICAL DATA:  Code stroke for right-sided weakness and resolving slurred speech. EXAM: CT HEAD WITHOUT CONTRAST TECHNIQUE: Contiguous axial images were obtained from the base of the skull through the vertex without intravenous contrast. COMPARISON:  None. FINDINGS: Skull and Sinuses:Negative for fracture or destructive process. The visualized mastoids, middle ears, and imaged paranasal sinuses are clear. Visualized orbits: Sequela of right retinal detachment surgery, including scleral band. Brain: No evidence of acute infarction, hemorrhage, hydrocephalus, or mass lesion/mass effect. ASPECTS is 10. No abnormal vessel hyperdensity. Intracranial calcified atherosclerosis. Mild small vessel ischemic change for age. Generalized cerebral volume loss. Critical Value/emergent results were called by telephone at the time of interpretation on 02/21/2015 at  11:12 pm to Dr. Nicole Kindred , who verbally acknowledged these results. IMPRESSION: No acute hemorrhage or visible infarct. Electronically Signed   By: Monte Fantasia M.D.   On: 02/21/2015 23:14   Marcus Angiogram Head Wo Contrast  02/22/2015  CLINICAL DATA:  Acute onset RIGHT upper extremity weakness at 10 p.m., and slurred speech. History of atrial fibrillation on Eliquis, carotid artery stenosis, hypertension. EXAM: MRI HEAD WITHOUT CONTRAST MRA HEAD WITHOUT CONTRAST TECHNIQUE: Multiplanar, multiecho pulse sequences of the brain and surrounding structures were obtained without intravenous contrast. Angiographic images of the head were obtained  using MRA technique without contrast. COMPARISON:  CT head February 21, 2015 FINDINGS: MRI HEAD FINDINGS Multiple subcentimeter foci of reduced diffusion LEFT frontal and parietal cortex, limited assessment on ADC map due to small size The ventricles and sulci are normal for patient's age. Patchy supratentorial white matter FLAIR T2 hyperintensities. No abnormal parenchymal signal, mass lesions, mass effect. No susceptibility artifact to suggest hemorrhage. No abnormal extra-axial fluid collections. No extra-axial masses though, contrast enhanced sequences would be more sensitive. Normal major intracranial vascular flow voids seen at the skull base. Probable LEFT parietal developmental venous anomaly. Status post RIGHT ocular globe scleral banding. No abnormal sellar expansion. No suspicious calvarial bone marrow signal. Craniocervical junction maintained. Visualized paranasal sinuses and mastoid air cells are well-aerated. 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 MRA HEAD FINDINGS Anterior circulation: Normal flow related enhancement of the included cervical, petrous, cavernous and supraclinoid internal carotid arteries. Patent anterior communicating artery. Normal flow related enhancement of the anterior and middle cerebral arteries, including distal segments.  Supernumerary anterior cerebral artery arising from LEFT A1-2 junction. No large vessel occlusion, high-grade stenosis, abnormal luminal irregularity, aneurysm. Posterior circulation: Codominant vertebral artery's. Basilar artery is patent, with normal flow related enhancement of the main branch vessels. Normal flow related enhancement of the posterior cerebral arteries. No large vessel occlusion, high-grade stenosis, abnormal luminal irregularity, aneurysm. IMPRESSION: MRI HEAD: Multiple small acute infarcts LEFT MCA territory most consistent with embolic phenomena. Involutional changes. Mild to moderate chronic small vessel ischemic disease. MRA HEAD: No acute large vessel occlusion or high-grade stenosis. Electronically Signed   By: Elon Alas M.D.   On: 02/22/2015 02:07   Marcus Brain Wo Contrast  02/22/2015  CLINICAL DATA:  Acute onset RIGHT upper extremity weakness at 10 p.m., and slurred speech. History of atrial fibrillation on Eliquis, carotid artery stenosis, hypertension. EXAM: MRI HEAD WITHOUT CONTRAST MRA HEAD WITHOUT CONTRAST TECHNIQUE: Multiplanar, multiecho pulse sequences of the brain and surrounding structures were obtained without intravenous contrast. Angiographic images of the head were obtained using MRA technique without contrast. COMPARISON:  CT head February 21, 2015 FINDINGS: MRI HEAD FINDINGS Multiple subcentimeter foci of reduced diffusion LEFT frontal and parietal cortex, limited assessment on ADC map due to small size The ventricles and sulci are normal for patient's age. Patchy supratentorial white matter FLAIR T2 hyperintensities. No abnormal parenchymal signal, mass lesions, mass effect. No susceptibility artifact to suggest hemorrhage. No abnormal extra-axial fluid collections. No extra-axial masses though, contrast enhanced sequences would be more sensitive. Normal major intracranial vascular flow voids seen at the skull base. Probable LEFT parietal developmental venous  anomaly. Status post RIGHT ocular globe scleral banding. No abnormal sellar expansion. No suspicious calvarial bone marrow signal. Craniocervical junction maintained. Visualized paranasal sinuses and mastoid air cells are well-aerated. 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 MRA HEAD FINDINGS Anterior circulation: Normal flow related enhancement of the included cervical, petrous, cavernous and supraclinoid internal carotid arteries. Patent anterior communicating artery. Normal flow related enhancement of the anterior and middle cerebral arteries, including distal segments. Supernumerary anterior cerebral artery arising from LEFT A1-2 junction. No large vessel occlusion, high-grade stenosis, abnormal luminal irregularity, aneurysm. Posterior circulation: Codominant vertebral artery's. Basilar artery is patent, with normal flow related enhancement of the main branch vessels. Normal flow related enhancement of the posterior cerebral arteries. No large vessel occlusion, high-grade stenosis, abnormal luminal irregularity, aneurysm. IMPRESSION: MRI HEAD: Multiple small acute infarcts LEFT MCA territory most consistent with embolic phenomena. Involutional changes. Mild to moderate chronic small vessel  ischemic disease. MRA HEAD: No acute large vessel occlusion or high-grade stenosis. Electronically Signed   By: Elon Alas M.D.   On: 02/22/2015 02:07    Microbiology: No results found for this or any previous visit (from the past 240 hour(s)).   Labs: Basic Metabolic Panel:  Recent Labs Lab 02/21/15 2257 02/21/15 2300 02/22/15 0537 02/23/15 0804  NA 138 139 138 134*  K 3.6 3.5 3.8 3.9  CL 99* 97* 101 97*  CO2 27  --  29 27  GLUCOSE 114* 110* 116* 111*  BUN 21* 23* 20 13  CREATININE 1.31* 1.30* 1.01 0.95  CALCIUM 9.5  --  9.0 9.4   Liver Function Tests:  Recent Labs Lab 02/21/15 2257  AST 29  ALT 19  ALKPHOS 34*  BILITOT 0.5  PROT 6.6  ALBUMIN 3.7   No results for  input(s): LIPASE, AMYLASE in the last 168 hours. No results for input(s): AMMONIA in the last 168 hours. CBC:  Recent Labs Lab 02/21/15 2257 02/21/15 2300 02/22/15 0537 02/23/15 0804  WBC 8.6  --  7.0 7.3  NEUTROABS 5.8  --   --   --   HGB 12.7* 13.6 12.5* 13.6  HCT 37.0* 40.0 36.0* 39.3  MCV 99.2  --  98.6 99.0  PLT 173  --  175 194   Cardiac Enzymes: No results for input(s): CKTOTAL, CKMB, CKMBINDEX, TROPONINI in the last 168 hours. BNP: BNP (last 3 results)  Recent Labs  06/10/14 1723  BNP 132.7*    ProBNP (last 3 results) No results for input(s): PROBNP in the last 8760 hours.  CBG:  Recent Labs Lab 02/21/15 2310  GLUCAP 114*       Signed:  Kelvin Cellar MD.  [facility] 02/23/2015, 4:41 PM

## 2015-02-23 NOTE — Progress Notes (Signed)
  Echocardiogram 2D Echocardiogram has been performed.  Jennette Dubin 02/23/2015, 9:05 AM

## 2015-02-23 NOTE — Progress Notes (Signed)
VASCULAR LAB PRELIMINARY  PRELIMINARY  PRELIMINARY  PRELIMINARY  Carotid duplex completed.    Preliminary report:  There is 40-59% right ICA stenosis and 60-79% left ICA stenosis.  There has been an increase in velocities, bilaterally, since study done 03/18/14.  Vertebral artery flow is antegrade.   Madysyn Hanken, RVT 02/23/2015, 8:48 AM

## 2015-02-23 NOTE — Evaluation (Signed)
Physical Therapy Evaluation and Discharge Patient Details Name: [name] MRN: [id number] DOB: 02-08-1940 Today's Date: 02/23/2015   History of Present Illness  Pt is a 75 y/o male who presents with RUE weakness. Imaging revealed multiple small acute infarcts in the L MCA territory.   Clinical Impression  Patient evaluated by Physical Therapy with no further acute PT needs identified. All education has been completed and the patient has no further questions. At the time of PT eval pt was able to perform transfers and ambulation with independence. Pt demonstrated 5/5 strength bilaterally in upper and lower extremities and denies sensation changes. Pt reports he is back to baseline. See below for any follow-up Physical Therapy or equipment needs. PT is signing off. Thank you for this referral.     Follow Up Recommendations No PT follow up    Equipment Recommendations  None recommended by PT    Recommendations for Other Services       Precautions / Restrictions Precautions Precautions: None Restrictions Weight Bearing Restrictions: No      Mobility  Bed Mobility Overal bed mobility: Independent                Transfers Overall transfer level: Independent Equipment used: None                Ambulation/Gait Ambulation/Gait assistance: Independent Ambulation Distance (Feet): 700 Feet Assistive device: None Gait Pattern/deviations: WFL(Within Functional Limits)   Gait velocity interpretation: at or above normal speed for age/gender    Stairs Stairs: Yes Stairs assistance: Modified independent (Device/Increase time) Stair Management: One rail Left;Alternating pattern;Forwards Number of Stairs: 12 General stair comments: 1 minor instance where pt's shoe caught the stair, however pt was able to recover independently. No assistance required for stair negotiation.   Wheelchair Mobility    Modified Rankin (Stroke Patients Only) Modified Rankin (Stroke  Patients Only) Pre-Morbid Rankin Score: No symptoms Modified Rankin: No symptoms     Balance Overall balance assessment: Modified Independent                                           Pertinent Vitals/Pain Pain Assessment: No/denies pain    Home Living Family/patient expects to be discharged to:: Private residence Living Arrangements: Spouse/significant other Available Help at Discharge: Family;Available 24 hours/day Type of Home: House Home Access: Stairs to enter Entrance Stairs-Rails: Right;Left;Can reach both Entrance Stairs-Number of Steps: 3 Home Layout: Two level;Able to live on main level with bedroom/bathroom Home Equipment: Shower seat;Grab bars - tub/shower;Walker - 2 wheels;Bedside commode;Shower seat - built in      Prior Function Level of Independence: Independent               Hand Dominance   Dominant Hand: Right    Extremity/Trunk Assessment   Upper Extremity Assessment: Overall WFL for tasks assessed           Lower Extremity Assessment: Overall WFL for tasks assessed      Cervical / Trunk Assessment: Normal  Communication   Communication: No difficulties  Cognition Arousal/Alertness: Awake/alert Behavior During Therapy: WFL for tasks assessed/performed Overall Cognitive Status: Within Functional Limits for tasks assessed                      General Comments      Exercises        Assessment/Plan  PT Assessment Patent does not need any further PT services  PT Diagnosis Difficulty walking   PT Problem List    PT Treatment Interventions     PT Goals (Current goals can be found in the Care Plan section) Acute Rehab PT Goals PT Goal Formulation: All assessment and education complete, DC therapy    Frequency     Barriers to discharge        Co-evaluation               End of Session   Activity Tolerance: Patient tolerated treatment well Patient left: Other (comment) (Standing at sink  about to wash up) Nurse Communication: Mobility status         Time: [id number] PT Time Calculation (min) (ACUTE ONLY): 16 min   Charges:   PT Evaluation $PT Eval Low Complexity: 1 Procedure     PT G Codes:        Rolinda Roan 08-Mar-2015, 9:50 AM   Rolinda Roan, PT, DPT Acute Rehabilitation Services Pager: [phone]

## 2015-02-24 ENCOUNTER — Telehealth: Payer: Self-pay | Admitting: Vascular Surgery

## 2015-02-24 DIAGNOSIS — H25012 Cortical age-related cataract, left eye: Secondary | ICD-10-CM | POA: Diagnosis not present

## 2015-02-24 DIAGNOSIS — H2512 Age-related nuclear cataract, left eye: Secondary | ICD-10-CM | POA: Diagnosis not present

## 2015-02-24 DIAGNOSIS — H2513 Age-related nuclear cataract, bilateral: Secondary | ICD-10-CM | POA: Diagnosis not present

## 2015-02-24 DIAGNOSIS — H2511 Age-related nuclear cataract, right eye: Secondary | ICD-10-CM | POA: Diagnosis not present

## 2015-02-24 DIAGNOSIS — H02839 Dermatochalasis of unspecified eye, unspecified eyelid: Secondary | ICD-10-CM | POA: Diagnosis not present

## 2015-02-24 NOTE — Telephone Encounter (Signed)
[name] He is concern about about arteries especially suffering another stroke this past weekend. [name] Pt called at 410 pm on 02/24/2015.Marcus KitchenMarland KitchenAD  Please call his cell phone  [phone].

## 2015-02-25 ENCOUNTER — Ambulatory Visit (INDEPENDENT_AMBULATORY_CARE_PROVIDER_SITE_OTHER): Payer: PPO | Admitting: Neurology

## 2015-02-25 ENCOUNTER — Telehealth: Payer: Self-pay | Admitting: Neurology

## 2015-02-25 ENCOUNTER — Encounter: Payer: Self-pay | Admitting: Neurology

## 2015-02-25 VITALS — BP 114/67 | HR 66 | Ht 74.0 in | Wt 195.0 lb

## 2015-02-25 DIAGNOSIS — I6523 Occlusion and stenosis of bilateral carotid arteries: Secondary | ICD-10-CM | POA: Diagnosis not present

## 2015-02-25 DIAGNOSIS — E785 Hyperlipidemia, unspecified: Secondary | ICD-10-CM | POA: Diagnosis not present

## 2015-02-25 DIAGNOSIS — I6529 Occlusion and stenosis of unspecified carotid artery: Secondary | ICD-10-CM | POA: Insufficient documentation

## 2015-02-25 DIAGNOSIS — I10 Essential (primary) hypertension: Secondary | ICD-10-CM

## 2015-02-25 DIAGNOSIS — G43109 Migraine with aura, not intractable, without status migrainosus: Secondary | ICD-10-CM | POA: Diagnosis not present

## 2015-02-25 DIAGNOSIS — I63232 Cerebral infarction due to unspecified occlusion or stenosis of left carotid arteries: Secondary | ICD-10-CM

## 2015-02-25 NOTE — Telephone Encounter (Signed)
Message sent to Dr.Xu. 

## 2015-02-25 NOTE — Telephone Encounter (Signed)
Patient is calling. He was seen in our office today and wanted to let Dr. Erlinda Hong know that he only takes diltiazem (CARDIZEM LA) 300 MG 24 hr tablet as needed.

## 2015-02-25 NOTE — Telephone Encounter (Signed)
Attempted to contact the pt. per phone.  Was advised per pt's wife he is at another appt., and to call back at approx. 11:30 AM.

## 2015-02-25 NOTE — Patient Instructions (Addendum)
-   continue ASA 81mg  and eliquis 5mg  twice a day and crestor for stroke prevention - follow up with Dr. Donnetta Hutching ASAP to evaluate left carotid stenosis in the setting of recent left brain stroke - follow up with Dr. Radford Pax to clarify the use of cardizem - recommend to hold off cataract surgery until clear instruction from Dr. Donnetta Hutching - Follow up with your primary care physician for stroke risk factor modification. Recommend maintain blood pressure goal <130/80, diabetes with hemoglobin A1c goal below 6.5% and lipids with LDL cholesterol goal below 70 mg/dL.  - check BP at home and goal 120-150 at this time.  - follow up 3 months.

## 2015-02-25 NOTE — Telephone Encounter (Signed)
Called pt. At 11:50 AM to follow up to his phone call to our office on 2/7.  Was advised by wife that he was at our office right now to try to talk to someone and get appt. With Dr. Donnetta Hutching scheduled.  Discussed with Check-In Receptionist.  Reported the pt. had been in and requested to make an appt. With Dr. Donnetta Hutching; reported the pt. stated "he had a stroke recently, and the CT was terrible."  Receptionist reported an appt. Was given to see Dr. Donnetta Hutching on 2/14.

## 2015-02-25 NOTE — Telephone Encounter (Signed)
Noted. Thank you.  Rosalin Hawking, MD PhD Stroke Neurology 02/25/2015 2:02 PM

## 2015-02-25 NOTE — Progress Notes (Signed)
NEUROLOGY CLINIC FOLLOW UP PATIENT NOTE  NAME: [name] DOB: 07/17/1940  History summary: [name] is a 75 y.o. male with PMH of HTN, HLD, CAD s/p stent, and carotid stenosis first seen on 09/10/13 for visual disturbance.  He stated that for the last 2 years he started to have the episodes of seeing circles with weaving lines from either middle of eye field or side of eye field and growing to whole visual field. Looks like seeing from the bottom of glass, from prisms. It occurs in both eyes, as he closes one eye and the images are still there. This will last about 8-37min and is gone. It does not associate with any headache, N/V, dizziness, confusion, LOC or weakness / numbness. For the last 2 years, it happened 3-4 times. He denies any history of migraine, or any family history of migraine. These episodes also were not followed by any headache. He denies any new medication or new food or certain food supplement was added for the last 2 years.    He was recently seen by Dr. Sherren Mocha for carotid steonsis and found to have 60% left ICA stenosis and 35% right ICA stenosis. There is an questionable aphasia or confusion episode associated with one of the visual disturbance episode. He was working in the yard when the visual episode happened, he was kind of dehydrated and he went inside and reading friend's message regarding a golf course, he was confused about the name because there are many similar names of the golf course. He stated that he was not aphasia or confused, instead, he tested himself with all his doctors names and he was fine.   He has hx of HTN, HLD, CAD and he is on medications. He is also on baby ASA daily. He generally has good health and very active and loves golf. He quit smoking long time ago and mild drinking and no illicit drugs.  Interval history: During the interval time, he developed tachypalpitations while doing yard work on 06/10/14. He went to the [facility] ED  but had converted to sinus rhythm by the time he presented. Stress test was unrevealing. He had 30 day event monitor which revealed afib (corresponded to symptoms) on 06/30/14. He was placed on eliquis.  On 02/21/15 he was admitted to [facility] for slurred speech and right upper extremity weakness, resolved over night. 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 MRA unremarkable but CUS showed left 60-79% and right 40-59% stenosis. CTA poor quality due to bolus timing. His stroke thought to favor left ICA high grade stenosis instead of pAfib due to left ICA stenosis and on eliquis. Pt was put on 81mg  ASA on top of eliquis. He was discharged to follow up with Dr. Donnetta Hutching ASAP.   Today in clinic, he is doing well, still try to contact Dr. Luther Parody office for appointment. BP 114/67. No recurrent stroke like symptoms.    Past Medical History  Diagnosis Date  . Hypertension   . Nocturia   . Hyperlipidemia   . Arthritis     HANDS AND FEET  . Cataract immature BILATERAL   . Hydrocele, left   . History of AAA (abdominal aortic aneurysm) repair 2000    W/ AORTOVIFEMORAL BYPASS  . Peripheral vascular disease ([place]) FOLLOWED BY DR EARLY-- VISIT NOTE 01-05-10 AND CAROTID / VASCULAR DOPPLER  RESULTS W/ CHART    ACTIVE WALKING PROGRAM  . Status post primary angioplasty with coronary stent 2002--  X2  BM STENTS  . 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 Retinal detachment   . Increased intraocular pressure   . History of inguinal herniorrhaphy   . History of diverticulosis     Noted on Colonoscopy  . Coronary artery disease CARDIOLOGIST- DR Tressia Miners TURNER-- LAST VISIT NOV  2012-- WILL REQUEST NOTE AND STRESS TEST    s/p PCI 2000  . Asymptomatic carotid artery stenosis BILATERAL     PER DOPPLER  01-05-10   RICA  1 - [id number]   LICA  40 - [id number] - followed by Dr. Donnetta Hutching  . Diverticulosis   . H/O heart artery stent 02/19/00  . Paroxysmal atrial fibrillation ([place]) 06/30/14    chad2vasc score is at least  4  . Stroke [facility])    Past Surgical History  Procedure Laterality Date  . Coronary angioplasty  2002    PTCA  W/ X2 BM STENTS--   PROXIMA LAD  &  LEFT CIRCUMFLEX TO FIRST OBTUSE MARGINAL BRANCH  . Aaa repair w/ aortobifemoral bypass  OCT  24401  . Pulley release -- right 5th finger  2009  . Exploratory laparotomy w/ enterolysis for partial sbo  09-23-2008  . Retinal detachment surgery  2004    BILATERAL  . Abdominal hernia repair  X4  (LAST ONE 1990'S)    inguinal herniorrhaphy  . Hydrocele excision  01/24/2011    Procedure: HYDROCELECTOMY ADULT;  Surgeon: Bernestine Amass, MD;  Location: [facility];  Service: Urology;  Laterality: Left;  . Tooth extraction  Dec 2013  . Carpal tunnel release Right Oct. 2013    Hand  . Ingrown toe nail Right Jan. 2015    Right Great Toe nail  . Intest blockage  09/22/08  . Heart stents  02/19/00   Family History  Problem Relation Age of Onset  . Heart disease Mother     AAA  Before age 92  . Cancer Mother 15    Colon cancer  . Hyperlipidemia Mother   . Heart failure Father    Current Outpatient Prescriptions  Medication Sig Dispense Refill  . apixaban (ELIQUIS) 5 MG TABS tablet Take 1 tablet (5 mg total) by mouth 2 (two) times daily. 60 tablet 6  . aspirin EC 81 MG tablet Take 81 mg by mouth daily.    [name] b complex vitamins tablet Take 1 tablet by mouth daily.      . calcium carbonate (OS-CAL) 600 MG TABS Take 600 mg by mouth daily.      . Cholecalciferol 1000 UNITS capsule Take 1,000 Units by mouth daily.    . Coenzyme Q10 (CO Q 10) 100 MG CAPS Take 1 capsule by mouth daily.    [name] diltiazem (CARDIZEM LA) 300 MG 24 hr tablet Take 300 mg by mouth 4 (four) times daily as needed (tachyarrhythmia).     [name] EPINEPHrine (EPIPEN) 0.3 mg/0.3 mL SOAJ injection Inject 0.3 mg into the muscle daily as needed (allergic reaction).     . finasteride (PROSCAR) 5 MG tablet Take 5 mg by mouth daily.    . hydrochlorothiazide (HYDRODIURIL) 25 MG tablet  Take 25 mg by mouth daily.      [name] losartan (COZAAR) 100 MG tablet Take 100 mg by mouth daily.    . Magnesium Hydroxide (MAGNESIA PO) Take 1,000 mg by mouth daily.     . Multiple Vitamin (MULTIVITAMIN) tablet Take 1 tablet by mouth daily.      . Omega-3 Fatty Acids (FISH OIL PO) Take  1,200 mg by mouth daily.    . rosuvastatin (CRESTOR) 40 MG tablet Take 1 tablet (40 mg total) by mouth every evening. 90 tablet 3  . Tamsulosin HCl (FLOMAX) 0.4 MG CAPS Take 0.4 mg by mouth daily.      . vitamin C (ASCORBIC ACID) 500 MG tablet Take 2,000 mg by mouth daily.     No current facility-administered medications for this visit.   Allergies  Allergen Reactions  . Wasp Venom Shortness Of Breath and Swelling   Social History   Social History  . Marital Status: Married    Spouse Name: N/A  . Number of Children: N/A  . Years of Education: N/A   Occupational History  . Not on file.   Social History Main Topics  . Smoking status: Former Smoker -- 30 years    Types: Cigarettes    Quit date: 01/18/1978  . Smokeless tobacco: Never Used  . Alcohol Use: 13.2 oz/week    21 Standard drinks or equivalent, 1 Shots of liquor per week     Comment: 2 oz of vodka per day  . Drug Use: No  . Sexual Activity: Not on file   Other Topics Concern  . Not on file   Social History Narrative   Pt lives in [place] with spouse.  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    Review of Systems Full 14 system review of systems performed and notable only for those listed, all others are neg:  Easy bruising, ringing in the ears  Physical Exam  Filed Vitals:   02/25/15 1017  BP: 114/67  Pulse: 66    General - Well nourished, well developed, in no apparent distress.  Ophthalmologic - Sharp disc margins OU.  Cardiovascular - Regular rate and rhythm with no murmur. Carotid pulses were 2+ without bruits .   Neck - supple, no nuchal rigidity .  Mental Status -  Level of arousal and  orientation to time, place, and person were intact. Language including expression, naming, repetition, comprehension, reading, and writing was assessed and found intact. Attention span and concentration were normal. Recent and remote memory were intact. Fund of Knowledge was assessed and was intact.  Cranial Nerves II - XII - II - Visual field intact OU. III, IV, VI - Extraocular movements intact. V - Facial sensation intact bilaterally. VII - Facial movement intact bilaterally. VIII - Hearing & vestibular intact bilaterally. X - Palate elevates symmetrically. XI - Chin turning & shoulder shrug intact bilaterally. XII - Tongue protrusion intact.  Motor Strength - The patient's strength was normal in all extremities and pronator drift was absent.  Bulk was normal and fasciculations were absent.   Motor Tone - Muscle tone was assessed at the neck and appendages and was normal.  Reflexes - The patient's reflexes were normal in all extremities and he had no pathological reflexes.  Sensory - Light touch, temperature/pinprick, vibration and proprioception, and Romberg testing were assessed and were normal.    Coordination - The patient had normal movements in the hands and feet with no ataxia or dysmetria.  Tremor was absent.  Gait and Station - The patient's transfers, posture, gait, station, and turns were observed as normal.   Imaging CTA neck - 09/02/13 Atherosclerotic disease at both carotid bifurcations. 35% stenosis  of the proximal ICA on the right and 60% stenosis of the proximal  ICA on the left.  CTA neck 02/23/15 - 1. A poor contrast bolus is evident. There  is poor opacification of the arterial structures. This severely degrades the sensitivity of this exam. 2. 60- 70% stenosis of the right carotid bifurcation. 3. Indeterminate, potentially moderate to severe left carotid bifurcations stenosis with dense calcifications. 4. The vertebral arteries are poorly visualized. 5.  Moderate spondylosis of the cervical spine.  CUS - Bilateral: mild soft plaque CCA. Moderate to severe mixed plaque origin ICA. 40-59% right ICA stenosis, upper end of scale. 60-79% left ICA stenosis, mid range of scale. This is an increase in velocities since study done 03/18/14. Bilateral vertebral artery flow is antegrade.  MRI and MRA brain - MRI HEAD: Multiple small acute infarcts LEFT MCA territory most consistent with embolic phenomena. Involutional changes. Mild to moderate chronic small vessel ischemic disease. MRA HEAD: No acute large vessel occlusion or high-grade stenosis.  Component     Latest Ref Rng 02/22/2015  Cholesterol     0 - 200 mg/dL 147  Triglycerides     <150 mg/dL 26  HDL Cholesterol     >40 mg/dL 77  Total CHOL/HDL Ratio      1.9  VLDL     0 - 40 mg/dL 5  LDL (calc)     0 - 99 mg/dL 65  Hemoglobin A1C     4.8 - 5.6 % 5.6  Mean Plasma Glucose      114     Assessment:   In summary, [name] is a 75 y.o. male with PMH of HTN, HLD, CAD s/p stent and carotid stenosis initially presents visual disturbance in 08/2013. By his description, this is typical for migraine visual aura. During the interval time, he was found to have afib and put on eliquis. In 02/2015, he had left MCA territory infarcts, and left ICA 60-79% stenosis on CUS. CTA poor quality, not able to evaluate. His stroke likely due to left ICA high grade stenosis. He was discharged with ASA and eliquis, and follow up with Dr. Donnetta Hutching. BP goal 120-150 prior to intervention.  Plan: - continue ASA 81mg  and eliquis 5mg  twice a day and crestor for stroke prevention - follow up with Dr. Donnetta Hutching ASAP to evaluate left carotid stenosis in the setting of recent left brain stroke - recommend to hold off cataract surgery until clear instruction from Dr. Donnetta Hutching - Follow up with your primary care physician for stroke risk factor modification. Recommend maintain blood pressure goal <130/80, diabetes with  hemoglobin A1c goal below 6.5% and lipids with LDL cholesterol goal below 70 mg/dL.  - check BP at home and goal 120-150 at this time.  - follow up 3 months.  I spent more than 25 minutes of face to face time with the patient. Greater than 50% of time was spent in counseling and coordination of care. We discussed about benefit of early CEA, stroke prevention and check BP at home.Rosalin Hawking, MD PhD [facility] [address], [place], [place] [address] [phone]

## 2015-03-03 ENCOUNTER — Encounter: Payer: Self-pay | Admitting: Vascular Surgery

## 2015-03-03 ENCOUNTER — Ambulatory Visit (INDEPENDENT_AMBULATORY_CARE_PROVIDER_SITE_OTHER): Payer: PPO | Admitting: Vascular Surgery

## 2015-03-03 VITALS — BP 131/74 | HR 57 | Temp 97.5°F | Resp 18 | Ht 72.5 in | Wt 193.8 lb

## 2015-03-03 DIAGNOSIS — I6523 Occlusion and stenosis of bilateral carotid arteries: Secondary | ICD-10-CM

## 2015-03-03 DIAGNOSIS — I63512 Cerebral infarction due to unspecified occlusion or stenosis of left middle cerebral artery: Secondary | ICD-10-CM | POA: Diagnosis not present

## 2015-03-03 NOTE — Progress Notes (Signed)
[facility] [place]  Patient name: [name] MRN: [id number] DOB: [birth year] Sex: male  REASON FOR VISIT: Discussion of carotid stenosis and recent left brain TIA.  HPI: [name] is a 75 y.o. male known to me from open aneurysm repair 16 years ago and ongoing follow-up of left carotid stenosis. He has extensive calcification of this lesion and underwent further evaluation with a CT angiogram in 2015. That time his estimated degree of stenosis was approximately 60% and he remained asymptomatic. He recently was admitted to the hospital with an episode of expressive aphasia and right arm weakness. Fortunately resolved within 24 hours time. A carotid duplex showed significant progression in his systolic velocities. When we had seen him in March 2016 his maximal systolic velocity was in the 190s range on the left internal carotid artery. At the recent hospitalization was in the 290s range. He underwent repeat CT angiogram but unfortunately there was poor timing of contrast giving very little information regarding the level of stenosis. This did show extensive calcification in the carotid bifurcation. The fourchette has returned completely to normal. He is in chronic atrial fibrillation and is on oral anticoagulation for this.  Past Medical History  Diagnosis Date  . Hypertension   . Nocturia   . Hyperlipidemia   . Arthritis     HANDS AND FEET  . Cataract immature BILATERAL   . Hydrocele, left   . History of AAA (abdominal aortic aneurysm) repair 2000    W/ AORTOVIFEMORAL BYPASS  . Peripheral vascular disease ([place]) FOLLOWED BY DR Nicholus Chandran-- VISIT NOTE 01-05-10 AND CAROTID / VASCULAR DOPPLER  RESULTS W/ CHART    ACTIVE WALKING PROGRAM  . Status post primary angioplasty with coronary stent 2002--  X2 BM STENTS  . 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 Retinal detachment   . Increased intraocular pressure   . History of inguinal herniorrhaphy   . History of  diverticulosis     Noted on Colonoscopy  . Coronary artery disease CARDIOLOGIST- DR Tressia Miners TURNER-- LAST VISIT NOV  2012-- WILL REQUEST NOTE AND STRESS TEST    s/p PCI 2000  . Asymptomatic carotid artery stenosis BILATERAL     PER DOPPLER  01-05-10   RICA  1 - [id number]   LICA  40 - [id number] - followed by Dr. Donnetta Hutching  . Diverticulosis   . H/O heart artery stent 02/19/00  . Paroxysmal atrial fibrillation ([place]) 06/30/14    chad2vasc score is at least 4  . Stroke [facility])     Family History  Problem Relation Age of Onset  . Heart disease Mother     AAA  Before age 69  . Cancer Mother 10    Colon cancer  . Hyperlipidemia Mother   . Heart failure Father     SOCIAL HISTORY: Social History  Substance Use Topics  . Smoking status: Former Smoker -- 30 years    Types: Cigarettes    Quit date: 01/18/1978  . Smokeless tobacco: Never Used  . Alcohol Use: 13.2 oz/week    21 Standard drinks or equivalent, 1 Shots of liquor per week     Comment: 2 oz of vodka per day    Allergies  Allergen Reactions  . Wasp Venom Shortness Of Breath and Swelling    Current Outpatient Prescriptions  Medication Sig Dispense Refill  . apixaban (ELIQUIS) 5 MG TABS tablet Take 1 tablet (5 mg total) by mouth 2 (two) times daily. 60 tablet 6  .  aspirin EC 81 MG tablet Take 81 mg by mouth daily.    [name] b complex vitamins tablet Take 1 tablet by mouth daily.      . calcium carbonate (OS-CAL) 600 MG TABS Take 600 mg by mouth daily.      . Cholecalciferol 1000 UNITS capsule Take 1,000 Units by mouth daily.    . Coenzyme Q10 (CO Q 10) 100 MG CAPS Take 1 capsule by mouth daily.    [name] diltiazem (CARDIZEM LA) 300 MG 24 hr tablet Take 300 mg by mouth 4 (four) times daily as needed (tachyarrhythmia).     [name] EPINEPHrine (EPIPEN) 0.3 mg/0.3 mL SOAJ injection Inject 0.3 mg into the muscle daily as needed (allergic reaction).     . finasteride (PROSCAR) 5 MG tablet Take 5 mg by mouth daily.    . hydrochlorothiazide (HYDRODIURIL) 25 MG  tablet Take 25 mg by mouth daily.      [name] losartan (COZAAR) 100 MG tablet Take 100 mg by mouth daily.    . Magnesium Hydroxide (MAGNESIA PO) Take 1,000 mg by mouth daily.     . Multiple Vitamin (MULTIVITAMIN) tablet Take 1 tablet by mouth daily.      . Omega-3 Fatty Acids (FISH OIL PO) Take 1,200 mg by mouth daily.    . rosuvastatin (CRESTOR) 40 MG tablet Take 1 tablet (40 mg total) by mouth every evening. 90 tablet 3  . Tamsulosin HCl (FLOMAX) 0.4 MG CAPS Take 0.4 mg by mouth daily.      . vitamin C (ASCORBIC ACID) 500 MG tablet Take 2,000 mg by mouth daily.     No current facility-administered medications for this visit.    REVIEW OF SYSTEMS:  [X]  denotes positive finding, [ ]  denotes negative finding Cardiac  Comments:  Chest pain or chest pressure:    Shortness of breath upon exertion:    Short of breath when lying flat:    Irregular heart rhythm:        Vascular    Pain in calf, thigh, or hip brought on by ambulation:    Pain in feet at night that wakes you up from your sleep:     Blood clot in your veins:    Leg swelling:         Pulmonary    Oxygen at home:    Productive cough:     Wheezing:         Neurologic    Sudden weakness in arms or legs:  x   Sudden numbness in arms or legs:  x   Sudden onset of difficulty speaking or slurred speech: x   Temporary loss of vision in one eye:     Problems with dizziness:         Gastrointestinal    Blood in stool:     Vomited blood:         Genitourinary    Burning when urinating:     Blood in urine:        Psychiatric    Major depression:         Hematologic    Bleeding problems:    Problems with blood clotting too easily:        Skin    Rashes or ulcers:        Constitutional    Fever or chills:      PHYSICAL EXAM: Filed Vitals:   03/03/15 1627  BP: 131/74  Pulse: 57  Temp: 97.5 F (36.4 C)  TempSrc:  Oral  Resp: 18  Height: 6' 0.5" (1.842 m)  Weight: 193 lb 12.8 oz (87.907 kg)  SpO2: 99%     GENERAL: The patient is a well-nourished male, in no acute distress. The vital signs are documented above. CARDIAC: There is a regular rate and rhythm.  VASCULAR: 2+ radial pulses. Carotid arteries without bruits bilaterally PULMONARY: There is good air exchange bilaterally without wheezing or rales. ABDOMEN: Soft and non-tender with normal pitched bowel sounds.  MUSCULOSKELETAL: There are no major deformities or cyanosis. NEUROLOGIC: No focal weakness or paresthesias are detected. SKIN: There are no ulcers or rashes noted. PSYCHIATRIC: The patient has a normal affect.  DATA:  Reviewed his duplex and also his CT angiogram images from several weeks ago and also from 2015.  MEDICAL ISSUES: Brain event with at least greater than 60% stenosis and probably greater than this. Extreme irregularity and calcification throughout his left carotid bifurcation. I suspect that this is his most likely cause of stroke. Have recommended left endarterectomy for reduction of stroke risk. Explained that this would be a one night hospitalization. Also explained the 1-2% risk of stroke with surgery. He understands and wished to proceed as soon as possible. We will stop his anticoagulation several days prior to surgery and continue his aspirin therapy. We'll resume this following surgery. No Follow-up on file.   Curt Jews [facility] [place]: [phone]

## 2015-03-04 ENCOUNTER — Telehealth: Payer: Self-pay | Admitting: Cardiology

## 2015-03-04 ENCOUNTER — Other Ambulatory Visit: Payer: Self-pay

## 2015-03-04 NOTE — Telephone Encounter (Signed)
Request for surgical clearance:  1. What type of surgery is being performed? CArotid Surg  2. When is this surgery scheduled? 03/11/15  3. Are there any medications that need to be held prior to surgery and how long? Eliquis 3 days  4. Name of physician performing surgery? Todd Early  5. What is your office phone and fax number? [phone]   03/04/2015 12:03 PM Phone (Incoming) Vasc/Vein    Phone: [phone]

## 2015-03-04 NOTE — Telephone Encounter (Signed)
Patient cleared from cardiac standpoint for surgery.  He is low risk from cardiac standpoint.  OK to hold Eliquis for surgery.

## 2015-03-04 NOTE — Telephone Encounter (Signed)
Request for surgical clearance:  1. What type of surgery is being performed? CArotid Surg  2. When is this surgery scheduled? 03/11/15  3. Are there any medications that need to be held prior to surgery and how long? Eliquis 3 days  4. Name of physician performing surgery? Todd Early   5. What is your office phone and fax number? [phone] 6.

## 2015-03-05 NOTE — Telephone Encounter (Signed)
Printed and placed in MR "to be faxed" bin.  Forwarded to Dr. Donnetta Hutching.

## 2015-03-09 NOTE — Pre-Procedure Instructions (Signed)
[name]  03/09/2015      [id number] - [place] [address] [place] [address] Phone: [phone] Fax: [phone]    Your procedure is scheduled on Wednesday February 22.  Report to [facility] Admitting at 630 A.M.  Call this number if you have problems the morning of surgery:  [phone]   Remember:  Do not eat food or drink liquids after midnight.  Take these medicines the morning of surgery with A SIP OF WATER diltiazem (cardizem), finasteride (proscar), tamsulosin (flomax)  STOP: ALL Vitamins, Supplements, Effient and Herbal Medications, Fish Oils, Aspirins, NSAIDs (Nonsteroidal Anti-inflammatories such as Ibuprofen, Aleve, or Advil), and Goody's/BC Powders today until after surgery as directed by your physician.   Follow Dr. Theodosia Blender instructions for holding Eliquis for 3 days prior to surgery.   Do not wear jewelry.  Do not wear lotions, powders, or perfumes.  You may wear deodorant.  Do not shave 48 hours prior to surgery.    Do not bring valuables to the hospital.  [facility] is not responsible for any belongings or valuables.  Contacts, dentures or bridgework may not be worn into surgery.  Leave your suitcase in the car.  After surgery it may be brought to your room.  For patients admitted to the hospital, discharge time will be determined by your treatment team.  Patients discharged the day of surgery will not be allowed to drive home.        Preparing for Surgery at [facility]  Before surgery, you can play an important role.  Because skin is not sterile, your skin needs to be as free of germs as possible.  You can reduce the number of germs on your skin by washing with CHG (chlorahexidine gluconate) Soap before surgery.  CHG is an antiseptic cleaner with kills germs and bonds with the skin to continue killing germs even after washing.   Please do not use if you have an allergy to CHG or antibacterial  soaps.  If your skin becomes reddened/irritated stop using the CHG.  Do not shave (including legs and underarms) for at least 48 hours prior to first CHG shower.  It is okay to shave your face.  Please follow these instructions carefully:  1. Shower with CHG Soap the night before surgery and the morning of Surgery. 2. If you choose to wash your hair, wash your hair first as usual with your normal shampoo. 3. After you shampoo, rinse your hair and body thoroughly to remove the Shampoo. 4. Use CHG as you would any other liquid soap. You can apply chg directly to the skin and wash gently with scrungie or a clean washcloth. 5. Apply the CHG Soap to your body ONLY FROM THE NECK DOWN. Do not use on open wounds or open sores. Avoid contact with your eyes, ears, mouth and genitals (private parts). Wash genitals (private parts) with your normal soap. 6. Wash thoroughly, paying special attention to the area where your surgery will be performed. 7. Thoroughly rinse your body with warm water from the neck down. 8. DO NOT shower/wash with your normal soap after using and rinsing off the CHG Soap. 9. Pat yourself dry with a clean towel.  10. Wear clean pajamas.  11. Place clean sheets on your bed the night of your first shower and do not sleep with pets.  Day of Surgery  Do not apply any lotions/deodorants the morning of surgery. Please wear  clean clothes to the hospital/surgery center.   Please read over the following fact sheets that you were given. Pain Booklet, Coughing and Deep Breathing, Blood Transfusion Information, MRSA Information and Surgical Site Infection Prevention

## 2015-03-10 ENCOUNTER — Encounter (HOSPITAL_COMMUNITY): Payer: Self-pay

## 2015-03-10 ENCOUNTER — Encounter (HOSPITAL_COMMUNITY)
Admission: RE | Admit: 2015-03-10 | Discharge: 2015-03-10 | Disposition: A | Discharge status: Home / Self Care | Payer: PPO | Source: Ambulatory Visit | Attending: Vascular Surgery | Admitting: Vascular Surgery

## 2015-03-10 DIAGNOSIS — Z87891 Personal history of nicotine dependence: Secondary | ICD-10-CM | POA: Diagnosis not present

## 2015-03-10 DIAGNOSIS — Z8673 Personal history of transient ischemic attack (TIA), and cerebral infarction without residual deficits: Secondary | ICD-10-CM | POA: Diagnosis not present

## 2015-03-10 DIAGNOSIS — I6522 Occlusion and stenosis of left carotid artery: Secondary | ICD-10-CM | POA: Diagnosis not present

## 2015-03-10 DIAGNOSIS — I2510 Atherosclerotic heart disease of native coronary artery without angina pectoris: Secondary | ICD-10-CM | POA: Diagnosis not present

## 2015-03-10 DIAGNOSIS — Z8249 Family history of ischemic heart disease and other diseases of the circulatory system: Secondary | ICD-10-CM | POA: Diagnosis not present

## 2015-03-10 DIAGNOSIS — I480 Paroxysmal atrial fibrillation: Secondary | ICD-10-CM | POA: Diagnosis not present

## 2015-03-10 DIAGNOSIS — Z79899 Other long term (current) drug therapy: Secondary | ICD-10-CM | POA: Diagnosis not present

## 2015-03-10 DIAGNOSIS — I739 Peripheral vascular disease, unspecified: Secondary | ICD-10-CM | POA: Diagnosis not present

## 2015-03-10 DIAGNOSIS — Z9109 Other allergy status, other than to drugs and biological substances: Secondary | ICD-10-CM | POA: Diagnosis not present

## 2015-03-10 DIAGNOSIS — E785 Hyperlipidemia, unspecified: Secondary | ICD-10-CM | POA: Diagnosis not present

## 2015-03-10 DIAGNOSIS — Z955 Presence of coronary angioplasty implant and graft: Secondary | ICD-10-CM | POA: Diagnosis not present

## 2015-03-10 DIAGNOSIS — I10 Essential (primary) hypertension: Secondary | ICD-10-CM | POA: Diagnosis not present

## 2015-03-10 DIAGNOSIS — M1990 Unspecified osteoarthritis, unspecified site: Secondary | ICD-10-CM | POA: Diagnosis not present

## 2015-03-10 DIAGNOSIS — Z7982 Long term (current) use of aspirin: Secondary | ICD-10-CM | POA: Diagnosis not present

## 2015-03-10 DIAGNOSIS — N400 Enlarged prostate without lower urinary tract symptoms: Secondary | ICD-10-CM | POA: Diagnosis not present

## 2015-03-10 LAB — COMPREHENSIVE METABOLIC PANEL
ALK PHOS: 37 U/L — AB (ref 38–126)
ALT: 24 U/L (ref 17–63)
AST: 34 U/L (ref 15–41)
Albumin: 3.9 g/dL (ref 3.5–5.0)
Anion gap: 9 (ref 5–15)
BILIRUBIN TOTAL: 0.6 mg/dL (ref 0.3–1.2)
BUN: 20 mg/dL (ref 6–20)
CO2: 29 mmol/L (ref 22–32)
CREATININE: 1.1 mg/dL (ref 0.61–1.24)
Calcium: 9.7 mg/dL (ref 8.9–10.3)
Chloride: 102 mmol/L (ref 101–111)
GFR calc Af Amer: >60 mL/min (ref >60)
Glucose, Bld: 81 mg/dL (ref 65–99)
Potassium: 4.3 mmol/L (ref 3.5–5.1)
Sodium: 140 mmol/L (ref 135–145)
TOTAL PROTEIN: 7.1 g/dL (ref 6.5–8.1)

## 2015-03-10 LAB — APTT: aPTT: 28 seconds (ref 24–37)

## 2015-03-10 LAB — URINALYSIS, ROUTINE W REFLEX MICROSCOPIC
Bilirubin Urine: NEGATIVE
Glucose, UA: NEGATIVE mg/dL
Hgb urine dipstick: NEGATIVE
KETONES UR: NEGATIVE mg/dL
LEUKOCYTES UA: NEGATIVE
NITRITE: NEGATIVE
PH: 7.5 (ref 5.0–8.0)
Protein, ur: NEGATIVE mg/dL
SPECIFIC GRAVITY, URINE: 1.018 (ref 1.005–1.030)

## 2015-03-10 LAB — SURGICAL PCR SCREEN
MRSA, PCR: NEGATIVE
STAPHYLOCOCCUS AUREUS: NEGATIVE

## 2015-03-10 LAB — TYPE AND SCREEN
ABO/RH(D): O POS
Antibody Screen: NEGATIVE

## 2015-03-10 LAB — CBC
HEMATOCRIT: 41.5 % (ref 39.0–52.0)
Hemoglobin: 13.6 g/dL (ref 13.0–17.0)
MCH: 32.7 pg (ref 26.0–34.0)
MCHC: 32.8 g/dL (ref 30.0–36.0)
MCV: 99.8 fL (ref 78.0–100.0)
Platelets: 207 10*3/uL (ref 150–400)
RBC: 4.16 MIL/uL — AB (ref 4.22–5.81)
RDW: 12.6 % (ref 11.5–15.5)
WBC: 3.8 10*3/uL — AB (ref 4.0–10.5)

## 2015-03-10 LAB — PROTIME-INR
INR: 1.13 (ref 0.00–1.49)
PROTHROMBIN TIME: 14.7 s (ref 11.6–15.2)

## 2015-03-10 LAB — ABO/RH: ABO/RH(D): O POS

## 2015-03-10 MED ORDER — CEFUROXIME SODIUM 1.5 G IJ SOLR
1.5000 g | INTRAMUSCULAR | Status: AC
  Administered 2015-03-11: 1.5 g via INTRAVENOUS
  Filled 2015-03-10: qty 1.5

## 2015-03-10 MED ORDER — CHLORHEXIDINE GLUCONATE CLOTH 2 % EX PADS: 6.0000 | MEDICATED_PAD | Freq: Once | CUTANEOUS | Status: DC

## 2015-03-10 MED ORDER — SODIUM CHLORIDE 0.9 % IV SOLN: INTRAVENOUS | Status: DC

## 2015-03-11 ENCOUNTER — Inpatient Hospital Stay (HOSPITAL_COMMUNITY): Payer: PPO | Admitting: Certified Registered Nurse Anesthetist

## 2015-03-11 ENCOUNTER — Inpatient Hospital Stay (HOSPITAL_COMMUNITY)
Admission: RE | Admit: 2015-03-11 | Discharge: 2015-03-12 | DRG: 039 | Disposition: A | Discharge status: Home / Self Care | Payer: PPO | Source: Ambulatory Visit | Attending: Vascular Surgery | Admitting: Vascular Surgery

## 2015-03-11 ENCOUNTER — Encounter (HOSPITAL_COMMUNITY): Payer: Self-pay | Admitting: Certified Registered Nurse Anesthetist

## 2015-03-11 ENCOUNTER — Encounter (HOSPITAL_COMMUNITY)
Admission: RE | Disposition: A | Discharge status: Home / Self Care | Payer: Self-pay | Source: Ambulatory Visit | Attending: Vascular Surgery

## 2015-03-11 DIAGNOSIS — Z8249 Family history of ischemic heart disease and other diseases of the circulatory system: Secondary | ICD-10-CM | POA: Diagnosis not present

## 2015-03-11 DIAGNOSIS — I2510 Atherosclerotic heart disease of native coronary artery without angina pectoris: Secondary | ICD-10-CM | POA: Diagnosis present

## 2015-03-11 DIAGNOSIS — Z87891 Personal history of nicotine dependence: Secondary | ICD-10-CM

## 2015-03-11 DIAGNOSIS — Z955 Presence of coronary angioplasty implant and graft: Secondary | ICD-10-CM

## 2015-03-11 DIAGNOSIS — N400 Enlarged prostate without lower urinary tract symptoms: Secondary | ICD-10-CM | POA: Diagnosis not present

## 2015-03-11 DIAGNOSIS — I739 Peripheral vascular disease, unspecified: Secondary | ICD-10-CM | POA: Diagnosis not present

## 2015-03-11 DIAGNOSIS — Z9109 Other allergy status, other than to drugs and biological substances: Secondary | ICD-10-CM

## 2015-03-11 DIAGNOSIS — M1990 Unspecified osteoarthritis, unspecified site: Secondary | ICD-10-CM | POA: Diagnosis not present

## 2015-03-11 DIAGNOSIS — I10 Essential (primary) hypertension: Secondary | ICD-10-CM | POA: Diagnosis present

## 2015-03-11 DIAGNOSIS — Z7982 Long term (current) use of aspirin: Secondary | ICD-10-CM

## 2015-03-11 DIAGNOSIS — I6522 Occlusion and stenosis of left carotid artery: Principal | ICD-10-CM | POA: Diagnosis present

## 2015-03-11 DIAGNOSIS — Z79899 Other long term (current) drug therapy: Secondary | ICD-10-CM

## 2015-03-11 DIAGNOSIS — E785 Hyperlipidemia, unspecified: Secondary | ICD-10-CM | POA: Diagnosis present

## 2015-03-11 DIAGNOSIS — I480 Paroxysmal atrial fibrillation: Secondary | ICD-10-CM | POA: Diagnosis present

## 2015-03-11 DIAGNOSIS — Z8673 Personal history of transient ischemic attack (TIA), and cerebral infarction without residual deficits: Secondary | ICD-10-CM

## 2015-03-11 HISTORY — PX: PATCH ANGIOPLASTY: SHX6230

## 2015-03-11 HISTORY — PX: ENDARTERECTOMY: SHX5162

## 2015-03-11 SURGERY — ENDARTERECTOMY, CAROTID
Anesthesia: General | Site: Neck | Laterality: Left

## 2015-03-11 MED ORDER — SODIUM CHLORIDE 0.9 % IV SOLN: 500.0000 mL | Freq: Once | INTRAVENOUS | Status: DC | PRN

## 2015-03-11 MED ORDER — OXYCODONE HCL 5 MG PO TABS
ORAL_TABLET | ORAL | Status: AC
  Filled 2015-03-11: qty 1

## 2015-03-11 MED ORDER — FINASTERIDE 5 MG PO TABS
5.0000 mg | ORAL_TABLET | Freq: Every day | ORAL | Status: DC
  Administered 2015-03-11 – 2015-03-12 (×2): 5 mg via ORAL
  Filled 2015-03-11 (×2): qty 1

## 2015-03-11 MED ORDER — PROPOFOL 10 MG/ML IV BOLUS
INTRAVENOUS | Status: AC
  Filled 2015-03-11: qty 40

## 2015-03-11 MED ORDER — ACETAMINOPHEN 325 MG PO TABS: 325.0000 mg | ORAL_TABLET | ORAL | Status: DC | PRN

## 2015-03-11 MED ORDER — ADULT MULTIVITAMIN W/MINERALS CH
1.0000 | ORAL_TABLET | Freq: Every day | ORAL | Status: DC
  Administered 2015-03-11 – 2015-03-12 (×2): 1 via ORAL
  Filled 2015-03-11 (×2): qty 1

## 2015-03-11 MED ORDER — PROMETHAZINE HCL 25 MG/ML IJ SOLN: 6.2500 mg | INTRAMUSCULAR | Status: DC | PRN

## 2015-03-11 MED ORDER — FENTANYL CITRATE (PF) 100 MCG/2ML IJ SOLN
25.0000 ug | INTRAMUSCULAR | Status: DC | PRN
  Administered 2015-03-11 (×3): 50 ug via INTRAVENOUS

## 2015-03-11 MED ORDER — MIDAZOLAM HCL 2 MG/2ML IJ SOLN
INTRAMUSCULAR | Status: AC
  Filled 2015-03-11: qty 2

## 2015-03-11 MED ORDER — GLYCOPYRROLATE 0.2 MG/ML IJ SOLN
INTRAMUSCULAR | Status: DC | PRN
  Administered 2015-03-11: 0.1 mg via INTRAVENOUS
  Administered 2015-03-11: 0.6 mg via INTRAVENOUS

## 2015-03-11 MED ORDER — ASPIRIN EC 81 MG PO TBEC
81.0000 mg | DELAYED_RELEASE_TABLET | Freq: Every day | ORAL | Status: DC
  Administered 2015-03-12: 81 mg via ORAL
  Filled 2015-03-11: qty 1

## 2015-03-11 MED ORDER — PROTAMINE SULFATE 10 MG/ML IV SOLN
INTRAVENOUS | Status: DC | PRN
  Administered 2015-03-11: 50 mg via INTRAVENOUS

## 2015-03-11 MED ORDER — EPHEDRINE SULFATE 50 MG/ML IJ SOLN
INTRAMUSCULAR | Status: AC
  Filled 2015-03-11: qty 2

## 2015-03-11 MED ORDER — VITAMIN C 500 MG PO TABS
2000.0000 mg | ORAL_TABLET | Freq: Every day | ORAL | Status: DC
  Administered 2015-03-11 – 2015-03-12 (×2): 2000 mg via ORAL
  Filled 2015-03-11 (×2): qty 4

## 2015-03-11 MED ORDER — 0.9 % SODIUM CHLORIDE (POUR BTL) OPTIME
TOPICAL | Status: DC | PRN
  Administered 2015-03-11: 2000 mL

## 2015-03-11 MED ORDER — ACETAMINOPHEN 10 MG/ML IV SOLN
1000.0000 mg | Freq: Once | INTRAVENOUS | Status: AC
  Administered 2015-03-11: 1000 mg via INTRAVENOUS

## 2015-03-11 MED ORDER — CALCIUM CARBONATE 600 MG PO TABS: 600.0000 mg | ORAL_TABLET | Freq: Every day | ORAL | Status: DC

## 2015-03-11 MED ORDER — LABETALOL HCL 5 MG/ML IV SOLN: 10.0000 mg | INTRAVENOUS | Status: DC | PRN

## 2015-03-11 MED ORDER — HYDROMORPHONE HCL 1 MG/ML IJ SOLN
INTRAMUSCULAR | Status: AC
  Administered 2015-03-11: 0.5 mg via INTRAVENOUS
  Filled 2015-03-11: qty 1

## 2015-03-11 MED ORDER — MORPHINE SULFATE (PF) 2 MG/ML IV SOLN: 2.0000 mg | INTRAVENOUS | Status: DC | PRN

## 2015-03-11 MED ORDER — REMIFENTANIL HCL 1 MG IV SOLR
INTRAVENOUS | Status: DC | PRN
  Administered 2015-03-11: .1 ug/kg/min via INTRAVENOUS

## 2015-03-11 MED ORDER — FENTANYL CITRATE (PF) 100 MCG/2ML IJ SOLN
INTRAMUSCULAR | Status: AC
  Administered 2015-03-11: 50 ug via INTRAVENOUS
  Filled 2015-03-11: qty 2

## 2015-03-11 MED ORDER — MIDAZOLAM HCL 5 MG/5ML IJ SOLN
INTRAMUSCULAR | Status: DC | PRN
  Administered 2015-03-11: 1 mg via INTRAVENOUS

## 2015-03-11 MED ORDER — DILTIAZEM HCL ER COATED BEADS 300 MG PO TB24: 300.0000 mg | ORAL_TABLET | Freq: Four times a day (QID) | ORAL | Status: DC | PRN

## 2015-03-11 MED ORDER — ROCURONIUM BROMIDE 100 MG/10ML IV SOLN
INTRAVENOUS | Status: DC | PRN
  Administered 2015-03-11: 50 mg via INTRAVENOUS

## 2015-03-11 MED ORDER — OXYCODONE HCL 5 MG PO TABS
5.0000 mg | ORAL_TABLET | ORAL | Status: DC | PRN
  Administered 2015-03-11: 5 mg via ORAL
  Filled 2015-03-11: qty 1

## 2015-03-11 MED ORDER — PHENYLEPHRINE 40 MCG/ML (10ML) SYRINGE FOR IV PUSH (FOR BLOOD PRESSURE SUPPORT)
PREFILLED_SYRINGE | INTRAVENOUS | Status: AC
  Filled 2015-03-11: qty 10

## 2015-03-11 MED ORDER — ONDANSETRON HCL 4 MG/2ML IJ SOLN: 4.0000 mg | Freq: Four times a day (QID) | INTRAMUSCULAR | Status: DC | PRN

## 2015-03-11 MED ORDER — B COMPLEX-C PO TABS
1.0000 | ORAL_TABLET | Freq: Every day | ORAL | Status: DC
  Administered 2015-03-11 – 2015-03-12 (×2): 1 via ORAL
  Filled 2015-03-11 (×2): qty 1

## 2015-03-11 MED ORDER — HYDROCHLOROTHIAZIDE 25 MG PO TABS
25.0000 mg | ORAL_TABLET | Freq: Every day | ORAL | Status: DC
  Administered 2015-03-11 – 2015-03-12 (×2): 25 mg via ORAL
  Filled 2015-03-11 (×2): qty 1

## 2015-03-11 MED ORDER — FENTANYL CITRATE (PF) 250 MCG/5ML IJ SOLN
INTRAMUSCULAR | Status: DC | PRN
  Administered 2015-03-11 (×2): 50 ug via INTRAVENOUS

## 2015-03-11 MED ORDER — ACETAMINOPHEN 650 MG RE SUPP: 325.0000 mg | RECTAL | Status: DC | PRN

## 2015-03-11 MED ORDER — SUCCINYLCHOLINE CHLORIDE 20 MG/ML IJ SOLN
INTRAMUSCULAR | Status: AC
  Filled 2015-03-11: qty 1

## 2015-03-11 MED ORDER — NEOSTIGMINE METHYLSULFATE 10 MG/10ML IV SOLN
INTRAVENOUS | Status: DC | PRN
  Administered 2015-03-11: 3 mg via INTRAVENOUS

## 2015-03-11 MED ORDER — FENTANYL CITRATE (PF) 250 MCG/5ML IJ SOLN
INTRAMUSCULAR | Status: AC
  Filled 2015-03-11: qty 5

## 2015-03-11 MED ORDER — VITAMIN D3 25 MCG (1000 UNIT) PO TABS
1000.0000 [IU] | ORAL_TABLET | Freq: Every day | ORAL | Status: DC
  Administered 2015-03-11 – 2015-03-12 (×2): 1000 [IU] via ORAL
  Filled 2015-03-11 (×3): qty 1

## 2015-03-11 MED ORDER — DOCUSATE SODIUM 100 MG PO CAPS
100.0000 mg | ORAL_CAPSULE | Freq: Every day | ORAL | Status: DC
  Administered 2015-03-12: 100 mg via ORAL
  Filled 2015-03-11: qty 1

## 2015-03-11 MED ORDER — POTASSIUM CHLORIDE CRYS ER 20 MEQ PO TBCR: 20.0000 meq | EXTENDED_RELEASE_TABLET | Freq: Every day | ORAL | Status: DC | PRN

## 2015-03-11 MED ORDER — FENTANYL CITRATE (PF) 100 MCG/2ML IJ SOLN
25.0000 ug | INTRAMUSCULAR | Status: AC | PRN
  Administered 2015-03-11 (×3): 50 ug via INTRAVENOUS

## 2015-03-11 MED ORDER — CO Q 10 100 MG PO CAPS: 1.0000 | ORAL_CAPSULE | Freq: Every day | ORAL | Status: DC

## 2015-03-11 MED ORDER — ROCURONIUM BROMIDE 50 MG/5ML IV SOLN
INTRAVENOUS | Status: AC
  Filled 2015-03-11: qty 1

## 2015-03-11 MED ORDER — HEPARIN SODIUM (PORCINE) 1000 UNIT/ML IJ SOLN
INTRAMUSCULAR | Status: DC | PRN
  Administered 2015-03-11: 9 mL via INTRAVENOUS

## 2015-03-11 MED ORDER — HYDROMORPHONE HCL 1 MG/ML IJ SOLN
0.5000 mg | INTRAMUSCULAR | Status: AC | PRN
  Administered 2015-03-11 (×4): 0.5 mg via INTRAVENOUS

## 2015-03-11 MED ORDER — TAMSULOSIN HCL 0.4 MG PO CAPS
0.4000 mg | ORAL_CAPSULE | Freq: Every day | ORAL | Status: DC
  Administered 2015-03-11 – 2015-03-12 (×2): 0.4 mg via ORAL
  Filled 2015-03-11 (×2): qty 1

## 2015-03-11 MED ORDER — LIDOCAINE HCL (CARDIAC) 20 MG/ML IV SOLN
INTRAVENOUS | Status: AC
  Filled 2015-03-11: qty 10

## 2015-03-11 MED ORDER — LIDOCAINE HCL (CARDIAC) 20 MG/ML IV SOLN
INTRAVENOUS | Status: DC | PRN
  Administered 2015-03-11 (×2): 20 mg via INTRAVENOUS

## 2015-03-11 MED ORDER — ACETAMINOPHEN 10 MG/ML IV SOLN: 1000.0000 mg | Freq: Four times a day (QID) | INTRAVENOUS | Status: DC

## 2015-03-11 MED ORDER — MIDAZOLAM HCL 2 MG/2ML IJ SOLN: 0.5000 mg | Freq: Once | INTRAMUSCULAR | Status: DC | PRN

## 2015-03-11 MED ORDER — SODIUM CHLORIDE 0.9 % IV SOLN
INTRAVENOUS | Status: DC
  Administered 2015-03-11: 19:00:00 via INTRAVENOUS

## 2015-03-11 MED ORDER — MAGNESIUM HYDROXIDE 400 MG/5ML PO SUSP: 30.0000 mL | Freq: Every day | ORAL | Status: DC | PRN

## 2015-03-11 MED ORDER — DEXTROSE 5 % IV SOLN
1.5000 g | Freq: Two times a day (BID) | INTRAVENOUS | Status: AC
  Administered 2015-03-11 – 2015-03-12 (×2): 1.5 g via INTRAVENOUS
  Filled 2015-03-11 (×2): qty 1.5

## 2015-03-11 MED ORDER — MEPERIDINE HCL 25 MG/ML IJ SOLN: 6.2500 mg | INTRAMUSCULAR | Status: DC | PRN

## 2015-03-11 MED ORDER — METOPROLOL TARTRATE 1 MG/ML IV SOLN: 2.0000 mg | INTRAVENOUS | Status: DC | PRN

## 2015-03-11 MED ORDER — ONDANSETRON HCL 4 MG/2ML IJ SOLN
INTRAMUSCULAR | Status: DC | PRN
  Administered 2015-03-11: 4 mg via INTRAVENOUS

## 2015-03-11 MED ORDER — ACETAMINOPHEN 10 MG/ML IV SOLN
INTRAVENOUS | Status: AC
  Filled 2015-03-11: qty 100

## 2015-03-11 MED ORDER — PHENOL 1.4 % MT LIQD
1.0000 | OROMUCOSAL | Status: DC | PRN
Start: 2015-03-11 — End: 2015-03-12
  Filled 2015-03-11: qty 177

## 2015-03-11 MED ORDER — BISACODYL 10 MG RE SUPP: 10.0000 mg | Freq: Every day | RECTAL | Status: DC | PRN

## 2015-03-11 MED ORDER — SODIUM CHLORIDE 0.9 % IV SOLN
0.0125 ug/kg/min | INTRAVENOUS | Status: DC
  Filled 2015-03-11: qty 2000

## 2015-03-11 MED ORDER — LACTATED RINGERS IV SOLN
INTRAVENOUS | Status: DC | PRN
  Administered 2015-03-11 (×3): via INTRAVENOUS

## 2015-03-11 MED ORDER — ROSUVASTATIN CALCIUM 40 MG PO TABS
40.0000 mg | ORAL_TABLET | Freq: Every evening | ORAL | Status: DC
  Administered 2015-03-11: 40 mg via ORAL
  Filled 2015-03-11: qty 1

## 2015-03-11 MED ORDER — OXYCODONE HCL 5 MG PO TABS: 5.0000 mg | ORAL_TABLET | Freq: Four times a day (QID) | ORAL | Status: DC | PRN

## 2015-03-11 MED ORDER — GUAIFENESIN-DM 100-10 MG/5ML PO SYRP: 15.0000 mL | ORAL_SOLUTION | ORAL | Status: DC | PRN

## 2015-03-11 MED ORDER — EPHEDRINE SULFATE 50 MG/ML IJ SOLN
INTRAMUSCULAR | Status: DC | PRN
  Administered 2015-03-11: 10 mg via INTRAVENOUS
  Administered 2015-03-11: 15 mg via INTRAVENOUS

## 2015-03-11 MED ORDER — SODIUM CHLORIDE 0.9 % IV SOLN
INTRAVENOUS | Status: DC | PRN
  Administered 2015-03-11: 09:00:00

## 2015-03-11 MED ORDER — PROPOFOL 10 MG/ML IV BOLUS
INTRAVENOUS | Status: DC | PRN
  Administered 2015-03-11: 100 mg via INTRAVENOUS
  Administered 2015-03-11: 20 mg via INTRAVENOUS

## 2015-03-11 MED ORDER — ALUM & MAG HYDROXIDE-SIMETH 200-200-20 MG/5ML PO SUSP: 15.0000 mL | ORAL | Status: DC | PRN

## 2015-03-11 MED ORDER — PHENYLEPHRINE HCL 10 MG/ML IJ SOLN
10.0000 mg | INTRAVENOUS | Status: DC | PRN
  Administered 2015-03-11: 50 ug/min via INTRAVENOUS

## 2015-03-11 MED ORDER — PANTOPRAZOLE SODIUM 40 MG PO TBEC
40.0000 mg | DELAYED_RELEASE_TABLET | Freq: Every day | ORAL | Status: DC
  Administered 2015-03-11 – 2015-03-12 (×2): 40 mg via ORAL
  Filled 2015-03-11 (×2): qty 1

## 2015-03-11 MED ORDER — CALCIUM CARBONATE 1250 (500 CA) MG PO TABS
1.0000 | ORAL_TABLET | Freq: Every day | ORAL | Status: DC
  Administered 2015-03-12: 500 mg via ORAL
  Filled 2015-03-11: qty 1

## 2015-03-11 MED ORDER — GLYCOPYRROLATE 0.2 MG/ML IJ SOLN
INTRAMUSCULAR | Status: AC
  Filled 2015-03-11: qty 4

## 2015-03-11 MED ORDER — ONDANSETRON HCL 4 MG/2ML IJ SOLN
INTRAMUSCULAR | Status: AC
  Filled 2015-03-11: qty 2

## 2015-03-11 MED ORDER — STERILE WATER FOR INJECTION IJ SOLN
INTRAMUSCULAR | Status: AC
  Filled 2015-03-11: qty 30

## 2015-03-11 MED ORDER — HYDRALAZINE HCL 20 MG/ML IJ SOLN
5.0000 mg | INTRAMUSCULAR | Status: AC | PRN
  Administered 2015-03-12 (×2): 5 mg via INTRAVENOUS
  Filled 2015-03-11 (×2): qty 1

## 2015-03-11 MED ORDER — LIDOCAINE HCL (PF) 1 % IJ SOLN
INTRAMUSCULAR | Status: AC
  Filled 2015-03-11: qty 30

## 2015-03-11 SURGICAL SUPPLY — 39 items
APL SKNCLS STERI-STRIP NONHPOA (GAUZE/BANDAGES/DRESSINGS) ×1
BENZOIN TINCTURE PRP APPL 2/3 (GAUZE/BANDAGES/DRESSINGS) ×2 IMPLANT
CANISTER SUCTION 2500CC (MISCELLANEOUS) ×2 IMPLANT
CANNULA VESSEL 3MM 2 BLNT TIP (CANNULA) ×1 IMPLANT
CATH ROBINSON RED A/P 18FR (CATHETERS) ×2 IMPLANT
CLIP LIGATING EXTRA MED SLVR (CLIP) ×2 IMPLANT
CLIP LIGATING EXTRA SM BLUE (MISCELLANEOUS) ×2 IMPLANT
CRADLE DONUT ADULT HEAD (MISCELLANEOUS) ×2 IMPLANT
DRSG COVADERM 4X8 (GAUZE/BANDAGES/DRESSINGS) ×1 IMPLANT
ELECT REM PT RETURN 9FT ADLT (ELECTROSURGICAL) ×2
ELECTRODE REM PT RTRN 9FT ADLT (ELECTROSURGICAL) ×1 IMPLANT
GAUZE SPONGE 4X4 12PLY STRL (GAUZE/BANDAGES/DRESSINGS) ×2 IMPLANT
GLOVE BIO SURGEON STRL SZ 6.5 (GLOVE) ×2 IMPLANT
GLOVE BIOGEL PI IND STRL 6.5 (GLOVE) IMPLANT
GLOVE BIOGEL PI IND STRL 7.0 (GLOVE) IMPLANT
GLOVE BIOGEL PI INDICATOR 6.5 (GLOVE) ×1
GLOVE BIOGEL PI INDICATOR 7.0 (GLOVE) ×1
GLOVE ECLIPSE 6.5 STRL STRAW (GLOVE) ×2 IMPLANT
GLOVE SS BIOGEL STRL SZ 7.5 (GLOVE) ×1 IMPLANT
GLOVE SUPERSENSE BIOGEL SZ 7.5 (GLOVE) ×1
GLOVE SURG SS PI 7.0 STRL IVOR (GLOVE) ×1 IMPLANT
GOWN STRL REUS W/ TWL LRG LVL3 (GOWN DISPOSABLE) ×3 IMPLANT
GOWN STRL REUS W/ TWL XL LVL3 (GOWN DISPOSABLE) IMPLANT
GOWN STRL REUS W/TWL LRG LVL3 (GOWN DISPOSABLE) ×6
GOWN STRL REUS W/TWL XL LVL3 (GOWN DISPOSABLE) ×2
KIT BASIN OR (CUSTOM PROCEDURE TRAY) ×2 IMPLANT
KIT ROOM TURNOVER OR (KITS) ×2 IMPLANT
NS IRRIG 1000ML POUR BTL (IV SOLUTION) ×4 IMPLANT
PACK CAROTID (CUSTOM PROCEDURE TRAY) ×2 IMPLANT
PAD ARMBOARD 7.5X6 YLW CONV (MISCELLANEOUS) ×4 IMPLANT
PATCH HEMASHIELD 8X75 (Vascular Products) ×1 IMPLANT
SHUNT CAROTID BYPASS 10 (VASCULAR PRODUCTS) ×1 IMPLANT
SPONGE INTESTINAL PEANUT (DISPOSABLE) ×2 IMPLANT
STRIP CLOSURE SKIN 1/2X4 (GAUZE/BANDAGES/DRESSINGS) ×2 IMPLANT
SUT PROLENE 6 0 CC (SUTURE) ×2 IMPLANT
SUT VIC AB 3-0 SH 27 (SUTURE) ×4
SUT VIC AB 3-0 SH 27X BRD (SUTURE) ×2 IMPLANT
SUT VICRYL 4-0 PS2 18IN ABS (SUTURE) ×2 IMPLANT
WATER STERILE IRR 1000ML POUR (IV SOLUTION) ×2 IMPLANT

## 2015-03-11 NOTE — Progress Notes (Signed)
Patient ID: [name], male   DOB: April 11, 1940, 75 y.o.   MRN: [id number] Hemodynamically stable in PACU. In the usual amount of discomfort. Slight amount of drainage on his dressing. Dressing removed. Does have moderate swelling in his neck. Trachea possibly a minimal shift but not significant. No difficulty swallowing no feeling of hoarseness. We will watch and PACU for additional period prior to transferring to step down unit. Discussed with his wife present. Ports pain is 5 out of 10

## 2015-03-11 NOTE — H&P (View-Only) (Signed)
[facility] [place]  Patient name: [name] MRN: [id number] DOB: 1940/07/14 Sex: male  REASON FOR VISIT: Discussion of carotid stenosis and recent left brain TIA.  HPI: KEYVAN Frank is a 75 y.o. male known to me from open aneurysm repair 16 years ago and ongoing follow-up of left carotid stenosis. He has extensive calcification of this lesion and underwent further evaluation with a CT angiogram in 2015. That time his estimated degree of stenosis was approximately 60% and he remained asymptomatic. He recently was admitted to the hospital with an episode of expressive aphasia and right arm weakness. Fortunately resolved within 24 hours time. A carotid duplex showed significant progression in his systolic velocities. When we had seen him in March 2016 his maximal systolic velocity was in the 190s range on the left internal carotid artery. At the recent hospitalization was in the 290s range. He underwent repeat CT angiogram but unfortunately there was poor timing of contrast giving very little information regarding the level of stenosis. This did show extensive calcification in the carotid bifurcation. The fourchette has returned completely to normal. He is in chronic atrial fibrillation and is on oral anticoagulation for this.  Past Medical History  Diagnosis Date  . Hypertension   . Nocturia   . Hyperlipidemia   . Arthritis     HANDS AND FEET  . Cataract immature BILATERAL   . Hydrocele, left   . History of AAA (abdominal aortic aneurysm) repair 2000    W/ AORTOVIFEMORAL BYPASS  . Peripheral vascular disease ([place]) FOLLOWED BY DR EARLY-- VISIT NOTE 01-05-10 AND CAROTID / VASCULAR DOPPLER  RESULTS W/ CHART    ACTIVE WALKING PROGRAM  . Status post primary angioplasty with coronary stent 2002--  X2 BM STENTS  . 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 Retinal detachment   . Increased intraocular pressure   . History of inguinal herniorrhaphy   . History of  diverticulosis     Noted on Colonoscopy  . Coronary artery disease CARDIOLOGIST- DR Tressia Miners TURNER-- LAST VISIT NOV  2012-- WILL REQUEST NOTE AND STRESS TEST    s/p PCI 2000  . Asymptomatic carotid artery stenosis BILATERAL     PER DOPPLER  01-05-10   RICA  1 - [id number]   LICA  40 - [id number] - followed by Dr. Donnetta Hutching  . Diverticulosis   . H/O heart artery stent 02/19/00  . Paroxysmal atrial fibrillation ([place]) 06/30/14    chad2vasc score is at least 4  . Stroke [facility])     Family History  Problem Relation Age of Onset  . Heart disease Mother     AAA  Before age 50  . Cancer Mother 66    Colon cancer  . Hyperlipidemia Mother   . Heart failure Father     SOCIAL HISTORY: Social History  Substance Use Topics  . Smoking status: Former Smoker -- 30 years    Types: Cigarettes    Quit date: 01/18/1978  . Smokeless tobacco: Never Used  . Alcohol Use: 13.2 oz/week    21 Standard drinks or equivalent, 1 Shots of liquor per week     Comment: 2 oz of vodka per day    Allergies  Allergen Reactions  . Wasp Venom Shortness Of Breath and Swelling    Current Outpatient Prescriptions  Medication Sig Dispense Refill  . apixaban (ELIQUIS) 5 MG TABS tablet Take 1 tablet (5 mg total) by mouth 2 (two) times daily. 60 tablet 6  .  aspirin EC 81 MG tablet Take 81 mg by mouth daily.    [name] b complex vitamins tablet Take 1 tablet by mouth daily.      . calcium carbonate (OS-CAL) 600 MG TABS Take 600 mg by mouth daily.      . Cholecalciferol 1000 UNITS capsule Take 1,000 Units by mouth daily.    . Coenzyme Q10 (CO Q 10) 100 MG CAPS Take 1 capsule by mouth daily.    [name] diltiazem (CARDIZEM LA) 300 MG 24 hr tablet Take 300 mg by mouth 4 (four) times daily as needed (tachyarrhythmia).     [name] EPINEPHrine (EPIPEN) 0.3 mg/0.3 mL SOAJ injection Inject 0.3 mg into the muscle daily as needed (allergic reaction).     . finasteride (PROSCAR) 5 MG tablet Take 5 mg by mouth daily.    . hydrochlorothiazide (HYDRODIURIL) 25 MG  tablet Take 25 mg by mouth daily.      [name] losartan (COZAAR) 100 MG tablet Take 100 mg by mouth daily.    . Magnesium Hydroxide (MAGNESIA PO) Take 1,000 mg by mouth daily.     . Multiple Vitamin (MULTIVITAMIN) tablet Take 1 tablet by mouth daily.      . Omega-3 Fatty Acids (FISH OIL PO) Take 1,200 mg by mouth daily.    . rosuvastatin (CRESTOR) 40 MG tablet Take 1 tablet (40 mg total) by mouth every evening. 90 tablet 3  . Tamsulosin HCl (FLOMAX) 0.4 MG CAPS Take 0.4 mg by mouth daily.      . vitamin C (ASCORBIC ACID) 500 MG tablet Take 2,000 mg by mouth daily.     No current facility-administered medications for this visit.    REVIEW OF SYSTEMS:  [X]  denotes positive finding, [ ]  denotes negative finding Cardiac  Comments:  Chest pain or chest pressure:    Shortness of breath upon exertion:    Short of breath when lying flat:    Irregular heart rhythm:        Vascular    Pain in calf, thigh, or hip brought on by ambulation:    Pain in feet at night that wakes you up from your sleep:     Blood clot in your veins:    Leg swelling:         Pulmonary    Oxygen at home:    Productive cough:     Wheezing:         Neurologic    Sudden weakness in arms or legs:  x   Sudden numbness in arms or legs:  x   Sudden onset of difficulty speaking or slurred speech: x   Temporary loss of vision in one eye:     Problems with dizziness:         Gastrointestinal    Blood in stool:     Vomited blood:         Genitourinary    Burning when urinating:     Blood in urine:        Psychiatric    Major depression:         Hematologic    Bleeding problems:    Problems with blood clotting too easily:        Skin    Rashes or ulcers:        Constitutional    Fever or chills:      PHYSICAL EXAM: Filed Vitals:   03/03/15 1627  BP: 131/74  Pulse: 57  Temp: 97.5 F (36.4 C)  TempSrc:  Oral  Resp: 18  Height: 6' 0.5" (1.842 m)  Weight: 193 lb 12.8 oz (87.907 kg)  SpO2: 99%     GENERAL: The patient is a well-nourished male, in no acute distress. The vital signs are documented above. CARDIAC: There is a regular rate and rhythm.  VASCULAR: 2+ radial pulses. Carotid arteries without bruits bilaterally PULMONARY: There is good air exchange bilaterally without wheezing or rales. ABDOMEN: Soft and non-tender with normal pitched bowel sounds.  MUSCULOSKELETAL: There are no major deformities or cyanosis. NEUROLOGIC: No focal weakness or paresthesias are detected. SKIN: There are no ulcers or rashes noted. PSYCHIATRIC: The patient has a normal affect.  DATA:  Reviewed his duplex and also his CT angiogram images from several weeks ago and also from 2015.  MEDICAL ISSUES: Brain event with at least greater than 60% stenosis and probably greater than this. Extreme irregularity and calcification throughout his left carotid bifurcation. I suspect that this is his most likely cause of stroke. Have recommended left endarterectomy for reduction of stroke risk. Explained that this would be a one night hospitalization. Also explained the 1-2% risk of stroke with surgery. He understands and wished to proceed as soon as possible. We will stop his anticoagulation several days prior to surgery and continue his aspirin therapy. We'll resume this following surgery. No Follow-up on file.   Curt Jews [facility] [place]: [phone]

## 2015-03-11 NOTE — Interval H&P Note (Signed)
History and Physical Interval Note:  03/11/2015 8:00 AM  [name]  has presented today for surgery, with the diagnosis of Left carotid artery stenosis I65.22  The various methods of treatment have been discussed with the patient and family. After consideration of risks, benefits and other options for treatment, the patient has consented to  Procedure(s): ENDARTERECTOMY CAROTID (Left) as a surgical intervention .  The patient's history has been reviewed, patient examined, no change in status, stable for surgery.  I have reviewed the patient's chart and labs.  Questions were answered to the patient's satisfaction.     Curt Jews

## 2015-03-11 NOTE — Progress Notes (Signed)
  Day of Surgery Note    Subjective:  States he "feels like crap"  No gtts are being required.  Filed Vitals:   03/11/15 1130 03/11/15 1131  BP: 146/72 145/66  Pulse: 81 78  Temp:    Resp: 18 13    Incisions:   Left neck incision is clean and it tact with scant blood present Extremities:  Moving all extremities equally Cardiac:  regular Lungs:  Non labored Neuro:  In tact; tongue is midline   Assessment/Plan:  This is a 75 y.o. male who is s/p left carotid endarterectomy  -pt awake, alert and neurologically in tact in pacu -continue to get pain under better control -to [place] when bed available -anticipate discharge in day or two   Leontine Locket, PA-C 03/11/2015 12:01 PM

## 2015-03-11 NOTE — Anesthesia Postprocedure Evaluation (Signed)
Anesthesia Post Note  Patient: [name]  Procedure(s) Performed: Procedure(s) (LRB): ENDARTERECTOMY LEFT CAROTID (Left) PATCH ANGIOPLASTY LEFT CAROTID USING HEMASHIELD PLATINUM FINESSE PATCH (Left)  Patient location during evaluation: PACU Anesthesia Type: General Level of consciousness: awake and alert, oriented and patient cooperative Pain management: pain level controlled Vital Signs Assessment: post-procedure vital signs reviewed and stable Respiratory status: spontaneous breathing, nonlabored ventilation, respiratory function stable and patient connected to nasal cannula oxygen Cardiovascular status: blood pressure returned to baseline and stable Postop Assessment: no signs of nausea or vomiting Anesthetic complications: no    Last Vitals:  Filed Vitals:   03/11/15 1445 03/11/15 1500  BP: 134/63 135/71  Pulse: 59 52  Temp:    Resp: 11 8    Last Pain:  Filed Vitals:   03/11/15 1503  PainSc: 3                  Syrita Dovel,E. Makenna Macaluso

## 2015-03-11 NOTE — Anesthesia Procedure Notes (Addendum)
Procedure Name: Intubation Date/Time: 03/11/2015 8:47 AM Performed by: Bethel Born Pre-anesthesia Checklist: Patient identified, Timeout performed, Emergency Drugs available, Suction available and Patient being monitored Patient Re-evaluated:Patient Re-evaluated prior to inductionOxygen Delivery Method: Circle system utilized Preoxygenation: Pre-oxygenation with 100% oxygen Intubation Type: IV induction Ventilation: Mask ventilation without difficulty Laryngoscope Size: Mac and 3 Grade View: Grade III Tube type: Oral Tube size: 7.5 mm Airway Equipment and Method: Bougie stylet Placement Confirmation: ETT inserted through vocal cords under direct vision,  breath sounds checked- equal and bilateral and positive ETCO2 Secured at: 23 cm Tube secured with: Tape Dental Injury: Teeth and Oropharynx as per pre-operative assessment  Difficulty Due To: Difficulty was unanticipated and Difficult Airway- due to reduced neck mobility Comments: Reduced neck mobility.  DL x1 with Mil 2 and no view.  DL x2 with Mac 3 no view.  MD DL with Mac 3.  Intubated with bougie and grade 3 view.

## 2015-03-11 NOTE — Anesthesia Preprocedure Evaluation (Addendum)
Anesthesia Evaluation  Patient identified by MRN, date of birth, ID band Patient awake    Reviewed: Allergy & Precautions, NPO status , Patient's Chart, lab work & pertinent test results  History of Anesthesia Complications Negative for: history of anesthetic complications  Airway Mallampati: II  TM Distance: >3 FB Neck ROM: Full    Dental  (+) Teeth Intact, Dental Advisory Given   Pulmonary former smoker (quit 1980),    breath sounds clear to auscultation       Cardiovascular hypertension, Pt. on medications + CAD, + Cardiac Stents and + Peripheral Vascular Disease (s/p Aorto-fem)  + dysrhythmias Atrial Fibrillation  Rhythm:Regular Rate:Normal  2/17 ECHO: EF 60-65%, valves OK   Neuro/Psych TIACVA, No Residual Symptoms    GI/Hepatic negative GI ROS, Neg liver ROS,   Endo/Other  negative endocrine ROS  Renal/GU negative Renal ROS     Musculoskeletal  (+) Arthritis , Osteoarthritis,    Abdominal (+) + obese,   Peds  Hematology negative hematology ROS (+)   Anesthesia Other Findings   Reproductive/Obstetrics                            Anesthesia Physical Anesthesia Plan  ASA: III  Anesthesia Plan: General   Post-op Pain Management:    Induction: Intravenous  Airway Management Planned: Oral ETT  Additional Equipment: Arterial line  Intra-op Plan:   Post-operative Plan: Extubation in OR  Informed Consent: I have reviewed the patients History and Physical, chart, labs and discussed the procedure including the risks, benefits and alternatives for the proposed anesthesia with the patient or authorized representative who has indicated his/her understanding and acceptance.   Dental advisory given  Plan Discussed with: CRNA and Surgeon  Anesthesia Plan Comments: (Plan routine monitors, A line, GETA)        Anesthesia Quick Evaluation

## 2015-03-11 NOTE — Op Note (Signed)
     Patient name: [name] MRN: [id number] DOB: May 10, 1940 Sex: male  03/11/2015 Pre-operative Diagnosis: Symptomatic left carotid stenosis Post-operative diagnosis:  Same Surgeon:  Rosetta Posner, M.D. Assistants:  Leontine Locket PA-C Procedure:    left carotid Endarterectomy with Dacron patch angioplasty Anesthesia:  General Blood Loss:  See anesthesia record Specimens:  Carotid Plaque to pathology  Indications for surgery:  Recent left brain TIA  Procedure in detail:  The patient was taken to the operating and placed in the supine position. The neck was prepped and draped in the usual sterile fashion. An incision was made anterior to the sternocleidomastoid muscle and continued with electrocautery through the platysma muscle. The muscle was retracted posteriorly and the carotid sheath was opened. The facial vein was ligated with 2-0 silk ties and divided. The common carotid artery was encircled with an umbilical tape and Rummel tourniquet. Dissection was continued onto the carotid bifurcation. The superior thyroid artery was controlled with a 2-0 silk Potts tie. The external carotid organ was encircled with a vessel loop and the internal carotid was encircled with umbilical tape and Rummel tourniquet. The hypoglossal and vagus nerves were identified and preserved.  The patient was given systemic heparinization. After adequate circulation time, the internal,external and common carotid arteries were occluded. The common carotid was opened with an 11 blade and the arteriotomy was continued with Potts scissors onto the internal carotid artery. A 10 shunt was passed up the internal carotid artery, allowed to back bleed, and then passed down the common carotid artery. The shunt was secured with Rummel tourniquet. The endarterectomy was begun on the common carotid artery  plaque was divided proximally with Potts scissors. The endarterectomy was continued onto the carotid bifurcation. The  external carotid was endarterectomized by eversion technique and the internal carotid artery was endarterectomized in an open fashion. Remaining debris was removed from the endarterectomy plane. A Dacron patch was brought to the field and sewn as a patch angioplasty. Prior to completing the anastomosis, the shunt was removed and the usual flushing maneuvers were undertaken. The anastomosis was then completed and flow was restored first to the external and then the internal carotid artery. Excellent flow characteristics were noted with hand-held Doppler in the internal and external carotid arteries.  The patient was given protamine to reverse the heparin. Hemostasis was obtained with electrocautery. The wounds were irrigated with saline. The wound was closed by first reapproximating the sternocleidomastoid muscle over the carotid artery with interrupted 3-0 Vicryl sutures. Next, the platysma was closed with a running 3-0 Vicryl suture. The skin was closed with a 4-0 subcuticular Vicryl suture. Benzoin and Steri-Strips were applied to the incision. A sterile dressing was placed over the incision. All sponge and needle counts were correct. The patient was awakened in the operating room, neurologically intact. They were transferred to the PACU in stable condition.  Carotid stenosis at surgery: 80%  Disposition:  To PACU in stable condition,neurologically intact  Relevant Operative Details:  Severe calcification  Rosetta Posner, M.D. [facility] [place] Office: [phone] Pager:  [phone]

## 2015-03-11 NOTE — Transfer of Care (Signed)
Immediate Anesthesia Transfer of Care Note  Patient: [name]  Procedure(s) Performed: Procedure(s): ENDARTERECTOMY LEFT CAROTID (Left) PATCH ANGIOPLASTY LEFT CAROTID USING HEMASHIELD PLATINUM FINESSE PATCH (Left)  Patient Location: PACU  Anesthesia Type:General  Level of Consciousness: awake, alert , oriented and patient cooperative  Airway & Oxygen Therapy: Patient Spontanous Breathing and Patient connected to nasal cannula oxygen  Post-op Assessment: Report given to RN, Post -op Vital signs reviewed and stable, Patient moving all extremities X 4 and Patient able to stick tongue midline  Post vital signs: Reviewed and stable  Last Vitals:  Filed Vitals:   03/11/15 0650  BP: 150/68  Pulse: 54  Temp: 36.2 C  Resp: 18    Complications: No apparent anesthesia complications

## 2015-03-12 ENCOUNTER — Encounter (HOSPITAL_COMMUNITY): Payer: Self-pay | Admitting: Vascular Surgery

## 2015-03-12 LAB — BASIC METABOLIC PANEL
Anion gap: 7 (ref 5–15)
BUN: 14 mg/dL (ref 6–20)
CALCIUM: 8.5 mg/dL — AB (ref 8.9–10.3)
CHLORIDE: 101 mmol/L (ref 101–111)
CO2: 26 mmol/L (ref 22–32)
Creatinine, Ser: 0.78 mg/dL (ref 0.61–1.24)
GFR calc Af Amer: >60 mL/min (ref >60)
GLUCOSE: 111 mg/dL — AB (ref 65–99)
POTASSIUM: 3.6 mmol/L (ref 3.5–5.1)
SODIUM: 134 mmol/L — AB (ref 135–145)

## 2015-03-12 LAB — CBC
HCT: 34.8 % — ABNORMAL LOW (ref 39.0–52.0)
HEMOGLOBIN: 11.5 g/dL — AB (ref 13.0–17.0)
MCH: 32.4 pg (ref 26.0–34.0)
MCHC: 33 g/dL (ref 30.0–36.0)
MCV: 98 fL (ref 78.0–100.0)
Platelets: 166 10*3/uL (ref 150–400)
RBC: 3.55 MIL/uL — AB (ref 4.22–5.81)
RDW: 12.3 % (ref 11.5–15.5)
WBC: 6.7 10*3/uL (ref 4.0–10.5)

## 2015-03-12 MED ORDER — DILTIAZEM HCL 30 MG PO TABS: 30.0000 mg | ORAL_TABLET | Freq: Four times a day (QID) | ORAL | Status: DC | PRN

## 2015-03-12 NOTE — Progress Notes (Signed)
Pt voice has become hoarse and raspy, but denies difficulty swallowing or breathing.  Swelling at incision site noted, but still soft.  Pain well controlled, tongue midline, symmetrical smile, no deficits noted.  Will continue to monitor.

## 2015-03-12 NOTE — Progress Notes (Addendum)
  Progress Note    03/12/2015 7:23 AM 1 Day Post-Op  Subjective:  No complaints of pain; says his voice became raspy last evening around 6pm; denies trouble breathing or swallowing.  Says he had to have I&O cath.  Takes meds at home for prostate.  afebrile HR 50's-60's SB [id number] systolic [id number] RA  Filed Vitals:   03/12/15 0100 03/12/15 0300  BP: 126/62 136/64  Pulse: 51 53  Temp:  97.9 F (36.6 C)  Resp: 13 13     Physical Exam: Neuro:  In tact; moving all extremities equally; tongue is midline and smile symmetric  Lungs:  Non labored Incision:  Clean and dry with continued moderate swelling.  CBC    Component Value Date/Time   WBC 6.7 03/12/2015 0430   RBC 3.55* 03/12/2015 0430   HGB 11.5* 03/12/2015 0430   HCT 34.8* 03/12/2015 0430   PLT 166 03/12/2015 0430   MCV 98.0 03/12/2015 0430   MCH 32.4 03/12/2015 0430   MCHC 33.0 03/12/2015 0430   RDW 12.3 03/12/2015 0430   LYMPHSABS 1.6 02/21/2015 2257   MONOABS 1.0 02/21/2015 2257   EOSABS 0.1 02/21/2015 2257   BASOSABS 0.0 02/21/2015 2257    BMET    Component Value Date/Time   NA 134* 03/12/2015 0430   K 3.6 03/12/2015 0430   CL 101 03/12/2015 0430   CO2 26 03/12/2015 0430   GLUCOSE 111* 03/12/2015 0430   BUN 14 03/12/2015 0430   CREATININE 0.78 03/12/2015 0430   CREATININE 0.89 08/13/2014 0933   CALCIUM 8.5* 03/12/2015 0430   GFRNONAA >60 03/12/2015 0430   GFRAA >60 03/12/2015 0430     Intake/Output Summary (Last 24 hours) at 03/12/15 0723 Last data filed at 03/12/15 0300  Gross per 24 hour  Intake   2385 ml  Output    862 ml  Net   1523 ml     Assessment/Plan:  This is a 75 y.o. male who is s/p left CEA 1 Day Post-Op  -pt is doing well this am.  Denies any pain in his left neck.   -pt neuro exam is in tact; tongue midline -he does continue to have moderate swelling of the left neck.  No difficulty swallowing or breathing.   His voice is raspy, which started around 6pm last evening. -he is  on Eliquis for PAF-will hold this for now.   -pt has ambulated -pt has voided, but needed I&O cath yesterday-he has voided about 100cc this morning.  Due for another I&O, however, may need foley and f/u with urologist.  He is on Flomax and Proscar.  His BPH is managed by his PCP.   Will check back on him later this morning to see if he is voiding adequately. -f/u with Dr. Donnetta Hutching in 2 weeks.   Leontine Locket, PA-C [facility] [phone] I have examined the patient, reviewed and agree with above. Has trouble with BPH. Appears to be voiding with some post void residual. Will make sure he is comfortable this morning and planned discharge later today  Curt Jews, MD 03/12/2015 10:30 AM

## 2015-03-12 NOTE — Discharge Instructions (Signed)
Restart eliquis on Saturday, March 14, 2015

## 2015-03-12 NOTE — Progress Notes (Signed)
Discharge instructions reviewed with the patient, his wife and daughter who was at bedside. Prescription given to the patients wife. PIV removed. Instructions regarding post op and incisional care and signs and symptoms of infection reviewed with patient. All questions answered. Patient made aware he was not to take his eloquis until Saturday 2/25 per MD. Patient and wife verbalized understanding of all discharge instructions. VSS. Pt in no acute distress. Discharged via wheelchair.

## 2015-03-12 NOTE — Progress Notes (Addendum)
Pt only able to void 32mL after removal of urinary cath.  Bladder scan earlier revealed >467mL, pt given more time to void on his own, but unable.  In/out cath produced 754mL urine with 65mL post bladder scan.  Will continue to monitor.

## 2015-03-12 NOTE — Discharge Summary (Signed)
Discharge Summary     [name] 01-Jul-1940 75 y.o. 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 male known to me from open aneurysm repair 16 years ago and ongoing follow-up of left carotid stenosis. He has extensive calcification of this lesion and underwent further evaluation with a CT angiogram in 2015. That time his estimated degree of stenosis was approximately 60% and he remained asymptomatic. He recently was admitted to the hospital with an episode of expressive aphasia and right arm weakness. Fortunately resolved within 24 hours time. A carotid duplex showed significant progression in his systolic velocities. When we had seen him in March 2016 his maximal systolic velocity was in the 190s range on the left internal carotid artery. At the recent hospitalization was in the 290s range. He underwent repeat CT angiogram but unfortunately there was poor timing of contrast giving very little information regarding the level of stenosis. This did show extensive calcification in the carotid bifurcation. The fourchette has returned completely to normal. He is in chronic atrial fibrillation and is on oral anticoagulation for this.  Hospital Course:  The patient was admitted to the hospital and taken to the operating room on 03/11/2015 and underwent left carotid endarterectomy.  The pt tolerated the procedure well and was transported to the PACU in good condition.  In the PACU, he was Hemodynamically stable in PACU. In the usual amount of discomfort. Slight amount of drainage on his dressing. Dressing removed. Does have moderate swelling in his neck. Trachea possibly a minimal shift but not significant. No difficulty swallowing no feeling of hoarseness. We will watch and PACU for additional period prior to transferring to step down unit. Discussed  with his wife present. Ports pain is 5 out of 10.   By POD 1, the pt neuro status was in tact.  He did require an I&O catheter as he could not void.  He has known prostate issues and is on medication.  He did have some moderate swelling in the left neck.  He states that his voice became raspy the evening before.  He denies any trouble swallowing or breathing.   The pt states he normally does not completely empty his bladder.  Throughout the morning and afternoon, he was voiding adequately without difficulty and was discharged home.  The remainder of the hospital course consisted of increasing mobilization and increasing intake of solids without difficulty.    Recent Labs  03/10/15 0944 03/12/15 0430  NA 140 134*  K 4.3 3.6  CL 102 101  CO2 29 26  GLUCOSE 81 111*  BUN 20 14  CALCIUM 9.7 8.5*    Recent Labs  03/10/15 0944 03/12/15 0430  WBC 3.8* 6.7  HGB 13.6 11.5*  HCT 41.5 34.8*  PLT 207 166    Recent Labs  03/10/15 0944  INR 1.13     Discharge Instructions    CAROTID Sugery: Call MD for difficulty swallowing or speaking; weakness in arms or legs that is a new symtom; severe headache.  If you have increased swelling in the neck and/or  are having difficulty breathing, CALL 911    Complete by:  As directed      Call MD for:  redness, tenderness, or signs of infection (pain, swelling, bleeding, redness, odor or green/yellow discharge around incision site)    Complete by:  As directed      Call MD for:  severe or increased pain, loss or decreased feeling  in affected limb(s)    Complete by:  As directed      Call MD for:  temperature >100.5    Complete by:  As directed      Discharge instructions    Complete by:  As directed   Restart eliquis on Saturday, March 14, 2015     Discharge wound care:    Complete by:  As directed   Shower daily with soap and water starting 03/13/15     Driving Restrictions    Complete by:  As directed   No driving for 2 weeks      Lifting restrictions    Complete by:  As directed   No lifting for 2 weeks     Resume previous diet    Complete by:  As directed            Discharge Diagnosis:  Left carotid artery stenosis 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 Symptomatic stenosis of left carotid artery 03/11/2015  . Carotid stenosis 02/25/2015  . HLD (hyperlipidemia) 02/25/2015  . Essential hypertension 02/25/2015  . Stroke ([place]) 02/22/2015  . CVA (cerebral infarction) 02/22/2015  . Acute CVA (cerebrovascular accident) ([place]) 02/22/2015  . Cerebrovascular accident (CVA) due to occlusion of cerebral artery ([place])   . Renal insufficiency   . Hyperlipemia   . Paroxysmal atrial fibrillation ([place]) 09/12/2014  . Migraine aura without headache 09/10/2013  . Peripheral vascular disease, unspecified ([place]) 10/05/2012  . Abdominal aneurysm without mention of rupture 10/05/2012  . 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 Benign hypertensive heart disease without heart failure 10/05/2012  . Pure hypercholesterolemia 10/05/2012  . Coronary atherosclerosis of native coronary artery 10/05/2012  . Atherosclerosis of native arteries of the extremities with intermittent claudication 10/05/2012  . Other and unspecified hyperlipidemia 10/05/2012  . Atherosclerosis of renal artery ([place]) 10/05/2012  . Other specified type of hydrocele 10/05/2012  . Other specified circulatory system disorders 10/05/2012  . Family history of malignant neoplasm of gastrointestinal tract 10/05/2012  . Bronchitis, not specified as acute or chronic 10/05/2012  . Hypertension   . Nocturia   . Arthritis   . History of AAA (abdominal aortic aneurysm) repair   . Asymptomatic carotid artery stenosis   . Status post primary angioplasty with coronary stent   . 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 Retinal detachment   . Increased intraocular pressure   . History of inguinal herniorrhaphy   . History  of diverticulosis   . 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 Occlusion and stenosis of carotid artery without mention of cerebral infarction 08/23/2011  . Hydrocele 01/24/2011  . Hydrocele, left 01/17/2011   Past Medical History  Diagnosis Date  . Hypertension   . Nocturia   . Hyperlipidemia   . Arthritis     HANDS AND FEET  . Cataract immature BILATERAL   . Hydrocele, left   . History of AAA (abdominal aortic aneurysm) repair 2000    W/ AORTOVIFEMORAL BYPASS  . Peripheral vascular disease ([place]) FOLLOWED BY DR EARLY-- VISIT NOTE 01-05-10 AND CAROTID / VASCULAR DOPPLER  RESULTS W/ CHART    ACTIVE WALKING PROGRAM  . Status post primary angioplasty with coronary stent 2002--  X2 BM STENTS  . 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 Retinal detachment   . Increased intraocular pressure   . History  of inguinal herniorrhaphy   . History of diverticulosis     Noted on Colonoscopy  . Coronary artery disease CARDIOLOGIST- DR Tressia Miners TURNER-- LAST VISIT NOV  2012-- WILL REQUEST NOTE AND STRESS TEST    s/p PCI 2000  . Asymptomatic carotid artery stenosis BILATERAL     PER DOPPLER  01-05-10   RICA  1 - [id number]   LICA  40 - [id number] - followed by Dr. Donnetta Hutching  . Diverticulosis   . H/O heart artery stent 02/19/00  . Paroxysmal atrial fibrillation ([place]) 06/30/14    chad2vasc score is at least 4  . Stroke [facility])       Medication List    STOP taking these medications        diltiazem 30 MG tablet  Commonly known as:  CARDIZEM     diltiazem 300 MG 24 hr tablet  Commonly known as:  CARDIZEM LA      TAKE these medications        apixaban 5 MG Tabs tablet  Commonly known as:  ELIQUIS  Take 1 tablet (5 mg total) by mouth 2 (two) times daily.     aspirin EC 81 MG tablet  Take 81 mg by mouth daily.     b complex vitamins tablet  Take 1 tablet by mouth daily.     calcium carbonate 600 MG Tabs tablet  Commonly known as:  OS-CAL  Take 600 mg by mouth daily.     Cholecalciferol 1000 units  capsule  Take 1,000 Units by mouth daily.     Co Q 10 100 MG Caps  Take 1 capsule by mouth daily.     EPIPEN 0.3 mg/0.3 mL Soaj injection  Generic drug:  EPINEPHrine  Inject 0.3 mg into the muscle daily as needed (allergic reaction).     finasteride 5 MG tablet  Commonly known as:  PROSCAR  Take 5 mg by mouth daily.     FISH OIL PO  Take 1,200 mg by mouth daily.     hydrochlorothiazide 25 MG tablet  Commonly known as:  HYDRODIURIL  Take 25 mg by mouth daily.     losartan 100 MG tablet  Commonly known as:  COZAAR  Take 100 mg by mouth daily.     MAGNESIA PO  Take 1,000 mg by mouth daily.     multivitamin tablet  Take 1 tablet by mouth daily.     oxyCODONE 5 MG immediate release tablet  Commonly known as:  ROXICODONE  Take 1 tablet (5 mg total) by mouth every 6 (six) hours as needed.     rosuvastatin 40 MG tablet  Commonly known as:  CRESTOR  Take 1 tablet (40 mg total) by mouth every evening.     tamsulosin 0.4 MG Caps capsule  Commonly known as:  FLOMAX  Take 0.4 mg by mouth daily.     vitamin C 500 MG tablet  Commonly known as:  ASCORBIC ACID  Take 2,000 mg by mouth daily.        Prescriptions given: Roxicodone #20 No Refill  Instructions: 1.  Shower daily with soap and water starting daily   Disposition: home  Patient's condition: is Good  Follow up: 1. Dr. Donnetta Hutching in 2 weeks.   Leontine Locket, PA-C [facility] [phone]  --- For [facility] use --- Instructions: Press F2 to tab through selections.  Delete question if not applicable.   Modified Rankin score at D/C (0-6): 0  IV medication needed for:  1. Hypertension: No 2. Hypotension: No  Post-op Complications: No  1. Post-op CVA or TIA: No  If yes: Event classification (right eye, left eye, right cortical, left cortical, verterobasilar, other): n/a  If yes: Timing of event (intra-op, <6 hrs post-op, >=6 hrs post-op, unknown): n/a  2. CN injury: No  If yes:  CN n/a injuried   3. Myocardial infarction: No  If yes: Dx by (EKG or clinical, Troponin): n/a  4.  CHF: No  5.  Dysrhythmia (new): No  6. Wound infection: No  7. Reperfusion symptoms: No  8. Return to OR: No  If yes: return to OR for (bleeding, neurologic, other CEA incision, other): n/a  Discharge medications: Statin use:  Yes If No: [ ]  For Medical reasons, [ ]  Non-compliant, [ ]  Not-indicated ASA use:  Yes  If No: [ ]  For Medical reasons, [ ]  Non-compliant, [ ]  Not-indicated Beta blocker use:  No If No: [ ]  For Medical reasons, [ ]  Non-compliant, [ ]  Not-indicated ACE-Inhibitor use:  No If No: [ ]  For Medical reasons, [ ]  Non-compliant, [ ]  Not-indicated ARB use:  Yes P2Y12 Antagonist use: No, [ ]  Plavix, [ ]  Plasugrel, [ ]  Ticlopinine, [ ]  Ticagrelor, [ ]  Other, [ ]  No for medical reason, [ ]  Non-compliant, [ ]  Not-indicated Anti-coagulant use:  Yes, [ ]  Warfarin, [ ]  Rivaroxaban, [ ]  Dabigatran, [ x] Other-Apixaban, [ ]  No for medical reason, [ ]  Non-compliant, [ ]  Not-indicated

## 2015-03-12 NOTE — Progress Notes (Signed)
Patient has been voiding adequately, minimal post void residual. Spoke with [place], [place] and patient ok to be discharged.

## 2015-03-13 ENCOUNTER — Telehealth: Payer: Self-pay | Admitting: Vascular Surgery

## 2015-03-13 NOTE — Telephone Encounter (Signed)
-----   Message from Mena Goes, RN sent at 03/11/2015 11:15 AM EST ----- Regarding: schedule   ----- Message -----    From: Gabriel Earing, PA-C    Sent: 03/11/2015  11:02 AM      To: [facility] Charge Pool  S/p left CEA 03/11/15.  F/u with Dr. Donnetta Hutching in 2 weeks.  Thanks

## 2015-03-13 NOTE — Telephone Encounter (Signed)
Spoke with pts wife to schedule, dpm °

## 2015-03-23 DIAGNOSIS — H2512 Age-related nuclear cataract, left eye: Secondary | ICD-10-CM | POA: Diagnosis not present

## 2015-03-23 DIAGNOSIS — Z961 Presence of intraocular lens: Secondary | ICD-10-CM | POA: Diagnosis not present

## 2015-03-23 DIAGNOSIS — H25812 Combined forms of age-related cataract, left eye: Secondary | ICD-10-CM | POA: Diagnosis not present

## 2015-03-23 DIAGNOSIS — Z9842 Cataract extraction status, left eye: Secondary | ICD-10-CM | POA: Diagnosis not present

## 2015-03-23 DIAGNOSIS — H5202 Hypermetropia, left eye: Secondary | ICD-10-CM | POA: Diagnosis not present

## 2015-03-23 DIAGNOSIS — H52222 Regular astigmatism, left eye: Secondary | ICD-10-CM | POA: Diagnosis not present

## 2015-03-24 ENCOUNTER — Encounter (HOSPITAL_COMMUNITY): Payer: Medicare Other

## 2015-03-24 ENCOUNTER — Ambulatory Visit: Payer: PPO | Admitting: Family

## 2015-03-27 ENCOUNTER — Encounter: Payer: Self-pay | Admitting: Vascular Surgery

## 2015-03-31 ENCOUNTER — Encounter: Payer: Self-pay | Admitting: Vascular Surgery

## 2015-03-31 ENCOUNTER — Ambulatory Visit (INDEPENDENT_AMBULATORY_CARE_PROVIDER_SITE_OTHER): Payer: Self-pay | Admitting: Vascular Surgery

## 2015-03-31 VITALS — BP 144/70 | HR 60 | Temp 97.1°F | Ht 73.0 in | Wt 196.9 lb

## 2015-03-31 DIAGNOSIS — I6523 Occlusion and stenosis of bilateral carotid arteries: Secondary | ICD-10-CM

## 2015-03-31 NOTE — Progress Notes (Signed)
   Patient name: [name] MRN: [id number] DOB: [birth year] Sex: male  REASON FOR VISIT: Hollow up of left carotid endarterectomy for symptomatic carotid disease on 03/11/2015  HPI: [name] is a 75 y.o. male here today for carotid follow-up. He did extremely well hospital was discharged home. He is back on his. He is actually had cataract surgery since his visit with me. He has had no wound issues and has had no neurologic deficits.  Current Outpatient Prescriptions  Medication Sig Dispense Refill  . apixaban (ELIQUIS) 5 MG TABS tablet Take 1 tablet (5 mg total) by mouth 2 (two) times daily. 60 tablet 6  . aspirin EC 81 MG tablet Take 81 mg by mouth daily.    [name] b complex vitamins tablet Take 1 tablet by mouth daily.      . calcium carbonate (OS-CAL) 600 MG TABS Take 600 mg by mouth daily.      . Cholecalciferol 1000 UNITS capsule Take 1,000 Units by mouth daily.    . Coenzyme Q10 (CO Q 10) 100 MG CAPS Take 1 capsule by mouth daily.    [name] diltiazem (TIAZAC) 300 MG 24 hr capsule Take 300 mg by mouth as needed.    [name] EPINEPHrine (EPIPEN) 0.3 mg/0.3 mL SOAJ injection Inject 0.3 mg into the muscle daily as needed (allergic reaction).     . finasteride (PROSCAR) 5 MG tablet Take 5 mg by mouth daily.    . hydrochlorothiazide (HYDRODIURIL) 25 MG tablet Take 25 mg by mouth daily.      [name] losartan (COZAAR) 100 MG tablet Take 100 mg by mouth daily.    . Magnesium Hydroxide (MAGNESIA PO) Take 1,000 mg by mouth daily.     . Multiple Vitamin (MULTIVITAMIN) tablet Take 1 tablet by mouth daily.      . Omega-3 Fatty Acids (FISH OIL PO) Take 1,200 mg by mouth daily.    [name] oxyCODONE (ROXICODONE) 5 MG immediate release tablet Take 1 tablet (5 mg total) by mouth every 6 (six) hours as needed. 20 tablet 0  . rosuvastatin (CRESTOR) 40 MG tablet Take 1 tablet (40 mg total) by mouth every evening. 90 tablet 3  . Tamsulosin HCl (FLOMAX) 0.4 MG CAPS Take 0.4 mg by mouth daily.      . vitamin C (ASCORBIC  ACID) 500 MG tablet Take 2,000 mg by mouth daily.     No current facility-administered medications for this visit.     PHYSICAL EXAM: Filed Vitals:   03/31/15 1058 03/31/15 1100  BP: 138/67 144/70  Pulse: 60   Temp: 97.1 F (36.2 C)   TempSrc: Oral   Height: 6\' 1"  (1.854 m)   Weight: 196 lb 14.4 oz (89.313 kg)   SpO2: 99%     GENERAL: The patient is a well-nourished male, in no acute distress. The vital signs are documented above. Neck incision is healed quite nicely with the no evidence of hematoma. Does have typical. Incisional numbness Grossly intact neurologically  MEDICAL ISSUES: Table status post left carotid endarterectomy for symptomatic carotid disease. He was in full activity without limitation. We'll see him again in 6 months with repeat carotid duplex. He'll notify us for any wound issues or neurologic changes.  Curt Jews [facility] [place]: [phone]

## 2015-03-31 NOTE — Addendum Note (Signed)
Addended by: Mena Goes on: 03/31/2015 01:08 PM   Modules accepted: Orders

## 2015-04-06 DIAGNOSIS — H52223 Regular astigmatism, bilateral: Secondary | ICD-10-CM | POA: Diagnosis not present

## 2015-04-06 DIAGNOSIS — H2511 Age-related nuclear cataract, right eye: Secondary | ICD-10-CM | POA: Diagnosis not present

## 2015-04-06 DIAGNOSIS — H25811 Combined forms of age-related cataract, right eye: Secondary | ICD-10-CM | POA: Diagnosis not present

## 2015-04-06 DIAGNOSIS — H5203 Hypermetropia, bilateral: Secondary | ICD-10-CM | POA: Diagnosis not present

## 2015-05-12 DIAGNOSIS — Z961 Presence of intraocular lens: Secondary | ICD-10-CM | POA: Diagnosis not present

## 2015-05-19 ENCOUNTER — Ambulatory Visit: Payer: PPO | Admitting: Neurology

## 2015-06-02 ENCOUNTER — Ambulatory Visit: Payer: PPO | Admitting: Neurology

## 2015-06-28 DIAGNOSIS — L03115 Cellulitis of right lower limb: Secondary | ICD-10-CM | POA: Diagnosis not present

## 2015-06-28 DIAGNOSIS — S80811A Abrasion, right lower leg, initial encounter: Secondary | ICD-10-CM | POA: Diagnosis not present

## 2015-06-28 DIAGNOSIS — S81811A Laceration without foreign body, right lower leg, initial encounter: Secondary | ICD-10-CM | POA: Diagnosis not present

## 2015-07-01 DIAGNOSIS — L814 Other melanin hyperpigmentation: Secondary | ICD-10-CM | POA: Diagnosis not present

## 2015-07-01 DIAGNOSIS — L570 Actinic keratosis: Secondary | ICD-10-CM | POA: Diagnosis not present

## 2015-07-03 DIAGNOSIS — M79606 Pain in leg, unspecified: Secondary | ICD-10-CM | POA: Diagnosis not present

## 2015-09-08 ENCOUNTER — Ambulatory Visit (INDEPENDENT_AMBULATORY_CARE_PROVIDER_SITE_OTHER): Payer: PPO | Admitting: Ophthalmology

## 2015-09-08 DIAGNOSIS — H338 Other retinal detachments: Secondary | ICD-10-CM | POA: Diagnosis not present

## 2015-09-08 DIAGNOSIS — H43813 Vitreous degeneration, bilateral: Secondary | ICD-10-CM | POA: Diagnosis not present

## 2015-09-08 DIAGNOSIS — H33302 Unspecified retinal break, left eye: Secondary | ICD-10-CM | POA: Diagnosis not present

## 2015-09-08 DIAGNOSIS — H35033 Hypertensive retinopathy, bilateral: Secondary | ICD-10-CM | POA: Diagnosis not present

## 2015-09-08 DIAGNOSIS — I10 Essential (primary) hypertension: Secondary | ICD-10-CM

## 2015-09-22 ENCOUNTER — Encounter: Payer: Self-pay | Admitting: Cardiology

## 2015-09-30 DIAGNOSIS — Z23 Encounter for immunization: Secondary | ICD-10-CM | POA: Diagnosis not present

## 2015-10-01 ENCOUNTER — Encounter: Payer: Self-pay | Admitting: Vascular Surgery

## 2015-10-06 ENCOUNTER — Ambulatory Visit (HOSPITAL_COMMUNITY)
Admission: RE | Admit: 2015-10-06 | Discharge: 2015-10-06 | Disposition: A | Discharge status: Home / Self Care | Payer: PPO | Source: Ambulatory Visit | Attending: Vascular Surgery | Admitting: Vascular Surgery

## 2015-10-06 ENCOUNTER — Ambulatory Visit (INDEPENDENT_AMBULATORY_CARE_PROVIDER_SITE_OTHER): Payer: PPO | Admitting: Vascular Surgery

## 2015-10-06 ENCOUNTER — Encounter: Payer: Self-pay | Admitting: Vascular Surgery

## 2015-10-06 ENCOUNTER — Telehealth: Payer: Self-pay | Admitting: Cardiology

## 2015-10-06 VITALS — BP 138/72 | HR 48 | Temp 98.6°F | Resp 18 | Ht 72.0 in | Wt 193.5 lb

## 2015-10-06 DIAGNOSIS — I6523 Occlusion and stenosis of bilateral carotid arteries: Secondary | ICD-10-CM | POA: Insufficient documentation

## 2015-10-06 LAB — VAS US CAROTID
LEFT ECA DIAS: -8 cm/s
LEFT VERTEBRAL DIAS: 9 cm/s
LICADSYS: -90 cm/s
Left CCA prox dias: 16 cm/s
Left CCA prox sys: 95 cm/s
Left ICA dist dias: -18 cm/s
RCCAPSYS: 108 cm/s
RIGHT CCA MID DIAS: 14 cm/s
RIGHT ECA DIAS: -5 cm/s
RIGHT VERTEBRAL DIAS: 10 cm/s
Right CCA prox dias: 11 cm/s
Right cca dist sys: -87 cm/s

## 2015-10-06 NOTE — Telephone Encounter (Signed)
Pt unsure of whether he was getting labs tomorrow at appt with Dr. Radford Pax.  Pt concerned about fasting until 1:45pm.  Advised pt Lipids were checked 02/2015 and they were fine so he will probably not have fasting labs again tomorrow.  Advised pt not to worry about fasting tomorrow and we can schedule labs for another day if Dr. Radford Pax decides to have them drawn.  Pt verbalized understanding and was appreciative for help.

## 2015-10-06 NOTE — Progress Notes (Signed)
Patient name: [name] MRN: [id number] DOB: 09-Apr-1940 Sex: male  REASON FOR VISIT: Follow-up carotid disease  HPI: [name] is a 75 y.o. male here for follow-up of carotid disease status post left carotid endarterectomy in February 2017. He is returned to his usual activities with no neurologic deficits. Also status post open aneurysm repair in the past as well. Remains quite active.  Past Medical History:  Diagnosis Date  . Arthritis    HANDS AND FEET  . Asymptomatic carotid artery stenosis BILATERAL    PER DOPPLER  01-05-10   RICA  1 - [id number]   LICA  40 - [id number] - followed by Dr. Donnetta Hutching  . Cataract immature BILATERAL   . Coronary artery disease CARDIOLOGIST- DR Tressia Miners [place]-- LAST VISIT NOV  2012-- WILL REQUEST NOTE AND STRESS TEST   s/p PCI 2000  . Diverticulosis   . H/O heart artery stent 02/19/00  . 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 History of diverticulosis    Noted on Colonoscopy  . History of inguinal herniorrhaphy   . Hydrocele, left   . Hyperlipidemia   . Hypertension   . Increased intraocular pressure   . Nocturia   . Paroxysmal atrial fibrillation ([place]) 06/30/14   chad2vasc score is at least 4  . Peripheral vascular disease ([place]) FOLLOWED BY DR Swetha Rayle-- VISIT NOTE 01-05-10 AND CAROTID / VASCULAR DOPPLER  RESULTS W/ CHART   ACTIVE WALKING PROGRAM  . 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 Retinal detachment   . Status post primary angioplasty with coronary stent 2002--  X2 BM STENTS  . Stroke [facility])     Family History  Problem Relation Age of Onset  . Heart disease Mother     AAA  Before age 11  . Cancer Mother 38    Colon cancer  . Hyperlipidemia Mother   . Heart failure Father     SOCIAL HISTORY: Social History  Substance Use Topics  . Smoking status: Former Smoker    Years: 30.00    Types: Cigarettes    Quit date: 01/18/1978  . Smokeless tobacco: Never Used  . Alcohol use 13.2 oz/week   1 Shots of liquor, 21 Standard drinks or equivalent per week     Comment: 2 oz of vodka per day    Allergies  Allergen Reactions  . Wasp Venom Shortness Of Breath and Swelling    Current Outpatient Prescriptions  Medication Sig Dispense Refill  . apixaban (ELIQUIS) 5 MG TABS tablet Take 1 tablet (5 mg total) by mouth 2 (two) times daily. 60 tablet 6  . aspirin EC 81 MG tablet Take 81 mg by mouth daily.    [name] b complex vitamins tablet Take 1 tablet by mouth daily.      . calcium carbonate (OS-CAL) 600 MG TABS Take 600 mg by mouth daily.      . Cholecalciferol 1000 UNITS capsule Take 2,000 Units by mouth daily.     . Coenzyme Q10 (CO Q 10) 100 MG CAPS Take 1 capsule by mouth daily.    . cyanocobalamin 1000 MCG tablet Take 1,000 mcg by mouth daily.    [name] diltiazem (TIAZAC) 300 MG 24 hr capsule Take 300 mg by mouth as needed.    [name] EPINEPHrine (EPIPEN) 0.3 mg/0.3 mL SOAJ injection Inject 0.3 mg into the muscle daily as needed (allergic reaction).     . finasteride (PROSCAR) 5 MG tablet Take 5 mg  by mouth daily.    . hydrochlorothiazide (HYDRODIURIL) 25 MG tablet Take 25 mg by mouth daily.      [name] losartan (COZAAR) 100 MG tablet Take 100 mg by mouth daily.    . Magnesium Hydroxide (MAGNESIA PO) Take 1,000 mg by mouth daily.     . Multiple Vitamin (MULTIVITAMIN) tablet Take 1 tablet by mouth daily.      . niacinamide 500 MG tablet Take 500 mg by mouth daily.    . Omega-3 Fatty Acids (FISH OIL PO) Take 1,200 mg by mouth daily.    [name] oxyCODONE (ROXICODONE) 5 MG immediate release tablet Take 1 tablet (5 mg total) by mouth every 6 (six) hours as needed. 20 tablet 0  . potassium gluconate 595 (99 K) MG TABS tablet Take 595 mg by mouth.    . rosuvastatin (CRESTOR) 40 MG tablet Take 1 tablet (40 mg total) by mouth every evening. 90 tablet 3  . Tamsulosin HCl (FLOMAX) 0.4 MG CAPS Take 0.4 mg by mouth daily.      . vitamin C (ASCORBIC ACID) 500 MG tablet Take 2,000 mg by mouth daily.     No current  facility-administered medications for this visit.     REVIEW OF SYSTEMS:  [X]  denotes positive finding, [ ]  denotes negative finding Cardiac  Comments:  Chest pain or chest pressure:     Shortness of breath upon exertion:    Short of breath when lying flat:    Irregular heart rhythm:        Vascular    Pain in calf, thigh, or hip brought on by ambulation:    Pain in feet at night that wakes you up from your sleep:     Blood clot in your veins:    Leg swelling:         Pulmonary    Oxygen at home:    Productive cough:     Wheezing:         Neurologic    Sudden weakness in arms or legs:     Sudden numbness in arms or legs:     Sudden onset of difficulty speaking or slurred speech:    Temporary loss of vision in one eye:     Problems with dizziness:         Gastrointestinal    Blood in stool:     Vomited blood:         Genitourinary    Burning when urinating:     Blood in urine:        Psychiatric    Major depression:         Hematologic    Bleeding problems:    Problems with blood clotting too easily:        Skin    Rashes or ulcers:        Constitutional    Fever or chills:      PHYSICAL EXAM: Vitals:   10/06/15 1233 10/06/15 1236  BP: 138/79 138/72  Pulse: (!) 48   Resp: 18   Temp: 98.6 F (37 C)   TempSrc: Oral   SpO2: 99%   Weight: 193 lb 8 oz (87.8 kg)   Height: 6' (1.829 m)     GENERAL: The patient is a well-nourished male, in no acute distress. The vital signs are documented above. VASCULAR: 2+ radial pulses bilaterally. Left carotid incision is well-healed with no bruits bilaterally PULMONARY: There is good air exchange  MUSCULOSKELETAL: There are no major deformities or  cyanosis. NEUROLOGIC: No focal weakness or paresthesias are detected. SKIN: There are no ulcers or rashes noted. PSYCHIATRIC: The patient has a normal affect.  DATA:   Carotid duplex today was reviewed with patient. This shows a widely patent endarterectomy with no  significant stenosis his right carotid is in the 40-59% stenosis range  MEDICAL ISSUES:  Stable overall. I will notify should he develop any neurologic deficits. Otherwise we'll see him in 6 months with repeat carotid duplex. Assuming stable findings at that time would drop back to yearly carotid duplex    Ahana Najera [facility] [employer] [phone]

## 2015-10-06 NOTE — Telephone Encounter (Signed)
New message     Pt calling b/c he wants to know about whether he should fast for his blood work on tomorrow. Please call.

## 2015-10-06 NOTE — Addendum Note (Signed)
Addended by: Kaleen Mask on: 10/06/2015 04:09 PM   Modules accepted: Orders

## 2015-10-07 ENCOUNTER — Ambulatory Visit (INDEPENDENT_AMBULATORY_CARE_PROVIDER_SITE_OTHER): Payer: PPO | Admitting: Cardiology

## 2015-10-07 ENCOUNTER — Encounter: Payer: Self-pay | Admitting: Cardiology

## 2015-10-07 VITALS — BP 124/62 | HR 58 | Ht 72.0 in | Wt 190.1 lb

## 2015-10-07 DIAGNOSIS — E7800 Pure hypercholesterolemia, unspecified: Secondary | ICD-10-CM | POA: Diagnosis not present

## 2015-10-07 DIAGNOSIS — I480 Paroxysmal atrial fibrillation: Secondary | ICD-10-CM

## 2015-10-07 DIAGNOSIS — I10 Essential (primary) hypertension: Secondary | ICD-10-CM

## 2015-10-07 DIAGNOSIS — I6523 Occlusion and stenosis of bilateral carotid arteries: Secondary | ICD-10-CM

## 2015-10-07 DIAGNOSIS — I2510 Atherosclerotic heart disease of native coronary artery without angina pectoris: Secondary | ICD-10-CM

## 2015-10-07 NOTE — Patient Instructions (Addendum)
Your physician recommends that you continue on your current medications as directed. Please refer to the Current Medication list given to you today. Your physician recommends that you return for lab work in:  [place] wants you to follow-up in: [place] will receive a reminder letter in the mail two months in advance. If you don't receive a letter, please call our office to schedule the follow-up appointment.

## 2015-10-07 NOTE — Progress Notes (Signed)
Cardiology Office Note    Date:  10/07/2015   ID:  [name], DOB 23-Sep-1940, MRN [id number]  PCP:  Henrine Screws, MD  Cardiologist:  Fransico Him, MD   Chief Complaint  Patient presents with  . Coronary Artery Disease  . Hypertension  . Atrial Fibrillation  . Hyperlipidemia    History of Present Illness:  [name] is a 75 y.o. male with a history of CAD/HTN/dyslipidemia and PAF on chronic anticoagulation with Eliquis who presents today for followup. Since I saw him last he had a TIA and is now on ASA in addition to Eliquis.  He underwent L CEA.  He is doing well. He denies any chest pain, SOB, DOE, LE edema, dizziness or syncope. He says that when he gets up to urinate and occasionally will notice his heart racing when he goes back to bed. If he sits on the bed for a few minutes before he gets up it does not happen.    Past Medical History:  Diagnosis Date  . Arthritis    HANDS AND FEET  . Asymptomatic carotid artery stenosis BILATERAL    PER DOPPLER  01-05-10   RICA  1 - [id number]   LICA  40 - [id number] - followed by Dr. Donnetta Hutching  . CAD (coronary artery disease) 2002--  X2 BM STENTS  . Cataract immature BILATERAL   . Diverticulosis   . 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 History of diverticulosis    Noted on Colonoscopy  . History of inguinal herniorrhaphy   . Hydrocele, left   . Hyperlipidemia   . Hypertension   . Increased intraocular pressure   . Nocturia   . Paroxysmal atrial fibrillation ([place]) 06/30/14   chad2vasc score is at least 4  . Peripheral vascular disease ([place]) FOLLOWED BY DR EARLY-- VISIT NOTE 01-05-10 AND CAROTID / VASCULAR DOPPLER  RESULTS W/ CHART   ACTIVE WALKING PROGRAM  . 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 Retinal detachment   . Stroke [facility])     Past Surgical History:  Procedure Laterality Date  . AAA REPAIR W/ AORTOBIFEMORAL BYPASS  OCT  09811  . ABDOMINAL HERNIA REPAIR  X4   (LAST ONE 1990'S)   inguinal herniorrhaphy  . CARPAL TUNNEL RELEASE Right Oct. 2013   Hand  . CORONARY ANGIOPLASTY  2002   PTCA  W/ X2 BM STENTS--   PROXIMA LAD  &  LEFT CIRCUMFLEX TO FIRST OBTUSE MARGINAL BRANCH  . ENDARTERECTOMY Left 03/11/2015   Procedure: ENDARTERECTOMY LEFT CAROTID;  Surgeon: Rosetta Posner, MD;  Location: [facility];  Service: Vascular;  Laterality: Left;  . EXPLORATORY LAPAROTOMY W/ ENTEROLYSIS FOR PARTIAL SBO  09-23-2008  . heart stents  02/19/00  . HYDROCELE EXCISION  01/24/2011   Procedure: HYDROCELECTOMY ADULT;  Surgeon: Bernestine Amass, MD;  Location: [facility];  Service: Urology;  Laterality: Left;  . Ingrown Toe Nail Right Jan. 2015   Right Great Toe nail  . intest blockage  09/22/08  . PATCH ANGIOPLASTY Left 03/11/2015   Procedure: PATCH ANGIOPLASTY LEFT CAROTID USING HEMASHIELD PLATINUM FINESSE PATCH;  Surgeon: Rosetta Posner, MD;  Location: [facility];  Service: Vascular;  Laterality: Left;  . PULLEY RELEASE -- RIGHT 5TH FINGER  2009  . RETINAL DETACHMENT SURGERY  2004   BILATERAL  . TOOTH EXTRACTION  Dec 2013    Current Medications: Outpatient Medications Prior to Visit  Medication Sig Dispense  Refill  . apixaban (ELIQUIS) 5 MG TABS tablet Take 1 tablet (5 mg total) by mouth 2 (two) times daily. 60 tablet 6  . aspirin EC 81 MG tablet Take 81 mg by mouth daily.    [name] b complex vitamins tablet Take 1 tablet by mouth daily.      . calcium carbonate (OS-CAL) 600 MG TABS Take 600 mg by mouth daily.      . Cholecalciferol 1000 UNITS capsule Take 2,000 Units by mouth daily.     . Coenzyme Q10 (CO Q 10) 100 MG CAPS Take 1 capsule by mouth daily.    . cyanocobalamin 1000 MCG tablet Take 1,000 mcg by mouth daily.    [name] diltiazem (TIAZAC) 300 MG 24 hr capsule Take 300 mg by mouth as needed.    [name] EPINEPHrine (EPIPEN) 0.3 mg/0.3 mL SOAJ injection Inject 0.3 mg into the muscle daily as needed (allergic reaction).     . finasteride (PROSCAR) 5 MG tablet Take 5 mg by  mouth daily.    . hydrochlorothiazide (HYDRODIURIL) 25 MG tablet Take 25 mg by mouth daily.      [name] losartan (COZAAR) 100 MG tablet Take 100 mg by mouth daily.    . Magnesium Hydroxide (MAGNESIA PO) Take 1,000 mg by mouth daily.     . Multiple Vitamin (MULTIVITAMIN) tablet Take 1 tablet by mouth daily.      . niacinamide 500 MG tablet Take 500 mg by mouth daily.    . Omega-3 Fatty Acids (FISH OIL PO) Take 1,200 mg by mouth daily.    [name] oxyCODONE (ROXICODONE) 5 MG immediate release tablet Take 1 tablet (5 mg total) by mouth every 6 (six) hours as needed. 20 tablet 0  . potassium gluconate 595 (99 K) MG TABS tablet Take 595 mg by mouth.    . rosuvastatin (CRESTOR) 40 MG tablet Take 1 tablet (40 mg total) by mouth every evening. 90 tablet 3  . Tamsulosin HCl (FLOMAX) 0.4 MG CAPS Take 0.4 mg by mouth daily.      . vitamin C (ASCORBIC ACID) 500 MG tablet Take 2,000 mg by mouth daily.     No facility-administered medications prior to visit.      Allergies:   Wasp venom   Social History   Social History  . Marital status: Married    Spouse name: N/A  . Number of children: N/A  . Years of education: N/A   Social History Main Topics  . Smoking status: Former Smoker    Years: 30.00    Types: Cigarettes    Quit date: 01/18/1978  . Smokeless tobacco: Never Used  . Alcohol use 13.2 oz/week    1 Shots of liquor, 21 Standard drinks or equivalent per week     Comment: 2 oz of vodka per day  . Drug use: No  . Sexual activity: Not Asked   Other Topics Concern  . None   Social History Narrative   Pt lives in [place] with spouse.  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     Family History:  The patient's family history includes Cancer (age of onset: 64) in his mother; Heart disease in his mother; Heart failure in his father; Hyperlipidemia in his mother.   ROS:   Please see the history of present illness.    ROS All other systems reviewed and are negative.  No  flowsheet data found.     PHYSICAL EXAM:   VS:  BP 124/62  Pulse (!) 58   Ht 6' (1.829 m)   Wt 190 lb 1.9 oz (86.2 kg)   SpO2 98%   BMI 25.78 kg/m    GEN: Well nourished, well developed, in no acute distress  HEENT: normal  Neck: no JVD, carotid bruits, or masses Cardiac: RRR; no murmurs, rubs, or gallops,no edema. Trace DP pulses Respiratory:  clear to auscultation bilaterally, normal work of breathing GI: soft, nontender, nondistended, + BS MS: no deformity or atrophy  Skin: warm and dry, no rash Neuro:  Alert and Oriented x 3, Strength and sensation are intact Psych: euthymic mood, full affect  Wt Readings from Last 3 Encounters:  10/07/15 190 lb 1.9 oz (86.2 kg)  10/06/15 193 lb 8 oz (87.8 kg)  03/31/15 196 lb 14.4 oz (89.3 kg)      Studies/Labs Reviewed:   EKG:  EKG is not ordered today.    Recent Labs: 03/10/2015: ALT 24 03/12/2015: BUN 14; Creatinine, Ser 0.78; Hemoglobin 11.5; Platelets 166; Potassium 3.6; Sodium 134   Lipid Panel    Component Value Date/Time   CHOL 147 02/22/2015 0537   CHOL 164 05/24/2013 0916   TRIG 26 02/22/2015 0537   TRIG 50 05/24/2013 0916   HDL 77 02/22/2015 0537   HDL 84 05/24/2013 0916   CHOLHDL 1.9 02/22/2015 0537   VLDL 5 02/22/2015 0537   LDLCALC 65 02/22/2015 0537   LDLCALC 70 05/24/2013 0916    Additional studies/ records that were reviewed today include:  noneDictation #1 [id number]  [id number]     ASSESSMENT:    1. Paroxysmal atrial fibrillation (HCC)   2. Essential hypertension   3. Atherosclerosis of native coronary artery of native heart without angina pectoris   4. Pure hypercholesterolemia      PLAN:  In order of problems listed above:  1. PAF maintaining NSR.  Continue Apixaban/Cardizem. 2. HTN - BP controlled on current meds.  Continue CCB/diuretic/ARB. 3. ASCAD s/p PCI x 2 with no recent angina.  Continue statin.   4. Hyperlipidemia - LDL goal < 70.  Continue statin. Check FLP and  ALT. 5. Bilateral carotid artery stenosis s/p recent  L CEA with 40-59% right carotid stenosis and <40% left on dopplers yesterday  Continue statin.  No ASA due to Eliquis.      Medication Adjustments/Labs and Tests Ordered: Current medicines are reviewed at length with the patient today.  Concerns regarding medicines are outlined above.  Medication changes, Labs and Tests ordered today are listed in the Patient Instructions below.  Patient Instructions  Your physician recommends that you continue on your current medications as directed. Please refer to the Current Medication list given to you today.   Your physician wants you to follow-up in: [place] will receive a reminder letter in the mail two months in advance. If you don't receive a letter, please call our office to schedule the follow-up appointment.    Signed, Fransico Him, MD  10/07/2015 2:23 PM    [facility] [address], [place], [place]  [address] Phone: [phone]; Fax: [phone]

## 2015-10-14 ENCOUNTER — Other Ambulatory Visit: Payer: PPO | Admitting: *Deleted

## 2015-10-14 DIAGNOSIS — I480 Paroxysmal atrial fibrillation: Secondary | ICD-10-CM

## 2015-10-14 DIAGNOSIS — E7800 Pure hypercholesterolemia, unspecified: Secondary | ICD-10-CM

## 2015-10-14 LAB — HEPATIC FUNCTION PANEL
ALBUMIN: 4.1 g/dL (ref 3.6–5.1)
ALK PHOS: 34 U/L — AB (ref 40–115)
ALT: 20 U/L (ref 9–46)
AST: 28 U/L (ref 10–35)
Bilirubin, Direct: 0.1 mg/dL (ref <=0.2)
Indirect Bilirubin: 0.6 mg/dL (ref 0.2–1.2)
TOTAL PROTEIN: 6.7 g/dL (ref 6.1–8.1)
Total Bilirubin: 0.7 mg/dL (ref 0.2–1.2)

## 2015-10-14 LAB — BASIC METABOLIC PANEL
BUN: 17 mg/dL (ref 7–25)
CHLORIDE: 100 mmol/L (ref 98–110)
CO2: 27 mmol/L (ref 20–31)
Calcium: 9.5 mg/dL (ref 8.6–10.3)
Creat: 0.88 mg/dL (ref 0.70–1.18)
Glucose, Bld: 95 mg/dL (ref 65–99)
POTASSIUM: 4 mmol/L (ref 3.5–5.3)
Sodium: 137 mmol/L (ref 135–146)

## 2015-10-14 LAB — LIPID PANEL
Cholesterol: 195 mg/dL (ref 125–200)
HDL: 73 mg/dL (ref >=40)
LDL CALC: 108 mg/dL (ref <130)
TRIGLYCERIDES: 72 mg/dL (ref <150)
Total CHOL/HDL Ratio: 2.7 Ratio (ref <=5.0)
VLDL: 14 mg/dL (ref <30)

## 2015-10-19 ENCOUNTER — Telehealth: Payer: Self-pay

## 2015-10-19 DIAGNOSIS — E7800 Pure hypercholesterolemia, unspecified: Secondary | ICD-10-CM

## 2015-10-19 NOTE — Telephone Encounter (Signed)
Informed patient of results and verbal understanding expressed.  Patient states he has eaten poorly and refuses to start another medication at this time. He states he will watch his diet and exercise more and will repeat labs in 4 months. Recall placed for repeat labs.

## 2015-10-19 NOTE — Telephone Encounter (Signed)
-----   Message from Sueanne Margarita, MD sent at 10/14/2015  9:49 PM EDT ----- LDL not at goal - add zetia 10mg  daily and repeat FLP and ALT in 8 weeks

## 2015-10-21 DIAGNOSIS — M159 Polyosteoarthritis, unspecified: Secondary | ICD-10-CM | POA: Diagnosis not present

## 2015-11-03 DIAGNOSIS — M159 Polyosteoarthritis, unspecified: Secondary | ICD-10-CM | POA: Diagnosis not present

## 2015-12-08 DIAGNOSIS — M25562 Pain in left knee: Secondary | ICD-10-CM | POA: Diagnosis not present

## 2015-12-15 ENCOUNTER — Inpatient Hospital Stay (HOSPITAL_COMMUNITY)
Admission: EM | Admit: 2015-12-15 | Discharge: 2015-12-22 | DRG: 234 | Disposition: A | Discharge status: Home / Self Care | Payer: PPO | Attending: Cardiothoracic Surgery | Admitting: Cardiothoracic Surgery

## 2015-12-15 ENCOUNTER — Emergency Department (HOSPITAL_COMMUNITY): Payer: PPO

## 2015-12-15 ENCOUNTER — Encounter (HOSPITAL_COMMUNITY): Payer: Self-pay | Admitting: Emergency Medicine

## 2015-12-15 DIAGNOSIS — I110 Hypertensive heart disease with heart failure: Secondary | ICD-10-CM | POA: Diagnosis not present

## 2015-12-15 DIAGNOSIS — I2510 Atherosclerotic heart disease of native coronary artery without angina pectoris: Secondary | ICD-10-CM | POA: Diagnosis present

## 2015-12-15 DIAGNOSIS — I480 Paroxysmal atrial fibrillation: Secondary | ICD-10-CM | POA: Diagnosis not present

## 2015-12-15 DIAGNOSIS — K5790 Diverticulosis of intestine, part unspecified, without perforation or abscess without bleeding: Secondary | ICD-10-CM | POA: Diagnosis not present

## 2015-12-15 DIAGNOSIS — R001 Bradycardia, unspecified: Secondary | ICD-10-CM | POA: Diagnosis not present

## 2015-12-15 DIAGNOSIS — Z01810 Encounter for preprocedural cardiovascular examination: Secondary | ICD-10-CM | POA: Diagnosis not present

## 2015-12-15 DIAGNOSIS — Z7982 Long term (current) use of aspirin: Secondary | ICD-10-CM

## 2015-12-15 DIAGNOSIS — Q211 Atrial septal defect: Secondary | ICD-10-CM

## 2015-12-15 DIAGNOSIS — I10 Essential (primary) hypertension: Secondary | ICD-10-CM | POA: Diagnosis not present

## 2015-12-15 DIAGNOSIS — Z87891 Personal history of nicotine dependence: Secondary | ICD-10-CM

## 2015-12-15 DIAGNOSIS — I482 Chronic atrial fibrillation: Secondary | ICD-10-CM | POA: Diagnosis not present

## 2015-12-15 DIAGNOSIS — D62 Acute posthemorrhagic anemia: Secondary | ICD-10-CM | POA: Diagnosis not present

## 2015-12-15 DIAGNOSIS — Z8249 Family history of ischemic heart disease and other diseases of the circulatory system: Secondary | ICD-10-CM

## 2015-12-15 DIAGNOSIS — I209 Angina pectoris, unspecified: Secondary | ICD-10-CM | POA: Diagnosis not present

## 2015-12-15 DIAGNOSIS — E8770 Fluid overload, unspecified: Secondary | ICD-10-CM | POA: Diagnosis not present

## 2015-12-15 DIAGNOSIS — I25110 Atherosclerotic heart disease of native coronary artery with unstable angina pectoris: Secondary | ICD-10-CM

## 2015-12-15 DIAGNOSIS — I481 Persistent atrial fibrillation: Secondary | ICD-10-CM | POA: Diagnosis not present

## 2015-12-15 DIAGNOSIS — Z7901 Long term (current) use of anticoagulants: Secondary | ICD-10-CM | POA: Diagnosis not present

## 2015-12-15 DIAGNOSIS — I200 Unstable angina: Secondary | ICD-10-CM

## 2015-12-15 DIAGNOSIS — I253 Aneurysm of heart: Secondary | ICD-10-CM | POA: Diagnosis not present

## 2015-12-15 DIAGNOSIS — Z800 Family history of malignant neoplasm of digestive organs: Secondary | ICD-10-CM | POA: Diagnosis not present

## 2015-12-15 DIAGNOSIS — J9811 Atelectasis: Secondary | ICD-10-CM | POA: Diagnosis not present

## 2015-12-15 DIAGNOSIS — I214 Non-ST elevation (NSTEMI) myocardial infarction: Secondary | ICD-10-CM | POA: Diagnosis not present

## 2015-12-15 DIAGNOSIS — Z91038 Other insect allergy status: Secondary | ICD-10-CM

## 2015-12-15 DIAGNOSIS — M79601 Pain in right arm: Secondary | ICD-10-CM | POA: Diagnosis not present

## 2015-12-15 DIAGNOSIS — R531 Weakness: Secondary | ICD-10-CM | POA: Diagnosis not present

## 2015-12-15 DIAGNOSIS — I700 Atherosclerosis of aorta: Secondary | ICD-10-CM | POA: Diagnosis not present

## 2015-12-15 DIAGNOSIS — I119 Hypertensive heart disease without heart failure: Secondary | ICD-10-CM | POA: Diagnosis not present

## 2015-12-15 DIAGNOSIS — I739 Peripheral vascular disease, unspecified: Secondary | ICD-10-CM | POA: Diagnosis not present

## 2015-12-15 DIAGNOSIS — Z79899 Other long term (current) drug therapy: Secondary | ICD-10-CM

## 2015-12-15 DIAGNOSIS — E7800 Pure hypercholesterolemia, unspecified: Secondary | ICD-10-CM | POA: Diagnosis present

## 2015-12-15 DIAGNOSIS — I6523 Occlusion and stenosis of bilateral carotid arteries: Secondary | ICD-10-CM | POA: Diagnosis present

## 2015-12-15 DIAGNOSIS — Z955 Presence of coronary angioplasty implant and graft: Secondary | ICD-10-CM

## 2015-12-15 DIAGNOSIS — Z8673 Personal history of transient ischemic attack (TIA), and cerebral infarction without residual deficits: Secondary | ICD-10-CM | POA: Diagnosis not present

## 2015-12-15 DIAGNOSIS — M79602 Pain in left arm: Secondary | ICD-10-CM | POA: Diagnosis not present

## 2015-12-15 DIAGNOSIS — Z951 Presence of aortocoronary bypass graft: Secondary | ICD-10-CM

## 2015-12-15 DIAGNOSIS — E782 Mixed hyperlipidemia: Secondary | ICD-10-CM | POA: Diagnosis not present

## 2015-12-15 DIAGNOSIS — E785 Hyperlipidemia, unspecified: Secondary | ICD-10-CM | POA: Diagnosis not present

## 2015-12-15 DIAGNOSIS — Z808 Family history of malignant neoplasm of other organs or systems: Secondary | ICD-10-CM

## 2015-12-15 DIAGNOSIS — R404 Transient alteration of awareness: Secondary | ICD-10-CM | POA: Diagnosis not present

## 2015-12-15 LAB — BASIC METABOLIC PANEL
Anion gap: 8 (ref 5–15)
BUN: 16 mg/dL (ref 6–20)
CHLORIDE: 102 mmol/L (ref 101–111)
CO2: 27 mmol/L (ref 22–32)
CREATININE: 0.99 mg/dL (ref 0.61–1.24)
Calcium: 9.2 mg/dL (ref 8.9–10.3)
GFR calc Af Amer: >60 mL/min (ref >60)
GFR calc non Af Amer: >60 mL/min (ref >60)
Glucose, Bld: 105 mg/dL — ABNORMAL HIGH (ref 65–99)
POTASSIUM: 3.9 mmol/L (ref 3.5–5.1)
SODIUM: 137 mmol/L (ref 135–145)

## 2015-12-15 LAB — CBC
HEMATOCRIT: 38.5 % — AB (ref 39.0–52.0)
HEMOGLOBIN: 13.3 g/dL (ref 13.0–17.0)
MCH: 33.6 pg (ref 26.0–34.0)
MCHC: 34.5 g/dL (ref 30.0–36.0)
MCV: 97.2 fL (ref 78.0–100.0)
Platelets: 214 10*3/uL (ref 150–400)
RBC: 3.96 MIL/uL — AB (ref 4.22–5.81)
RDW: 12.8 % (ref 11.5–15.5)
WBC: 6.6 10*3/uL (ref 4.0–10.5)

## 2015-12-15 LAB — I-STAT TROPONIN, ED
TROPONIN I, POC: 0.01 ng/mL (ref 0.00–0.08)
Troponin i, poc: 0.26 ng/mL (ref 0.00–0.08)

## 2015-12-15 LAB — TROPONIN I: TROPONIN I: 0.92 ng/mL — AB (ref <0.03)

## 2015-12-15 MED ORDER — SODIUM CHLORIDE 0.9 % WEIGHT BASED INFUSION
3.0000 mL/kg/h | INTRAVENOUS | Status: DC
  Administered 2015-12-16: 3 mL/kg/h via INTRAVENOUS

## 2015-12-15 MED ORDER — SODIUM CHLORIDE 0.9 % IV SOLN: 250.0000 mL | INTRAVENOUS | Status: DC | PRN

## 2015-12-15 MED ORDER — ASPIRIN 81 MG PO CHEW
81.0000 mg | CHEWABLE_TABLET | ORAL | Status: AC
  Administered 2015-12-16: 81 mg via ORAL
  Filled 2015-12-15: qty 1

## 2015-12-15 MED ORDER — ACETAMINOPHEN 325 MG PO TABS
650.0000 mg | ORAL_TABLET | Freq: Once | ORAL | Status: AC
  Administered 2015-12-15: 650 mg via ORAL
  Filled 2015-12-15: qty 2

## 2015-12-15 MED ORDER — TAMSULOSIN HCL 0.4 MG PO CAPS
0.4000 mg | ORAL_CAPSULE | Freq: Every day | ORAL | Status: DC
  Administered 2015-12-16 – 2015-12-22 (×6): 0.4 mg via ORAL
  Filled 2015-12-15 (×6): qty 1

## 2015-12-15 MED ORDER — FINASTERIDE 5 MG PO TABS
5.0000 mg | ORAL_TABLET | Freq: Every day | ORAL | Status: DC
Start: 2015-12-15 — End: 2015-12-22
  Administered 2015-12-16 – 2015-12-22 (×6): 5 mg via ORAL
  Filled 2015-12-15 (×6): qty 1

## 2015-12-15 MED ORDER — ASPIRIN EC 81 MG PO TBEC
81.0000 mg | DELAYED_RELEASE_TABLET | Freq: Every day | ORAL | Status: DC
  Administered 2015-12-17: 81 mg via ORAL
  Filled 2015-12-15: qty 1

## 2015-12-15 MED ORDER — ROSUVASTATIN CALCIUM 10 MG PO TABS
40.0000 mg | ORAL_TABLET | Freq: Every evening | ORAL | Status: DC
Start: 2015-12-15 — End: 2015-12-22
  Administered 2015-12-16 – 2015-12-21 (×6): 40 mg via ORAL
  Filled 2015-12-15: qty 2
  Filled 2015-12-15: qty 4
  Filled 2015-12-15: qty 2
  Filled 2015-12-15 (×3): qty 4

## 2015-12-15 MED ORDER — ACETAMINOPHEN 325 MG PO TABS: 650.0000 mg | ORAL_TABLET | ORAL | Status: DC | PRN

## 2015-12-15 MED ORDER — SODIUM CHLORIDE 0.9% FLUSH
3.0000 mL | Freq: Two times a day (BID) | INTRAVENOUS | Status: DC
  Administered 2015-12-15: 3 mL via INTRAVENOUS

## 2015-12-15 MED ORDER — SODIUM CHLORIDE 0.9 % WEIGHT BASED INFUSION: 1.0000 mL/kg/h | INTRAVENOUS | Status: DC

## 2015-12-15 MED ORDER — NITROGLYCERIN 0.4 MG SL SUBL: 0.4000 mg | SUBLINGUAL_TABLET | SUBLINGUAL | Status: DC | PRN

## 2015-12-15 MED ORDER — ONDANSETRON HCL 4 MG/2ML IJ SOLN: 4.0000 mg | Freq: Four times a day (QID) | INTRAMUSCULAR | Status: DC | PRN

## 2015-12-15 MED ORDER — LOSARTAN POTASSIUM 50 MG PO TABS
100.0000 mg | ORAL_TABLET | Freq: Every day | ORAL | Status: DC
  Administered 2015-12-16: 100 mg via ORAL
  Filled 2015-12-15: qty 2

## 2015-12-15 MED ORDER — SODIUM CHLORIDE 0.9% FLUSH
3.0000 mL | INTRAVENOUS | Status: DC | PRN
Start: 2015-12-15 — End: 2015-12-16

## 2015-12-15 NOTE — ED Notes (Signed)
Ordered heart healthy meal tray for pt. 

## 2015-12-15 NOTE — ED Provider Notes (Addendum)
[facility] DEPT Provider Note   CSN: [id number] Arrival date & time: 12/15/15  [id number]     History   Chief Complaint Chief Complaint  Patient presents with  . Weakness  . Arm Pain    HPI [name] is a 75 y.o. male.  Patient c/o pain to left arm and generalized weakness when going out to get newspaper.  States had similar pain in past and ended up having 2 stents then. Pain was constant, dull. Lasted 30 minutes. Resolved w rest. No other recent arm or chest pain, even w exertion. Denies sob. Felt vision was blurry earlier, now resolved. No numbness/weakness. No headache. No neck pain. No radicular pain.    Weakness  Pertinent negatives include no shortness of breath, no chest pain, no confusion and no headaches.  Arm Pain  Pertinent negatives include no chest pain, no abdominal pain, no headaches and no shortness of breath.    Past Medical History:  Diagnosis Date  . Arthritis    HANDS AND FEET  . Asymptomatic carotid artery stenosis BILATERAL    PER DOPPLER  01-05-10   RICA  1 - [id number]   LICA  40 - [id number] - followed by Dr. Donnetta Hutching  . CAD (coronary artery disease) 2002--  X2 BM STENTS  . Cataract immature BILATERAL   . Diverticulosis   . 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 History of diverticulosis    Noted on Colonoscopy  . History of inguinal herniorrhaphy   . Hydrocele, left   . Hyperlipidemia   . Hypertension   . Increased intraocular pressure   . Nocturia   . Paroxysmal atrial fibrillation ([place]) 06/30/14   chad2vasc score is at least 4  . Peripheral vascular disease ([place]) FOLLOWED BY DR EARLY-- VISIT NOTE 01-05-10 AND CAROTID / VASCULAR DOPPLER  RESULTS W/ CHART   ACTIVE WALKING PROGRAM  . 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 Retinal detachment   . Stroke [facility])     Patient Active Problem List   Diagnosis Date Noted  . Carotid stenosis 02/25/2015  . Essential hypertension 02/25/2015  . Stroke ([place])  02/22/2015  . CVA (cerebral infarction) 02/22/2015  . Cerebrovascular accident (CVA) due to occlusion of cerebral artery ([place])   . Renal insufficiency   . Paroxysmal atrial fibrillation ([place]) 09/12/2014  . Migraine aura without headache 09/10/2013  . Peripheral vascular disease, unspecified ([place]) 10/05/2012  . Abdominal aneurysm without mention of rupture 10/05/2012  . 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 Pure hypercholesterolemia 10/05/2012  . Coronary atherosclerosis of native coronary artery 10/05/2012  . Other and unspecified hyperlipidemia 10/05/2012  . Atherosclerosis of renal artery ([place]) 10/05/2012  . Other specified type of hydrocele 10/05/2012  . Family history of malignant neoplasm of gastrointestinal tract 10/05/2012  . Bronchitis, not specified as acute or chronic 10/05/2012  . Nocturia   . Arthritis   . History of AAA (abdominal aortic aneurysm) repair   . Status post primary angioplasty with coronary stent   . Popliteal artery aneurysm ([place])   . Retinal detachment   . Increased intraocular pressure   . History of inguinal herniorrhaphy   . History of diverticulosis   . Hydrocele 01/24/2011  . Hydrocele, left 01/17/2011    Past Surgical History:  Procedure Laterality Date  . AAA REPAIR W/ AORTOBIFEMORAL BYPASS  OCT  16109  . ABDOMINAL HERNIA REPAIR  X4  (LAST ONE 1990'S)   inguinal herniorrhaphy  . CARPAL  TUNNEL RELEASE Right Oct. 2013   Hand  . CORONARY ANGIOPLASTY  2002   PTCA  W/ X2 BM STENTS--   PROXIMA LAD  &  LEFT CIRCUMFLEX TO FIRST OBTUSE MARGINAL BRANCH  . ENDARTERECTOMY Left 03/11/2015   Procedure: ENDARTERECTOMY LEFT CAROTID;  Surgeon: Rosetta Posner, MD;  Location: [facility];  Service: Vascular;  Laterality: Left;  . EXPLORATORY LAPAROTOMY W/ ENTEROLYSIS FOR PARTIAL SBO  09-23-2008  . heart stents  02/19/00  . HYDROCELE EXCISION  01/24/2011   Procedure: HYDROCELECTOMY ADULT;  Surgeon: Bernestine Amass, MD;  Location: [facility];   Service: Urology;  Laterality: Left;  . Ingrown Toe Nail Right Jan. 2015   Right Great Toe nail  . intest blockage  09/22/08  . PATCH ANGIOPLASTY Left 03/11/2015   Procedure: PATCH ANGIOPLASTY LEFT CAROTID USING HEMASHIELD PLATINUM FINESSE PATCH;  Surgeon: Rosetta Posner, MD;  Location: [facility];  Service: Vascular;  Laterality: Left;  . PULLEY RELEASE -- RIGHT 5TH FINGER  2009  . RETINAL DETACHMENT SURGERY  2004   BILATERAL  . TOOTH EXTRACTION  Dec 2013       Home Medications    Prior to Admission medications   Medication Sig Start Date End Date Taking? Authorizing Provider  apixaban (ELIQUIS) 5 MG TABS tablet Take 1 tablet (5 mg total) by mouth 2 (two) times daily. 06/30/14  Yes Sueanne Margarita, MD  aspirin EC 81 MG tablet Take 81 mg by mouth daily.   Yes Historical Provider, MD  b complex vitamins tablet Take 1 tablet by mouth daily.     Yes Historical Provider, MD  calcium carbonate (OS-CAL) 600 MG TABS Take 600 mg by mouth daily.     Yes Historical Provider, MD  Cholecalciferol 1000 UNITS capsule Take 2,000 Units by mouth daily.    Yes Historical Provider, MD  Coenzyme Q10 (CO Q 10) 100 MG CAPS Take 1 capsule by mouth daily.   Yes Historical Provider, MD  diltiazem (TIAZAC) 300 MG 24 hr capsule Take 300 mg by mouth as needed.   Yes Historical Provider, MD  finasteride (PROSCAR) 5 MG tablet Take 5 mg by mouth daily.   Yes Historical Provider, MD  hydrochlorothiazide (HYDRODIURIL) 25 MG tablet Take 25 mg by mouth daily.     Yes Historical Provider, MD  losartan (COZAAR) 100 MG tablet Take 100 mg by mouth daily.   Yes Historical Provider, MD  Magnesium Hydroxide (MAGNESIA PO) Take 1,000 mg by mouth daily.    Yes Historical Provider, MD  Multiple Vitamin (MULTIVITAMIN) tablet Take 1 tablet by mouth daily.     Yes Historical Provider, MD  niacinamide 500 MG tablet Take 500 mg by mouth daily.   Yes Historical Provider, MD  Omega-3 Fatty Acids (FISH OIL PO) Take 1,200 mg by mouth daily.   Yes  Historical Provider, MD  oxyCODONE (ROXICODONE) 5 MG immediate release tablet Take 1 tablet (5 mg total) by mouth every 6 (six) hours as needed. 03/11/15  Yes Samantha J Rhyne, PA-C  potassium gluconate 595 (99 K) MG TABS tablet Take 595 mg by mouth.   Yes Historical Provider, MD  rosuvastatin (CRESTOR) 40 MG tablet Take 1 tablet (40 mg total) by mouth every evening. 12/16/14  Yes Sueanne Margarita, MD  Tamsulosin HCl (FLOMAX) 0.4 MG CAPS Take 0.4 mg by mouth daily.     Yes Historical Provider, MD  vitamin C (ASCORBIC ACID) 500 MG tablet Take 2,000 mg by mouth daily.  Yes Historical Provider, MD  cyanocobalamin 1000 MCG tablet Take 1,000 mcg by mouth daily.    Historical Provider, MD  EPINEPHrine (EPIPEN) 0.3 mg/0.3 mL SOAJ injection Inject 0.3 mg into the muscle daily as needed (allergic reaction).     Historical Provider, MD    Family History Family History  Problem Relation Age of Onset  . Heart disease Mother     AAA  Before age 36  . Cancer Mother 51    Colon cancer  . Hyperlipidemia Mother   . Heart failure Father     Social History Social History  Substance Use Topics  . Smoking status: Former Smoker    Years: 30.00    Types: Cigarettes    Quit date: 01/18/1978  . Smokeless tobacco: Never Used  . Alcohol use 13.2 oz/week    1 Shots of liquor, 21 Standard drinks or equivalent per week     Comment: 2 oz of vodka per day     Allergies   Wasp venom   Review of Systems Review of Systems  Constitutional: Negative for fever.  HENT: Negative for sore throat.   Eyes: Negative for redness.  Respiratory: Negative for shortness of breath.   Cardiovascular: Negative for chest pain.  Gastrointestinal: Negative for abdominal pain.  Genitourinary: Negative for flank pain.  Musculoskeletal: Negative for back pain and neck pain.  Skin: Negative for rash.  Neurological: Positive for weakness. Negative for headaches.  Hematological: Does not bruise/bleed easily.    Psychiatric/Behavioral: Negative for confusion.     Physical Exam Updated Vital Signs BP 163/81   Pulse (!) 58   Temp 97.4 F (36.3 C) (Oral)   Resp 12   Ht 6\' 1"  (1.854 m)   Wt 86.2 kg   SpO2 100%   BMI 25.07 kg/m   Physical Exam  Constitutional: He appears well-developed and well-nourished. No distress.  HENT:  Mouth/Throat: Oropharynx is clear and moist.  Eyes: Conjunctivae are normal.  Neck: Neck supple. No tracheal deviation present.  Cardiovascular: Normal rate, regular rhythm, normal heart sounds and intact distal pulses.  Exam reveals no gallop and no friction rub.   No murmur heard. Pulmonary/Chest: Effort normal and breath sounds normal. No accessory muscle usage. No respiratory distress.  Abdominal: Soft. Bowel sounds are normal. He exhibits no distension.  Musculoskeletal: He exhibits no edema.  Neurological: He is alert.  Speech fluent. Motor intact bil. Ambulates w steady gait.   Skin: Skin is warm and dry. He is not diaphoretic.  Psychiatric: He has a normal mood and affect.  Nursing note and vitals reviewed.    ED Treatments / Results  Labs (all labs ordered are listed, but only abnormal results are displayed) Results for orders placed or performed during the hospital encounter of 12/15/15  CBC  Result Value Ref Range   WBC 6.6 4.0 - 10.5 K/uL   RBC 3.96 (L) 4.22 - 5.81 MIL/uL   Hemoglobin 13.3 13.0 - 17.0 g/dL   HCT 38.5 (L) 39.0 - 52.0 %   MCV 97.2 78.0 - 100.0 fL   MCH 33.6 26.0 - 34.0 pg   MCHC 34.5 30.0 - 36.0 g/dL   RDW 12.8 11.5 - 15.5 %   Platelets 214 150 - 400 K/uL  Basic metabolic panel  Result Value Ref Range   Sodium 137 135 - 145 mmol/L   Potassium 3.9 3.5 - 5.1 mmol/L   Chloride 102 101 - 111 mmol/L   CO2 27 22 - 32 mmol/L  Glucose, Bld 105 (H) 65 - 99 mg/dL   BUN 16 6 - 20 mg/dL   Creatinine, Ser 0.99 0.61 - 1.24 mg/dL   Calcium 9.2 8.9 - 10.3 mg/dL   GFR calc non Af Amer >60 >60 mL/min   GFR calc Af Amer >60 >60 mL/min    Anion gap 8 5 - 15  I-stat troponin, ED  Result Value Ref Range   Troponin i, poc 0.01 0.00 - 0.08 ng/mL   Comment 3          I-stat troponin, ED  Result Value Ref Range   Troponin i, poc 0.26 (HH) 0.00 - 0.08 ng/mL   Comment NOTIFIED PHYSICIAN    Comment 3           Dg Chest 2 View  Result Date: 12/15/2015 CLINICAL DATA:  Upper extremity pain on the left with blurred vision EXAM: CHEST  2 VIEW COMPARISON:  Jun 10, 2014 FINDINGS: There is no edema or consolidation. Heart size and pulmonary vascularity are normal. There is atherosclerotic calcification in the aorta. There is calcification in the mitral annulus. Foci of coronary artery calcification noted. There is degenerative change in the thoracic spine with anterior wedging of several mid to lower thoracic vertebral bodies, stable. There is increase in kyphosis. IMPRESSION: No edema or consolidation. Aortic atherosclerosis. Foci of coronary artery calcification also noted in the left anterior descending and right coronary artery regions. Stable appearance of thoracic spine. Electronically Signed   By: Lowella Grip III M.D.   On: 12/15/2015 09:11    EKG  EKG Interpretation  Date/Time:  Tuesday December 15 2015 08:33:13 EST Ventricular Rate:  57 PR Interval:    QRS Duration: 116 QT Interval:  461 QTC Calculation: 449 R Axis:   7 Text Interpretation:  Sinus rhythm Nonspecific intraventricular conduction delay Confirmed by Ashok Cordia  MD, Lennette Bihari ([id number]) on 12/15/2015 8:37:33 AM       Radiology Dg Chest 2 View  Result Date: 12/15/2015 CLINICAL DATA:  Upper extremity pain on the left with blurred vision EXAM: CHEST  2 VIEW COMPARISON:  Jun 10, 2014 FINDINGS: There is no edema or consolidation. Heart size and pulmonary vascularity are normal. There is atherosclerotic calcification in the aorta. There is calcification in the mitral annulus. Foci of coronary artery calcification noted. There is degenerative change in the thoracic  spine with anterior wedging of several mid to lower thoracic vertebral bodies, stable. There is increase in kyphosis. IMPRESSION: No edema or consolidation. Aortic atherosclerosis. Foci of coronary artery calcification also noted in the left anterior descending and right coronary artery regions. Stable appearance of thoracic spine. Electronically Signed   By: Lowella Grip III M.D.   On: 12/15/2015 09:11    Procedures Procedures (including critical care time)  Medications Ordered in ED Medications - No data to display   Initial Impression / Assessment and Plan / ED Course  I have reviewed the triage vital signs and the nursing notes.  Pertinent labs & imaging results that were available during my care of the patient were reviewed by me and considered in my medical decision making (see chart for details).  Clinical Course    Iv ns. Labs. Ecg. Cxr.  Patient anxious, feels similar to prior cardiac event. Will consult cardiology.   Reviewed nursing notes and prior charts for additional history.   Recheck pt, no chest pain.  Discussed/consulted with cardiology - they will evaluate in ED - dispo per cardiology team.   Cardiology has evaluated,  and feels if 2nd trop neg, can be d/c to home.  Repeat troponin is high, .26 - cardiology re-consulted for admission.   Recheck pt, no chest pain.    Final Clinical Impressions(s) / ED Diagnoses   Final diagnoses:  None    New Prescriptions New Prescriptions   No medications on file         Lajean Saver, MD 12/15/15 1356

## 2015-12-15 NOTE — ED Triage Notes (Signed)
Pt went to get his paper at 0630 this morning. Pt had sudden onset of left arm pain, weakness all over, and blurred vision. Blurred vision resolved 0645. Pt still feeling generalized weakness and left arm pain. Denies CP. BP 166/72, HR 56 sinus brady. Equal grip strength, no deficits.

## 2015-12-15 NOTE — H&P (Signed)
History and Physical   Patient ID: LIBERO MATHERS MRN: [id number] DOB/AGE: 77-Jan-1942 77 y.o.  Admit date: 12/15/2015  Primary Physician   Henrine Screws, MD Primary Cardiologist   Dr. Radford Pax Reason for Consultation  Symptoms somewhat similar prior CAD Requesting Physician  Dr. Ashok Cordia  HPI: HARDY WICHT is a 75 y.o. male with a history of CAD s/p BMS x 2 in 2002, HTN, HLD, PAF on Eliquis, bilateral carotid artery disease s/p L CEA, PVD followed by Dr. Donnetta Hutching with AAA repair and TIA Presented for evaluation of left arm numbness and blurred vision.  Last stress test 05/2014 was normal. Last echocardiogram 02/2015 showed left ventricular function of 60-65%, no wall motion abnormality, moderate LVH, grade 1 diastolic dysfunction, calcified aortic and mitral valve, severely dilated left atrium, dilated right ventricle. An atrial septal aneurysm is noted, PFO cannot be excluded. The patient was doing well on cardiac stand point when last seen by Dr. Radford Pax 09/2015.  Patient was in usual state of health up until this morning. He woke up as usual in the morning around 6 AM. He went to get his paper and noted left arm numbness. He went inside and was reading news and noted blurred vision bilaterally. Symptoms lasted for approximately 15-20 minutes and EMS was called. He was noted to have a blood pressure of 166/72 with sinus bradycardia at rate of 56. No focal deficits. This symptoms is somewhat similar to when he had a stent placement in 2002. At that time he had a left-sided chest pain along with left arm numbness. This is also different from his TIA symptoms. Patient played 18 holes called yesterday without any difficulty. Patient denies any exertional chest pain or shortness of breath. Patient denies palpitation, orthopnea, PND, syncope, lower extremity edema, abdominal pain, melena, blood in his stool or urine. No recent illness or travel. Plan with his medication.  Point-of-care troponin  0.01. Electrolytes and kidney function normal. Hemoglobin 13.3. Chest x-ray showed no edema or consolidation. However shows  aortic atherosclerosis. Foci of coronary artery calcification also noted in the left anterior descending and right coronary artery regions. EKG shows sinus rhythm without acute changes.    Past Medical History:  Diagnosis Date  . Arthritis    HANDS AND FEET  . Asymptomatic carotid artery stenosis BILATERAL    PER DOPPLER  01-05-10   RICA  1 - [id number]   LICA  40 - [id number] - followed by Dr. Donnetta Hutching  . CAD (coronary artery disease) 2002--  X2 BM STENTS  . Cataract immature BILATERAL   . Diverticulosis   . 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 History of diverticulosis    Noted on Colonoscopy  . History of inguinal herniorrhaphy   . Hydrocele, left   . Hyperlipidemia   . Hypertension   . Increased intraocular pressure   . Nocturia   . Paroxysmal atrial fibrillation ([place]) 06/30/14   chad2vasc score is at least 4  . Peripheral vascular disease ([place]) FOLLOWED BY DR EARLY-- VISIT NOTE 01-05-10 AND CAROTID / VASCULAR DOPPLER  RESULTS W/ CHART   ACTIVE WALKING PROGRAM  . 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 Retinal detachment   . Stroke [facility])      Past Surgical History:  Procedure Laterality Date  . AAA REPAIR W/ AORTOBIFEMORAL BYPASS  OCT  60454  . ABDOMINAL HERNIA REPAIR  X4  (LAST ONE 1990'S)   inguinal herniorrhaphy  . CARPAL TUNNEL RELEASE  Right Oct. 2013   Hand  . CORONARY ANGIOPLASTY  2002   PTCA  W/ X2 BM STENTS--   PROXIMA LAD  &  LEFT CIRCUMFLEX TO FIRST OBTUSE MARGINAL BRANCH  . ENDARTERECTOMY Left 03/11/2015   Procedure: ENDARTERECTOMY LEFT CAROTID;  Surgeon: Rosetta Posner, MD;  Location: [facility];  Service: Vascular;  Laterality: Left;  . EXPLORATORY LAPAROTOMY W/ ENTEROLYSIS FOR PARTIAL SBO  09-23-2008  . heart stents  02/19/00  . HYDROCELE EXCISION  01/24/2011   Procedure: HYDROCELECTOMY ADULT;  Surgeon:  Bernestine Amass, MD;  Location: [facility];  Service: Urology;  Laterality: Left;  . Ingrown Toe Nail Right Jan. 2015   Right Great Toe nail  . intest blockage  09/22/08  . PATCH ANGIOPLASTY Left 03/11/2015   Procedure: PATCH ANGIOPLASTY LEFT CAROTID USING HEMASHIELD PLATINUM FINESSE PATCH;  Surgeon: Rosetta Posner, MD;  Location: [facility];  Service: Vascular;  Laterality: Left;  . PULLEY RELEASE -- RIGHT 5TH FINGER  2009  . RETINAL DETACHMENT SURGERY  2004   BILATERAL  . TOOTH EXTRACTION  Dec 2013    Allergies  Allergen Reactions  . Wasp Venom Shortness Of Breath and Swelling    I have reviewed the patient's current medications     Prior to Admission medications   Medication Sig Start Date End Date Taking? Authorizing Provider  apixaban (ELIQUIS) 5 MG TABS tablet Take 1 tablet (5 mg total) by mouth 2 (two) times daily. 06/30/14  Yes Sueanne Margarita, MD  aspirin EC 81 MG tablet Take 81 mg by mouth daily.   Yes Historical Provider, MD  b complex vitamins tablet Take 1 tablet by mouth daily.     Yes Historical Provider, MD  calcium carbonate (OS-CAL) 600 MG TABS Take 600 mg by mouth daily.     Yes Historical Provider, MD  Cholecalciferol 1000 UNITS capsule Take 2,000 Units by mouth daily.    Yes Historical Provider, MD  Coenzyme Q10 (CO Q 10) 100 MG CAPS Take 1 capsule by mouth daily.   Yes Historical Provider, MD  diltiazem (TIAZAC) 300 MG 24 hr capsule Take 300 mg by mouth as needed.   Yes Historical Provider, MD  finasteride (PROSCAR) 5 MG tablet Take 5 mg by mouth daily.   Yes Historical Provider, MD  hydrochlorothiazide (HYDRODIURIL) 25 MG tablet Take 25 mg by mouth daily.     Yes Historical Provider, MD  losartan (COZAAR) 100 MG tablet Take 100 mg by mouth daily.   Yes Historical Provider, MD  Magnesium Hydroxide (MAGNESIA PO) Take 1,000 mg by mouth daily.    Yes Historical Provider, MD  Multiple Vitamin (MULTIVITAMIN) tablet Take 1 tablet by mouth daily.     Yes  Historical Provider, MD  niacinamide 500 MG tablet Take 500 mg by mouth daily.   Yes Historical Provider, MD  Omega-3 Fatty Acids (FISH OIL PO) Take 1,200 mg by mouth daily.   Yes Historical Provider, MD  oxyCODONE (ROXICODONE) 5 MG immediate release tablet Take 1 tablet (5 mg total) by mouth every 6 (six) hours as needed. 03/11/15  Yes Samantha J Rhyne, PA-C  potassium gluconate 595 (99 K) MG TABS tablet Take 595 mg by mouth.   Yes Historical Provider, MD  rosuvastatin (CRESTOR) 40 MG tablet Take 1 tablet (40 mg total) by mouth every evening. 12/16/14  Yes Sueanne Margarita, MD  Tamsulosin HCl (FLOMAX) 0.4 MG CAPS Take 0.4 mg by mouth daily.     Yes Historical Provider,  MD  vitamin C (ASCORBIC ACID) 500 MG tablet Take 2,000 mg by mouth daily.   Yes Historical Provider, MD  cyanocobalamin 1000 MCG tablet Take 1,000 mcg by mouth daily.    Historical Provider, MD  EPINEPHrine (EPIPEN) 0.3 mg/0.3 mL SOAJ injection Inject 0.3 mg into the muscle daily as needed (allergic reaction).     Historical Provider, MD     Social History   Social History  . Marital status: Married    Spouse name: N/A  . Number of children: N/A  . Years of education: N/A   Occupational History  . Not on file.   Social History Main Topics  . Smoking status: Former Smoker    Years: 30.00    Types: Cigarettes    Quit date: 01/18/1978  . Smokeless tobacco: Never Used  . Alcohol use 13.2 oz/week    1 Shots of liquor, 21 Standard drinks or equivalent per week     Comment: 2 oz of vodka per day  . Drug use: No  . Sexual activity: Not on file   Other Topics Concern  . Not on file   Social History Narrative   Pt lives in [place] with spouse.  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    Family Status  Relation Status  . Mother Deceased at age 58   Abdominal Cancer  . Father Deceased at age 19  . Son Deceased   Melanoma   Family History  Problem Relation Age of Onset  . Heart disease Mother      AAA  Before age 50  . Cancer Mother 45    Colon cancer  . Hyperlipidemia Mother   . Heart failure Father     ROS:  Full 14 point review of systems complete and found to be negative unless listed above.  Physical Exam: Blood pressure 155/73, pulse (!) 59, temperature 97.4 F (36.3 C), temperature source Oral, resp. rate 10, height 6\' 1"  (1.854 m), weight 190 lb (86.2 kg), SpO2 99 %.  General: Well developed, well nourished, male in no acute distress Head: Eyes PERRLA, No xanthomas. Normocephalic and atraumatic, oropharynx without edema or exudate.  Lungs: Resp regular and unlabored, CTA. Heart: RRR no s3, s4, or murmurs..   Neck: No carotid bruits. No lymphadenopathy.  No JVD. Abdomen: Bowel sounds present, abdomen soft and non-tender without masses or hernias noted. Msk:  No spine or cva tenderness. No weakness, no joint deformities or effusions. Extremities: No clubbing, cyanosis or edema. DP/PT/Radials 2+ and equal bilaterally. Neuro: Alert and oriented X 3. No focal deficits noted. Psych:  Good affect, responds appropriately Skin: No rashes or lesions noted.  Labs:   Lab Results  Component Value Date   WBC 6.6 12/15/2015   HGB 13.3 12/15/2015   HCT 38.5 (L) 12/15/2015   MCV 97.2 12/15/2015   PLT 214 12/15/2015   No results for input(s): INR in the last 72 hours.  Recent Labs Lab 12/15/15 0930  NA 137  K 3.9  CL 102  CO2 27  BUN 16  CREATININE 0.99  CALCIUM 9.2  GLUCOSE 105*   Magnesium  Date Value Ref Range Status  09/23/2008 2.3 1.5 - 2.5 mg/dL Final   No results for input(s): CKTOTAL, CKMB, TROPONINI in the last 72 hours.  Recent Labs  12/15/15 0936  TROPIPOC 0.01   No results found for: PROBNP Lab Results  Component Value Date   CHOL 195 10/14/2015   HDL 73 10/14/2015  [place] 108 10/14/2015   TRIG 72 10/14/2015   No results found for: DDIMER Lipase  Date/Time Value Ref Range Status  09/22/2008 10:25 AM 249 (H) 11 - 59 U/L Final   TSH    Date/Time Value Ref Range Status  06/11/2014 02:31 AM 1.534 0.350 - 4.500 uIU/mL Final   No results found for: VITAMINB12, FOLATE, FERRITIN, TIBC, IRON, RETICCTPCT  Echo: 02/2015 LV EF: 60% -   65%  ------------------------------------------------------------------- Indications:      CVA 436.  ------------------------------------------------------------------- History:   PMH:   Atrial fibrillation.  Coronary artery disease. Risk factors:  Hypertension. Dyslipidemia.  ------------------------------------------------------------------- Study Conclusions  - Left ventricle: The cavity size was normal. Wall thickness was   increased in a pattern of moderate LVH. Systolic function was   normal. The estimated ejection fraction was in the range of 60%   to 65%. Wall motion was normal; there were no regional wall   motion abnormalities. Doppler parameters are consistent with   abnormal left ventricular relaxation (grade 1 diastolic   dysfunction). The E/e&' ratio is between 8-15, suggesting   indeteminate LV filling pressure. - Aortic valve: Trileaflet; mildly calcified leaflets. There was no   stenosis. There was no significant regurgitation. - Mitral valve: Moderate posterior calcified annulus. Mildly   thickened leaflets . - Left atrium: Severely dilated at 58 ml/m2. - Right ventricle: The cavity size was mildly dilated. Systolic   function was normal. - Right atrium: The atrium was mildly dilated. - Atrial septum: Aneurysmal IAS - PFO cannot be excluded. - Tricuspid valve: There was mild regurgitation. - Pulmonary arteries: PA peak pressure: 26 mm Hg (S). - Inferior vena cava: The vessel was normal in size. The   respirophasic diameter changes were in the normal range (= 50%),   consistent with normal central venous pressure.  Impressions:  - Compared with a prior study in 07/2014, there are few changes   except there is now severe LAE. An atrial septal aneurysm is    noted, PFO cannot be excluded.  Radiology:  Dg Chest 2 View  Result Date: 12/15/2015 CLINICAL DATA:  Upper extremity pain on the left with blurred vision EXAM: CHEST  2 VIEW COMPARISON:  Jun 10, 2014 FINDINGS: There is no edema or consolidation. Heart size and pulmonary vascularity are normal. There is atherosclerotic calcification in the aorta. There is calcification in the mitral annulus. Foci of coronary artery calcification noted. There is degenerative change in the thoracic spine with anterior wedging of several mid to lower thoracic vertebral bodies, stable. There is increase in kyphosis. IMPRESSION: No edema or consolidation. Aortic atherosclerosis. Foci of coronary artery calcification also noted in the left anterior descending and right coronary artery regions. Stable appearance of thoracic spine. Electronically Signed   By: Lowella Grip III M.D.   On: 12/15/2015 09:11    ASSESSMENT AND PLAN:     1. L arm numbness - Somewhat atypical for CAD. Occurred while getting newspaper followed by transient blurred vision. He had a chest pain and left arm numbness during his stent placement in 2002. No chest pain today. He denies any exertional chest pain, shortness of breath or fatigue. He played 18 holes of golf yesterday without any discomfort. EKG without acute changes. Point-of-care troponin 0.01. CXR showed coronary artery calcification. Check another troponin, if negative he can be discharged.   2. CAD s/p BMS x 2 in 2002  3. HTN - Elevated at presentation. Now improved. Continue losartan 100 mg once a day  and hydrochlorothiazide 25 mg once a day. Keep log of BP.   4. PAF - Maintaining sinus rhythm. CHADSVASCs score of 6. Antinuclear and increased for anticoagulation. He takes diltiazem 300 mg 24 hour as needed for palpitation.  5. HLD - LDL 108 10/14/15. Continue Crestor 40mg . Not at LDL goal of < 70. Will defer management to Dr. Radford Pax.   6. PVD - Followed by Dr. Donnetta Hutching. He is s/p  L CEA. Last carotid doppler 09/2015 showed widely patent endarterectomy with no significant stenosis his right carotid is in the 40-59% stenosis range. Less suspicious for current symptoms is due to this.   SignedLeanor Kail, PA 12/15/2015, 12:12 PM Pager [phone]  Co-Sign MD  I have examined the patient and reviewed assessment and plan and discussed with patient.  Agree with above as stated.  Patient with second troponin positive.  Will admit for cath tomorrow.  No heparin since on Eliquis and this is wearing off.  Las t Eliquis dose was on 12/14/15.  Pain free now.  BMS in 2002.  No cath since that time.    Larae Grooms

## 2015-12-15 NOTE — Consult Note (Deleted)
CARDIOLOGY CONSULT NOTE   Patient ID: [name] MRN: [id number] DOB/AGE: 04/29/1940 75 y.o.  Admit date: 12/15/2015  Primary Physician   Henrine Screws, MD Primary Cardiologist   Dr. Radford Pax Reason for Consultation  Symptoms somewhat similar prior CAD Requesting Physician  Dr. Ashok Cordia  HPI: [name] is a 75 y.o. male with a history of CAD s/p BMS x 2 in 2002, HTN, HLD, PAF on Eliquis, bilateral carotid artery disease s/p L CEA, PVD followed by Dr. Donnetta Hutching with AAA repair and TIA Presented for evaluation of left arm numbness and blurred vision.  Last stress test 05/2014 was normal. Last echocardiogram 02/2015 showed left ventricular function of 60-65%, no wall motion abnormality, moderate LVH, grade 1 diastolic dysfunction, calcified aortic and mitral valve, severely dilated left atrium, dilated right ventricle. An atrial septal aneurysm is noted, PFO cannot be excluded. The patient was doing well on cardiac stand point when last seen by Dr. Radford Pax 09/2015.  Patient was in usual state of health up until this morning. He woke up as usual in the morning around 6 AM. He went to get his paper and noted left arm numbness. He went inside and was reading news and noted blurred vision bilaterally. Symptoms lasted for approximately 15-20 minutes and EMS was called. He was noted to have a blood pressure of 166/72 with sinus bradycardia at rate of 56. No focal deficits. This symptoms is somewhat similar to when he had a stent placement in 2002. At that time he had a left-sided chest pain along with left arm numbness. This is also different from his TIA symptoms. Patient played 18 holes called yesterday without any difficulty. Patient denies any exertional chest pain or shortness of breath. Patient denies palpitation, orthopnea, PND, syncope, lower extremity edema, abdominal pain, melena, blood in his stool or urine. No recent illness or travel. Plan with his medication.  Point-of-care  troponin 0.01. Electrolytes and kidney function normal. Hemoglobin 13.3. Chest x-ray showed no edema or consolidation. However shows  aortic atherosclerosis. Foci of coronary artery calcification also noted in the left anterior descending and right coronary artery regions. EKG shows sinus rhythm without acute changes.    Past Medical History:  Diagnosis Date  . Arthritis    HANDS AND FEET  . Asymptomatic carotid artery stenosis BILATERAL    PER DOPPLER  01-05-10   RICA  1 - [id number]   LICA  40 - [id number] - followed by Dr. Donnetta Hutching  . CAD (coronary artery disease) 2002--  X2 BM STENTS  . Cataract immature BILATERAL   . Diverticulosis   . 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 History of diverticulosis    Noted on Colonoscopy  . History of inguinal herniorrhaphy   . Hydrocele, left   . Hyperlipidemia   . Hypertension   . Increased intraocular pressure   . Nocturia   . Paroxysmal atrial fibrillation ([place]) 06/30/14   chad2vasc score is at least 4  . Peripheral vascular disease ([place]) FOLLOWED BY DR EARLY-- VISIT NOTE 01-05-10 AND CAROTID / VASCULAR DOPPLER  RESULTS W/ CHART   ACTIVE WALKING PROGRAM  . 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 Retinal detachment   . Stroke [facility])      Past Surgical History:  Procedure Laterality Date  . AAA REPAIR W/ AORTOBIFEMORAL BYPASS  OCT  91478  . ABDOMINAL HERNIA REPAIR  X4  (LAST ONE 1990'S)   inguinal herniorrhaphy  . CARPAL TUNNEL  RELEASE Right Oct. 2013   Hand  . CORONARY ANGIOPLASTY  2002   PTCA  W/ X2 BM STENTS--   PROXIMA LAD  &  LEFT CIRCUMFLEX TO FIRST OBTUSE MARGINAL BRANCH  . ENDARTERECTOMY Left 03/11/2015   Procedure: ENDARTERECTOMY LEFT CAROTID;  Surgeon: Rosetta Posner, MD;  Location: [facility];  Service: Vascular;  Laterality: Left;  . EXPLORATORY LAPAROTOMY W/ ENTEROLYSIS FOR PARTIAL SBO  09-23-2008  . heart stents  02/19/00  . HYDROCELE EXCISION  01/24/2011   Procedure: HYDROCELECTOMY ADULT;   Surgeon: Bernestine Amass, MD;  Location: [facility];  Service: Urology;  Laterality: Left;  . Ingrown Toe Nail Right Jan. 2015   Right Great Toe nail  . intest blockage  09/22/08  . PATCH ANGIOPLASTY Left 03/11/2015   Procedure: PATCH ANGIOPLASTY LEFT CAROTID USING HEMASHIELD PLATINUM FINESSE PATCH;  Surgeon: Rosetta Posner, MD;  Location: [facility];  Service: Vascular;  Laterality: Left;  . PULLEY RELEASE -- RIGHT 5TH FINGER  2009  . RETINAL DETACHMENT SURGERY  2004   BILATERAL  . TOOTH EXTRACTION  Dec 2013    Allergies  Allergen Reactions  . Wasp Venom Shortness Of Breath and Swelling    I have reviewed the patient's current medications     Prior to Admission medications   Medication Sig Start Date End Date Taking? Authorizing Provider  apixaban (ELIQUIS) 5 MG TABS tablet Take 1 tablet (5 mg total) by mouth 2 (two) times daily. 06/30/14  Yes Sueanne Margarita, MD  aspirin EC 81 MG tablet Take 81 mg by mouth daily.   Yes Historical Provider, MD  b complex vitamins tablet Take 1 tablet by mouth daily.     Yes Historical Provider, MD  calcium carbonate (OS-CAL) 600 MG TABS Take 600 mg by mouth daily.     Yes Historical Provider, MD  Cholecalciferol 1000 UNITS capsule Take 2,000 Units by mouth daily.    Yes Historical Provider, MD  Coenzyme Q10 (CO Q 10) 100 MG CAPS Take 1 capsule by mouth daily.   Yes Historical Provider, MD  diltiazem (TIAZAC) 300 MG 24 hr capsule Take 300 mg by mouth as needed.   Yes Historical Provider, MD  finasteride (PROSCAR) 5 MG tablet Take 5 mg by mouth daily.   Yes Historical Provider, MD  hydrochlorothiazide (HYDRODIURIL) 25 MG tablet Take 25 mg by mouth daily.     Yes Historical Provider, MD  losartan (COZAAR) 100 MG tablet Take 100 mg by mouth daily.   Yes Historical Provider, MD  Magnesium Hydroxide (MAGNESIA PO) Take 1,000 mg by mouth daily.    Yes Historical Provider, MD  Multiple Vitamin (MULTIVITAMIN) tablet Take 1 tablet by mouth daily.     Yes  Historical Provider, MD  niacinamide 500 MG tablet Take 500 mg by mouth daily.   Yes Historical Provider, MD  Omega-3 Fatty Acids (FISH OIL PO) Take 1,200 mg by mouth daily.   Yes Historical Provider, MD  oxyCODONE (ROXICODONE) 5 MG immediate release tablet Take 1 tablet (5 mg total) by mouth every 6 (six) hours as needed. 03/11/15  Yes Samantha J Rhyne, PA-C  potassium gluconate 595 (99 K) MG TABS tablet Take 595 mg by mouth.   Yes Historical Provider, MD  rosuvastatin (CRESTOR) 40 MG tablet Take 1 tablet (40 mg total) by mouth every evening. 12/16/14  Yes Sueanne Margarita, MD  Tamsulosin HCl (FLOMAX) 0.4 MG CAPS Take 0.4 mg by mouth daily.     Yes Historical  Provider, MD  vitamin C (ASCORBIC ACID) 500 MG tablet Take 2,000 mg by mouth daily.   Yes Historical Provider, MD  cyanocobalamin 1000 MCG tablet Take 1,000 mcg by mouth daily.    Historical Provider, MD  EPINEPHrine (EPIPEN) 0.3 mg/0.3 mL SOAJ injection Inject 0.3 mg into the muscle daily as needed (allergic reaction).     Historical Provider, MD     Social History   Social History  . Marital status: Married    Spouse name: N/A  . Number of children: N/A  . Years of education: N/A   Occupational History  . Not on file.   Social History Main Topics  . Smoking status: Former Smoker    Years: 30.00    Types: Cigarettes    Quit date: 01/18/1978  . Smokeless tobacco: Never Used  . Alcohol use 13.2 oz/week    1 Shots of liquor, 21 Standard drinks or equivalent per week     Comment: 2 oz of vodka per day  . Drug use: No  . Sexual activity: Not on file   Other Topics Concern  . Not on file   Social History Narrative   Pt lives in [place] with spouse.  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    Family Status  Relation Status  . Mother Deceased at age 13   Abdominal Cancer  . Father Deceased at age 36  . Son Deceased   Melanoma   Family History  Problem Relation Age of Onset  . Heart disease Mother      AAA  Before age 82  . Cancer Mother 18    Colon cancer  . Hyperlipidemia Mother   . Heart failure Father     ROS:  Full 14 point review of systems complete and found to be negative unless listed above.  Physical Exam: Blood pressure 155/73, pulse (!) 59, temperature 97.4 F (36.3 C), temperature source Oral, resp. rate 10, height 6\' 1"  (1.854 m), weight 190 lb (86.2 kg), SpO2 99 %.  General: Well developed, well nourished, male in no acute distress Head: Eyes PERRLA, No xanthomas. Normocephalic and atraumatic, oropharynx without edema or exudate.  Lungs: Resp regular and unlabored, CTA. Heart: RRR no s3, s4, or murmurs..   Neck: No carotid bruits. No lymphadenopathy.  No JVD. Abdomen: Bowel sounds present, abdomen soft and non-tender without masses or hernias noted. Msk:  No spine or cva tenderness. No weakness, no joint deformities or effusions. Extremities: No clubbing, cyanosis or edema. DP/PT/Radials 2+ and equal bilaterally. Neuro: Alert and oriented X 3. No focal deficits noted. Psych:  Good affect, responds appropriately Skin: No rashes or lesions noted.  Labs:   Lab Results  Component Value Date   WBC 6.6 12/15/2015   HGB 13.3 12/15/2015   HCT 38.5 (L) 12/15/2015   MCV 97.2 12/15/2015   PLT 214 12/15/2015   No results for input(s): INR in the last 72 hours.  Recent Labs Lab 12/15/15 0930  NA 137  K 3.9  CL 102  CO2 27  BUN 16  CREATININE 0.99  CALCIUM 9.2  GLUCOSE 105*   Magnesium  Date Value Ref Range Status  09/23/2008 2.3 1.5 - 2.5 mg/dL Final   No results for input(s): CKTOTAL, CKMB, TROPONINI in the last 72 hours.  Recent Labs  12/15/15 0936  TROPIPOC 0.01   No results found for: PROBNP Lab Results  Component Value Date   CHOL 195 10/14/2015   HDL 73 10/14/2015  [place] 108 10/14/2015   TRIG 72 10/14/2015   No results found for: DDIMER Lipase  Date/Time Value Ref Range Status  09/22/2008 10:25 AM 249 (H) 11 - 59 U/L Final   TSH    Date/Time Value Ref Range Status  06/11/2014 02:31 AM 1.534 0.350 - 4.500 uIU/mL Final   No results found for: VITAMINB12, FOLATE, FERRITIN, TIBC, IRON, RETICCTPCT  Echo: 02/2015 LV EF: 60% -   65%  ------------------------------------------------------------------- Indications:      CVA 436.  ------------------------------------------------------------------- History:   PMH:   Atrial fibrillation.  Coronary artery disease. Risk factors:  Hypertension. Dyslipidemia.  ------------------------------------------------------------------- Study Conclusions  - Left ventricle: The cavity size was normal. Wall thickness was   increased in a pattern of moderate LVH. Systolic function was   normal. The estimated ejection fraction was in the range of 60%   to 65%. Wall motion was normal; there were no regional wall   motion abnormalities. Doppler parameters are consistent with   abnormal left ventricular relaxation (grade 1 diastolic   dysfunction). The E/e&' ratio is between 8-15, suggesting   indeteminate LV filling pressure. - Aortic valve: Trileaflet; mildly calcified leaflets. There was no   stenosis. There was no significant regurgitation. - Mitral valve: Moderate posterior calcified annulus. Mildly   thickened leaflets . - Left atrium: Severely dilated at 58 ml/m2. - Right ventricle: The cavity size was mildly dilated. Systolic   function was normal. - Right atrium: The atrium was mildly dilated. - Atrial septum: Aneurysmal IAS - PFO cannot be excluded. - Tricuspid valve: There was mild regurgitation. - Pulmonary arteries: PA peak pressure: 26 mm Hg (S). - Inferior vena cava: The vessel was normal in size. The   respirophasic diameter changes were in the normal range (= 50%),   consistent with normal central venous pressure.  Impressions:  - Compared with a prior study in 07/2014, there are few changes   except there is now severe LAE. An atrial septal aneurysm is    noted, PFO cannot be excluded.  Radiology:  Dg Chest 2 View  Result Date: 12/15/2015 CLINICAL DATA:  Upper extremity pain on the left with blurred vision EXAM: CHEST  2 VIEW COMPARISON:  Jun 10, 2014 FINDINGS: There is no edema or consolidation. Heart size and pulmonary vascularity are normal. There is atherosclerotic calcification in the aorta. There is calcification in the mitral annulus. Foci of coronary artery calcification noted. There is degenerative change in the thoracic spine with anterior wedging of several mid to lower thoracic vertebral bodies, stable. There is increase in kyphosis. IMPRESSION: No edema or consolidation. Aortic atherosclerosis. Foci of coronary artery calcification also noted in the left anterior descending and right coronary artery regions. Stable appearance of thoracic spine. Electronically Signed   By: Lowella Grip III M.D.   On: 12/15/2015 09:11    ASSESSMENT AND PLAN:     1. L arm numbness - Somewhat atypical for CAD. Occurred while getting newspaper followed by transient blurred vision. He had a chest pain and left arm numbness during his stent placement in 2002. No chest pain today. He denies any exertional chest pain, shortness of breath or fatigue. He played 18 holes of golf yesterday without any discomfort. EKG without acute changes. Point-of-care troponin 0.01. CXR showed coronary artery calcification. Check another troponin, if negative he can be discharged.   2. CAD s/p BMS x 2 in 2002  3. HTN - Elevated at presentation. Now improved. Continue losartan 100 mg once a day  and hydrochlorothiazide 25 mg once a day. Keep log of BP.   4. PAF - Maintaining sinus rhythm. CHADSVASCs score of 6. Antinuclear and increased for anticoagulation. He takes diltiazem 300 mg 24 hour as needed for palpitation.  5. HLD - LDL 108 10/14/15. Continue Crestor 40mg . Not at LDL goal of < 70. Will defer management to Dr. Radford Pax.   6. PVD - Followed by Dr. Donnetta Hutching. He is s/p  L CEA. Last carotid doppler 09/2015 showed widely patent endarterectomy with no significant stenosis his right carotid is in the 40-59% stenosis range. Less suspicious for current symptoms is due to this.   SignedLeanor Kail, [place] 12/15/2015, 12:12 PM Pager [phone]  Co-Sign MD

## 2015-12-15 NOTE — ED Notes (Signed)
Attempted report x1. 

## 2015-12-16 ENCOUNTER — Other Ambulatory Visit: Payer: Self-pay | Admitting: *Deleted

## 2015-12-16 ENCOUNTER — Encounter (HOSPITAL_COMMUNITY)
Admission: EM | Disposition: A | Discharge status: Home / Self Care | Payer: Self-pay | Source: Home / Self Care | Attending: Cardiothoracic Surgery

## 2015-12-16 DIAGNOSIS — I2510 Atherosclerotic heart disease of native coronary artery without angina pectoris: Secondary | ICD-10-CM

## 2015-12-16 DIAGNOSIS — E785 Hyperlipidemia, unspecified: Secondary | ICD-10-CM | POA: Diagnosis not present

## 2015-12-16 DIAGNOSIS — Z79899 Other long term (current) drug therapy: Secondary | ICD-10-CM | POA: Diagnosis not present

## 2015-12-16 DIAGNOSIS — I480 Paroxysmal atrial fibrillation: Secondary | ICD-10-CM | POA: Diagnosis not present

## 2015-12-16 DIAGNOSIS — I10 Essential (primary) hypertension: Secondary | ICD-10-CM | POA: Diagnosis not present

## 2015-12-16 DIAGNOSIS — I25110 Atherosclerotic heart disease of native coronary artery with unstable angina pectoris: Secondary | ICD-10-CM | POA: Diagnosis not present

## 2015-12-16 DIAGNOSIS — R748 Abnormal levels of other serum enzymes: Secondary | ICD-10-CM

## 2015-12-16 DIAGNOSIS — E8770 Fluid overload, unspecified: Secondary | ICD-10-CM | POA: Diagnosis not present

## 2015-12-16 DIAGNOSIS — I739 Peripheral vascular disease, unspecified: Secondary | ICD-10-CM | POA: Diagnosis not present

## 2015-12-16 DIAGNOSIS — I482 Chronic atrial fibrillation: Secondary | ICD-10-CM | POA: Diagnosis not present

## 2015-12-16 DIAGNOSIS — Q211 Atrial septal defect: Secondary | ICD-10-CM | POA: Diagnosis not present

## 2015-12-16 DIAGNOSIS — I200 Unstable angina: Secondary | ICD-10-CM

## 2015-12-16 DIAGNOSIS — J9811 Atelectasis: Secondary | ICD-10-CM | POA: Diagnosis not present

## 2015-12-16 DIAGNOSIS — D62 Acute posthemorrhagic anemia: Secondary | ICD-10-CM | POA: Diagnosis not present

## 2015-12-16 DIAGNOSIS — I214 Non-ST elevation (NSTEMI) myocardial infarction: Secondary | ICD-10-CM | POA: Diagnosis not present

## 2015-12-16 DIAGNOSIS — I253 Aneurysm of heart: Secondary | ICD-10-CM | POA: Diagnosis not present

## 2015-12-16 HISTORY — PX: CARDIAC CATHETERIZATION: SHX172

## 2015-12-16 LAB — LIPID PANEL
CHOL/HDL RATIO: 2.4 ratio
CHOLESTEROL: 149 mg/dL (ref 0–200)
HDL: 61 mg/dL (ref >40)
LDL Cholesterol: 73 mg/dL (ref 0–99)
TRIGLYCERIDES: 74 mg/dL (ref <150)
VLDL: 15 mg/dL (ref 0–40)

## 2015-12-16 LAB — CBC
HCT: 39.3 % (ref 39.0–52.0)
Hemoglobin: 13.5 g/dL (ref 13.0–17.0)
MCH: 33.6 pg (ref 26.0–34.0)
MCHC: 34.4 g/dL (ref 30.0–36.0)
MCV: 97.8 fL (ref 78.0–100.0)
PLATELETS: 225 10*3/uL (ref 150–400)
RBC: 4.02 MIL/uL — AB (ref 4.22–5.81)
RDW: 12.3 % (ref 11.5–15.5)
WBC: 6 10*3/uL (ref 4.0–10.5)

## 2015-12-16 LAB — BASIC METABOLIC PANEL
ANION GAP: 8 (ref 5–15)
BUN: 15 mg/dL (ref 6–20)
CO2: 25 mmol/L (ref 22–32)
Calcium: 9.3 mg/dL (ref 8.9–10.3)
Chloride: 103 mmol/L (ref 101–111)
Creatinine, Ser: 0.97 mg/dL (ref 0.61–1.24)
Glucose, Bld: 109 mg/dL — ABNORMAL HIGH (ref 65–99)
POTASSIUM: 3.9 mmol/L (ref 3.5–5.1)
SODIUM: 136 mmol/L (ref 135–145)

## 2015-12-16 LAB — TROPONIN I
TROPONIN I: 0.65 ng/mL — AB (ref <0.03)
Troponin I: 0.91 ng/mL (ref <0.03)

## 2015-12-16 LAB — PROTIME-INR
INR: 1.15
Prothrombin Time: 14.8 seconds (ref 11.4–15.2)

## 2015-12-16 SURGERY — LEFT HEART CATH AND CORONARY ANGIOGRAPHY

## 2015-12-16 MED ORDER — NITROGLYCERIN 1 MG/10 ML FOR IR/CATH LAB
INTRA_ARTERIAL | Status: AC
  Filled 2015-12-16: qty 10

## 2015-12-16 MED ORDER — HEPARIN (PORCINE) IN NACL 100-0.45 UNIT/ML-% IJ SOLN
1200.0000 [IU]/h | INTRAMUSCULAR | Status: DC
  Administered 2015-12-17 – 2015-12-18 (×2): 1200 [IU]/h via INTRAVENOUS
  Filled 2015-12-16 (×2): qty 250

## 2015-12-16 MED ORDER — HEPARIN (PORCINE) IN NACL 2-0.9 UNIT/ML-% IJ SOLN
INTRAMUSCULAR | Status: AC
  Filled 2015-12-16: qty 1000

## 2015-12-16 MED ORDER — SODIUM CHLORIDE 0.9% FLUSH
3.0000 mL | Freq: Two times a day (BID) | INTRAVENOUS | Status: DC
  Administered 2015-12-16 – 2015-12-17 (×2): 3 mL via INTRAVENOUS

## 2015-12-16 MED ORDER — SODIUM CHLORIDE 0.9% FLUSH
3.0000 mL | INTRAVENOUS | Status: DC | PRN
Start: 2015-12-16 — End: 2015-12-18

## 2015-12-16 MED ORDER — LIDOCAINE HCL (PF) 1 % IJ SOLN
INTRAMUSCULAR | Status: AC
  Filled 2015-12-16: qty 30

## 2015-12-16 MED ORDER — MIDAZOLAM HCL 2 MG/2ML IJ SOLN
INTRAMUSCULAR | Status: AC
  Filled 2015-12-16: qty 2

## 2015-12-16 MED ORDER — VERAPAMIL HCL 2.5 MG/ML IV SOLN
INTRAVENOUS | Status: DC | PRN
  Administered 2015-12-16: 10 mL via INTRA_ARTERIAL

## 2015-12-16 MED ORDER — IOPAMIDOL (ISOVUE-370) INJECTION 76%
INTRAVENOUS | Status: DC | PRN
  Administered 2015-12-16: 90 mL via INTRA_ARTERIAL

## 2015-12-16 MED ORDER — FENTANYL CITRATE (PF) 100 MCG/2ML IJ SOLN
INTRAMUSCULAR | Status: DC | PRN
  Administered 2015-12-16: 50 ug via INTRAVENOUS

## 2015-12-16 MED ORDER — LIDOCAINE HCL (PF) 1 % IJ SOLN
INTRAMUSCULAR | Status: DC | PRN
  Administered 2015-12-16: 2 mL via INTRADERMAL

## 2015-12-16 MED ORDER — SODIUM CHLORIDE 0.9 % IV SOLN
INTRAVENOUS | Status: AC
  Administered 2015-12-16: 16:00:00 via INTRAVENOUS

## 2015-12-16 MED ORDER — HEPARIN (PORCINE) IN NACL 2-0.9 UNIT/ML-% IJ SOLN
INTRAMUSCULAR | Status: DC | PRN
  Administered 2015-12-16: 1000 mL

## 2015-12-16 MED ORDER — VERAPAMIL HCL 2.5 MG/ML IV SOLN
INTRAVENOUS | Status: AC
  Filled 2015-12-16: qty 2

## 2015-12-16 MED ORDER — MIDAZOLAM HCL 2 MG/2ML IJ SOLN
INTRAMUSCULAR | Status: DC | PRN
  Administered 2015-12-16: 1 mg via INTRAVENOUS

## 2015-12-16 MED ORDER — HEPARIN SODIUM (PORCINE) 1000 UNIT/ML IJ SOLN
INTRAMUSCULAR | Status: DC | PRN
  Administered 2015-12-16: 5000 [IU] via INTRAVENOUS

## 2015-12-16 MED ORDER — HEPARIN SODIUM (PORCINE) 1000 UNIT/ML IJ SOLN
INTRAMUSCULAR | Status: AC
  Filled 2015-12-16: qty 1

## 2015-12-16 MED ORDER — SODIUM CHLORIDE 0.9 % IV SOLN: 250.0000 mL | INTRAVENOUS | Status: DC | PRN

## 2015-12-16 MED ORDER — FENTANYL CITRATE (PF) 100 MCG/2ML IJ SOLN
INTRAMUSCULAR | Status: AC
  Filled 2015-12-16: qty 2

## 2015-12-16 SURGICAL SUPPLY — 12 items
CATH INFINITI 5 FR JL3.5 (CATHETERS) ×1 IMPLANT
CATH INFINITI 5FR ANG PIGTAIL (CATHETERS) ×1 IMPLANT
CATH OPTITORQUE JACKY 4.0 5F (CATHETERS) ×1 IMPLANT
DEVICE RAD COMP TR BAND LRG (VASCULAR PRODUCTS) ×1 IMPLANT
GLIDESHEATH SLEND SS 6F .021 (SHEATH) ×1 IMPLANT
GUIDEWIRE INQWIRE 1.5J.035X260 (WIRE) IMPLANT
INQWIRE 1.5J .035X260CM (WIRE) ×2
KIT HEART LEFT (KITS) ×2 IMPLANT
PACK CARDIAC CATHETERIZATION (CUSTOM PROCEDURE TRAY) ×2 IMPLANT
SYR MEDRAD MARK V 150ML (SYRINGE) ×2 IMPLANT
TRANSDUCER W/STOPCOCK (MISCELLANEOUS) ×2 IMPLANT
TUBING CIL FLEX 10 FLL-RA (TUBING) ×2 IMPLANT

## 2015-12-16 NOTE — Interval H&P Note (Signed)
Cath Lab Visit (complete for each Cath Lab visit)  Clinical Evaluation Leading to the Procedure:   ACS: Yes.    Non-ACS:    Anginal Classification: CCS IV  Anti-ischemic medical therapy: Minimal Therapy (1 class of medications)  Non-Invasive Test Results: No non-invasive testing performed  Prior CABG: No previous CABG      History and Physical Interval Note:  12/16/2015 2:25 PM  [name]  has presented today for surgery, with the diagnosis of cp  The various methods of treatment have been discussed with the patient and family. After consideration of risks, benefits and other options for treatment, the patient has consented to  Procedure(s): Left Heart Cath and Coronary Angiography (N/A) as a surgical intervention .  The patient's history has been reviewed, patient examined, no change in status, stable for surgery.  I have reviewed the patient's chart and labs.  Questions were answered to the patient's satisfaction.     [name]

## 2015-12-16 NOTE — H&P (View-Only) (Signed)
Patient Name: [name] Date of Encounter: 12/16/2015  Primary Cardiologist: Dr. Sundra Aland Problem List     Active Problems:   NSTEMI (non-ST elevated myocardial infarction) [facility])     Subjective   No further chest pain  Inpatient Medications    Scheduled Meds: . aspirin EC  81 mg Oral Daily  . finasteride  5 mg Oral Daily  . losartan  100 mg Oral Daily  . rosuvastatin  40 mg Oral QPM  . sodium chloride flush  3 mL Intravenous Q12H  . tamsulosin  0.4 mg Oral Daily   Continuous Infusions: . sodium chloride 1 mL/kg/hr (12/16/15 0700)   PRN Meds: sodium chloride, acetaminophen, nitroGLYCERIN, ondansetron (ZOFRAN) IV, sodium chloride flush   Vital Signs    Vitals:   12/15/15 1900 12/15/15 2000 12/15/15 2001 12/16/15 0551  BP: 158/72 (!) 160/75  115/66  Pulse: (!) 55 (!) 57  61  Resp: 15 18    Temp:  98 F (36.7 C)  97.7 F (36.5 C)  TempSrc:  Oral  Oral  SpO2: 97% 100%  98%  Weight:   191 lb 6.4 oz (86.8 kg) 191 lb 8 oz (86.9 kg)  Height:        Intake/Output Summary (Last 24 hours) at 12/16/15 1053 Last data filed at 12/15/15 2000  Gross per 24 hour  Intake              240 ml  Output                0 ml  Net              240 ml   Filed Weights   12/15/15 0819 12/15/15 2001 12/16/15 0551  Weight: 190 lb (86.2 kg) 191 lb 6.4 oz (86.8 kg) 191 lb 8 oz (86.9 kg)    Physical Exam    GEN: Well nourished, well developed, in no acute distress.  HEENT: Grossly normal.  Neck: Supple, no JVD, carotid bruits, or masses. Cardiac: RRR, no murmurs, rubs, or gallops. No clubbing, cyanosis, edema.  Radials/DP/PT 2+ and equal bilaterally.  Respiratory:  Respirations regular and unlabored, clear to auscultation bilaterally. GI: Soft, nontender, nondistended, BS + x 4. MS: no deformity or atrophy. Skin: warm and dry, no rash. Neuro:  Strength and sensation are intact. Psych: AAOx3.  Normal affect.  Labs    CBC  Recent Labs  12/15/15 0832  12/16/15 0727  WBC 6.6 6.0  HGB 13.3 13.5  HCT 38.5* 39.3  MCV 97.2 97.8  PLT 214 [id number]   Basic Metabolic Panel  Recent Labs  12/15/15 0930 12/16/15 0727  NA 137 136  K 3.9 3.9  CL 102 103  CO2 27 25  GLUCOSE 105* 109*  BUN 16 15  CREATININE 0.99 0.97  CALCIUM 9.2 9.3   Liver Function Tests No results for input(s): AST, ALT, ALKPHOS, BILITOT, PROT, ALBUMIN in the last 72 hours. No results for input(s): LIPASE, AMYLASE in the last 72 hours. Cardiac Enzymes  Recent Labs  12/15/15 2007 12/16/15 0218 12/16/15 0727  TROPONINI 0.92* 0.91* 0.65*   BNP Invalid input(s): POCBNP D-Dimer No results for input(s): DDIMER in the last 72 hours. Hemoglobin A1C No results for input(s): HGBA1C in the last 72 hours. Fasting Lipid Panel  Recent Labs  12/16/15 0218  CHOL 149  HDL 61  LDLCALC 73  TRIG 74  CHOLHDL 2.4   Thyroid Function Tests No results for input(s): TSH, T4TOTAL, T3FREE, THYROIDAB  in the last 72 hours.  Invalid input(s): FREET3  Telemetry    NSR - Personally Reviewed  ECG    NSR, nonspecific ST segment changes - Personally Reviewed  Radiology    Dg Chest 2 View  Result Date: 12/15/2015 CLINICAL DATA:  Upper extremity pain on the left with blurred vision EXAM: CHEST  2 VIEW COMPARISON:  Jun 10, 2014 FINDINGS: There is no edema or consolidation. Heart size and pulmonary vascularity are normal. There is atherosclerotic calcification in the aorta. There is calcification in the mitral annulus. Foci of coronary artery calcification noted. There is degenerative change in the thoracic spine with anterior wedging of several mid to lower thoracic vertebral bodies, stable. There is increase in kyphosis. IMPRESSION: No edema or consolidation. Aortic atherosclerosis. Foci of coronary artery calcification also noted in the left anterior descending and right coronary artery regions. Stable appearance of thoracic spine. Electronically Signed   By: Lowella Grip III  M.D.   On: 12/15/2015 09:11    Cardiac Studies     Patient Profile     75 y/o with accelerating angina episode , elevated troponin  Assessment & Plan    1) Plan for cath later today.  No Eliquis since 11/27.  Cath planned for this afternoon.    Continue losartan and Crestor for HTN and hyperlipidemia.   Signed, Larae Grooms, MD  12/16/2015, 10:53 AM

## 2015-12-16 NOTE — Consult Note (Signed)
[address]       [place] [address]             [phone]        [name] [place] Medical Record [id number] Date of Birth: 02-17-1940  Referring: Dr Fletcher Anon  Primary Care: Henrine Screws, MD Primary Cardiologist   Dr. Radford Pax  Chief Complaint:    Chief Complaint  Patient presents with  . Weakness  . Arm Pain    History of Present Illness:      [name] is a 75 yo white male with previous history of CAD S/P BMS x 2 placed in 2002. He has a significant PMH of HTN, HLD, PAF on Eliquis (last dose 11/27), bilateral carotid artery disease S/P L CEA performed by Dr. Donnetta Hutching, AAA repair with Aortofemoral bypass also performed by Dr. Donnetta Hutching, and H/O TIA.  He follows routinely with Dr. Radford Pax and during his last visit 09/2015 he was doing well.  He presented to the ED on 12/15/2015 with complaints of left arm numbness and blurred vision.  The patient stated he was in his usual state of health until the morning of presentation.  He woke up and went to get his newspaper at which time he developed left arm numbness.  He states this was similar to his symptoms he developed prior to his stents being placed in 2002.  He returned back inside and started to read the newspaper but noticed his vision was blurry.  These symptoms lasted approximately 15-20 minutes.  The patient contacted EMS and was brought to the ED for evaluation.  Upon arrival the patient deniec chest pain and shortness of breath.  He continued to have left arm numbness.  EKG was unremarkable.  Initial troponin level was negative.  His neurologic workup was also WNL.  Cardiology consult was obtained due to similarity in symptoms to when stents were placed.  They felt patient could be discharged if  repeat Troponin level was WNL.  However, this came back elevated and patient was admitted for further care.  He was taken to the cardiac catheterization lab today and was found to have a preserved EF and severe  2 vessel CAD.  The previously placed stents remained patent.  It was felt coronary bypass grafting would be indicated and TCTS consult has been requested.  Currently the patient continues to deny chest pain and shortness of breath.  He currently has no left arm numbness.  He denies history of smoking and diabetes.  He states that he is anxious about having surgery.  To his knowledge despite his extensive PVD/PAD he has not had any vein removed from his legs.       Current Activity/ Functional Status: Patient is independent with mobility/ambulation, transfers, ADL's, IADL's.   Zubrod Score: At the time of surgery this patient's most appropriate activity status/level should be described as: []     0    Normal activity, no symptoms [x]     1    Restricted in physical strenuous activity but ambulatory, able to do out light work []     2    Ambulatory and capable of self care, unable to do work activities, up and about                 more than 50%  Of the time                            []   3    Only limited self care, in bed greater than 50% of waking hours []     4    Completely disabled, no self care, confined to bed or chair []     5    Moribund  Past Medical History:  Diagnosis Date  . Arthritis    HANDS AND FEET  . Asymptomatic carotid artery stenosis BILATERAL    PER DOPPLER  01-05-10   RICA  1 - [id number]   LICA  40 - [id number] - followed by Dr. Donnetta Hutching  . CAD (coronary artery disease) 2002--  X2 BM STENTS  . Cataract immature BILATERAL   . Diverticulosis   . 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 History of diverticulosis    Noted on Colonoscopy  . History of inguinal herniorrhaphy   . Hydrocele, left   . Hyperlipidemia   . Hypertension   . Increased intraocular pressure   . Nocturia   . Paroxysmal atrial fibrillation ([place]) 06/30/14   chad2vasc score is at least 4  . Peripheral vascular disease ([place]) FOLLOWED BY DR EARLY-- VISIT NOTE 01-05-10 AND  CAROTID / VASCULAR DOPPLER  RESULTS W/ CHART   ACTIVE WALKING PROGRAM  . 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 Retinal detachment   . Stroke [facility])     Past Surgical History:  Procedure Laterality Date  . AAA REPAIR W/ AORTOBIFEMORAL BYPASS  OCT  16109  . ABDOMINAL HERNIA REPAIR  X4  (LAST ONE 1990'S)   inguinal herniorrhaphy  . CARPAL TUNNEL RELEASE Right Oct. 2013   Hand  . CORONARY ANGIOPLASTY  2002   PTCA  W/ X2 BM STENTS--   PROXIMA LAD  &  LEFT CIRCUMFLEX TO FIRST OBTUSE MARGINAL BRANCH  . ENDARTERECTOMY Left 03/11/2015   Procedure: ENDARTERECTOMY LEFT CAROTID;  Surgeon: Rosetta Posner, MD;  Location: [facility];  Service: Vascular;  Laterality: Left;  . EXPLORATORY LAPAROTOMY W/ ENTEROLYSIS FOR PARTIAL SBO  09-23-2008  . heart stents  02/19/00  . HYDROCELE EXCISION  01/24/2011   Procedure: HYDROCELECTOMY ADULT;  Surgeon: Bernestine Amass, MD;  Location: [facility];  Service: Urology;  Laterality: Left;  . Ingrown Toe Nail Right Jan. 2015   Right Great Toe nail  . intest blockage  09/22/08  . PATCH ANGIOPLASTY Left 03/11/2015   Procedure: PATCH ANGIOPLASTY LEFT CAROTID USING HEMASHIELD PLATINUM FINESSE PATCH;  Surgeon: Rosetta Posner, MD;  Location: [facility];  Service: Vascular;  Laterality: Left;  . PULLEY RELEASE -- RIGHT 5TH FINGER  2009  . RETINAL DETACHMENT SURGERY  2004   BILATERAL  . TOOTH EXTRACTION  Dec 2013    History  Smoking Status  . Former Smoker  . Years: 30.00  . Types: Cigarettes  . Quit date: 01/18/1978  Smokeless Tobacco  . Never Used    History  Alcohol Use  . 13.2 oz/week  . 1 Shots of liquor, 21 Standard drinks or equivalent per week    Comment: 2 oz of vodka per day    Social History   Social History  . Marital status: Married    Spouse name: N/A  . Number of children: N/A  . Years of education: N/A   Occupational History  . Not on file.   Social History Main Topics  . Smoking status: Former Smoker    Years: 30.00    Types:  Cigarettes    Quit date: 01/18/1978  . Smokeless tobacco: Never Used  .  Alcohol use 13.2 oz/week    1 Shots of liquor, 21 Standard drinks or equivalent per week     Comment: 2 oz of vodka per day  . Drug use: No  . Sexual activity: Not on file   Other Topics Concern  . Not on file   Social History Narrative   Pt lives in [place] with spouse.  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    Allergies  Allergen Reactions  . Wasp Venom Shortness Of Breath and Swelling    Current Facility-Administered Medications  Medication Dose Route Frequency Provider Last Rate Last Dose  . 0.9 %  sodium chloride infusion  250 mL Intravenous PRN Wellington Hampshire, MD      . 0.9 %  sodium chloride infusion   Intravenous Continuous Wellington Hampshire, MD 100 mL/hr at 12/16/15 1533    . acetaminophen (TYLENOL) tablet 650 mg  650 mg Oral Q4H PRN Cheryln Manly, NP      . aspirin EC tablet 81 mg  81 mg Oral Daily Cheryln Manly, NP      . finasteride (PROSCAR) tablet 5 mg  5 mg Oral Daily Cheryln Manly, NP   5 mg at 12/16/15 0835  . losartan (COZAAR) tablet 100 mg  100 mg Oral Daily Cheryln Manly, NP   100 mg at 12/16/15 0835  . nitroGLYCERIN (NITROSTAT) SL tablet 0.4 mg  0.4 mg Sublingual Q5 Min x 3 PRN Cheryln Manly, NP      . ondansetron [facility]) injection 4 mg  4 mg Intravenous Q6H PRN Cheryln Manly, NP      . rosuvastatin (CRESTOR) tablet 40 mg  40 mg Oral QPM Cheryln Manly, NP   40 mg at 12/16/15 0835  . sodium chloride flush (NS) 0.9 % injection 3 mL  3 mL Intravenous Q12H Wellington Hampshire, MD      . sodium chloride flush (NS) 0.9 % injection 3 mL  3 mL Intravenous PRN Wellington Hampshire, MD      . tamsulosin (FLOMAX) capsule 0.4 mg  0.4 mg Oral Daily Cheryln Manly, NP   0.4 mg at 12/16/15 [id number]    Prescriptions Prior to Admission  Medication Sig Dispense Refill Last Dose  . apixaban (ELIQUIS) 5 MG TABS tablet Take 1 tablet (5 mg total) by mouth 2 (two)  times daily. 60 tablet 6 12/14/2015 at 530p  . aspirin EC 81 MG tablet Take 81 mg by mouth daily.   12/14/2015 at Unknown time  . b complex vitamins tablet Take 1 tablet by mouth daily.     12/14/2015 at Unknown time  . calcium carbonate (OS-CAL) 600 MG TABS Take 600 mg by mouth daily.     12/14/2015 at Unknown time  . Cholecalciferol 1000 UNITS capsule Take 2,000 Units by mouth daily.    12/14/2015 at Unknown time  . Coenzyme Q10 (CO Q 10) 100 MG CAPS Take 1 capsule by mouth daily.   12/14/2015 at Unknown time  . diltiazem (TIAZAC) 300 MG 24 hr capsule Take 300 mg by mouth as needed.   06-2015 at Unknown time  . finasteride (PROSCAR) 5 MG tablet Take 5 mg by mouth daily.   12/14/2015 at Unknown time  . hydrochlorothiazide (HYDRODIURIL) 25 MG tablet Take 25 mg by mouth daily.     12/14/2015 at Unknown time  . losartan (COZAAR) 100 MG tablet Take 100 mg by mouth daily.   12/14/2015 at Unknown  time  . Magnesium Hydroxide (MAGNESIA PO) Take 1,000 mg by mouth daily.    12/14/2015 at Unknown time  . Multiple Vitamin (MULTIVITAMIN) tablet Take 1 tablet by mouth daily.     12/14/2015 at Unknown time  . niacinamide 500 MG tablet Take 500 mg by mouth daily.   12/14/2015 at Unknown time  . Omega-3 Fatty Acids (FISH OIL PO) Take 1,200 mg by mouth daily.   12/14/2015 at Unknown time  . oxyCODONE (ROXICODONE) 5 MG immediate release tablet Take 1 tablet (5 mg total) by mouth every 6 (six) hours as needed. 20 tablet 0 Past Month at Unknown time  . potassium gluconate 595 (99 K) MG TABS tablet Take 595 mg by mouth.   12/14/2015 at Unknown time  . rosuvastatin (CRESTOR) 40 MG tablet Take 1 tablet (40 mg total) by mouth every evening. 90 tablet 3 12/14/2015 at Unknown time  . Tamsulosin HCl (FLOMAX) 0.4 MG CAPS Take 0.4 mg by mouth daily.     12/14/2015 at Unknown time  . vitamin C (ASCORBIC ACID) 500 MG tablet Take 2,000 mg by mouth daily.   12/14/2015 at Unknown time  . cyanocobalamin 1000 MCG tablet Take 1,000  mcg by mouth daily.   Not Taking at Unknown time  . EPINEPHrine (EPIPEN) 0.3 mg/0.3 mL SOAJ injection Inject 0.3 mg into the muscle daily as needed (allergic reaction).    unk    Family History  Problem Relation Age of Onset  . Heart disease Mother     AAA  Before age 63  . Cancer Mother 15    Colon cancer  . Hyperlipidemia Mother   . Heart failure Father      Review of Systems:      Cardiac Review of Systems: Y or N  Chest Pain [ n - left arm pain similar to pain when he had coronary stents placed   ]  Resting SOB [ n  ] Exertional SOB  [n  ]  Orthopnea [  ]   Pedal Edema [   ]    Palpitations Blue.Reese  ] Syncope  [n  ]   Presyncope [   ]  General Review of Systems: [Y] = yes [  ]=no Constitional: recent weight change [  ]; anorexia [  ]; fatigue [n  ]; nausea [n  ]; night sweats [  ]; fever [  ]; or chills [  ]                                                               Dental: poor dentition[  ]; Last Dentist visit:   Eye : blurred vision Blue.Reese  ]; diplopia [   ]; vision changes [  ];  Amaurosis fugax[  ]; Resp: cough [  ];  wheezing[ n ];  hemoptysis[  ]; shortness of breath[n  ]; paroxysmal nocturnal dyspnea[ n ]; dyspnea on exertion[ n ]; or orthopnea[  ];  GI:  gallstones[  ], vomiting[n  ];  dysphagia[ y ]; melena[  ];  hematochezia [  ]; heartburn[  ];   Hx of  Colonoscopy[  ]; GU: kidney stones [  ]; hematuria[  ];   dysuria [  ];  nocturia[  ];  history of  obstruction [  ]; urinary frequency [  ]             Skin: rash, swelling[n  ];, hair loss[  ];  peripheral edema[n  ];  or itching[  ]; Musculosketetal: myalgias[  ];  joint swelling[  ];  joint erythema[  ];  joint pain[  ];  back pain[  ];  Heme/Lymph: bruising[  ];  bleeding[  ];  anemia[  ];  Neuro: TIA[ y ];  headaches[  ];  stroke[  ];  vertigo[  ];  seizures[  ];   paresthesias[  ];  difficulty walking[n  ];  Psych:depression[  ]; anxiety[  ];  Endocrine: diabetes[ n ];  thyroid dysfunction[n  ];  Immunizations:  Flu [  ]; Pneumococcal[  ];  Other:  Physical Exam: BP (!) 150/79   Pulse (!) 0   Temp 97.7 F (36.5 C) (Oral)   Resp (!) 0   Ht 6\' 1"  (1.854 m)   Wt 191 lb 8 oz (86.9 kg)   SpO2 (!) 0%   BMI 25.27 kg/m    General appearance: alert, cooperative and no distress Head: Normocephalic, without obvious abnormality, atraumatic Neck: no adenopathy, no carotid bruit, no JVD, supple, symmetrical, trachea midline and thyroid not enlarged, symmetric, no tenderness/mass/nodules Resp: clear to auscultation bilaterally Cardio: regular rate and rhythm GI: soft, non-tender; bowel sounds normal; no masses,  no organomegaly Extremities: extremities normal, atraumatic, no cyanosis or edema Neurologic: Grossly normal Pt and dp pulses not palpable   Diagnostic Studies & Laboratory data:     Recent Radiology Findings:   Dg Chest 2 View  Result Date: 12/15/2015 CLINICAL DATA:  Upper extremity pain on the left with blurred vision EXAM: CHEST  2 VIEW COMPARISON:  Jun 10, 2014 FINDINGS: There is no edema or consolidation. Heart size and pulmonary vascularity are normal. There is atherosclerotic calcification in the aorta. There is calcification in the mitral annulus. Foci of coronary artery calcification noted. There is degenerative change in the thoracic spine with anterior wedging of several mid to lower thoracic vertebral bodies, stable. There is increase in kyphosis. IMPRESSION: No edema or consolidation. Aortic atherosclerosis. Foci of coronary artery calcification also noted in the left anterior descending and right coronary artery regions. Stable appearance of thoracic spine. Electronically Signed   By: Lowella Grip III M.D.   On: 12/15/2015 09:11     I have independently reviewed the above radiologic studies.  Recent Lab Findings: Lab Results  Component Value Date   WBC 6.0 12/16/2015   HGB 13.5 12/16/2015   HCT 39.3 12/16/2015   PLT 225 12/16/2015   GLUCOSE 109 (H) 12/16/2015   CHOL  149 12/16/2015   TRIG 74 12/16/2015   HDL 61 12/16/2015   LDLCALC 73 12/16/2015   ALT 20 10/14/2015   AST 28 10/14/2015   NA 136 12/16/2015   K 3.9 12/16/2015   CL 103 12/16/2015   CREATININE 0.97 12/16/2015   BUN 15 12/16/2015   CO2 25 12/16/2015   TSH 1.534 06/11/2014   INR 1.15 12/16/2015   HGBA1C 5.6 02/22/2015   Lab Results  Component Value Date   TROPONINI 0.65 ([place]) 12/16/2015     Cath: 12/16/2015 Coronary Findings   Dominance: Right  Left Main  Ost LM to LM lesion, 70% stenosed. The lesion is eccentric. The lesion is severely calcified.  LM lesion, 60% stenosed.  Left Anterior Descending  Ost LAD to Prox LAD lesion, 30% stenosed. The lesion was  previously treated using a bare metal stent over 2 years ago.  First Diagonal Branch  Ost 1st Diag to 1st Diag lesion, 20% stenosed.  Second Diagonal Branch  The vessel exhibits minimal luminal irregularities.  Third Diagonal Branch  Vessel is small in size. The vessel exhibits minimal luminal irregularities.  Left Circumflex  First Obtuse Marginal Branch  Ost 1st Mrg to 1st Mrg lesion, 40% stenosed. The lesion was previously treated using a bare metal stent over 2 years ago.  Right Coronary Artery  Prox RCA lesion, 70% stenosed. The lesion is moderately calcified.  Right Posterior Descending Artery  The vessel exhibits minimal luminal irregularities.  Right Posterior Atrioventricular Branch  Vessel is angiographically normal.  First Right Posterolateral  Vessel is angiographically normal.  Second Right Posterolateral  Vessel is angiographically normal.  Third Right Posterolateral  Vessel is angiographically normal.  Wall Motion              Left Heart   Left Ventricle The left ventricular size is normal. The left ventricular systolic function is normal. LV end diastolic pressure is normal. The left ventricular ejection fraction is 55-65% by visual estimate. No regional wall motion abnormalities.    Mitral  Valve The annulus is calcified.    Aortic Valve There is no aortic valve stenosis.    Coronary Diagrams   Diagnostic Diagram      Conclusion     The left ventricular systolic function is normal.  LV end diastolic pressure is normal.  The left ventricular ejection fraction is 55-65% by visual estimate.  There is no aortic valve stenosis.  Prox RCA lesion, 70 %stenosed.  LM lesion, 60 %stenosed.  Ost LM to LM lesion, 70 %stenosed.  Ost 1st Mrg to 1st Mrg lesion, 40 %stenosed.  Ost LAD to Prox LAD lesion, 30 %stenosed.  Ost 1st Diag to 1st Diag lesion, 20 %stenosed.   1. Significant left main and RCA disease. Patent stents in the LAD and OM1. 2. Normal LV systolic function with normal left ventricular end-diastolic pressure. 3. Severe mitral annular calcifications.  Recommendations: The patient had significant EKG changes and pressure dampening with catheter engagement into the left main coronary artery. There are 2 stenosis in the left main one is ostial and the other is distal. Both are heavily calcified and eccentric. I recommend CABG. I consulted CTS.       I have independently reviewed the above  cath films and reviewed the findings with the  patient .     ECHO: 02/2015  Transthoracic Echocardiography  (Report amended )  Patient:    Nahim, Swiney MR #:       [id number] Study Date: 02/23/2015 Gender:     M Age:        39 Height:     188 cm Weight:     85.3 kg BSA:        2.11 m^2 Pt. Status: Room:       5C02C   ADMITTING    Theressa Millard  ORDERING     Rinehuls, Sherwood Gambler    Marene Lenz [id number]  PERFORMING   [facility], Inpatient  SONOGRAPHER  Mikki Santee  cc:  ------------------------------------------------------------------- LV EF: 60% -   65%  ------------------------------------------------------------------- Indications:      CVA  60.  ------------------------------------------------------------------- History:   PMH:   Atrial fibrillation.  Coronary artery disease. Risk factors:  Hypertension. Dyslipidemia.  ------------------------------------------------------------------- Study Conclusions  -  Left ventricle: The cavity size was normal. Wall thickness was   increased in a pattern of moderate LVH. Systolic function was   normal. The estimated ejection fraction was in the range of 60%   to 65%. Wall motion was normal; there were no regional wall   motion abnormalities. Doppler parameters are consistent with   abnormal left ventricular relaxation (grade 1 diastolic   dysfunction). The E/e&' ratio is between 8-15, suggesting   indeteminate LV filling pressure. - Aortic valve: Trileaflet; mildly calcified leaflets. There was no   stenosis. There was no significant regurgitation. - Mitral valve: Moderate posterior calcified annulus. Mildly   thickened leaflets . - Left atrium: Severely dilated at 58 ml/m2. - Right ventricle: The cavity size was mildly dilated. Systolic   function was normal. - Right atrium: The atrium was mildly dilated. - Atrial septum: Aneurysmal IAS - PFO cannot be excluded. - Tricuspid valve: There was mild regurgitation. - Pulmonary arteries: PA peak pressure: 26 mm Hg (S). - Inferior vena cava: The vessel was normal in size. The   respirophasic diameter changes were in the normal range (= 50%),   consistent with normal central venous pressure.  Impressions:  - Compared with a prior study in 07/2014, there are few changes   except there is now severe LAE. An atrial septal aneurysm is   noted, PFO cannot be excluded.  Transthoracic echocardiography.  M-mode, complete 2D, spectral Doppler, and color Doppler.  Birthdate:  Patient birthdate: Jul 07, 1940.  Age:  Patient is 75 yr old.  Sex:  Gender: male. BMI: 24.1 kg/m^2.  Blood pressure:     119/56  Patient status: Inpatient.   Study date:  Study date: 02/23/2015. Study time: 08:23 AM.  Location:  Echo laboratory.  -------------------------------------------------------------------  ------------------------------------------------------------------- Left ventricle:  The cavity size was normal. Wall thickness was increased in a pattern of moderate LVH. Systolic function was normal. The estimated ejection fraction was in the range of 60% to 65%. Wall motion was normal; there were no regional wall motion abnormalities. Doppler parameters are consistent with abnormal left ventricular relaxation (grade 1 diastolic dysfunction). The E/e&' ratio is between 8-15, suggesting indeteminate LV filling pressure.   ------------------------------------------------------------------- Aortic valve:   Trileaflet; mildly calcified leaflets.  Doppler: There was no stenosis.   There was no significant regurgitation.   ------------------------------------------------------------------- Aorta:  Aortic root: The aortic root was normal in size. Ascending aorta: The ascending aorta was normal in size.  ------------------------------------------------------------------- Mitral valve:  Moderate posterior calcified annulus. Mildly thickened leaflets .  Doppler:  There was trivial regurgitation.   ------------------------------------------------------------------- Left atrium:  Severely dilated at 58 ml/m2.  ------------------------------------------------------------------- Atrial septum:  Aneurysmal IAS - PFO cannot be excluded.  ------------------------------------------------------------------- Right ventricle:  The cavity size was mildly dilated. The moderator band was prominent. Systolic function was normal.  ------------------------------------------------------------------- Pulmonic valve:    The valve appears to be grossly normal. Doppler:  There was trivial  regurgitation.  ------------------------------------------------------------------- Tricuspid valve:   Doppler:  There was mild regurgitation.  ------------------------------------------------------------------- Pulmonary artery:   The main pulmonary artery was normal-sized.  ------------------------------------------------------------------- Right atrium:  The atrium was mildly dilated.  ------------------------------------------------------------------- Pericardium:  There was no pericardial effusion.  ------------------------------------------------------------------- Systemic veins: Inferior vena cava: The vessel was normal in size. The respirophasic diameter changes were in the normal range (= 50%), consistent with normal central venous pressure.  ------------------------------------------------------------------- Measurements   Left ventricle  Value        Reference  LV ID, ED, PLAX chordal                  52    mm     43 - 52  LV ID, ES, PLAX chordal                  34.3  mm     23 - 38  LV fx shortening, PLAX chordal           34    %      >=29  LV PW thickness, ED                      11.7  mm     ---------  IVS/LV PW ratio, ED                      1.03         <=1.3  LV ejection fraction, 1-p A4C            64    %      ---------  LV end-diastolic volume, 2-p             89    ml     ---------  LV end-systolic volume, 2-p              33    ml     ---------  LV ejection fraction, 2-p                63    %      ---------  Stroke volume, 2-p                       57    ml     ---------  LV end-diastolic volume/bsa, 2-p         42    ml/m^2 ---------  LV end-systolic volume/bsa, 2-p          15    ml/m^2 ---------  Stroke volume/bsa, 2-p                   26.8  ml/m^2 ---------  LV e&', lateral                           8.7   cm/s   ---------  LV E/e&', lateral                         8.02         ---------  LV e&', medial                             5.11  cm/s   ---------  LV E/e&', medial                          13.66        ---------  LV e&', average                           6.91  cm/s   ---------  LV E/e&', average  10.11        ---------    Ventricular septum                       Value        Reference  IVS thickness, ED                        12    mm     ---------    LVOT                                     Value        Reference  LVOT ID, S                               25    mm     ---------  LVOT area                                4.91  cm^2   ---------    Aorta                                    Value        Reference  Aortic root ID, ED                       32    mm     ---------    Left atrium                              Value        Reference  LA ID, A-P, ES                           45    mm     ---------  LA ID/bsa, A-P                           2.13  cm/m^2 <=2.2  LA volume, S                             122   ml     ---------  LA volume/bsa, S                         57.7  ml/m^2 ---------  LA volume, ES, 1-p A4C                   105   ml     ---------  LA volume/bsa, ES, 1-p A4C               49.7  ml/m^2 ---------  LA volume, ES, 1-p A2C                   140   ml     ---------  LA volume/bsa, ES, 1-p A2C  66.3  ml/m^2 ---------    Mitral valve                             Value        Reference  Mitral E-wave peak velocity              69.8  cm/s   ---------  Mitral A-wave peak velocity              82.9  cm/s   ---------  Mitral deceleration time         (H)     303   ms     150 - 230  Mitral E/A ratio, peak                   0.8          ---------    Pulmonary arteries                       Value        Reference  PA pressure, S, DP                       26    mm Hg  <=30    Tricuspid valve                          Value        Reference  Tricuspid regurg peak velocity           240   cm/s   ---------  Tricuspid peak RV-RA gradient            23     mm Hg  ---------    Systemic veins                           Value        Reference  Estimated CVP                            3     mm Hg  ---------    Right ventricle                          Value        Reference  RV pressure, S, DP                       26    mm Hg  <=30  RV s&', lateral, S                        12.1  cm/s   ---------  Legend: (L)  and  (H)  mark values outside specified reference range.  ------------------------------------------------------------------- Gaspar Skeeters MD 2017-02-06T10:31:34   Assessment / Plan:      Elevated Troponin cw with acute MI and now with left main and right cad Severely dilated LA at 58 ml/m2. 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 Chronic anticoagulation on Eliquis  Plan CABG Friday consider atrial clip  - repeat echo before   PAF- off Eliquis since 11/27.. Currently Sinus Bradycardia-sinus  Extensive PVD/PAD--- no veins removed from legs, will need vein  mapping  I have reviewed with the patient and his wife the findings at cath and there recommendation for CABG  I  spent 40 minutes counseling the patient face to face and 50% or more the  time was spent in counseling and coordination of care. The total time spent in the appointment was 60 minutes.    Grace Isaac MD      [address] [place] [address] Office [phone]   Beeper [phone]  12/16/2015 3:58 PM

## 2015-12-16 NOTE — Progress Notes (Signed)
Patient Name: [name] Date of Encounter: 12/16/2015  Primary Cardiologist: Dr. Sundra Aland Problem List     Active Problems:   NSTEMI (non-ST elevated myocardial infarction) [facility])     Subjective   No further chest pain  Inpatient Medications    Scheduled Meds: . aspirin EC  81 mg Oral Daily  . finasteride  5 mg Oral Daily  . losartan  100 mg Oral Daily  . rosuvastatin  40 mg Oral QPM  . sodium chloride flush  3 mL Intravenous Q12H  . tamsulosin  0.4 mg Oral Daily   Continuous Infusions: . sodium chloride 1 mL/kg/hr (12/16/15 0700)   PRN Meds: sodium chloride, acetaminophen, nitroGLYCERIN, ondansetron (ZOFRAN) IV, sodium chloride flush   Vital Signs    Vitals:   12/15/15 1900 12/15/15 2000 12/15/15 2001 12/16/15 0551  BP: 158/72 (!) 160/75  115/66  Pulse: (!) 55 (!) 57  61  Resp: 15 18    Temp:  98 F (36.7 C)  97.7 F (36.5 C)  TempSrc:  Oral  Oral  SpO2: 97% 100%  98%  Weight:   191 lb 6.4 oz (86.8 kg) 191 lb 8 oz (86.9 kg)  Height:        Intake/Output Summary (Last 24 hours) at 12/16/15 1053 Last data filed at 12/15/15 2000  Gross per 24 hour  Intake              240 ml  Output                0 ml  Net              240 ml   Filed Weights   12/15/15 0819 12/15/15 2001 12/16/15 0551  Weight: 190 lb (86.2 kg) 191 lb 6.4 oz (86.8 kg) 191 lb 8 oz (86.9 kg)    Physical Exam    GEN: Well nourished, well developed, in no acute distress.  HEENT: Grossly normal.  Neck: Supple, no JVD, carotid bruits, or masses. Cardiac: RRR, no murmurs, rubs, or gallops. No clubbing, cyanosis, edema.  Radials/DP/PT 2+ and equal bilaterally.  Respiratory:  Respirations regular and unlabored, clear to auscultation bilaterally. GI: Soft, nontender, nondistended, BS + x 4. MS: no deformity or atrophy. Skin: warm and dry, no rash. Neuro:  Strength and sensation are intact. Psych: AAOx3.  Normal affect.  Labs    CBC  Recent Labs  12/15/15 0832  12/16/15 0727  WBC 6.6 6.0  HGB 13.3 13.5  HCT 38.5* 39.3  MCV 97.2 97.8  PLT 214 [id number]   Basic Metabolic Panel  Recent Labs  12/15/15 0930 12/16/15 0727  NA 137 136  K 3.9 3.9  CL 102 103  CO2 27 25  GLUCOSE 105* 109*  BUN 16 15  CREATININE 0.99 0.97  CALCIUM 9.2 9.3   Liver Function Tests No results for input(s): AST, ALT, ALKPHOS, BILITOT, PROT, ALBUMIN in the last 72 hours. No results for input(s): LIPASE, AMYLASE in the last 72 hours. Cardiac Enzymes  Recent Labs  12/15/15 2007 12/16/15 0218 12/16/15 0727  TROPONINI 0.92* 0.91* 0.65*   BNP Invalid input(s): POCBNP D-Dimer No results for input(s): DDIMER in the last 72 hours. Hemoglobin A1C No results for input(s): HGBA1C in the last 72 hours. Fasting Lipid Panel  Recent Labs  12/16/15 0218  CHOL 149  HDL 61  LDLCALC 73  TRIG 74  CHOLHDL 2.4   Thyroid Function Tests No results for input(s): TSH, T4TOTAL, T3FREE, THYROIDAB  in the last 72 hours.  Invalid input(s): FREET3  Telemetry    NSR - Personally Reviewed  ECG    NSR, nonspecific ST segment changes - Personally Reviewed  Radiology    Dg Chest 2 View  Result Date: 12/15/2015 CLINICAL DATA:  Upper extremity pain on the left with blurred vision EXAM: CHEST  2 VIEW COMPARISON:  Jun 10, 2014 FINDINGS: There is no edema or consolidation. Heart size and pulmonary vascularity are normal. There is atherosclerotic calcification in the aorta. There is calcification in the mitral annulus. Foci of coronary artery calcification noted. There is degenerative change in the thoracic spine with anterior wedging of several mid to lower thoracic vertebral bodies, stable. There is increase in kyphosis. IMPRESSION: No edema or consolidation. Aortic atherosclerosis. Foci of coronary artery calcification also noted in the left anterior descending and right coronary artery regions. Stable appearance of thoracic spine. Electronically Signed   By: Lowella Grip III  M.D.   On: 12/15/2015 09:11    Cardiac Studies     Patient Profile     75 y/o with accelerating angina episode , elevated troponin  Assessment & Plan    1) Plan for cath later today.  No Eliquis since 11/27.  Cath planned for this afternoon.    Continue losartan and Crestor for HTN and hyperlipidemia.   Signed, Larae Grooms, MD  12/16/2015, 10:53 AM

## 2015-12-16 NOTE — Progress Notes (Signed)
ANTICOAGULATION CONSULT NOTE - Initial Consult  Pharmacy Consult for Heparin Indication: NSTEMI, CAD, planning CABG  Allergies  Allergen Reactions  . Wasp Venom Shortness Of Breath and Swelling    Patient Measurements: Height: 6\' 1"  (185.4 cm) Weight: 191 lb 8 oz (86.9 kg) IBW/kg (Calculated) : 79.9 Heparin Dosing Weight: 87 kg  Vital Signs: Temp: 97.7 F (36.5 C) (11/29 2030) Temp Source: Oral (11/29 2030) BP: 125/65 (11/29 2030) Pulse Rate: 59 (11/29 2030)  Labs:  Recent Labs  12/15/15 0832 12/15/15 0930 12/15/15 2007 12/16/15 0218 12/16/15 0727  HGB 13.3  --   --   --  13.5  HCT 38.5*  --   --   --  39.3  PLT 214  --   --   --  225  LABPROT  --   --   --   --  14.8  INR  --   --   --   --  1.15  CREATININE  --  0.99  --   --  0.97  TROPONINI  --   --  0.92* 0.91* 0.65*    Estimated Creatinine Clearance: 72.1 mL/min (by C-G formula based on SCr of 0.97 mg/dL).   Medical History: Past Medical History:  Diagnosis Date  . Arthritis    HANDS AND FEET  . Asymptomatic carotid artery stenosis BILATERAL    PER DOPPLER  01-05-10   RICA  1 - [id number]   LICA  40 - [id number] - followed by Dr. Donnetta Hutching  . CAD (coronary artery disease) 2002--  X2 BM STENTS  . Cataract immature BILATERAL   . Diverticulosis   . 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 History of diverticulosis    Noted on Colonoscopy  . History of inguinal herniorrhaphy   . Hydrocele, left   . Hyperlipidemia   . Hypertension   . Increased intraocular pressure   . Nocturia   . Paroxysmal atrial fibrillation ([place]) 06/30/14   chad2vasc score is at least 4  . Peripheral vascular disease ([place]) FOLLOWED BY DR EARLY-- VISIT NOTE 01-05-10 AND CAROTID / VASCULAR DOPPLER  RESULTS W/ CHART   ACTIVE WALKING PROGRAM  . 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 Retinal detachment   . Stroke [facility])     Assessment:  75 yr old male s/p cardiac cath today, to begin IV heparin 8 hrs  after sheath out.  Multi-vessed CAD.  Planning CABG on 12/18/15. TR band deflation completed ~7pm.  RN reports no problems at site.   Was on Eliquis 5 mg BID prior to admission for atrial fibrillation. Last Eliquis dose 11/27 at 5:30pm, so may still effect heparin levels to some degree.  No baseline PTT  Goal of Therapy:  Heparin level 0.3-0.7 units/ml aPTT 66-102 seconds Monitor platelets by anticoagulation protocol: Yes   Plan:   Begin heparin drip at ~3am on 12/17/15 at 1200 units/hr.  Heparin level and aPTT ~ 6 hrs after drip begins.  Daily heparin level and CBC while on heparin.  Will add additional aPTTs if heparin levels and aPTT are not correlating due to recent Eliquis doses.  Arty Baumgartner, [place] Pager: [phone] 12/16/2015,8:53 PM

## 2015-12-17 ENCOUNTER — Inpatient Hospital Stay (HOSPITAL_COMMUNITY): Payer: PPO

## 2015-12-17 ENCOUNTER — Encounter (HOSPITAL_COMMUNITY): Payer: Self-pay | Admitting: Cardiovascular Disease

## 2015-12-17 DIAGNOSIS — I739 Peripheral vascular disease, unspecified: Secondary | ICD-10-CM | POA: Diagnosis not present

## 2015-12-17 DIAGNOSIS — I2510 Atherosclerotic heart disease of native coronary artery without angina pectoris: Secondary | ICD-10-CM | POA: Diagnosis not present

## 2015-12-17 DIAGNOSIS — Z01810 Encounter for preprocedural cardiovascular examination: Secondary | ICD-10-CM

## 2015-12-17 DIAGNOSIS — Z79899 Other long term (current) drug therapy: Secondary | ICD-10-CM | POA: Diagnosis not present

## 2015-12-17 DIAGNOSIS — I214 Non-ST elevation (NSTEMI) myocardial infarction: Secondary | ICD-10-CM | POA: Diagnosis not present

## 2015-12-17 DIAGNOSIS — I25110 Atherosclerotic heart disease of native coronary artery with unstable angina pectoris: Secondary | ICD-10-CM | POA: Diagnosis not present

## 2015-12-17 DIAGNOSIS — D62 Acute posthemorrhagic anemia: Secondary | ICD-10-CM | POA: Diagnosis not present

## 2015-12-17 DIAGNOSIS — I10 Essential (primary) hypertension: Secondary | ICD-10-CM | POA: Diagnosis not present

## 2015-12-17 DIAGNOSIS — J9811 Atelectasis: Secondary | ICD-10-CM | POA: Diagnosis not present

## 2015-12-17 DIAGNOSIS — E785 Hyperlipidemia, unspecified: Secondary | ICD-10-CM | POA: Diagnosis not present

## 2015-12-17 DIAGNOSIS — E8770 Fluid overload, unspecified: Secondary | ICD-10-CM | POA: Diagnosis not present

## 2015-12-17 DIAGNOSIS — I480 Paroxysmal atrial fibrillation: Secondary | ICD-10-CM | POA: Diagnosis not present

## 2015-12-17 DIAGNOSIS — Q211 Atrial septal defect: Secondary | ICD-10-CM | POA: Diagnosis not present

## 2015-12-17 DIAGNOSIS — I481 Persistent atrial fibrillation: Secondary | ICD-10-CM | POA: Diagnosis not present

## 2015-12-17 DIAGNOSIS — I253 Aneurysm of heart: Secondary | ICD-10-CM | POA: Diagnosis not present

## 2015-12-17 LAB — VAS US DOPPLER PRE CABG
LEFT ECA DIAS: -4 cm/s
LEFT VERTEBRAL DIAS: 10 cm/s
Left CCA dist dias: 18 cm/s
Left CCA dist sys: 90 cm/s
Left CCA prox dias: 20 cm/s
Left CCA prox sys: 110 cm/s
Left ICA dist dias: -27 cm/s
Left ICA dist sys: -99 cm/s
Left ICA prox dias: -28 cm/s
Left ICA prox sys: -106 cm/s
RIGHT ECA DIAS: -17 cm/s
RIGHT VERTEBRAL DIAS: -1 cm/s
Right CCA prox dias: 11 cm/s
Right CCA prox sys: 88 cm/s
Right cca dist sys: -123 cm/s

## 2015-12-17 LAB — PULMONARY FUNCTION TEST
DL/VA % pred: 89 %
DL/VA: 4.25 ml/min/mmHg/L
DLCO unc % pred: 66 %
DLCO unc: 24.05 ml/min/mmHg
FEF 25-75 Post: 1.83 L/sec
FEF 25-75 Pre: 1.36 L/sec
FEF2575-%Change-Post: 34 %
FEF2575-%Pred-Post: 75 %
FEF2575-%Pred-Pre: 56 %
FEV1-%Change-Post: 6 %
FEV1-%Pred-Post: 79 %
FEV1-%Pred-Pre: 74 %
FEV1-Post: 2.68 L
FEV1-Pre: 2.52 L
FEV1FVC-%Change-Post: 5 %
FEV1FVC-%Pred-Pre: 95 %
FEV6-%Change-Post: 0 %
FEV6-%Pred-Post: 82 %
FEV6-%Pred-Pre: 82 %
FEV6-Post: 3.63 L
FEV6-Pre: 3.6 L
FEV6FVC-%Change-Post: 0 %
FEV6FVC-%Pred-Post: 105 %
FEV6FVC-%Pred-Pre: 104 %
FVC-%Change-Post: 0 %
FVC-%Pred-Post: 79 %
FVC-%Pred-Pre: 78 %
FVC-Post: 3.7 L
FVC-Pre: 3.66 L
Post FEV1/FVC ratio: 73 %
Post FEV6/FVC ratio: 99 %
Pre FEV1/FVC ratio: 69 %
Pre FEV6/FVC Ratio: 98 %
RV % pred: 102 %
RV: 2.84 L
TLC % pred: 85 %
TLC: 6.57 L

## 2015-12-17 LAB — URINALYSIS, ROUTINE W REFLEX MICROSCOPIC
Bilirubin Urine: NEGATIVE
Glucose, UA: NEGATIVE mg/dL
Hgb urine dipstick: NEGATIVE
Ketones, ur: 15 mg/dL — AB
Leukocytes, UA: NEGATIVE
Nitrite: NEGATIVE
Protein, ur: NEGATIVE mg/dL
Specific Gravity, Urine: 1.023 (ref 1.005–1.030)
pH: 6.5 (ref 5.0–8.0)

## 2015-12-17 LAB — COMPREHENSIVE METABOLIC PANEL
ALT: 15 U/L — ABNORMAL LOW (ref 17–63)
AST: 21 U/L (ref 15–41)
Albumin: 3 g/dL — ABNORMAL LOW (ref 3.5–5.0)
Alkaline Phosphatase: 37 U/L — ABNORMAL LOW (ref 38–126)
Anion gap: 8 (ref 5–15)
BUN: 12 mg/dL (ref 6–20)
CO2: 24 mmol/L (ref 22–32)
Calcium: 9 mg/dL (ref 8.9–10.3)
Chloride: 105 mmol/L (ref 101–111)
Creatinine, Ser: 0.84 mg/dL (ref 0.61–1.24)
GFR calc Af Amer: >60 mL/min (ref >60)
GFR calc non Af Amer: >60 mL/min (ref >60)
Glucose, Bld: 95 mg/dL (ref 65–99)
Potassium: 3.6 mmol/L (ref 3.5–5.1)
Sodium: 137 mmol/L (ref 135–145)
Total Bilirubin: 0.5 mg/dL (ref 0.3–1.2)
Total Protein: 6 g/dL — ABNORMAL LOW (ref 6.5–8.1)

## 2015-12-17 LAB — CBC
HCT: 37.9 % — ABNORMAL LOW (ref 39.0–52.0)
Hemoglobin: 12.9 g/dL — ABNORMAL LOW (ref 13.0–17.0)
MCH: 33.2 pg (ref 26.0–34.0)
MCHC: 34 g/dL (ref 30.0–36.0)
MCV: 97.4 fL (ref 78.0–100.0)
Platelets: 221 10*3/uL (ref 150–400)
RBC: 3.89 MIL/uL — ABNORMAL LOW (ref 4.22–5.81)
RDW: 12.2 % (ref 11.5–15.5)
WBC: 5.2 10*3/uL (ref 4.0–10.5)

## 2015-12-17 LAB — SURGICAL PCR SCREEN
MRSA, PCR: NEGATIVE
Staphylococcus aureus: NEGATIVE

## 2015-12-17 LAB — PROTIME-INR
INR: 1.12
Prothrombin Time: 14.5 seconds (ref 11.4–15.2)

## 2015-12-17 LAB — TYPE AND SCREEN
ABO/RH(D): O POS
Antibody Screen: NEGATIVE

## 2015-12-17 LAB — HEPARIN LEVEL (UNFRACTIONATED): Heparin Unfractionated: 0.3 IU/mL (ref 0.30–0.70)

## 2015-12-17 MED ORDER — ALBUTEROL SULFATE (2.5 MG/3ML) 0.083% IN NEBU
2.5000 mg | INHALATION_SOLUTION | Freq: Once | RESPIRATORY_TRACT | Status: AC
  Administered 2015-12-17: 2.5 mg via RESPIRATORY_TRACT

## 2015-12-17 MED ORDER — METOPROLOL TARTRATE 12.5 MG HALF TABLET
12.5000 mg | ORAL_TABLET | Freq: Once | ORAL | Status: AC
  Administered 2015-12-18: 12.5 mg via ORAL
  Filled 2015-12-17: qty 1

## 2015-12-17 MED ORDER — NITROGLYCERIN IN D5W 200-5 MCG/ML-% IV SOLN
2.0000 ug/min | INTRAVENOUS | Status: AC
  Administered 2015-12-18: 50 ug/min via INTRAVENOUS
  Filled 2015-12-17: qty 250

## 2015-12-17 MED ORDER — CHLORHEXIDINE GLUCONATE CLOTH 2 % EX PADS
6.0000 | MEDICATED_PAD | Freq: Once | CUTANEOUS | Status: AC
  Administered 2015-12-18: 6 via TOPICAL

## 2015-12-17 MED ORDER — TRANEXAMIC ACID (OHS) PUMP PRIME SOLUTION
2.0000 mg/kg | INTRAVENOUS | Status: DC
  Filled 2015-12-17: qty 1.74

## 2015-12-17 MED ORDER — DEXTROSE 5 % IV SOLN
750.0000 mg | INTRAVENOUS | Status: DC
  Filled 2015-12-17: qty 750

## 2015-12-17 MED ORDER — DOPAMINE-DEXTROSE 3.2-5 MG/ML-% IV SOLN
0.0000 ug/kg/min | INTRAVENOUS | Status: DC
  Filled 2015-12-17: qty 250

## 2015-12-17 MED ORDER — DEXTROSE 5 % IV SOLN
30.0000 ug/min | INTRAVENOUS | Status: AC
  Administered 2015-12-18: 30 ug/min via INTRAVENOUS
  Filled 2015-12-17: qty 2

## 2015-12-17 MED ORDER — TRANEXAMIC ACID 1000 MG/10ML IV SOLN
1.5000 mg/kg/h | INTRAVENOUS | Status: AC
  Administered 2015-12-18: 1.5 mg/kg/h via INTRAVENOUS
  Filled 2015-12-17: qty 25

## 2015-12-17 MED ORDER — SODIUM CHLORIDE 0.9 % IV SOLN
INTRAVENOUS | Status: DC
  Filled 2015-12-17: qty 30

## 2015-12-17 MED ORDER — CHLORHEXIDINE GLUCONATE CLOTH 2 % EX PADS
6.0000 | MEDICATED_PAD | Freq: Once | CUTANEOUS | Status: AC
  Administered 2015-12-17: 6 via TOPICAL

## 2015-12-17 MED ORDER — POTASSIUM CHLORIDE 2 MEQ/ML IV SOLN
80.0000 meq | INTRAVENOUS | Status: DC
  Filled 2015-12-17: qty 40

## 2015-12-17 MED ORDER — DEXTROSE 5 % IV SOLN
1.5000 g | INTRAVENOUS | Status: AC
  Administered 2015-12-18: 1.5 g via INTRAVENOUS
  Administered 2015-12-18: .75 g via INTRAVENOUS
  Filled 2015-12-17: qty 1.5

## 2015-12-17 MED ORDER — VANCOMYCIN HCL 10 G IV SOLR
1250.0000 mg | INTRAVENOUS | Status: AC
  Administered 2015-12-18: 1250 mg via INTRAVENOUS
  Filled 2015-12-17: qty 1250

## 2015-12-17 MED ORDER — MAGNESIUM SULFATE 50 % IJ SOLN
40.0000 meq | INTRAMUSCULAR | Status: DC
  Filled 2015-12-17: qty 10

## 2015-12-17 MED ORDER — CHLORHEXIDINE GLUCONATE 0.12 % MT SOLN
15.0000 mL | Freq: Once | OROMUCOSAL | Status: AC
  Administered 2015-12-18: 15 mL via OROMUCOSAL
  Filled 2015-12-17: qty 15

## 2015-12-17 MED ORDER — TRANEXAMIC ACID (OHS) BOLUS VIA INFUSION
15.0000 mg/kg | INTRAVENOUS | Status: AC
  Administered 2015-12-18: 1303.5 mg via INTRAVENOUS
  Filled 2015-12-17: qty 1304

## 2015-12-17 MED ORDER — DEXMEDETOMIDINE HCL IN NACL 400 MCG/100ML IV SOLN
0.1000 ug/kg/h | INTRAVENOUS | Status: AC
  Administered 2015-12-18: .2 ug/kg/h via INTRAVENOUS
  Filled 2015-12-17: qty 100

## 2015-12-17 MED ORDER — SODIUM CHLORIDE 0.9 % IV SOLN
INTRAVENOUS | Status: AC
  Administered 2015-12-18: .7 [IU]/h via INTRAVENOUS
  Filled 2015-12-17: qty 2.5

## 2015-12-17 MED ORDER — TEMAZEPAM 15 MG PO CAPS: 15.0000 mg | ORAL_CAPSULE | Freq: Once | ORAL | Status: DC | PRN

## 2015-12-17 MED ORDER — PLASMA-LYTE 148 IV SOLN
INTRAVENOUS | Status: AC
  Administered 2015-12-18: 500 mL
  Filled 2015-12-17: qty 2.5

## 2015-12-17 MED ORDER — EPINEPHRINE PF 1 MG/ML IJ SOLN
0.0000 ug/min | INTRAVENOUS | Status: DC
  Filled 2015-12-17: qty 4

## 2015-12-17 MED FILL — Nitroglycerin IV Soln 100 MCG/ML in D5W: INTRA_ARTERIAL | Qty: 10 | Status: AC

## 2015-12-17 NOTE — Progress Notes (Signed)
CARDIAC REHAB PHASE I   PRE:  Rate/Rhythm: 88 SR  BP:  Sitting: 119/55        SaO2: 100 RA  MODE:  Ambulation: 550 ft   POST:  Rate/Rhythm: 72 SR  BP:  Sitting: 137/70         SaO2: 98 RA  Pt ambulated 550 ft on RA, IV, independent, steady gait, tolerated well with no complaints. Cardiac surgery pre-op education completed with pt and wife at bedside. Reviewed IS, activity progression, cardiac surgery booklet and cardiac surgery guidelines. Pt verbalized understanding, declines cardiac surgery videos. Pt to edge of bed per pt request after walk, call bell within reach. Will follow.  [place], RN, BSN 12/17/2015 3:02 PM

## 2015-12-17 NOTE — Progress Notes (Signed)
Patient Name: [name] Date of Encounter: 12/17/2015  Primary Cardiologist: Dr. Sundra Aland Problem List     Active Problems:   NSTEMI (non-ST elevated myocardial infarction) [facility])     Subjective   No further chest pain.  Cath yesterday revealed severe left main disease.  Inpatient Medications    Scheduled Meds: . aspirin EC  81 mg Oral Daily  . finasteride  5 mg Oral Daily  . rosuvastatin  40 mg Oral QPM  . sodium chloride flush  3 mL Intravenous Q12H  . tamsulosin  0.4 mg Oral Daily   Continuous Infusions: . heparin 1,200 Units/hr (12/17/15 0326)   PRN Meds: sodium chloride, acetaminophen, nitroGLYCERIN, ondansetron (ZOFRAN) IV, sodium chloride flush   Vital Signs    Vitals:   12/16/15 1809 12/16/15 1839 12/16/15 2030 12/17/15 0322  BP: (!) 107/58 (!) 145/45 125/65 (!) 142/63  Pulse:   (!) 59 (!) 53  Resp:   16 16  Temp:   97.7 F (36.5 C) 98.4 F (36.9 C)  TempSrc:   Oral Oral  SpO2: 98% 96% 100% 96%  Weight:    191 lb 8 oz (86.9 kg)  Height:        Intake/Output Summary (Last 24 hours) at 12/17/15 1138 Last data filed at 12/17/15 0326  Gross per 24 hour  Intake              120 ml  Output                0 ml  Net              120 ml   Filed Weights   12/15/15 2001 12/16/15 0551 12/17/15 0322  Weight: 191 lb 6.4 oz (86.8 kg) 191 lb 8 oz (86.9 kg) 191 lb 8 oz (86.9 kg)    Physical Exam    GEN: Well nourished, well developed, in no acute distress.  HEENT: Grossly normal.  Neck: Supple, no JVD, carotid bruits, or masses. Cardiac: RRR, no murmurs, rubs, or gallops. No clubbing, cyanosis, edema.   Respiratory:  Respirations regular and unlabored, clear to auscultation bilaterally. GI: Soft, nontender, nondistended, BS + x 4. MS: no deformity or atrophy. Skin: warm and dry, no rash. Neuro:  Strength and sensation are intact. Psych: AAOx3.  Normal affect.  Labs    CBC  Recent Labs  12/16/15 0727 12/17/15 0251  WBC 6.0 5.2    HGB 13.5 12.9*  HCT 39.3 37.9*  MCV 97.8 97.4  PLT 225 [id number]   Basic Metabolic Panel  Recent Labs  12/16/15 0727 12/17/15 0251  NA 136 137  K 3.9 3.6  CL 103 105  CO2 25 24  GLUCOSE 109* 95  BUN 15 12  CREATININE 0.97 0.84  CALCIUM 9.3 9.0   Liver Function Tests  Recent Labs  12/17/15 0251  AST 21  ALT 15*  ALKPHOS 37*  BILITOT 0.5  PROT 6.0*  ALBUMIN 3.0*   No results for input(s): LIPASE, AMYLASE in the last 72 hours. Cardiac Enzymes  Recent Labs  12/15/15 2007 12/16/15 0218 12/16/15 0727  TROPONINI 0.92* 0.91* 0.65*   BNP Invalid input(s): POCBNP D-Dimer No results for input(s): DDIMER in the last 72 hours. Hemoglobin A1C No results for input(s): HGBA1C in the last 72 hours. Fasting Lipid Panel  Recent Labs  12/16/15 0218  CHOL 149  HDL 61  LDLCALC 73  TRIG 74  CHOLHDL 2.4   Thyroid Function Tests No results for  input(s): TSH, T4TOTAL, T3FREE, THYROIDAB in the last 72 hours.  Invalid input(s): FREET3  Telemetry    NSR - Personally Reviewed  ECG    NSR, nonspecific ST segment changes - Personally Reviewed  Radiology    No results found.  Cardiac Studies     Patient Profile     75 y/o with accelerating angina episode , elevated troponin  Assessment & Plan    1) Plan for CABG tomorrow.  No Eliquis since 11/27.  Getting vascular studies at this time.    Continue losartan and Crestor for HTN and hyperlipidemia.   He is understandably nervous, but overall doing well with the idea of surgery.  Signed, Larae Grooms, MD  12/17/2015, 11:38 AM

## 2015-12-17 NOTE — Progress Notes (Signed)
Pre-op Cardiac Surgery  Carotid Findings:  R:40-59% ICA stenosis.  L: CEA is patent.  Bilateral vertebral artery flow is antegrade.   Upper Extremity Right Left  Brachial Pressures 148T 143T  Radial Waveforms T T  Ulnar Waveforms T M  Palmar Arch (Allen's Test) WNL Doppler signal obliterates with radial compression and remains normal with ulnar compression.    Findings:      Lower  Extremity Right Left  Dorsalis Pedis 27M 32M  Anterior Tibial    Posterior Tibial 65M 106M  Ankle/Brachial Indices 0.62 0.72    Findings:  ABIs indicate moderate reduction in arterial blood flow, bilaterally.

## 2015-12-17 NOTE — Progress Notes (Signed)
    Right Lower Extremity Vein Map    Right Great Saphenous Vein   Segment Diameter Comment  1. Origin 85mm Multiple branches  2. High Thigh 3.70mm   3. Mid Thigh 3.68mm branch  4. Low Thigh 1.63mm   5. At Knee 2.14mm   6. High Calf 1.71mm branch  7. Low Calf 1.70mm branch  8. Ankle 2.12mm Multiple branches   mm    mm    mm        Left Lower Extremity Vein Map    Left Great Saphenous Vein   Segment Diameter Comment  1. Origin 3.20mm   2. High Thigh 3.34mm branch  3. Mid Thigh 4.86mm branch  4. Low Thigh 2.4mm   5. At Knee 2.32mm   6. High Calf 2.106mm   7. Low Calf 1.25mm   8. Ankle 1.31mm    mm    mm    mm

## 2015-12-17 NOTE — Progress Notes (Signed)
ANTICOAGULATION CONSULT NOTE - Follow Up Consult  Pharmacy Consult for Heparin Indication: chest pain/ACS -> CABG 12/1  Allergies  Allergen Reactions  . Wasp Venom Shortness Of Breath and Swelling    Patient Measurements: Height: 6\' 1"  (185.4 cm) Weight: 191 lb 8 oz (86.9 kg) IBW/kg (Calculated) : 79.9  Vital Signs: Temp: 98.4 F (36.9 C) (11/30 0322) Temp Source: Oral (11/30 0322) BP: 142/63 (11/30 0322) Pulse Rate: 53 (11/30 0322)  Labs:  Recent Labs  12/15/15 0832 12/15/15 0930 12/15/15 2007 12/16/15 0218 12/16/15 0727 12/17/15 0251 12/17/15 1141  HGB 13.3  --   --   --  13.5 12.9*  --   HCT 38.5*  --   --   --  39.3 37.9*  --   PLT 214  --   --   --  225 221  --   LABPROT  --   --   --   --  14.8 14.5  --   INR  --   --   --   --  1.15 1.12  --   HEPARINUNFRC  --   --   --   --   --   --  0.30  CREATININE  --  0.99  --   --  0.97 0.84  --   TROPONINI  --   --  0.92* 0.91* 0.65*  --   --     Estimated Creatinine Clearance: 83.2 mL/min (by C-G formula based on SCr of 0.84 mg/dL).  Assessment: 75 year old male on heparin s/p cath with plans for CABG 12/1 Previously on Eliquis for PAF Heparin level therapeutic this AM  Goal of Therapy:  Heparin level 0.3-0.7 units/ml Monitor platelets by anticoagulation protocol: Yes   Plan:  Continue heparin at 1200 units / hr Follow up after CABG 12/1  Thank you Anette Guarneri, PharmD [phone]  12/17/2015,12:19 PM

## 2015-12-17 NOTE — Consult Note (Signed)
      [facility] CM Primary Care Navigator  12/17/2015  AMER EDKIN [birth year] [id number]    Went back to see patient and able to meet him and wife Rosendo Gros) to identify possible discharge needs. Patient and wife reports having left arm pain/ numbness, generalized weakness and blurry vision which had led to this admission.   Patient endorses Dr. Josetta Huddle with [place] [facility] as the primary care provider.   [facility] states [facility]) to obtain medications without difficulty.  He manages his own medications at home using "pill box" system. Wife calls in the needed prescriptions per patient.  Patient is independent with self care and able to drive prior to this admission. Wife will be providing transportation to his doctors' appointments after discharge.  Patient states that wife will be the primary caregiver at home.  Plan for discharge is to go home per patient. Patient and wife voiced understanding to call primary care provider's office when he returns home, for post discharge follow-up appointment within 7- 14 days or earlier if needs arise. Patient letter provided as a reminder.  Patient denies any other needs or concerns at this time.  For additional questions please contact:  Edwena Felty A. Seab Axel, BSN, RN-BC [facility] PRIMARY CARE Navigator Cell: [phone]

## 2015-12-17 NOTE — Care Management Note (Signed)
Case Management Note  Patient Details  Name: [name] MRN: [id number] Date of Birth: September 13, 1940  Subjective/Objective:   Pt presented for NStemi. Post cath revealed severe Left Main Disease. Plan for CABG Friday. Pt is from home with wife. No DME at this time.                  Action/Plan: CM will continue to monitor for additional needs.   Expected Discharge Date:                  Expected Discharge Plan:  [place]  In-House Referral:     Discharge planning Services  CM Consult  Post Acute Care Choice:    Choice offered to:     DME Arranged:    DME Agency:     HH Arranged:    [place] Agency:     Status of Service:  In process, will continue to follow  If discussed at Long Length of Stay Meetings, dates discussed:    Additional Comments:  Bethena Roys, RN 12/17/2015, 3:01 PM

## 2015-12-17 NOTE — Progress Notes (Signed)
[address]       [place] [address]             [phone]                 1 Day Post-Op Procedure(s) (LRB): Left Heart Cath and Coronary Angiography (N/A)  LOS: 2 days   Subjective: No chest pain or left arm pain   Objective: Vital signs in last 24 hours: Patient Vitals for the past 24 hrs:  BP Temp Temp src Pulse Resp SpO2 Weight  12/17/15 1424 (!) 119/55 98.4 F (36.9 C) Oral (!) 58 18 98 % -  12/17/15 0322 (!) 142/63 98.4 F (36.9 C) Oral (!) 53 16 96 % 191 lb 8 oz (86.9 kg)  12/16/15 2030 125/65 97.7 F (36.5 C) Oral (!) 59 16 100 % -  12/16/15 1839 (!) 145/45 - - - - 96 % -  12/16/15 1809 (!) 107/58 - - - - 98 % -    Filed Weights   12/15/15 2001 12/16/15 0551 12/17/15 0322  Weight: 191 lb 6.4 oz (86.8 kg) 191 lb 8 oz (86.9 kg) 191 lb 8 oz (86.9 kg)    Hemodynamic parameters for last 24 hours:    Intake/Output from previous day: 11/29 0701 - 11/30 0700 In: 120 [P.O.:120] Out: -  Intake/Output this shift: No intake/output data recorded.  Scheduled Meds: . aspirin EC  81 mg Oral Daily  . [START ON 12/18/2015] cefUROXime (ZINACEF)  IV  1.5 g Intravenous To OR  . [START ON 12/18/2015] cefUROXime (ZINACEF)  IV  750 mg Intravenous To OR  . [START ON 12/18/2015] dexmedetomidine  0.1-0.7 mcg/kg/hr Intravenous To OR  . [START ON 12/18/2015] DOPamine  0-10 mcg/kg/min Intravenous To OR  . [START ON 12/18/2015] epinephrine  0-10 mcg/min Intravenous To OR  . finasteride  5 mg Oral Daily  . [START ON 12/18/2015] heparin-papaverine-plasmalyte irrigation   Irrigation To OR  . [START ON 12/18/2015] heparin 30,000 units/NS 1000 mL solution for CELLSAVER   Other To OR  . [START ON 12/18/2015] insulin (NOVOLIN-R) infusion   Intravenous To OR  . [START ON 12/18/2015] magnesium sulfate  40 mEq Other To OR  . [START ON 12/18/2015] nitroGLYCERIN  2-200 mcg/min Intravenous To OR  . [START ON 12/18/2015] phenylephrine (NEO-SYNEPHRINE) Adult infusion  30-200  mcg/min Intravenous To OR  . [START ON 12/18/2015] potassium chloride  80 mEq Other To OR  . rosuvastatin  40 mg Oral QPM  . sodium chloride flush  3 mL Intravenous Q12H  . tamsulosin  0.4 mg Oral Daily  . [START ON 12/18/2015] tranexamic acid (CYKLOKAPRON) infusion ([facility])  1.5 mg/kg/hr Intravenous To OR  . [START ON 12/18/2015] tranexamic acid  15 mg/kg Intravenous To OR  . [START ON 12/18/2015] tranexamic acid  2 mg/kg Intracatheter To OR  . [START ON 12/18/2015] vancomycin  1,250 mg Intravenous To OR   Continuous Infusions: . heparin 1,200 Units/hr (12/17/15 0326)   PRN Meds:.sodium chloride, acetaminophen, nitroGLYCERIN, ondansetron (ZOFRAN) IV, sodium chloride flush  General appearance: alert and cooperative Neurologic: intact Heart: regular rate and rhythm, S1, S2 normal, no murmur, click, rub or gallop Lungs: clear to auscultation bilaterally Abdomen: soft, non-tender; bowel sounds normal; no masses,  no organomegaly  Lab Results: CBC: Recent Labs  12/16/15 0727 12/17/15 0251  WBC 6.0 5.2  HGB 13.5 12.9*  HCT 39.3 37.9*  PLT 225 221   BMET:  Recent Labs  12/16/15 0727 12/17/15 0251  NA 136 137  K 3.9 3.6  CL 103 105  CO2 25 24  GLUCOSE 109* 95  BUN 15 12  CREATININE 0.97 0.84  CALCIUM 9.3 9.0    PT/INR:  Recent Labs  12/17/15 0251  LABPROT 14.5  INR 1.12     Radiology No results found.   Assessment/Plan: S/P Procedure(s) (LRB): Left Heart Cath and Coronary Angiography (N/A) Plan CABG in am, possible MAZE and atrial clip The goals risks and alternatives of the planned surgical procedure CABG, possible MAZE and atrial clip  have been discussed with the patient in detail. The risks of the procedure including death, infection, stroke, myocardial infarction, bleeding, blood transfusion have all been discussed specifically.  I have quoted [name] a 3 % of perioperative mortality and a complication rate as high as 40  %. The patient's questions have  been answered.[name] is willing  to proceed with the planned procedure.  Grace Isaac MD 12/17/2015 5:42 PM

## 2015-12-17 NOTE — Consult Note (Signed)
            [facility] CM Primary Care Navigator  12/17/2015  BETO FOUGEROUSSE 06/29/1940 [id number]   Went to see patient in the room to identify possible discharge needs but staff states that patient is in the Vascular lab for a procedure.  Will attempt to see patient when he is available.  For additional questions please contact:  Edwena Felty A. Garron Eline, BSN, RN-BC [facility] PRIMARY CARE Navigator Cell: [phone]

## 2015-12-18 ENCOUNTER — Inpatient Hospital Stay (HOSPITAL_COMMUNITY): Payer: PPO | Admitting: Certified Registered Nurse Anesthetist

## 2015-12-18 ENCOUNTER — Encounter (HOSPITAL_COMMUNITY): Payer: Self-pay | Admitting: Certified Registered"

## 2015-12-18 ENCOUNTER — Inpatient Hospital Stay (HOSPITAL_COMMUNITY): Payer: PPO

## 2015-12-18 ENCOUNTER — Other Ambulatory Visit (HOSPITAL_COMMUNITY): Payer: PPO

## 2015-12-18 ENCOUNTER — Encounter (HOSPITAL_COMMUNITY)
Admission: EM | Disposition: A | Discharge status: Home / Self Care | Payer: Self-pay | Source: Home / Self Care | Attending: Cardiothoracic Surgery

## 2015-12-18 DIAGNOSIS — Z79899 Other long term (current) drug therapy: Secondary | ICD-10-CM | POA: Diagnosis not present

## 2015-12-18 DIAGNOSIS — E785 Hyperlipidemia, unspecified: Secondary | ICD-10-CM | POA: Diagnosis not present

## 2015-12-18 DIAGNOSIS — J9811 Atelectasis: Secondary | ICD-10-CM | POA: Diagnosis not present

## 2015-12-18 DIAGNOSIS — M1990 Unspecified osteoarthritis, unspecified site: Secondary | ICD-10-CM | POA: Diagnosis not present

## 2015-12-18 DIAGNOSIS — I480 Paroxysmal atrial fibrillation: Secondary | ICD-10-CM | POA: Diagnosis not present

## 2015-12-18 DIAGNOSIS — I214 Non-ST elevation (NSTEMI) myocardial infarction: Secondary | ICD-10-CM | POA: Diagnosis not present

## 2015-12-18 DIAGNOSIS — Z4682 Encounter for fitting and adjustment of non-vascular catheter: Secondary | ICD-10-CM | POA: Diagnosis not present

## 2015-12-18 DIAGNOSIS — I2510 Atherosclerotic heart disease of native coronary artery without angina pectoris: Secondary | ICD-10-CM | POA: Diagnosis not present

## 2015-12-18 DIAGNOSIS — I25110 Atherosclerotic heart disease of native coronary artery with unstable angina pectoris: Secondary | ICD-10-CM | POA: Diagnosis not present

## 2015-12-18 DIAGNOSIS — E8770 Fluid overload, unspecified: Secondary | ICD-10-CM | POA: Diagnosis not present

## 2015-12-18 DIAGNOSIS — I739 Peripheral vascular disease, unspecified: Secondary | ICD-10-CM | POA: Diagnosis not present

## 2015-12-18 DIAGNOSIS — I10 Essential (primary) hypertension: Secondary | ICD-10-CM | POA: Diagnosis not present

## 2015-12-18 DIAGNOSIS — Q211 Atrial septal defect: Secondary | ICD-10-CM | POA: Diagnosis not present

## 2015-12-18 DIAGNOSIS — I253 Aneurysm of heart: Secondary | ICD-10-CM | POA: Diagnosis not present

## 2015-12-18 DIAGNOSIS — D62 Acute posthemorrhagic anemia: Secondary | ICD-10-CM | POA: Diagnosis not present

## 2015-12-18 HISTORY — PX: TEE WITHOUT CARDIOVERSION: SHX5443

## 2015-12-18 HISTORY — PX: CORONARY ARTERY BYPASS GRAFT: SHX141

## 2015-12-18 LAB — POCT I-STAT, CHEM 8
BUN: 14 mg/dL (ref 6–20)
BUN: 14 mg/dL (ref 6–20)
BUN: 13 mg/dL (ref 6–20)
BUN: 13 mg/dL (ref 6–20)
BUN: 12 mg/dL (ref 6–20)
BUN: 13 mg/dL (ref 6–20)
CALCIUM ION: 1.26 mmol/L (ref 1.15–1.40)
CALCIUM ION: 1.05 mmol/L — AB (ref 1.15–1.40)
CALCIUM ION: 1.18 mmol/L (ref 1.15–1.40)
CALCIUM ION: 1.14 mmol/L — AB (ref 1.15–1.40)
CALCIUM ION: 1.22 mmol/L (ref 1.15–1.40)
CHLORIDE: 104 mmol/L (ref 101–111)
CHLORIDE: 104 mmol/L (ref 101–111)
CREATININE: 0.5 mg/dL — AB (ref 0.61–1.24)
CREATININE: 0.6 mg/dL — AB (ref 0.61–1.24)
CREATININE: 0.5 mg/dL — AB (ref 0.61–1.24)
CREATININE: 0.7 mg/dL (ref 0.61–1.24)
Calcium, Ion: 1.26 mmol/L (ref 1.15–1.40)
Chloride: 100 mmol/L — ABNORMAL LOW (ref 101–111)
Chloride: 101 mmol/L (ref 101–111)
Chloride: 102 mmol/L (ref 101–111)
Chloride: 104 mmol/L (ref 101–111)
Creatinine, Ser: 0.6 mg/dL — ABNORMAL LOW (ref 0.61–1.24)
Creatinine, Ser: 0.6 mg/dL — ABNORMAL LOW (ref 0.61–1.24)
GLUCOSE: 97 mg/dL (ref 65–99)
GLUCOSE: 110 mg/dL — AB (ref 65–99)
GLUCOSE: 114 mg/dL — AB (ref 65–99)
GLUCOSE: 99 mg/dL (ref 65–99)
Glucose, Bld: 124 mg/dL — ABNORMAL HIGH (ref 65–99)
Glucose, Bld: 136 mg/dL — ABNORMAL HIGH (ref 65–99)
HCT: 29 % — ABNORMAL LOW (ref 39.0–52.0)
HCT: 27 % — ABNORMAL LOW (ref 39.0–52.0)
HCT: 28 % — ABNORMAL LOW (ref 39.0–52.0)
HEMATOCRIT: 32 % — AB (ref 39.0–52.0)
HEMATOCRIT: 26 % — AB (ref 39.0–52.0)
HEMATOCRIT: 24 % — AB (ref 39.0–52.0)
HEMOGLOBIN: 9.2 g/dL — AB (ref 13.0–17.0)
HEMOGLOBIN: 9.5 g/dL — AB (ref 13.0–17.0)
HEMOGLOBIN: 8.8 g/dL — AB (ref 13.0–17.0)
Hemoglobin: 10.9 g/dL — ABNORMAL LOW (ref 13.0–17.0)
Hemoglobin: 9.9 g/dL — ABNORMAL LOW (ref 13.0–17.0)
Hemoglobin: 8.2 g/dL — ABNORMAL LOW (ref 13.0–17.0)
POTASSIUM: 3.6 mmol/L (ref 3.5–5.1)
POTASSIUM: 4.2 mmol/L (ref 3.5–5.1)
Potassium: 4.1 mmol/L (ref 3.5–5.1)
Potassium: 4.4 mmol/L (ref 3.5–5.1)
Potassium: 3.8 mmol/L (ref 3.5–5.1)
Potassium: 4.1 mmol/L (ref 3.5–5.1)
SODIUM: 137 mmol/L (ref 135–145)
SODIUM: 138 mmol/L (ref 135–145)
Sodium: 139 mmol/L (ref 135–145)
Sodium: 135 mmol/L (ref 135–145)
Sodium: 139 mmol/L (ref 135–145)
Sodium: 137 mmol/L (ref 135–145)
TCO2: 26 mmol/L (ref 0–100)
TCO2: 27 mmol/L (ref 0–100)
TCO2: 26 mmol/L (ref 0–100)
TCO2: 27 mmol/L (ref 0–100)
TCO2: 25 mmol/L (ref 0–100)
TCO2: 23 mmol/L (ref 0–100)

## 2015-12-18 LAB — BLOOD GAS, ARTERIAL
Acid-Base Excess: 1.4 mmol/L (ref 0.0–2.0)
Bicarbonate: 25.2 mmol/L (ref 20.0–28.0)
Drawn by: 39866
FIO2: 21
O2 Saturation: 96.4 %
Patient temperature: 98.6
pCO2 arterial: 38 mmHg (ref 32.0–48.0)
pH, Arterial: 7.437 (ref 7.350–7.450)
pO2, Arterial: 83.1 mmHg (ref 83.0–108.0)

## 2015-12-18 LAB — HEMOGLOBIN AND HEMATOCRIT, BLOOD
HCT: 26.1 % — ABNORMAL LOW (ref 39.0–52.0)
Hemoglobin: 9.1 g/dL — ABNORMAL LOW (ref 13.0–17.0)

## 2015-12-18 LAB — POCT I-STAT 3, ART BLOOD GAS (G3+)
ACID-BASE DEFICIT: 2 mmol/L (ref 0.0–2.0)
ACID-BASE DEFICIT: 4 mmol/L — AB (ref 0.0–2.0)
ACID-BASE EXCESS: 4 mmol/L — AB (ref 0.0–2.0)
BICARBONATE: 24.5 mmol/L (ref 20.0–28.0)
BICARBONATE: 28.4 mmol/L — AB (ref 20.0–28.0)
Bicarbonate: 24.3 mmol/L (ref 20.0–28.0)
Bicarbonate: 22.3 mmol/L (ref 20.0–28.0)
Bicarbonate: 21.1 mmol/L (ref 20.0–28.0)
O2 SAT: 99 %
O2 SAT: 98 %
O2 SAT: 98 %
O2 Saturation: 100 %
O2 Saturation: 100 %
PCO2 ART: 33.7 mmHg (ref 32.0–48.0)
PH ART: 7.426 (ref 7.350–7.450)
PH ART: 7.418 (ref 7.350–7.450)
PH ART: 7.4 (ref 7.350–7.450)
PH ART: 7.37 (ref 7.350–7.450)
PO2 ART: 444 mmHg — AB (ref 83.0–108.0)
PO2 ART: 101 mmHg (ref 83.0–108.0)
Patient temperature: 35.2
TCO2: 26 mmol/L (ref 0–100)
TCO2: 30 mmol/L (ref 0–100)
TCO2: 25 mmol/L (ref 0–100)
TCO2: 23 mmol/L (ref 0–100)
TCO2: 22 mmol/L (ref 0–100)
pCO2 arterial: 37.3 mmHg (ref 32.0–48.0)
pCO2 arterial: 44.1 mmHg (ref 32.0–48.0)
pCO2 arterial: 35.9 mmHg (ref 32.0–48.0)
pCO2 arterial: 36.4 mmHg (ref 32.0–48.0)
pH, Arterial: 7.46 — ABNORMAL HIGH (ref 7.350–7.450)
pO2, Arterial: 428 mmHg — ABNORMAL HIGH (ref 83.0–108.0)
pO2, Arterial: 111 mmHg — ABNORMAL HIGH (ref 83.0–108.0)
pO2, Arterial: 117 mmHg — ABNORMAL HIGH (ref 83.0–108.0)

## 2015-12-18 LAB — CBC
HCT: 27.4 % — ABNORMAL LOW (ref 39.0–52.0)
HCT: 26.9 % — ABNORMAL LOW (ref 39.0–52.0)
HEMATOCRIT: 36.3 % — AB (ref 39.0–52.0)
HEMOGLOBIN: 12.3 g/dL — AB (ref 13.0–17.0)
HEMOGLOBIN: 9.4 g/dL — AB (ref 13.0–17.0)
Hemoglobin: 9.3 g/dL — ABNORMAL LOW (ref 13.0–17.0)
MCH: 33.2 pg (ref 26.0–34.0)
MCH: 33.1 pg (ref 26.0–34.0)
MCH: 33.3 pg (ref 26.0–34.0)
MCHC: 33.9 g/dL (ref 30.0–36.0)
MCHC: 34.3 g/dL (ref 30.0–36.0)
MCHC: 34.6 g/dL (ref 30.0–36.0)
MCV: 97.8 fL (ref 78.0–100.0)
MCV: 96.5 fL (ref 78.0–100.0)
MCV: 96.4 fL (ref 78.0–100.0)
PLATELETS: 110 10*3/uL — AB (ref 150–400)
Platelets: 224 10*3/uL (ref 150–400)
Platelets: 132 10*3/uL — ABNORMAL LOW (ref 150–400)
RBC: 3.71 MIL/uL — ABNORMAL LOW (ref 4.22–5.81)
RBC: 2.84 MIL/uL — AB (ref 4.22–5.81)
RBC: 2.79 MIL/uL — ABNORMAL LOW (ref 4.22–5.81)
RDW: 12.2 % (ref 11.5–15.5)
RDW: 12.3 % (ref 11.5–15.5)
RDW: 11.9 % (ref 11.5–15.5)
WBC: 6.1 10*3/uL (ref 4.0–10.5)
WBC: 9.9 10*3/uL (ref 4.0–10.5)
WBC: 7.8 10*3/uL (ref 4.0–10.5)

## 2015-12-18 LAB — BASIC METABOLIC PANEL
ANION GAP: 9 (ref 5–15)
BUN: 17 mg/dL (ref 6–20)
CHLORIDE: 104 mmol/L (ref 101–111)
CO2: 24 mmol/L (ref 22–32)
Calcium: 9.1 mg/dL (ref 8.9–10.3)
Creatinine, Ser: 0.83 mg/dL (ref 0.61–1.24)
GFR calc Af Amer: >60 mL/min (ref >60)
Glucose, Bld: 90 mg/dL (ref 65–99)
POTASSIUM: 3.5 mmol/L (ref 3.5–5.1)
SODIUM: 137 mmol/L (ref 135–145)

## 2015-12-18 LAB — APTT
APTT: 34 s (ref 24–36)
aPTT: 67 seconds — ABNORMAL HIGH (ref 24–36)

## 2015-12-18 LAB — GLUCOSE, CAPILLARY
GLUCOSE-CAPILLARY: 100 mg/dL — AB (ref 65–99)
GLUCOSE-CAPILLARY: 122 mg/dL — AB (ref 65–99)
Glucose-Capillary: 107 mg/dL — ABNORMAL HIGH (ref 65–99)
Glucose-Capillary: 131 mg/dL — ABNORMAL HIGH (ref 65–99)

## 2015-12-18 LAB — POCT I-STAT 4, (NA,K, GLUC, HGB,HCT)
Glucose, Bld: 108 mg/dL — ABNORMAL HIGH (ref 65–99)
HEMATOCRIT: 26 % — AB (ref 39.0–52.0)
Hemoglobin: 8.8 g/dL — ABNORMAL LOW (ref 13.0–17.0)
POTASSIUM: 3.7 mmol/L (ref 3.5–5.1)
SODIUM: 138 mmol/L (ref 135–145)

## 2015-12-18 LAB — MAGNESIUM: Magnesium: 2.5 mg/dL — ABNORMAL HIGH (ref 1.7–2.4)

## 2015-12-18 LAB — CREATININE, SERUM
Creatinine, Ser: 0.88 mg/dL (ref 0.61–1.24)
GFR calc Af Amer: >60 mL/min (ref >60)
GFR calc non Af Amer: >60 mL/min (ref >60)

## 2015-12-18 LAB — ECHO INTRAOPERATIVE TEE
Height: 73 in
Weight: 3064 oz

## 2015-12-18 LAB — HEPARIN LEVEL (UNFRACTIONATED): HEPARIN UNFRACTIONATED: 0.34 [IU]/mL (ref 0.30–0.70)

## 2015-12-18 LAB — HEMOGLOBIN A1C
Hgb A1c MFr Bld: 5.8 % — ABNORMAL HIGH (ref 4.8–5.6)
Mean Plasma Glucose: 120 mg/dL

## 2015-12-18 LAB — PROTIME-INR
INR: 1.59
PROTHROMBIN TIME: 19.1 s — AB (ref 11.4–15.2)

## 2015-12-18 LAB — PLATELET COUNT: Platelets: 151 10*3/uL (ref 150–400)

## 2015-12-18 SURGERY — CORONARY ARTERY BYPASS GRAFTING (CABG)
Anesthesia: General | Site: Chest

## 2015-12-18 MED ORDER — ROCURONIUM BROMIDE 100 MG/10ML IV SOLN
INTRAVENOUS | Status: DC | PRN
  Administered 2015-12-18 (×3): 50 mg via INTRAVENOUS
  Administered 2015-12-18: 100 mg via INTRAVENOUS

## 2015-12-18 MED ORDER — ORAL CARE MOUTH RINSE
15.0000 mL | Freq: Four times a day (QID) | OROMUCOSAL | Status: DC
  Administered 2015-12-18 – 2015-12-22 (×7): 15 mL via OROMUCOSAL

## 2015-12-18 MED ORDER — SODIUM CHLORIDE 0.9 % IV SOLN: 250.0000 mL | INTRAVENOUS | Status: DC

## 2015-12-18 MED ORDER — LACTATED RINGERS IV SOLN: INTRAVENOUS | Status: DC

## 2015-12-18 MED ORDER — HEPARIN SODIUM (PORCINE) 1000 UNIT/ML IJ SOLN
INTRAMUSCULAR | Status: DC | PRN
  Administered 2015-12-18: 30000 [IU] via INTRAVENOUS

## 2015-12-18 MED ORDER — OXYCODONE HCL 5 MG PO TABS
5.0000 mg | ORAL_TABLET | ORAL | Status: DC | PRN
  Administered 2015-12-18: 5 mg via ORAL
  Administered 2015-12-18 – 2015-12-20 (×3): 10 mg via ORAL
  Filled 2015-12-18: qty 2
  Filled 2015-12-18: qty 1
  Filled 2015-12-18 (×2): qty 2

## 2015-12-18 MED ORDER — AMIODARONE HCL IN DEXTROSE 360-4.14 MG/200ML-% IV SOLN
INTRAVENOUS | Status: AC
  Filled 2015-12-18: qty 200

## 2015-12-18 MED ORDER — ACETAMINOPHEN 160 MG/5ML PO SOLN: 1000.0000 mg | Freq: Four times a day (QID) | ORAL | Status: DC

## 2015-12-18 MED ORDER — PROPOFOL 10 MG/ML IV BOLUS
INTRAVENOUS | Status: DC | PRN
  Administered 2015-12-18: 50 mg via INTRAVENOUS
  Administered 2015-12-18: 120 mg via INTRAVENOUS

## 2015-12-18 MED ORDER — SODIUM CHLORIDE 0.9 % IV SOLN
INTRAVENOUS | Status: DC | PRN
  Administered 2015-12-18: 12:00:00 via INTRAVENOUS

## 2015-12-18 MED ORDER — EPHEDRINE 5 MG/ML INJ
INTRAVENOUS | Status: AC
  Filled 2015-12-18: qty 10

## 2015-12-18 MED ORDER — MORPHINE SULFATE (PF) 2 MG/ML IV SOLN
1.0000 mg | INTRAVENOUS | Status: AC | PRN
  Administered 2015-12-18: 2 mg via INTRAVENOUS
  Administered 2015-12-18: 4 mg via INTRAVENOUS
  Filled 2015-12-18: qty 1
  Filled 2015-12-18: qty 2

## 2015-12-18 MED ORDER — SODIUM CHLORIDE 0.9 % IV SOLN
30.0000 meq | Freq: Once | INTRAVENOUS | Status: AC
  Administered 2015-12-18: 30 meq via INTRAVENOUS
  Filled 2015-12-18: qty 15

## 2015-12-18 MED ORDER — LACTATED RINGERS IV SOLN
INTRAVENOUS | Status: DC
  Administered 2015-12-18 (×2): 20 mL/h via INTRAVENOUS
  Administered 2015-12-20: 10 mL/h via INTRAVENOUS

## 2015-12-18 MED ORDER — ACETAMINOPHEN 160 MG/5ML PO SOLN: 650.0000 mg | Freq: Once | ORAL | Status: AC

## 2015-12-18 MED ORDER — CHLORHEXIDINE GLUCONATE 0.12% ORAL RINSE (MEDLINE KIT)
15.0000 mL | Freq: Two times a day (BID) | OROMUCOSAL | Status: DC
  Administered 2015-12-18 – 2015-12-19 (×3): 15 mL via OROMUCOSAL

## 2015-12-18 MED ORDER — PANTOPRAZOLE SODIUM 40 MG PO TBEC
40.0000 mg | DELAYED_RELEASE_TABLET | Freq: Every day | ORAL | Status: DC
  Administered 2015-12-20 – 2015-12-22 (×3): 40 mg via ORAL
  Filled 2015-12-18 (×3): qty 1

## 2015-12-18 MED ORDER — ROCURONIUM BROMIDE 10 MG/ML (PF) SYRINGE
PREFILLED_SYRINGE | INTRAVENOUS | Status: AC
  Filled 2015-12-18: qty 10

## 2015-12-18 MED ORDER — BISACODYL 5 MG PO TBEC
10.0000 mg | DELAYED_RELEASE_TABLET | Freq: Every day | ORAL | Status: DC
  Administered 2015-12-19 – 2015-12-20 (×2): 10 mg via ORAL
  Filled 2015-12-18 (×3): qty 2

## 2015-12-18 MED ORDER — SUCCINYLCHOLINE CHLORIDE 200 MG/10ML IV SOSY
PREFILLED_SYRINGE | INTRAVENOUS | Status: AC
  Filled 2015-12-18: qty 10

## 2015-12-18 MED ORDER — PHENYLEPHRINE HCL 10 MG/ML IJ SOLN
0.0000 ug/min | INTRAVENOUS | Status: DC
  Filled 2015-12-18: qty 2

## 2015-12-18 MED ORDER — FENTANYL CITRATE (PF) 250 MCG/5ML IJ SOLN
INTRAMUSCULAR | Status: AC
  Filled 2015-12-18: qty 5

## 2015-12-18 MED ORDER — DOCUSATE SODIUM 100 MG PO CAPS
200.0000 mg | ORAL_CAPSULE | Freq: Every day | ORAL | Status: DC
  Administered 2015-12-19 – 2015-12-21 (×3): 200 mg via ORAL
  Filled 2015-12-18 (×4): qty 2

## 2015-12-18 MED ORDER — LIDOCAINE 2% (20 MG/ML) 5 ML SYRINGE
INTRAMUSCULAR | Status: AC
  Filled 2015-12-18: qty 5

## 2015-12-18 MED ORDER — ACETAMINOPHEN 500 MG PO TABS
1000.0000 mg | ORAL_TABLET | Freq: Four times a day (QID) | ORAL | Status: DC
  Administered 2015-12-18 – 2015-12-22 (×10): 1000 mg via ORAL
  Filled 2015-12-18 (×10): qty 2

## 2015-12-18 MED ORDER — FENTANYL CITRATE (PF) 250 MCG/5ML IJ SOLN
INTRAMUSCULAR | Status: AC
  Filled 2015-12-18: qty 10

## 2015-12-18 MED ORDER — PHENYLEPHRINE HCL 10 MG/ML IJ SOLN
INTRAMUSCULAR | Status: DC | PRN
  Administered 2015-12-18: 40 ug via INTRAVENOUS

## 2015-12-18 MED ORDER — ONDANSETRON HCL 4 MG/2ML IJ SOLN
4.0000 mg | Freq: Four times a day (QID) | INTRAMUSCULAR | Status: DC | PRN
  Filled 2015-12-18: qty 2

## 2015-12-18 MED ORDER — FENTANYL CITRATE (PF) 100 MCG/2ML IJ SOLN
INTRAMUSCULAR | Status: DC | PRN
  Administered 2015-12-18 (×2): 100 ug via INTRAVENOUS
  Administered 2015-12-18: 150 ug via INTRAVENOUS
  Administered 2015-12-18: 200 ug via INTRAVENOUS
  Administered 2015-12-18: 150 ug via INTRAVENOUS
  Administered 2015-12-18: 100 ug via INTRAVENOUS
  Administered 2015-12-18: 150 ug via INTRAVENOUS
  Administered 2015-12-18: 450 ug via INTRAVENOUS
  Administered 2015-12-18: 150 ug via INTRAVENOUS
  Administered 2015-12-18 (×2): 100 ug via INTRAVENOUS
  Administered 2015-12-18: 250 ug via INTRAVENOUS

## 2015-12-18 MED ORDER — METOPROLOL TARTRATE 5 MG/5ML IV SOLN
2.5000 mg | INTRAVENOUS | Status: DC | PRN
  Filled 2015-12-18: qty 5

## 2015-12-18 MED ORDER — PROTAMINE SULFATE 10 MG/ML IV SOLN
INTRAVENOUS | Status: DC | PRN
  Administered 2015-12-18: 100 mg via INTRAVENOUS
  Administered 2015-12-18 (×2): 50 mg via INTRAVENOUS
  Administered 2015-12-18: 25 mg via INTRAVENOUS
  Administered 2015-12-18: 100 mg via INTRAVENOUS

## 2015-12-18 MED ORDER — SODIUM CHLORIDE 0.9% FLUSH: 3.0000 mL | INTRAVENOUS | Status: DC | PRN

## 2015-12-18 MED ORDER — ALBUMIN HUMAN 5 % IV SOLN
250.0000 mL | INTRAVENOUS | Status: AC | PRN
  Administered 2015-12-18: 250 mL via INTRAVENOUS

## 2015-12-18 MED ORDER — ASPIRIN 81 MG PO CHEW: 324.0000 mg | CHEWABLE_TABLET | Freq: Every day | ORAL | Status: DC

## 2015-12-18 MED ORDER — VANCOMYCIN HCL IN DEXTROSE 1-5 GM/200ML-% IV SOLN
1000.0000 mg | Freq: Once | INTRAVENOUS | Status: AC
  Administered 2015-12-18: 1000 mg via INTRAVENOUS
  Filled 2015-12-18: qty 200

## 2015-12-18 MED ORDER — SODIUM CHLORIDE 0.9 % IJ SOLN
INTRAMUSCULAR | Status: AC
  Filled 2015-12-18: qty 10

## 2015-12-18 MED ORDER — DEXMEDETOMIDINE HCL IN NACL 200 MCG/50ML IV SOLN
0.0000 ug/kg/h | INTRAVENOUS | Status: DC
  Administered 2015-12-18: 0.7 ug/kg/h via INTRAVENOUS
  Filled 2015-12-18: qty 50

## 2015-12-18 MED ORDER — MORPHINE SULFATE (PF) 2 MG/ML IV SOLN
2.0000 mg | INTRAVENOUS | Status: DC | PRN
  Administered 2015-12-18: 4 mg via INTRAVENOUS
  Administered 2015-12-19: 5 mg via INTRAVENOUS
  Administered 2015-12-19: 2 mg via INTRAVENOUS
  Administered 2015-12-19 – 2015-12-20 (×3): 4 mg via INTRAVENOUS
  Filled 2015-12-18 (×2): qty 2
  Filled 2015-12-18: qty 1
  Filled 2015-12-18: qty 3
  Filled 2015-12-18 (×2): qty 2

## 2015-12-18 MED ORDER — PROTAMINE SULFATE 10 MG/ML IV SOLN
INTRAVENOUS | Status: AC
  Filled 2015-12-18: qty 5

## 2015-12-18 MED ORDER — EPHEDRINE SULFATE 50 MG/ML IJ SOLN
INTRAMUSCULAR | Status: DC | PRN
  Administered 2015-12-18 (×2): 5 mg via INTRAVENOUS

## 2015-12-18 MED ORDER — METOCLOPRAMIDE HCL 5 MG/ML IJ SOLN
10.0000 mg | Freq: Four times a day (QID) | INTRAMUSCULAR | Status: AC
  Administered 2015-12-18 – 2015-12-19 (×3): 10 mg via INTRAVENOUS
  Filled 2015-12-18 (×2): qty 2

## 2015-12-18 MED ORDER — LACTATED RINGERS IV SOLN
INTRAVENOUS | Status: DC | PRN
  Administered 2015-12-18 (×4): via INTRAVENOUS

## 2015-12-18 MED ORDER — PROTAMINE SULFATE 10 MG/ML IV SOLN
INTRAVENOUS | Status: AC
  Filled 2015-12-18: qty 25

## 2015-12-18 MED ORDER — SODIUM CHLORIDE 0.9 % IV SOLN: INTRAVENOUS | Status: DC | PRN

## 2015-12-18 MED ORDER — 0.9 % SODIUM CHLORIDE (POUR BTL) OPTIME
TOPICAL | Status: DC | PRN
  Administered 2015-12-18: 6000 mL

## 2015-12-18 MED ORDER — TRAMADOL HCL 50 MG PO TABS: 50.0000 mg | ORAL_TABLET | ORAL | Status: DC | PRN

## 2015-12-18 MED ORDER — SODIUM CHLORIDE 0.45 % IV SOLN
INTRAVENOUS | Status: DC | PRN
  Administered 2015-12-18: 20 mL/h via INTRAVENOUS

## 2015-12-18 MED ORDER — METOPROLOL TARTRATE 25 MG/10 ML ORAL SUSPENSION: 12.5000 mg | Freq: Two times a day (BID) | ORAL | Status: DC

## 2015-12-18 MED ORDER — BISACODYL 10 MG RE SUPP: 10.0000 mg | Freq: Every day | RECTAL | Status: DC

## 2015-12-18 MED ORDER — ACETAMINOPHEN 650 MG RE SUPP
650.0000 mg | Freq: Once | RECTAL | Status: AC
  Administered 2015-12-18: 650 mg via RECTAL

## 2015-12-18 MED ORDER — FAMOTIDINE IN NACL 20-0.9 MG/50ML-% IV SOLN
20.0000 mg | Freq: Two times a day (BID) | INTRAVENOUS | Status: AC
  Administered 2015-12-18 (×2): 20 mg via INTRAVENOUS
  Filled 2015-12-18: qty 50

## 2015-12-18 MED ORDER — DEXTROSE 5 % IV SOLN
1.5000 g | Freq: Two times a day (BID) | INTRAVENOUS | Status: AC
  Administered 2015-12-18 – 2015-12-20 (×4): 1.5 g via INTRAVENOUS
  Filled 2015-12-18 (×4): qty 1.5

## 2015-12-18 MED ORDER — HEMOSTATIC AGENTS (NO CHARGE) OPTIME
TOPICAL | Status: DC | PRN
Start: 2015-12-18 — End: 2015-12-18
  Administered 2015-12-18 (×2): 1 via TOPICAL

## 2015-12-18 MED ORDER — HEPARIN SODIUM (PORCINE) 1000 UNIT/ML IJ SOLN
INTRAMUSCULAR | Status: AC
  Filled 2015-12-18: qty 1

## 2015-12-18 MED ORDER — AMIODARONE HCL IN DEXTROSE 360-4.14 MG/200ML-% IV SOLN
60.0000 mg/h | INTRAVENOUS | Status: AC
  Administered 2015-12-18 – 2015-12-19 (×2): 60 mg/h via INTRAVENOUS

## 2015-12-18 MED ORDER — ASPIRIN EC 325 MG PO TBEC
325.0000 mg | DELAYED_RELEASE_TABLET | Freq: Every day | ORAL | Status: DC
  Filled 2015-12-18: qty 1

## 2015-12-18 MED ORDER — LACTATED RINGERS IV SOLN: 500.0000 mL | Freq: Once | INTRAVENOUS | Status: DC | PRN

## 2015-12-18 MED ORDER — INSULIN ASPART 100 UNIT/ML ~~LOC~~ SOLN
0.0000 [IU] | SUBCUTANEOUS | Status: DC
  Administered 2015-12-18 – 2015-12-19 (×3): 2 [IU] via SUBCUTANEOUS

## 2015-12-18 MED ORDER — MIDAZOLAM HCL 10 MG/2ML IJ SOLN
INTRAMUSCULAR | Status: AC
  Filled 2015-12-18: qty 2

## 2015-12-18 MED ORDER — NITROGLYCERIN IN D5W 200-5 MCG/ML-% IV SOLN: 0.0000 ug/min | INTRAVENOUS | Status: DC

## 2015-12-18 MED ORDER — SODIUM CHLORIDE 0.9 % IJ SOLN
OROMUCOSAL | Status: DC | PRN
  Administered 2015-12-18 (×3): 4 mL via TOPICAL

## 2015-12-18 MED ORDER — AMIODARONE HCL IN DEXTROSE 360-4.14 MG/200ML-% IV SOLN
30.0000 mg/h | INTRAVENOUS | Status: DC
  Administered 2015-12-19 – 2015-12-20 (×2): 30 mg/h via INTRAVENOUS
  Filled 2015-12-18: qty 200

## 2015-12-18 MED ORDER — CHLORHEXIDINE GLUCONATE 0.12 % MT SOLN
15.0000 mL | OROMUCOSAL | Status: AC
  Administered 2015-12-18: 15 mL via OROMUCOSAL

## 2015-12-18 MED ORDER — SODIUM CHLORIDE 0.9% FLUSH
3.0000 mL | Freq: Two times a day (BID) | INTRAVENOUS | Status: DC
  Administered 2015-12-19 – 2015-12-20 (×3): 3 mL via INTRAVENOUS

## 2015-12-18 MED ORDER — MIDAZOLAM HCL 2 MG/2ML IJ SOLN: 2.0000 mg | INTRAMUSCULAR | Status: DC | PRN

## 2015-12-18 MED ORDER — PHENYLEPHRINE 40 MCG/ML (10ML) SYRINGE FOR IV PUSH (FOR BLOOD PRESSURE SUPPORT)
PREFILLED_SYRINGE | INTRAVENOUS | Status: AC
  Filled 2015-12-18: qty 10

## 2015-12-18 MED ORDER — SODIUM CHLORIDE 0.9 % IV SOLN: INTRAVENOUS | Status: DC

## 2015-12-18 MED ORDER — MAGNESIUM SULFATE 4 GM/100ML IV SOLN
4.0000 g | Freq: Once | INTRAVENOUS | Status: AC
  Administered 2015-12-18: 4 g via INTRAVENOUS
  Filled 2015-12-18: qty 100

## 2015-12-18 MED ORDER — METOPROLOL TARTRATE 12.5 MG HALF TABLET
12.5000 mg | ORAL_TABLET | Freq: Two times a day (BID) | ORAL | Status: DC
  Administered 2015-12-19 (×2): 12.5 mg via ORAL
  Filled 2015-12-18 (×4): qty 1

## 2015-12-18 MED ORDER — ALBUMIN HUMAN 5 % IV SOLN
INTRAVENOUS | Status: DC | PRN
  Administered 2015-12-18 (×3): via INTRAVENOUS

## 2015-12-18 MED ORDER — PROPOFOL 10 MG/ML IV BOLUS
INTRAVENOUS | Status: AC
  Filled 2015-12-18: qty 20

## 2015-12-18 MED ORDER — INSULIN REGULAR BOLUS VIA INFUSION
0.0000 [IU] | Freq: Three times a day (TID) | INTRAVENOUS | Status: DC
  Filled 2015-12-18: qty 10

## 2015-12-18 MED ORDER — SODIUM CHLORIDE 0.9 % IV SOLN
INTRAVENOUS | Status: DC
  Filled 2015-12-18: qty 2.5

## 2015-12-18 MED ORDER — FENTANYL CITRATE (PF) 250 MCG/5ML IJ SOLN
INTRAMUSCULAR | Status: AC
  Filled 2015-12-18: qty 20

## 2015-12-18 MED ORDER — MIDAZOLAM HCL 5 MG/5ML IJ SOLN
INTRAMUSCULAR | Status: DC | PRN
  Administered 2015-12-18: 2 mg via INTRAVENOUS
  Administered 2015-12-18: 1 mg via INTRAVENOUS
  Administered 2015-12-18: 2 mg via INTRAVENOUS
  Administered 2015-12-18: 3 mg via INTRAVENOUS
  Administered 2015-12-18: 2 mg via INTRAVENOUS

## 2015-12-18 MED ORDER — LIDOCAINE HCL (CARDIAC) 20 MG/ML IV SOLN
INTRAVENOUS | Status: DC | PRN
  Administered 2015-12-18: 100 mg via INTRAVENOUS

## 2015-12-18 MED FILL — Electrolyte-R (PH 7.4) Solution: INTRAVENOUS | Qty: 4000 | Status: AC

## 2015-12-18 MED FILL — Lidocaine HCl IV Inj 20 MG/ML: INTRAVENOUS | Qty: 5 | Status: AC

## 2015-12-18 MED FILL — Heparin Sodium (Porcine) Inj 1000 Unit/ML: INTRAMUSCULAR | Qty: 10 | Status: AC

## 2015-12-18 MED FILL — Mannitol IV Soln 20%: INTRAVENOUS | Qty: 500 | Status: AC

## 2015-12-18 MED FILL — Sodium Bicarbonate IV Soln 8.4%: INTRAVENOUS | Qty: 50 | Status: AC

## 2015-12-18 MED FILL — Sodium Chloride IV Soln 0.9%: INTRAVENOUS | Qty: 2000 | Status: AC

## 2015-12-18 SURGICAL SUPPLY — 85 items
ADH SKN CLS APL DERMABOND .7 (GAUZE/BANDAGES/DRESSINGS) ×3
APPLICATOR COTTON TIP 6IN STRL (MISCELLANEOUS) ×4 IMPLANT
BAG DECANTER FOR FLEXI CONT (MISCELLANEOUS) ×4 IMPLANT
BANDAGE ACE 4X5 VEL STRL LF (GAUZE/BANDAGES/DRESSINGS) ×6 IMPLANT
BANDAGE ACE 6X5 VEL STRL LF (GAUZE/BANDAGES/DRESSINGS) ×8 IMPLANT
BLADE STERNUM SYSTEM 6 (BLADE) ×4 IMPLANT
BLADE SURG 11 STRL SS (BLADE) ×2 IMPLANT
BLADE SURG ROTATE 9660 (MISCELLANEOUS) ×8 IMPLANT
BNDG GAUZE ELAST 4 BULKY (GAUZE/BANDAGES/DRESSINGS) ×6 IMPLANT
CANISTER SUCTION 2500CC (MISCELLANEOUS) ×4 IMPLANT
CANNULA MC2 2 STG 32/40 NON-V (CANNULA) ×1 IMPLANT
CANNULA MC2 2 STG 36/46 NON-V (CANNULA) ×1 IMPLANT
CANNULA VENOUS 2 STG 32/40 (CANNULA) ×1
CANNULA VENOUS 2 STG 34/46 (CANNULA) ×1
CARDIOBLATE CARDIAC ABLATION (MISCELLANEOUS)
CATH CPB KIT GERHARDT (MISCELLANEOUS) ×4 IMPLANT
CATH THORACIC 28FR (CATHETERS) ×4 IMPLANT
COVER MAYO STAND STRL (DRAPES) ×2 IMPLANT
COVER SURGICAL LIGHT HANDLE (MISCELLANEOUS) ×2 IMPLANT
CRADLE DONUT ADULT HEAD (MISCELLANEOUS) ×4 IMPLANT
DERMABOND ADVANCED (GAUZE/BANDAGES/DRESSINGS) ×1
DERMABOND ADVANCED .7 DNX12 (GAUZE/BANDAGES/DRESSINGS) ×1 IMPLANT
DEVICE CARDIOBLATE CARDIAC ABL (MISCELLANEOUS) IMPLANT
DRAIN CHANNEL 28F RND 3/8 FF (WOUND CARE) ×4 IMPLANT
DRAPE CARDIOVASCULAR INCISE (DRAPES) ×4
DRAPE INCISE IOBAN 66X45 STRL (DRAPES) ×2 IMPLANT
DRAPE SLUSH/WARMER DISC (DRAPES) ×4 IMPLANT
DRAPE SRG 135X102X78XABS (DRAPES) ×3 IMPLANT
DRSG AQUACEL AG ADV 3.5X14 (GAUZE/BANDAGES/DRESSINGS) ×4 IMPLANT
ELECT BLADE 4.0 EZ CLEAN MEGAD (MISCELLANEOUS) ×4
ELECT REM PT RETURN 9FT ADLT (ELECTROSURGICAL) ×8
ELECTRODE BLDE 4.0 EZ CLN MEGD (MISCELLANEOUS) ×3 IMPLANT
ELECTRODE REM PT RTRN 9FT ADLT (ELECTROSURGICAL) ×6 IMPLANT
FELT TEFLON 1X6 (MISCELLANEOUS) ×8 IMPLANT
GAUZE SPONGE 4X4 12PLY STRL (GAUZE/BANDAGES/DRESSINGS) ×8 IMPLANT
GLOVE BIO SURGEON STRL SZ 6 (GLOVE) ×6 IMPLANT
GLOVE BIO SURGEON STRL SZ 6.5 (GLOVE) ×14 IMPLANT
GLOVE BIO SURGEON STRL SZ7 (GLOVE) ×8 IMPLANT
GOWN STRL REUS W/ TWL LRG LVL3 (GOWN DISPOSABLE) ×12 IMPLANT
GOWN STRL REUS W/TWL LRG LVL3 (GOWN DISPOSABLE) ×16
HEMOSTAT POWDER SURGIFOAM 1G (HEMOSTASIS) ×12 IMPLANT
HEMOSTAT SURGICEL 2X14 (HEMOSTASIS) ×4 IMPLANT
KIT BASIN OR (CUSTOM PROCEDURE TRAY) ×4 IMPLANT
KIT CATH SUCT 8FR (CATHETERS) ×4 IMPLANT
KIT ROOM TURNOVER OR (KITS) ×4 IMPLANT
KIT SUCTION CATH 14FR (SUCTIONS) ×8 IMPLANT
KIT VASOVIEW HEMOPRO VH 3000 (KITS) ×4 IMPLANT
LEAD PACING MYOCARDI (MISCELLANEOUS) ×4 IMPLANT
MARKER GRAFT CORONARY BYPASS (MISCELLANEOUS) ×12 IMPLANT
NS IRRIG 1000ML POUR BTL (IV SOLUTION) ×22 IMPLANT
PACK OPEN HEART (CUSTOM PROCEDURE TRAY) ×4 IMPLANT
PAD ARMBOARD 7.5X6 YLW CONV (MISCELLANEOUS) ×8 IMPLANT
PAD ELECT DEFIB RADIOL ZOLL (MISCELLANEOUS) ×4 IMPLANT
PENCIL BUTTON HOLSTER BLD 10FT (ELECTRODE) ×4 IMPLANT
PROBE CRYO2-ABLATION MALLABLE (MISCELLANEOUS) IMPLANT
PUNCH AORTIC ROTATE  4.5MM 8IN (MISCELLANEOUS) ×2 IMPLANT
SET CARDIOPLEGIA MPS 5001102 (MISCELLANEOUS) ×2 IMPLANT
SOLUTION ANTI FOG 6CC (MISCELLANEOUS) ×2 IMPLANT
SPONGE GAUZE 4X4 12PLY STER LF (GAUZE/BANDAGES/DRESSINGS) ×4 IMPLANT
SPONGE LAP 18X18 X RAY DECT (DISPOSABLE) ×14 IMPLANT
SUT BONE WAX W31G (SUTURE) ×4 IMPLANT
SUT MNCRL AB 4-0 PS2 18 (SUTURE) ×4 IMPLANT
SUT PROLENE 3 0 SH1 36 (SUTURE) ×6 IMPLANT
SUT PROLENE 4 0 TF (SUTURE) ×8 IMPLANT
SUT PROLENE 5 0 C 1 36 (SUTURE) ×2 IMPLANT
SUT PROLENE 6 0 CC (SUTURE) ×8 IMPLANT
SUT PROLENE 7 0 BV1 MDA (SUTURE) ×4 IMPLANT
SUT PROLENE 8 0 BV175 6 (SUTURE) ×2 IMPLANT
SUT SILK 2 0 SH CR/8 (SUTURE) ×2 IMPLANT
SUT STEEL 6MS V (SUTURE) ×4 IMPLANT
SUT STEEL SZ 6 DBL 3X14 BALL (SUTURE) ×4 IMPLANT
SUT VIC AB 1 CTX 18 (SUTURE) ×8 IMPLANT
SUT VIC AB 2-0 CT1 27 (SUTURE) ×8
SUT VIC AB 2-0 CT1 TAPERPNT 27 (SUTURE) ×2 IMPLANT
SUTURE E-PAK OPEN HEART (SUTURE) ×4 IMPLANT
SYSTEM SAHARA CHEST DRAIN ATS (WOUND CARE) ×4 IMPLANT
TAPE CLOTH SURG 4X10 WHT LF (GAUZE/BANDAGES/DRESSINGS) ×2 IMPLANT
TAPE PAPER 2X10 WHT MICROPORE (GAUZE/BANDAGES/DRESSINGS) ×2 IMPLANT
TAPE STRIPS DRAPE STRL (GAUZE/BANDAGES/DRESSINGS) ×2 IMPLANT
TOWEL OR 17X24 6PK STRL BLUE (TOWEL DISPOSABLE) ×8 IMPLANT
TOWEL OR 17X26 10 PK STRL BLUE (TOWEL DISPOSABLE) ×8 IMPLANT
TRAY FOLEY IC TEMP SENS 16FR (CATHETERS) ×4 IMPLANT
TUBING INSUFFLATION (TUBING) ×4 IMPLANT
UNDERPAD 30X30 (UNDERPADS AND DIAPERS) ×4 IMPLANT
WATER STERILE IRR 1000ML POUR (IV SOLUTION) ×8 IMPLANT

## 2015-12-18 NOTE — Anesthesia Procedure Notes (Signed)
Central Venous Catheter Insertion Performed by: anesthesiologist Patient location: Pre-op. Preanesthetic checklist: patient identified, IV checked, site marked, risks and benefits discussed, surgical consent, monitors and equipment checked, pre-op evaluation, timeout performed and anesthesia consent Position: Trendelenburg Lidocaine 1% used for infiltration Landmarks identified and Seldinger technique used Catheter size: 9 Fr Central line and PA cath was placed.MAC introducer Swan type and PA catheter depth:thermodilution and 50PA Cath depth:50 Procedure performed using ultrasound guided technique. Attempts: 1 Following insertion, line sutured and dressing applied. Post procedure assessment: blood return through all ports, free fluid flow and no air. Patient tolerated the procedure well with no immediate complications.

## 2015-12-18 NOTE — Anesthesia Postprocedure Evaluation (Signed)
Anesthesia Post Note  Patient: TORREAN LAMBRECHT  Procedure(s) Performed: Procedure(s) (LRB): CORONARY ARTERY BYPASS GRAFTING (CABG) x 3, using left internal mammary artery and bilateral   leg greater saphenous vein harvested endoscopically  LIMA to LAD, SVG to Circumflex, SVG to RCA (N/A) TRANSESOPHAGEAL ECHOCARDIOGRAM (TEE) (N/A)  Patient location during evaluation: SICU Anesthesia Type: General Level of consciousness: patient remains intubated per anesthesia plan Pain management: pain level controlled Vital Signs Assessment: post-procedure vital signs reviewed and stable Respiratory status: patient remains intubated per anesthesia plan Cardiovascular status: stable Anesthetic complications: no    Last Vitals:  Vitals:   12/18/15 1545 12/18/15 1600  BP:  134/87  Pulse: 78 80  Resp: 12 16  Temp: 36.3 C 36.6 C    Last Pain:  Vitals:   12/18/15 1400  TempSrc: Core (Comment)  PainSc:                  EDWARDS,Tyquavious Gamel

## 2015-12-18 NOTE — Procedures (Signed)
Extubation Procedure Note  Patient Details:   Name: BUCKY LENKIEWICZ DOB: Aug 23, 1940 MRN: [id number]   Airway Documentation:     Evaluation  O2 sats: stable throughout Complications: No apparent complications Patient did tolerate procedure well.     Yes  4l/min [place] NIF-20 FVC-1.0L Incentive spirometer instructed  Revonda Standard 12/18/2015, 5:46 PM

## 2015-12-18 NOTE — Progress Notes (Signed)
[address]       [place] [address]             [phone]                 Day of Surgery Procedure(s) (LRB): CORONARY ARTERY BYPASS GRAFTING (CABG) (N/A) TRANSESOPHAGEAL ECHOCARDIOGRAM (TEE) (N/A) CLIPPING OF ATRIAL APPENDAGE (Left) MAZE (N/A)  LOS: 3 days   Subjective: In holding area for surgery this am  Objective: Vital signs in last 24 hours: Patient Vitals for the past 24 hrs:  BP Temp Temp src Pulse Resp SpO2  12/18/15 0537 (!) 150/77 97.3 F (36.3 C) Oral 66 - 100 %  12/17/15 2057 (!) 153/65 97.7 F (36.5 C) Oral (!) 54 - 98 %  12/17/15 1424 (!) 119/55 98.4 F (36.9 C) Oral (!) 58 18 98 %    Filed Weights   12/15/15 2001 12/16/15 0551 12/17/15 0322  Weight: 191 lb 6.4 oz (86.8 kg) 191 lb 8 oz (86.9 kg) 191 lb 8 oz (86.9 kg)    Hemodynamic parameters for last 24 hours:    Intake/Output from previous day: No intake/output data recorded. Intake/Output this shift: No intake/output data recorded.  Scheduled Meds: . [MAR Hold] aspirin EC  81 mg Oral Daily  . cefUROXime (ZINACEF)  IV  1.5 g Intravenous To OR  . cefUROXime (ZINACEF)  IV  750 mg Intravenous To OR  . dexmedetomidine  0.1-0.7 mcg/kg/hr Intravenous To OR  . DOPamine  0-10 mcg/kg/min Intravenous To OR  . epinephrine  0-10 mcg/min Intravenous To OR  . [MAR Hold] finasteride  5 mg Oral Daily  . heparin-papaverine-plasmalyte irrigation   Irrigation To OR  . heparin 30,000 units/NS 1000 mL solution for CELLSAVER   Other To OR  . insulin (NOVOLIN-R) infusion   Intravenous To OR  . magnesium sulfate  40 mEq Other To OR  . nitroGLYCERIN  2-200 mcg/min Intravenous To OR  . phenylephrine (NEO-SYNEPHRINE) Adult infusion  30-200 mcg/min Intravenous To OR  . potassium chloride  80 mEq Other To OR  . [MAR Hold] rosuvastatin  40 mg Oral QPM  . [MAR Hold] sodium chloride flush  3 mL Intravenous Q12H  . [MAR Hold] tamsulosin  0.4 mg Oral Daily  . tranexamic acid (CYKLOKAPRON) infusion  ([facility])  1.5 mg/kg/hr Intravenous To OR  . tranexamic acid  15 mg/kg Intravenous To OR  . tranexamic acid  2 mg/kg Intracatheter To OR  . vancomycin  1,250 mg Intravenous To OR   Continuous Infusions: . heparin 1,200 Units/hr (12/18/15 0100)   PRN Meds:.[MAR Hold] sodium chloride, [MAR Hold] acetaminophen, [MAR Hold] nitroGLYCERIN, [MAR Hold] ondansetron (ZOFRAN) IV, [MAR Hold] sodium chloride flush, temazepam  General appearance: alert and cooperative Neurologic: intact Heart: regular rate and rhythm, S1, S2 normal, no murmur, click, rub or gallop Lungs: clear to auscultation bilaterally Abdomen: soft, non-tender; bowel sounds normal; no masses,  no organomegaly Extremities: extremities normal, atraumatic, no cyanosis or edema  Lab Results: CBC: Recent Labs  12/17/15 0251 12/18/15 0324  WBC 5.2 6.1  HGB 12.9* 12.3*  HCT 37.9* 36.3*  PLT 221 224   BMET:  Recent Labs  12/17/15 0251 12/18/15 0324  NA 137 137  K 3.6 3.5  CL 105 104  CO2 24 24  GLUCOSE 95 90  BUN 12 17  CREATININE 0.84 0.83  CALCIUM 9.0 9.1    PT/INR:  Recent Labs  12/17/15 0251  LABPROT 14.5  INR 1.12  Radiology No results found.   Assessment/Plan: S/P Procedure(s) (LRB): CORONARY ARTERY BYPASS GRAFTING (CABG) (N/A) TRANSESOPHAGEAL ECHOCARDIOGRAM (TEE) (N/A) CLIPPING OF ATRIAL APPENDAGE (Left) MAZE (N/A)   Will plan to proceed with outlined procedure, no changes    Grace Isaac MD 12/18/2015 7:08 AM

## 2015-12-18 NOTE — OR Nursing (Signed)
First SICU Call at 1232 Marcene Brawn);        Marykay Lex Kross Swallows,RN

## 2015-12-18 NOTE — Anesthesia Procedure Notes (Signed)
Procedure Name: Intubation Date/Time: 12/18/2015 7:54 AM Performed by: Myna Bright Pre-anesthesia Checklist: Patient identified, Emergency Drugs available, Suction available and Patient being monitored Patient Re-evaluated:Patient Re-evaluated prior to inductionOxygen Delivery Method: Circle system utilized Preoxygenation: Pre-oxygenation with 100% oxygen Intubation Type: IV induction Ventilation: Mask ventilation without difficulty and Oral airway inserted - appropriate to patient size Laryngoscope Size: Mac and 4 Grade View: Grade III Tube type: Oral Tube size: 8.0 mm Number of attempts: 1 Airway Equipment and Method: Stylet Placement Confirmation: ETT inserted through vocal cords under direct vision,  positive ETCO2 and breath sounds checked- equal and bilateral Secured at: 23 cm Tube secured with: Tape Dental Injury: Teeth and Oropharynx as per pre-operative assessment

## 2015-12-18 NOTE — Progress Notes (Signed)
      [address]       [place] [address]             [phone]      S/p CABg x 3  Intubated and sedated  BP 134/87   Pulse 80   Temp 97.9 F (36.6 C)   Resp 16   Ht 6\' 1"  (1.854 m)   Wt 191 lb 8 oz (86.9 kg)   SpO2 100%   BMI 25.27 kg/m  CI= 2.1. Low dose neo for BP  Intake/Output Summary (Last 24 hours) at 12/18/15 1649 Last data filed at 12/18/15 1600  Gross per 24 hour  Intake          5562.73 ml  Output             2190 ml  Net          3372.73 ml   Hct=26  Doing well early postop Ready to wean vent  Remo Lipps C. Roxan Hockey, MD [facility] [phone]

## 2015-12-18 NOTE — Transfer of Care (Signed)
Immediate Anesthesia Transfer of Care Note  Patient: [name]  Procedure(s) Performed: Procedure(s): CORONARY ARTERY BYPASS GRAFTING (CABG) x 3, using left internal mammary artery and bilateral   leg greater saphenous vein harvested endoscopically  LIMA to LAD, SVG to Circumflex, SVG to RCA (N/A) TRANSESOPHAGEAL ECHOCARDIOGRAM (TEE) (N/A)  Patient Location: SICU  Anesthesia Type:General  Level of Consciousness: sedated, patient cooperative and Patient remains intubated per anesthesia plan  Airway & Oxygen Therapy: Patient remains intubated per anesthesia plan and Patient placed on Ventilator (see vital sign flow sheet for setting)  Post-op Assessment: Report given to RN and Post -op Vital signs reviewed and stable  Post vital signs: Reviewed and stable  Last Vitals:  Vitals:   12/17/15 2057 12/18/15 0537  BP: (!) 153/65 (!) 150/77  Pulse: (!) 54 66  Resp:    Temp: 36.5 C 36.3 C    Last Pain:  Vitals:   12/18/15 0537  TempSrc: Oral  PainSc:       Patients Stated Pain Goal: 0 ([id number])  Complications: No apparent anesthesia complications

## 2015-12-18 NOTE — Brief Op Note (Signed)
13:10 - 20 minute call to SICU charge nurse

## 2015-12-18 NOTE — Brief Op Note (Addendum)
      [address]       [place] [address]             [phone]      12/18/2015  11:33 AM  PATIENT:  [name]  75 y.o. male  PRE-OPERATIVE DIAGNOSIS:  CAD LMD  POST-OPERATIVE DIAGNOSIS:  CAD LMD  PROCEDURE:  Procedure(s):  CORONARY ARTERY BYPASS GRAFTING (CABG) x 3 -LIMA to LAD -SVG to LEFT CIRCUMFLEX -SVG to RCA  TRANSESOPHAGEAL ECHOCARDIOGRAM (TEE) (N/A) -Attempted, but unable to pass TEE probe  SURGEON:  Surgeon(s) and Role:    * Grace Isaac, MD - Primary  PHYSICIAN ASSISTANT: Ellwood Handler PA-C  ANESTHESIA:   general  EBL:  Total I/O In: 1000 [I.V.:1000] Out: 200 [Urine:200]  BLOOD ADMINISTERED: CELLSAVER  DRAINS: Left Pleural Chest Tubes, Mediastinal Chest tubes   LOCAL MEDICATIONS USED:  NONE  SPECIMEN:  No Specimen  DISPOSITION OF SPECIMEN:  N/A  COUNTS:  YES  Findings: TEE probe  not able to be placed by anesthetist  DICTATION: .Dragon Dictation  PLAN OF CARE: Admit to inpatient   PATIENT DISPOSITION:  ICU - intubated and hemodynamically stable.   Delay start of Pharmacological VTE agent (>24hrs) due to surgical blood loss or risk of bleeding: yes

## 2015-12-18 NOTE — Anesthesia Preprocedure Evaluation (Signed)
Anesthesia Evaluation  Patient identified by MRN, date of birth, ID band Patient awake    Reviewed: Allergy & Precautions, NPO status , Patient's Chart, lab work & pertinent test results  History of Anesthesia Complications (+) MALIGNANT HYPERTHERMIA  Airway Mallampati: II       Dental   Pulmonary former smoker,    breath sounds clear to auscultation       Cardiovascular hypertension, + CAD, + Past MI and + Peripheral Vascular Disease   Rhythm:Regular Rate:Normal     Neuro/Psych  Headaches,    GI/Hepatic negative GI ROS, Neg liver ROS,   Endo/Other    Renal/GU Renal disease     Musculoskeletal  (+) Arthritis ,   Abdominal   Peds  Hematology   Anesthesia Other Findings   Reproductive/Obstetrics                             Anesthesia Physical Anesthesia Plan  ASA: III  Anesthesia Plan: General   Post-op Pain Management:    Induction: Intravenous  Airway Management Planned: Oral ETT  Additional Equipment: Arterial line and PA Cath  Intra-op Plan:   Post-operative Plan: Post-operative intubation/ventilation  Informed Consent: I have reviewed the patients History and Physical, chart, labs and discussed the procedure including the risks, benefits and alternatives for the proposed anesthesia with the patient or authorized representative who has indicated his/her understanding and acceptance.   Dental advisory given  Plan Discussed with: CRNA and Anesthesiologist  Anesthesia Plan Comments:         Anesthesia Quick Evaluation

## 2015-12-19 ENCOUNTER — Inpatient Hospital Stay (HOSPITAL_COMMUNITY): Payer: PPO

## 2015-12-19 DIAGNOSIS — E785 Hyperlipidemia, unspecified: Secondary | ICD-10-CM | POA: Diagnosis not present

## 2015-12-19 DIAGNOSIS — I480 Paroxysmal atrial fibrillation: Secondary | ICD-10-CM | POA: Diagnosis not present

## 2015-12-19 DIAGNOSIS — Q211 Atrial septal defect: Secondary | ICD-10-CM | POA: Diagnosis not present

## 2015-12-19 DIAGNOSIS — I10 Essential (primary) hypertension: Secondary | ICD-10-CM | POA: Diagnosis not present

## 2015-12-19 DIAGNOSIS — I739 Peripheral vascular disease, unspecified: Secondary | ICD-10-CM | POA: Diagnosis not present

## 2015-12-19 DIAGNOSIS — E8770 Fluid overload, unspecified: Secondary | ICD-10-CM | POA: Diagnosis not present

## 2015-12-19 DIAGNOSIS — D62 Acute posthemorrhagic anemia: Secondary | ICD-10-CM | POA: Diagnosis not present

## 2015-12-19 DIAGNOSIS — I253 Aneurysm of heart: Secondary | ICD-10-CM | POA: Diagnosis not present

## 2015-12-19 DIAGNOSIS — I214 Non-ST elevation (NSTEMI) myocardial infarction: Secondary | ICD-10-CM | POA: Diagnosis not present

## 2015-12-19 DIAGNOSIS — I2510 Atherosclerotic heart disease of native coronary artery without angina pectoris: Secondary | ICD-10-CM | POA: Diagnosis not present

## 2015-12-19 DIAGNOSIS — J90 Pleural effusion, not elsewhere classified: Secondary | ICD-10-CM | POA: Diagnosis not present

## 2015-12-19 DIAGNOSIS — J9811 Atelectasis: Secondary | ICD-10-CM | POA: Diagnosis not present

## 2015-12-19 DIAGNOSIS — Z79899 Other long term (current) drug therapy: Secondary | ICD-10-CM | POA: Diagnosis not present

## 2015-12-19 LAB — CBC
HCT: 27.5 % — ABNORMAL LOW (ref 39.0–52.0)
HEMATOCRIT: 28 % — AB (ref 39.0–52.0)
HEMOGLOBIN: 9.4 g/dL — AB (ref 13.0–17.0)
Hemoglobin: 9.5 g/dL — ABNORMAL LOW (ref 13.0–17.0)
MCH: 32.8 pg (ref 26.0–34.0)
MCH: 33 pg (ref 26.0–34.0)
MCHC: 34.2 g/dL (ref 30.0–36.0)
MCHC: 33.9 g/dL (ref 30.0–36.0)
MCV: 95.8 fL (ref 78.0–100.0)
MCV: 97.2 fL (ref 78.0–100.0)
Platelets: 170 10*3/uL (ref 150–400)
Platelets: 164 10*3/uL (ref 150–400)
RBC: 2.87 MIL/uL — ABNORMAL LOW (ref 4.22–5.81)
RBC: 2.88 MIL/uL — ABNORMAL LOW (ref 4.22–5.81)
RDW: 12.1 % (ref 11.5–15.5)
RDW: 12.3 % (ref 11.5–15.5)
WBC: 8.9 10*3/uL (ref 4.0–10.5)
WBC: 9.7 10*3/uL (ref 4.0–10.5)

## 2015-12-19 LAB — BASIC METABOLIC PANEL
ANION GAP: 5 (ref 5–15)
BUN: 13 mg/dL (ref 6–20)
CALCIUM: 8 mg/dL — AB (ref 8.9–10.3)
CHLORIDE: 105 mmol/L (ref 101–111)
CO2: 24 mmol/L (ref 22–32)
CREATININE: 0.95 mg/dL (ref 0.61–1.24)
GFR calc non Af Amer: >60 mL/min (ref >60)
Glucose, Bld: 135 mg/dL — ABNORMAL HIGH (ref 65–99)
Potassium: 3.9 mmol/L (ref 3.5–5.1)
SODIUM: 134 mmol/L — AB (ref 135–145)

## 2015-12-19 LAB — POCT I-STAT, CHEM 8
BUN: 14 mg/dL (ref 6–20)
CREATININE: 0.9 mg/dL (ref 0.61–1.24)
Calcium, Ion: 1.19 mmol/L (ref 1.15–1.40)
Chloride: 100 mmol/L — ABNORMAL LOW (ref 101–111)
Glucose, Bld: 147 mg/dL — ABNORMAL HIGH (ref 65–99)
HEMATOCRIT: 30 % — AB (ref 39.0–52.0)
HEMOGLOBIN: 10.2 g/dL — AB (ref 13.0–17.0)
Potassium: 4.1 mmol/L (ref 3.5–5.1)
SODIUM: 136 mmol/L (ref 135–145)
TCO2: 23 mmol/L (ref 0–100)

## 2015-12-19 LAB — GLUCOSE, CAPILLARY
GLUCOSE-CAPILLARY: 122 mg/dL — AB (ref 65–99)
GLUCOSE-CAPILLARY: 112 mg/dL — AB (ref 65–99)
Glucose-Capillary: 118 mg/dL — ABNORMAL HIGH (ref 65–99)
Glucose-Capillary: 105 mg/dL — ABNORMAL HIGH (ref 65–99)
Glucose-Capillary: 138 mg/dL — ABNORMAL HIGH (ref 65–99)
Glucose-Capillary: 118 mg/dL — ABNORMAL HIGH (ref 65–99)

## 2015-12-19 LAB — CREATININE, SERUM
Creatinine, Ser: 1 mg/dL (ref 0.61–1.24)
GFR calc non Af Amer: >60 mL/min (ref >60)

## 2015-12-19 LAB — MAGNESIUM
MAGNESIUM: 2.2 mg/dL (ref 1.7–2.4)
Magnesium: 2.2 mg/dL (ref 1.7–2.4)

## 2015-12-19 MED ORDER — INSULIN ASPART 100 UNIT/ML ~~LOC~~ SOLN
0.0000 [IU] | SUBCUTANEOUS | Status: DC
Start: 2015-12-19 — End: 2015-12-20
  Administered 2015-12-19: 2 [IU] via SUBCUTANEOUS

## 2015-12-19 MED ORDER — AMIODARONE HCL IN DEXTROSE 360-4.14 MG/200ML-% IV SOLN
INTRAVENOUS | Status: AC
  Filled 2015-12-19: qty 200

## 2015-12-19 MED ORDER — POTASSIUM CHLORIDE 2 MEQ/ML IV SOLN
30.0000 meq | Freq: Once | INTRAVENOUS | Status: AC
  Administered 2015-12-19: 30 meq via INTRAVENOUS
  Filled 2015-12-19: qty 15

## 2015-12-19 MED ORDER — FUROSEMIDE 10 MG/ML IJ SOLN
INTRAMUSCULAR | Status: AC
  Filled 2015-12-19: qty 2

## 2015-12-19 MED ORDER — ENOXAPARIN SODIUM 40 MG/0.4ML ~~LOC~~ SOLN
40.0000 mg | Freq: Every day | SUBCUTANEOUS | Status: DC
  Administered 2015-12-19 – 2015-12-20 (×2): 40 mg via SUBCUTANEOUS
  Filled 2015-12-19 (×2): qty 0.4

## 2015-12-19 MED ORDER — AMIODARONE HCL IN DEXTROSE 360-4.14 MG/200ML-% IV SOLN
INTRAVENOUS | Status: AC
  Administered 2015-12-20: 30 mg/h via INTRAVENOUS
  Filled 2015-12-19: qty 200

## 2015-12-19 MED ORDER — ASPIRIN EC 81 MG PO TBEC
81.0000 mg | DELAYED_RELEASE_TABLET | Freq: Every day | ORAL | Status: DC
  Administered 2015-12-19 – 2015-12-22 (×4): 81 mg via ORAL
  Filled 2015-12-19 (×3): qty 1

## 2015-12-19 MED ORDER — FUROSEMIDE 10 MG/ML IJ SOLN
20.0000 mg | Freq: Two times a day (BID) | INTRAMUSCULAR | Status: DC
  Administered 2015-12-19 – 2015-12-20 (×4): 20 mg via INTRAVENOUS
  Filled 2015-12-19 (×2): qty 2

## 2015-12-19 NOTE — Progress Notes (Signed)
1 Day Post-Op Procedure(s) (LRB): CORONARY ARTERY BYPASS GRAFTING (CABG) x 3, using left internal mammary artery and bilateral   leg greater saphenous vein harvested endoscopically  LIMA to LAD, SVG to Circumflex, SVG to RCA (N/A) TRANSESOPHAGEAL ECHOCARDIOGRAM (TEE) (N/A) Subjective: Some incisional pain- mild Transient nausea earlier  Objective: Vital signs in last 24 hours: Temp:  [95.2 F (35.1 C)-99.9 F (37.7 C)] 98.6 F (37 C) (12/02 0830) Pulse Rate:  [69-89] 69 (12/02 0830) Cardiac Rhythm: Atrial paced (12/02 0400) Resp:  [0-32] 16 (12/02 0830) BP: (84-134)/(55-87) 106/59 (12/02 0600) SpO2:  [95 %-100 %] 100 % (12/02 0830) Arterial Line BP: (89-152)/(40-70) 118/43 (12/02 0830) FiO2 (%):  [40 %-50 %] 40 % (12/01 1640) Weight:  [209 lb 7 oz (95 kg)] 209 lb 7 oz (95 kg) (12/02 0500)  Hemodynamic parameters for last 24 hours: PAP: (11-28)/(4-14) 24/4 CO:  [3.9 L/min-6.8 L/min] 6.8 L/min CI:  [1.8 L/min/m2-3.2 L/min/m2] 3.2 L/min/m2  Intake/Output from previous day: 12/01 0701 - 12/02 0700 In: 6804.9 [I.V.:4714.9; Blood:425; IV [id number] Out: [id number] [Urine:2030; Blood:800; Chest Tube:400] Intake/Output this shift: Total I/O In: 0  Out: 25 [Urine:25]  General appearance: alert, cooperative and no distress Neurologic: intact Heart: regular rate and rhythm Lungs: diminished breath sounds bibasilar  Lab Results:  Recent Labs  12/18/15 2027 12/19/15 0422  WBC 7.8 8.9  HGB 9.3* 9.4*  HCT 26.9* 27.5*  PLT 132* 170   BMET:  Recent Labs  12/18/15 0324  12/18/15 2020 12/18/15 2027 12/19/15 0422  NA 137  < > 137  --  134*  K 3.5  < > 4.1  --  3.9  CL 104  < > 104  --  105  CO2 24  --   --   --  24  GLUCOSE 90  < > 136*  --  135*  BUN 17  < > 13  --  13  CREATININE 0.83  < > 0.70 0.88 0.95  CALCIUM 9.1  --   --   --  8.0*  < > = values in this interval not displayed.  PT/INR:  Recent Labs  12/18/15 1345  LABPROT 19.1*  INR 1.59   ABG     Component Value Date/Time   PHART 7.370 12/18/2015 1839   HCO3 21.1 12/18/2015 1839   TCO2 23 12/18/2015 2020   ACIDBASEDEF 4.0 (H) 12/18/2015 1839   O2SAT 98.0 12/18/2015 1839   CBG (last 3)   Recent Labs  12/18/15 2326 12/19/15 0348 12/19/15 0753  GLUCAP 131* 122* 118*    Assessment/Plan: S/P Procedure(s) (LRB): CORONARY ARTERY BYPASS GRAFTING (CABG) x 3, using left internal mammary artery and bilateral   leg greater saphenous vein harvested endoscopically  LIMA to LAD, SVG to Circumflex, SVG to RCA (N/A) TRANSESOPHAGEAL ECHOCARDIOGRAM (TEE) (N/A) -  CV- s/p CABG- good hemodynamics- dc swan  Atrial fib last night- now in SR on amiodarone, brady so being atrial paced  Resume Eliquis prior to dc  RESP- IS for atelectasis  RENAL- creatinine OK, supplement K and diurese  ENDO- CBg well controlled, transitioned form drip to Q4  Anemia secondary to ABL- mild, follow  SCD + enoxaparin for DVT prophylaxis  DC CT  OOB. ambulate   LOS: 4 days    Melrose Nakayama 12/19/2015

## 2015-12-19 NOTE — Progress Notes (Signed)
      [address]       [place] [address]             [phone]      POD # 1  Ambulated entire unit  BP 104/64   Pulse 72   Temp 97.8 F (36.6 C) (Oral)   Resp (!) 22   Ht 6\' 1"  (1.854 m)   Wt 209 lb 7 oz (95 kg)   SpO2 100%   BMI 27.63 kg/m    Intake/Output Summary (Last 24 hours) at 12/19/15 1722 Last data filed at 12/19/15 1600  Gross per 24 hour  Intake          1717.84 ml  Output             1250 ml  Net           467.84 ml   Labs OK  Doing well  Remo Lipps C. Roxan Hockey, MD [facility] [phone]

## 2015-12-20 ENCOUNTER — Inpatient Hospital Stay (HOSPITAL_COMMUNITY): Payer: PPO

## 2015-12-20 DIAGNOSIS — R918 Other nonspecific abnormal finding of lung field: Secondary | ICD-10-CM | POA: Diagnosis not present

## 2015-12-20 LAB — CBC
HCT: 25.5 % — ABNORMAL LOW (ref 39.0–52.0)
Hemoglobin: 8.7 g/dL — ABNORMAL LOW (ref 13.0–17.0)
MCH: 33 pg (ref 26.0–34.0)
MCHC: 34.1 g/dL (ref 30.0–36.0)
MCV: 96.6 fL (ref 78.0–100.0)
PLATELETS: 135 10*3/uL — AB (ref 150–400)
RBC: 2.64 MIL/uL — ABNORMAL LOW (ref 4.22–5.81)
RDW: 12.4 % (ref 11.5–15.5)
WBC: 8.9 10*3/uL (ref 4.0–10.5)

## 2015-12-20 LAB — GLUCOSE, CAPILLARY
GLUCOSE-CAPILLARY: 116 mg/dL — AB (ref 65–99)
GLUCOSE-CAPILLARY: 117 mg/dL — AB (ref 65–99)
Glucose-Capillary: 119 mg/dL — ABNORMAL HIGH (ref 65–99)
Glucose-Capillary: 97 mg/dL (ref 65–99)
Glucose-Capillary: 110 mg/dL — ABNORMAL HIGH (ref 65–99)

## 2015-12-20 LAB — BASIC METABOLIC PANEL
Anion gap: 8 (ref 5–15)
BUN: 13 mg/dL (ref 6–20)
CHLORIDE: 102 mmol/L (ref 101–111)
CO2: 24 mmol/L (ref 22–32)
CREATININE: 0.9 mg/dL (ref 0.61–1.24)
Calcium: 8.3 mg/dL — ABNORMAL LOW (ref 8.9–10.3)
GFR calc Af Amer: >60 mL/min (ref >60)
GFR calc non Af Amer: >60 mL/min (ref >60)
Glucose, Bld: 133 mg/dL — ABNORMAL HIGH (ref 65–99)
Potassium: 3.6 mmol/L (ref 3.5–5.1)
SODIUM: 134 mmol/L — AB (ref 135–145)

## 2015-12-20 MED ORDER — AMIODARONE HCL 200 MG PO TABS
ORAL_TABLET | ORAL | Status: AC
  Filled 2015-12-20: qty 2

## 2015-12-20 MED ORDER — POTASSIUM CHLORIDE CRYS ER 20 MEQ PO TBCR
20.0000 meq | EXTENDED_RELEASE_TABLET | Freq: Four times a day (QID) | ORAL | Status: DC
  Administered 2015-12-20: 20 meq via ORAL
  Filled 2015-12-20: qty 1

## 2015-12-20 MED ORDER — POTASSIUM CHLORIDE 20 MEQ/15ML (10%) PO SOLN: 20.0000 meq | Freq: Four times a day (QID) | ORAL | Status: DC

## 2015-12-20 MED ORDER — MOVING RIGHT ALONG BOOK
Freq: Once | Status: DC
Start: 2015-12-20 — End: 2015-12-22
  Filled 2015-12-20 (×2): qty 1

## 2015-12-20 MED ORDER — SODIUM CHLORIDE 0.9 % IV SOLN: 250.0000 mL | INTRAVENOUS | Status: DC | PRN

## 2015-12-20 MED ORDER — FUROSEMIDE 40 MG PO TABS: 40.0000 mg | ORAL_TABLET | Freq: Every day | ORAL | Status: DC

## 2015-12-20 MED ORDER — INSULIN ASPART 100 UNIT/ML ~~LOC~~ SOLN: 0.0000 [IU] | Freq: Three times a day (TID) | SUBCUTANEOUS | Status: DC

## 2015-12-20 MED ORDER — METOPROLOL TARTRATE 25 MG PO TABS
25.0000 mg | ORAL_TABLET | Freq: Two times a day (BID) | ORAL | Status: DC
  Administered 2015-12-20: 25 mg via ORAL
  Administered 2015-12-20: 12.5 mg via ORAL
  Administered 2015-12-21 – 2015-12-22 (×3): 25 mg via ORAL
  Filled 2015-12-20 (×4): qty 1

## 2015-12-20 MED ORDER — ALUM & MAG HYDROXIDE-SIMETH 200-200-20 MG/5ML PO SUSP
15.0000 mL | ORAL | Status: DC | PRN
Start: 2015-12-20 — End: 2015-12-22

## 2015-12-20 MED ORDER — POTASSIUM CHLORIDE CRYS ER 20 MEQ PO TBCR
20.0000 meq | EXTENDED_RELEASE_TABLET | Freq: Two times a day (BID) | ORAL | Status: DC
  Administered 2015-12-20 – 2015-12-22 (×5): 20 meq via ORAL
  Filled 2015-12-20 (×4): qty 1

## 2015-12-20 MED ORDER — POTASSIUM CHLORIDE CRYS ER 20 MEQ PO TBCR
EXTENDED_RELEASE_TABLET | ORAL | Status: AC
  Filled 2015-12-20: qty 1

## 2015-12-20 MED ORDER — ZOLPIDEM TARTRATE 5 MG PO TABS
5.0000 mg | ORAL_TABLET | Freq: Every evening | ORAL | Status: DC | PRN
  Administered 2015-12-20 – 2015-12-21 (×2): 5 mg via ORAL
  Filled 2015-12-20 (×2): qty 1

## 2015-12-20 MED ORDER — MAGNESIUM HYDROXIDE 400 MG/5ML PO SUSP: 30.0000 mL | Freq: Every day | ORAL | Status: DC | PRN

## 2015-12-20 MED ORDER — SODIUM CHLORIDE 0.9% FLUSH
3.0000 mL | Freq: Two times a day (BID) | INTRAVENOUS | Status: DC
  Administered 2015-12-21 (×2): 3 mL via INTRAVENOUS

## 2015-12-20 MED ORDER — SODIUM CHLORIDE 0.9% FLUSH: 3.0000 mL | INTRAVENOUS | Status: DC | PRN

## 2015-12-20 MED ORDER — AMIODARONE HCL 200 MG PO TABS
400.0000 mg | ORAL_TABLET | Freq: Two times a day (BID) | ORAL | Status: DC
  Administered 2015-12-20 – 2015-12-21 (×3): 400 mg via ORAL
  Filled 2015-12-20 (×2): qty 2

## 2015-12-20 NOTE — Progress Notes (Signed)
2 Days Post-Op Procedure(s) (LRB): CORONARY ARTERY BYPASS GRAFTING (CABG) x 3, using left internal mammary artery and bilateral   leg greater saphenous vein harvested endoscopically  LIMA to LAD, SVG to Circumflex, SVG to RCA (N/A) TRANSESOPHAGEAL ECHOCARDIOGRAM (TEE) (N/A) Subjective: No complaints  Objective: Vital signs in last 24 hours: Temp:  [97.6 F (36.4 C)-99.3 F (37.4 C)] 98.6 F (37 C) (12/03 0740) Pulse Rate:  [65-84] 72 (12/03 0700) Cardiac Rhythm: Normal sinus rhythm (12/02 2000) Resp:  [14-30] 23 (12/03 0700) BP: (101-168)/(50-80) 107/67 (12/03 0700) SpO2:  [91 %-100 %] 96 % (12/03 0700) Arterial Line BP: (110-139)/(42-54) 129/54 (12/02 1030) Weight:  [209 lb 3.5 oz (94.9 kg)] 209 lb 3.5 oz (94.9 kg) (12/03 0600)  Hemodynamic parameters for last 24 hours: PAP: (24-33)/(4-11) 28/10  Intake/Output from previous day: 12/02 0701 - 12/03 0700 In: 1915.8 [P.O.:600; I.V.:900.8; IV Piggyback:415] Out: 955 [Urine:955] Intake/Output this shift: No intake/output data recorded.  General appearance: alert, cooperative and no distress Neurologic: intact Heart: regular rate and rhythm Lungs: clear to auscultation bilaterally Abdomen: normal findings: soft, non-tender Wound: dressing in place  Lab Results:  Recent Labs  12/19/15 1730 12/20/15 0330  WBC 9.7 8.9  HGB 9.5* 8.7*  HCT 28.0* 25.5*  PLT 164 135*   BMET:  Recent Labs  12/19/15 0422 12/19/15 1705 12/19/15 1730 12/20/15 0330  NA 134* 136  --  134*  K 3.9 4.1  --  3.6  CL 105 100*  --  102  CO2 24  --   --  24  GLUCOSE 135* 147*  --  133*  BUN 13 14  --  13  CREATININE 0.95 0.90 1.00 0.90  CALCIUM 8.0*  --   --  8.3*    PT/INR:  Recent Labs  12/18/15 1345  LABPROT 19.1*  INR 1.59   ABG    Component Value Date/Time   PHART 7.370 12/18/2015 1839   HCO3 21.1 12/18/2015 1839   TCO2 23 12/19/2015 1705   ACIDBASEDEF 4.0 (H) 12/18/2015 1839   O2SAT 98.0 12/18/2015 1839   CBG (last 3)    Recent Labs  12/19/15 2321 12/20/15 0316 12/20/15 0737  GLUCAP 118* 119* 116*    Assessment/Plan: S/P Procedure(s) (LRB): CORONARY ARTERY BYPASS GRAFTING (CABG) x 3, using left internal mammary artery and bilateral   leg greater saphenous vein harvested endoscopically  LIMA to LAD, SVG to Circumflex, SVG to RCA (N/A) TRANSESOPHAGEAL ECHOCARDIOGRAM (TEE) (N/A) Plan for transfer to step-down: see transfer orders  Doing well transfer to 2 west  CV- in SR, continue with metoprolol and amiodarone today  Resume Eliquis prior to dc once pacing wires out  RESP- IS  RENAL- weight still up. PO lasix, dc [place]  ENDO- CBG well controlled. Change to [facility] and HS  Continue ambulation  Anemia secondary to ABL- not symptomatic, follow   LOS: 5 days    Melrose Nakayama 12/20/2015

## 2015-12-21 ENCOUNTER — Encounter (HOSPITAL_COMMUNITY): Payer: Self-pay | Admitting: Cardiothoracic Surgery

## 2015-12-21 DIAGNOSIS — Z951 Presence of aortocoronary bypass graft: Secondary | ICD-10-CM

## 2015-12-21 DIAGNOSIS — I214 Non-ST elevation (NSTEMI) myocardial infarction: Secondary | ICD-10-CM | POA: Diagnosis not present

## 2015-12-21 DIAGNOSIS — E782 Mixed hyperlipidemia: Secondary | ICD-10-CM

## 2015-12-21 DIAGNOSIS — I10 Essential (primary) hypertension: Secondary | ICD-10-CM | POA: Diagnosis not present

## 2015-12-21 LAB — BASIC METABOLIC PANEL
Anion gap: 8 (ref 5–15)
BUN: 11 mg/dL (ref 6–20)
CHLORIDE: 102 mmol/L (ref 101–111)
CO2: 24 mmol/L (ref 22–32)
Calcium: 8.4 mg/dL — ABNORMAL LOW (ref 8.9–10.3)
Creatinine, Ser: 0.95 mg/dL (ref 0.61–1.24)
GFR calc non Af Amer: >60 mL/min (ref >60)
Glucose, Bld: 114 mg/dL — ABNORMAL HIGH (ref 65–99)
POTASSIUM: 3.7 mmol/L (ref 3.5–5.1)
SODIUM: 134 mmol/L — AB (ref 135–145)

## 2015-12-21 LAB — GLUCOSE, CAPILLARY
Glucose-Capillary: 102 mg/dL — ABNORMAL HIGH (ref 65–99)
Glucose-Capillary: 90 mg/dL (ref 65–99)
Glucose-Capillary: 107 mg/dL — ABNORMAL HIGH (ref 65–99)
Glucose-Capillary: 118 mg/dL — ABNORMAL HIGH (ref 65–99)

## 2015-12-21 LAB — POCT I-STAT, CHEM 8
BUN: 13 mg/dL (ref 6–20)
CALCIUM ION: 1.15 mmol/L (ref 1.15–1.40)
CREATININE: 0.6 mg/dL — AB (ref 0.61–1.24)
Chloride: 103 mmol/L (ref 101–111)
GLUCOSE: 112 mg/dL — AB (ref 65–99)
HCT: 24 % — ABNORMAL LOW (ref 39.0–52.0)
Hemoglobin: 8.2 g/dL — ABNORMAL LOW (ref 13.0–17.0)
Potassium: 3.8 mmol/L (ref 3.5–5.1)
SODIUM: 139 mmol/L (ref 135–145)
TCO2: 26 mmol/L (ref 0–100)

## 2015-12-21 LAB — CBC
HEMATOCRIT: 24.2 % — AB (ref 39.0–52.0)
HEMOGLOBIN: 8.4 g/dL — AB (ref 13.0–17.0)
MCH: 33.5 pg (ref 26.0–34.0)
MCHC: 34.7 g/dL (ref 30.0–36.0)
MCV: 96.4 fL (ref 78.0–100.0)
Platelets: 164 10*3/uL (ref 150–400)
RBC: 2.51 MIL/uL — AB (ref 4.22–5.81)
RDW: 12.2 % (ref 11.5–15.5)
WBC: 10.1 10*3/uL (ref 4.0–10.5)

## 2015-12-21 MED ORDER — AMIODARONE HCL 200 MG PO TABS
200.0000 mg | ORAL_TABLET | Freq: Two times a day (BID) | ORAL | Status: DC
  Administered 2015-12-21 – 2015-12-22 (×2): 200 mg via ORAL
  Filled 2015-12-21 (×2): qty 1

## 2015-12-21 MED ORDER — FUROSEMIDE 10 MG/ML IJ SOLN
40.0000 mg | Freq: Once | INTRAMUSCULAR | Status: AC
  Administered 2015-12-21: 40 mg via INTRAVENOUS
  Filled 2015-12-21: qty 4

## 2015-12-21 MED ORDER — FOLIC ACID 1 MG PO TABS
1.0000 mg | ORAL_TABLET | Freq: Every day | ORAL | Status: DC
  Administered 2015-12-21 – 2015-12-22 (×2): 1 mg via ORAL
  Filled 2015-12-21 (×2): qty 1

## 2015-12-21 MED ORDER — FERROUS SULFATE 325 (65 FE) MG PO TABS
325.0000 mg | ORAL_TABLET | Freq: Three times a day (TID) | ORAL | Status: DC
  Administered 2015-12-21 – 2015-12-22 (×3): 325 mg via ORAL
  Filled 2015-12-21 (×3): qty 1

## 2015-12-21 MED ORDER — APIXABAN 5 MG PO TABS
5.0000 mg | ORAL_TABLET | Freq: Two times a day (BID) | ORAL | Status: DC
  Administered 2015-12-21 – 2015-12-22 (×2): 5 mg via ORAL
  Filled 2015-12-21 (×2): qty 1

## 2015-12-21 NOTE — Progress Notes (Signed)
CARDIAC REHAB PHASE I   PRE:  Rate/Rhythm: 110 ? a fib  BP:  Sitting: 116/61        SaO2: 96 RA  MODE:  Ambulation: 350 ft   POST:  Rate/Rhythm: 128  BP:  Sitting: 131/58         SaO2: 99 RA  Pt HR elevated, pt states he was just up brushing his teeth, agreeable to walk. Required verbal reminder cues for sternal precautions. Pt ambulated 350 ft on RA, rolling walker, assist x1, steady gait, tolerated fairly well. Pt c/o DOE, standing rest x3. Encouraged IS, additional ambulation x2 today. Pt to recliner after walk, feet elevated, call bell within reach. Will follow.   [id number] Lenna Sciara, RN, BSN 12/21/2015 9:09 AM

## 2015-12-21 NOTE — Progress Notes (Addendum)
      [address]       [place] [address]             [phone]      3 Days Post-Op Procedure(s) (LRB): CORONARY ARTERY BYPASS GRAFTING (CABG) x 3, using left internal mammary artery and bilateral   leg greater saphenous vein harvested endoscopically  LIMA to LAD, SVG to Circumflex, SVG to RCA (N/A) TRANSESOPHAGEAL ECHOCARDIOGRAM (TEE) (N/A)   Subjective:  Mr. Bachand has no complaints. He denies pain.  + ambulation   + BM  Objective: Vital signs in last 24 hours: Temp:  [98.4 F (36.9 C)-100.1 F (37.8 C)] 100.1 F (37.8 C) (12/04 0514) Pulse Rate:  [73-106] 106 (12/04 0514) Cardiac Rhythm: Normal sinus rhythm (12/03 1900) Resp:  [14-27] 20 (12/04 0514) BP: (104-151)/(42-65) 119/64 (12/04 0514) SpO2:  [94 %-98 %] 94 % (12/04 0514) Weight:  [205 lb 1.6 oz (93 kg)] 205 lb 1.6 oz (93 kg) (12/04 0514)  Intake/Output from previous day: 12/03 0701 - 12/04 0700 In: 716.8 [P.O.:600; I.V.:66.8; IV Piggyback:50] Out: [id number]  General appearance: alert, cooperative and no distress Heart: regular rate and rhythm Lungs: clear to auscultation bilaterally Abdomen: soft, non-tender; bowel sounds normal; no masses,  no organomegaly Extremities: edema trace Wound: clean and dry  Lab Results:  Recent Labs  12/20/15 0330 12/21/15 0154  WBC 8.9 10.1  HGB 8.7* 8.4*  HCT 25.5* 24.2*  PLT 135* 164   BMET:  Recent Labs  12/20/15 0330 12/21/15 0154  NA 134* 134*  K 3.6 3.7  CL 102 102  CO2 24 24  GLUCOSE 133* 114*  BUN 13 11  CREATININE 0.90 0.95  CALCIUM 8.3* 8.4*    PT/INR:  Recent Labs  12/18/15 1345  LABPROT 19.1*  INR 1.59   ABG    Component Value Date/Time   PHART 7.370 12/18/2015 1839   HCO3 21.1 12/18/2015 1839   TCO2 23 12/19/2015 1705   ACIDBASEDEF 4.0 (H) 12/18/2015 1839   O2SAT 98.0 12/18/2015 1839   CBG (last 3)   Recent Labs  12/20/15 1624 12/20/15 2202 12/21/15 0634  GLUCAP 110* 117* 102*     Assessment/Plan: S/P Procedure(s) (LRB): CORONARY ARTERY BYPASS GRAFTING (CABG) x 3, using left internal mammary artery and bilateral   leg greater saphenous vein harvested endoscopically  LIMA to LAD, SVG to Circumflex, SVG to RCA (N/A) TRANSESOPHAGEAL ECHOCARDIOGRAM (TEE) (N/A)  1. CV- H/O PAF preop, maintaining NSR- will continue Lopressor, BP is labile hold ACE for now... Will restart Eliquis this evening after wires are removed 2. Pulm- no acute issues, continue IS 3. Renal- creatinine WNL, remains hypervolemic- will continue Lasix 4. Expected post operative blood loss anemia- Hgb at 8.4, will supplement with iron 5. Dispo- patient stable, d/c EPW today, restart Eliquis, possibly home in Am if remains stable   LOS: 6 days    BARRETT, ERIN 12/21/2015  brief post op afib converted on Amiodarone , Atril clip not done due to lack of TEE, unable to be passed  Expected Acute  Blood - loss Anemia Wires out and resume Eliquis  Poss home in am I have seen and examined [name] and agree with the above assessment  and plan.  Grace Isaac MD Beeper [phone] Office [phone] 12/21/2015 10:12 AM

## 2015-12-21 NOTE — Discharge Summary (Signed)
Physician Discharge Summary  Patient ID: [name] MRN: [id number] DOB/AGE: September 07, 1940 75 y.o.  Admit date: 12/15/2015 Discharge date: 12/22/2015  Admission Diagnoses:  Patient Active Problem List   Diagnosis Date Noted  . NSTEMI (non-ST elevated myocardial infarction) ([place]) 12/15/2015  . Carotid stenosis 02/25/2015  . Essential hypertension 02/25/2015  . Stroke ([place]) 02/22/2015  . CVA (cerebral infarction) 02/22/2015  . Cerebrovascular accident (CVA) due to occlusion of cerebral artery ([place])   . Renal insufficiency   . Paroxysmal atrial fibrillation ([place]) 09/12/2014  . Migraine aura without headache 09/10/2013  . Peripheral vascular disease, unspecified ([place]) 10/05/2012  . Abdominal aneurysm without mention of rupture 10/05/2012  . Thoracic aneurysm, ruptured ([place]) 10/05/2012  . Pure hypercholesterolemia 10/05/2012  . Coronary atherosclerosis of native coronary artery 10/05/2012  . Other and unspecified hyperlipidemia 10/05/2012  . Atherosclerosis of renal artery ([place]) 10/05/2012  . Other specified type of hydrocele 10/05/2012  . Family history of malignant neoplasm of gastrointestinal tract 10/05/2012  . Bronchitis, not specified as acute or chronic 10/05/2012  . Nocturia   . Arthritis   . History of AAA (abdominal aortic aneurysm) repair   . Status post primary angioplasty with coronary stent   . 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 Retinal detachment   . Increased intraocular pressure   . History of inguinal herniorrhaphy   . History of diverticulosis   . Hydrocele 01/24/2011  . Hydrocele, left 01/17/2011   Discharge Diagnoses:   Patient Active Problem List   Diagnosis Date Noted  . NSTEMI (non-ST elevated myocardial infarction) ([place]) 12/15/2015  . Carotid stenosis 02/25/2015  . Essential hypertension 02/25/2015  . Stroke ([place]) 02/22/2015  . CVA (cerebral infarction) 02/22/2015  . Cerebrovascular accident (CVA) due to occlusion of cerebral artery ([place])    . Renal insufficiency   . Paroxysmal atrial fibrillation ([place]) 09/12/2014  . Migraine aura without headache 09/10/2013  . Peripheral vascular disease, unspecified ([place]) 10/05/2012  . Abdominal aneurysm without mention of rupture 10/05/2012  . Thoracic aneurysm, ruptured ([place]) 10/05/2012  . Pure hypercholesterolemia 10/05/2012  . Coronary atherosclerosis of native coronary artery 10/05/2012  . Other and unspecified hyperlipidemia 10/05/2012  . Atherosclerosis of renal artery ([place]) 10/05/2012  . Other specified type of hydrocele 10/05/2012  . Family history of malignant neoplasm of gastrointestinal tract 10/05/2012  . Bronchitis, not specified as acute or chronic 10/05/2012  . Nocturia   . Arthritis   . History of AAA (abdominal aortic aneurysm) repair   . Status post primary angioplasty with coronary stent   . 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 Retinal detachment   . Increased intraocular pressure   . History of inguinal herniorrhaphy   . History of diverticulosis   . Hydrocele 01/24/2011  . Hydrocele, left 01/17/2011   Discharged Condition: good  History of Present Illness:  [name] is a 75 yo white male with previous history of CAD S/P BMS x 2 placed in 2002. He has a significant PMH of HTN, HLD, PAF on Eliquis (last dose 11/27), bilateral carotid artery disease S/P L CEA performed by Dr. Donnetta Hutching, AAA repair with Aortofemoral bypass also performed by Dr. Donnetta Hutching, and H/O TIA.  He follows routinely with Dr. Radford Pax and during his last visit 09/2015 he was doing well.  He presented to the ED on 12/15/2015 with complaints of left arm numbness and blurred vision.  The patient stated he was in his usual state of health until the morning of presentation.  He woke up and  went to get his newspaper at which time he developed left arm numbness.  He states this was similar to his symptoms he developed prior to his stents being placed in 2002.  He returned back inside and started to read the  newspaper but noticed his vision was blurry.  These symptoms lasted approximately 15-20 minutes.  The patient contacted EMS and was brought to the ED for evaluation.  Upon arrival the patient denied chest pain and shortness of breath.  He continued to have left arm numbness.  EKG was unremarkable.  Initial troponin level was negative.  His neurologic workup was also WNL.  Cardiology consult was obtained due to similarity in symptoms to when stents were placed.  They felt patient could be discharged if  repeat Troponin level was WNL.  However, this came back elevated and patient was admitted for further care.  Hospital Course:   Upon admission patient remained chest pain free. He was taken to the catheterization lab and was found to have 2 Vessel CAD with LM involvement. His previously placed stents were patent.  It was felt coronary bypass grafting would be indicated andTCTS consult was obtained.  He was evaluated by Dr. Servando Snare who was in agreement with treatment with coronary bypass grafting procedure.  The risks and benefits of the procedure were explained to the patient and he was agreeable to proceed.  He was taken to the operating room on 12/18/2015. He underwent CABG x 3 utilizing LIMA to LAD, SVG to RCA, and SVG to Left Circumflex.  He also underwent bilateral endoscopic harvest of greater saphenous vein from right and left thigh.  He tolerated the procedure without difficulty and was taken to the SICU in stable condition.  He was extubated the evening of surgery.  During his stay in the SICU the patients chest tubes and arterial lines were removed without difficulty.  He developed post operative Atrial Fibrillation and was treated with Amiodarone, with conversion to NSR. He was ambulating independently in the SICU and was felt medically stable for transfer to the stepdown unit on POD #2.  The patient continues to do well.  He continues to maintain NSR.  His pacing wires were removed without  difficulty.  He was restarted on home Eliquis for H/O PAF.  He continues to ambulate without difficulty.  His wounds are healing well.  His pain is well controlled and he is tolerating a regular diet.  He is felt medically stable for discharge home today.    Significant Diagnostic Studies: angiography:    The left ventricular systolic function is normal.  LV end diastolic pressure is normal.  The left ventricular ejection fraction is 55-65% by visual estimate.  There is no aortic valve stenosis.  Prox RCA lesion, 70 %stenosed.  LM lesion, 60 %stenosed.  Ost LM to LM lesion, 70 %stenosed.  Ost 1st Mrg to 1st Mrg lesion, 40 %stenosed.  Ost LAD to Prox LAD lesion, 30 %stenosed.  Ost 1st Diag to 1st Diag lesion, 20 %stenosed.  Treatments: surgery:   CORONARY ARTERY BYPASS GRAFTING (CABG) x 3 -LIMA to LAD -SVG to LEFT CIRCUMFLEX -SVG to RCA  Disposition: 01-Home or Self Care   Discharge Medications:  The patient has been discharged on:   1.Beta Blocker:  Yes [ x  ]                              No   [   ]  If No, reason:  2.Ace Inhibitor/ARB: Yes [ x  ]                                     No  [    ]                                     If No, reason:  3.Statin:   Yes [ x  ]                  No  [   ]                  If No, reason:  4.Ecasa:  Yes  [ x  ]                  No   [   ]                  If No, reason:        Medication List    STOP taking these medications   hydrochlorothiazide 25 MG tablet Commonly known as:  HYDRODIURIL     TAKE these medications   acetaminophen 500 MG tablet Commonly known as:  TYLENOL Take 2 tablets (1,000 mg total) by mouth every 6 (six) hours as needed.   amiodarone 200 MG tablet Commonly known as:  PACERONE Take 1 tablet (200 mg total) by mouth 2 (two) times daily. For 7 Days, then decrease to 200 mg daily   apixaban 5 MG Tabs tablet Commonly known as:  ELIQUIS Take 1 tablet (5 mg  total) by mouth 2 (two) times daily.   aspirin EC 81 MG tablet Take 81 mg by mouth daily.   b complex vitamins tablet Take 1 tablet by mouth daily.   calcium carbonate 600 MG Tabs tablet Commonly known as:  OS-CAL Take 600 mg by mouth daily.   Cholecalciferol 1000 units capsule Take 2,000 Units by mouth daily.   Co Q 10 100 MG Caps Take 1 capsule by mouth daily.   cyanocobalamin 1000 MCG tablet Take 1,000 mcg by mouth daily.   diltiazem 300 MG 24 hr capsule Commonly known as:  TIAZAC Take 300 mg by mouth as needed.   EPIPEN 0.3 mg/0.3 mL Soaj injection Generic drug:  EPINEPHrine Inject 0.3 mg into the muscle daily as needed (allergic reaction).   finasteride 5 MG tablet Commonly known as:  PROSCAR Take 5 mg by mouth daily.   FISH OIL PO Take 1,200 mg by mouth daily.   furosemide 40 MG tablet Commonly known as:  LASIX Take 1 tablet (40 mg total) by mouth daily. For 7 days   losartan 25 MG tablet Commonly known as:  COZAAR Take 1 tablet (25 mg total) by mouth daily. What changed:  medication strength  how much to take   MAGNESIA PO Take 1,000 mg by mouth daily.   metoprolol tartrate 25 MG tablet Commonly known as:  LOPRESSOR Take 1 tablet (25 mg total) by mouth 2 (two) times daily.   multivitamin tablet Take 1 tablet by mouth daily.   niacinamide 500 MG tablet Take 500 mg by mouth daily.   oxyCODONE 5 MG immediate release tablet Commonly known as:  Oxy IR/ROXICODONE Take 1-2 tablets (5-10 mg total) by mouth every 3 (  three) hours as needed for severe pain. What changed:  how much to take  when to take this  reasons to take this   potassium chloride SA 20 MEQ tablet Commonly known as:  K-DUR,KLOR-CON Take 1 tablet (20 mEq total) by mouth daily. For 7 Days   potassium gluconate 595 (99 K) MG Tabs tablet Take 595 mg by mouth.   rosuvastatin 40 MG tablet Commonly known as:  CRESTOR Take 1 tablet (40 mg total) by mouth every evening.    tamsulosin 0.4 MG Caps capsule Commonly known as:  FLOMAX Take 0.4 mg by mouth daily.   vitamin C 500 MG tablet Commonly known as:  ASCORBIC ACID Take 2,000 mg by mouth daily.      Follow-up Information    [facility], PA-C. Go on 12/31/2015.   Specialties:  Cardiology, Radiology Why:  @9 :00am for post ER visit Contact information: [address] [place] [address] [phone]        Grace Isaac, MD Follow up on 01/04/2016.   Specialty:  Cardiothoracic Surgery Why:  Appointment is at 1:30, please get CXR at 1:00 at [facility] located on first floor of our office building Contact information: [name] [address] [phone]           Signed: Ellwood Handler 12/22/2015, 8:34 AM

## 2015-12-21 NOTE — Care Management Important Message (Signed)
Important Message  Patient Details  Name: SAID SCHNACK MRN: [id number] Date of Birth: [birth year]   Medicare Important Message Given:  Yes    Myrtice Lowdermilk Abena 12/21/2015, 11:20 AM

## 2015-12-21 NOTE — Progress Notes (Addendum)
Patient Name: [name] Date of Encounter: 12/21/2015  Active Problems:   NSTEMI (non-ST elevated myocardial infarction) ([place])   Length of Stay: 6  SUBJECTIVE  The patient denies chest pain or SOB.   CURRENT MEDS . acetaminophen  1,000 mg Oral Q6H   Or  . acetaminophen (TYLENOL) oral liquid 160 mg/5 mL  1,000 mg Per Tube Q6H  . amiodarone  400 mg Oral BID  . apixaban  5 mg Oral BID  . aspirin EC  81 mg Oral Daily  . bisacodyl  10 mg Oral Daily   Or  . bisacodyl  10 mg Rectal Daily  . chlorhexidine gluconate (MEDLINE KIT)  15 mL Mouth Rinse BID  . docusate sodium  200 mg Oral Daily  . ferrous sulfate  325 mg Oral TID WC  . finasteride  5 mg Oral Daily  . folic acid  1 mg Oral Daily  . insulin aspart  0-15 Units Subcutaneous TID WC  . mouth rinse  15 mL Mouth Rinse QID  . metoprolol tartrate  25 mg Oral BID  . moving right along book   Does not apply Once  . pantoprazole  40 mg Oral Daily  . potassium chloride  20 mEq Oral BID  . rosuvastatin  40 mg Oral QPM  . sodium chloride flush  3 mL Intravenous Q12H  . tamsulosin  0.4 mg Oral Daily    OBJECTIVE  Vitals:   12/21/15 1105 12/21/15 1122 12/21/15 1147 12/21/15 1205  BP: 136/62 133/62 (!) 122/57 (!) 115/57  Pulse: 83 77 77 77  Resp:      Temp:      TempSrc:      SpO2:      Weight:      Height:        Intake/Output Summary (Last 24 hours) at 12/21/15 1217 Last data filed at 12/21/15 1131  Gross per 24 hour  Intake              240 ml  Output             1745 ml  Net            -1505 ml   Filed Weights   12/19/15 0500 12/20/15 0600 12/21/15 0514  Weight: 209 lb 7 oz (95 kg) 209 lb 3.5 oz (94.9 kg) 205 lb 1.6 oz (93 kg)    PHYSICAL EXAM  General: Pleasant, NAD. Neuro: Alert and oriented X 3. Moves all extremities spontaneously. Psych: Normal affect. HEENT:  Normal  Neck: Supple without bruits or JVD. Lungs:  Resp regular and unlabored, CTA. Heart: RRR no s3, s4, or murmurs. Abdomen:  Soft, non-tender, non-distended, BS + x 4.  Extremities: No clubbing, cyanosis or edema. DP/PT/Radials 2+ and equal bilaterally.  Accessory Clinical Findings  CBC  Recent Labs  12/20/15 0330 12/21/15 0154  WBC 8.9 10.1  HGB 8.7* 8.4*  HCT 25.5* 24.2*  MCV 96.6 96.4  PLT 135* 195   Basic Metabolic Panel  Recent Labs  12/19/15 0422  12/19/15 1730 12/20/15 0330 12/21/15 0154  NA 134*  < >  --  134* 134*  K 3.9  < >  --  3.6 3.7  CL 105  < >  --  102 102  CO2 24  --   --  24 24  GLUCOSE 135*  < >  --  133* 114*  BUN 13  < >  --  13 11  CREATININE 0.95  < >  1.00 0.90 0.95  CALCIUM 8.0*  --   --  8.3* 8.4*  MG 2.2  --  2.2  --   --   < > = values in this interval not displayed.  Radiology/Studies  Dg Chest 2 View  Result Date: 12/15/2015 CLINICAL DATA:  Upper extremity pain on the left with blurred vision EXAM: CHEST  2 VIEW COMPARISON:  Jun 10, 2014 FINDINGS: There is no edema or consolidation. Heart size and pulmonary vascularity are normal. There is atherosclerotic calcification in the aorta. There is calcification in the mitral annulus. Foci of coronary artery calcification noted. There is degenerative change in the thoracic spine with anterior wedging of several mid to lower thoracic vertebral bodies, stable. There is increase in kyphosis. IMPRESSION: No edema or consolidation. Aortic atherosclerosis. Foci of coronary artery calcification also noted in the left anterior descending and right coronary artery regions. Stable appearance of thoracic spine. Electronically Signed   By: Lowella Grip III M.D.   On: 12/15/2015 09:11   Dg Chest Port 1 View  Result Date: 12/20/2015 CLINICAL DATA:  Status post CABG. EXAM: PORTABLE CHEST 1 VIEW COMPARISON:  December 19, 2015 FINDINGS: A right IJ sheath remains. The PA catheter is been removed. No pneumothorax. Probable small effusion and underlying atelectasis in the left greater than right bases. No overt edema. IMPRESSION: Support  apparatus as above. Left greater than right bibasilar opacities, likely atelectasis and pleural fluid. Electronically Signed   By: Dorise Bullion III M.D   On: 12/20/2015 09:16   Dg Chest Port 1 View  Result Date: 12/19/2015 CLINICAL DATA:  Status post CABG. EXAM: PORTABLE CHEST 1 VIEW COMPARISON:  December 18, 2015 FINDINGS: ET and NG tubes have been removed. Remaining support apparatus is in good position. Small bilateral layering effusions with underlying opacities, more prominent on the right and similar on the left in the interval. No overt edema. Stable cardiomediastinal silhouette. IMPRESSION: 1. Support apparatus as above. 2. Small effusions with underlying atelectasis. Electronically Signed   By: Dorise Bullion III M.D   On: 12/19/2015 08:28   Dg Chest Port 1 View  Result Date: 12/18/2015 CLINICAL DATA:  CABG. EXAM: PORTABLE CHEST 1 VIEW COMPARISON:  12/15/2015. FINDINGS: Endotracheal tube tip noted 5.4 cm above the carina, NG tube noted with tip below left hemidiaphragm. Swan-Ganz catheter tip noted projected the pulmonary outflow tract. Left chest tube noted over the left lateral chest. Prior CABG. Cardiomegaly. Low lung volumes with basilar atelectasis. Bibasilar infiltrates cannot be excluded. No pleural effusion or pneumothorax. IMPRESSION: 1. Lines and tubes appear to be in good anatomic position. Left chest tube noted over the left chest. No pneumothorax. 2. Prior CABG.  Cardiomegaly. 3. Low lung volumes with basilar atelectasis. Bibasilar infiltrates cannot be excluded. Electronically Signed   By: Marcello Moores  Register   On: 12/18/2015 14:56   TTE: 12/20/2015 - Left ventricle: The cavity size was normal. Wall thickness was   increased in a pattern of moderate LVH. Systolic function was   normal. The estimated ejection fraction was in the range of 60%   to 65%. Wall motion was normal; there were no regional wall   motion abnormalities. Doppler parameters are consistent with   abnormal left  ventricular relaxation (grade 1 diastolic   dysfunction). The E/e&' ratio is between 8-15, suggesting   indeteminate LV filling pressure. - Aortic valve: Trileaflet; mildly calcified leaflets. There was no   stenosis. There was no significant regurgitation. - Mitral valve: Moderate posterior calcified annulus.  Mildly   thickened leaflets . - Left atrium: Severely dilated at 58 ml/m2. - Right ventricle: The cavity size was mildly dilated. Systolic   function was normal. - Right atrium: The atrium was mildly dilated. - Atrial septum: Aneurysmal IAS - PFO cannot be excluded. - Tricuspid valve: There was mild regurgitation. - Pulmonary arteries: PA peak pressure: 26 mm Hg (S). - Inferior vena cava: The vessel was normal in size. The   respirophasic diameter changes were in the normal range (= 50%),   consistent with normal central venous pressure.  Impressions:  - Compared with a prior study in 07/2014, there are few changes   except there is now severe LAE. An atrial septal aneurysm is   noted, PFO cannot be excluded.  TELE: SR   ASSESSMENT AND PLAN  75 year old male   1. CAD, s/p CABG x 3, using left internal mammary artery and bilateral   leg greater saphenous vein harvested endoscopically  LIMA to LAD, SVG to Circumflex, SVG to RCA.  Admitted with NSTEMI, No Plavix as he is on Eliquis. LVEF 60-65%. Appears euvolemic. Onmetoprolol and rosuvastatin.  2. PAF - also post op a-fib, cardioverted on amiodarone iv, now on po amiodarone, currently in SR, but telemetry shows multiple episodes of short runs of a-fib with RVR, continue amiodarone 400 mg po BID x 3 days, then amiodarone 200 mg po BID x 3 days, then amiodarone 200 mg po daily. Eliquis restarted yesterday. Hb 8.4, follow closely.  I would continue amiodarone for the next 30 days and reevaluate at that point, possibly will need Holter monitor and if negative, d/c amiodarone.   3. HTN - controlled  4. HLP on rosuvastatin  We  will arrange for an outpatient follow up.    Signed, Ena Dawley MD, [facility] 12/21/2015

## 2015-12-22 LAB — GLUCOSE, CAPILLARY: Glucose-Capillary: 101 mg/dL — ABNORMAL HIGH (ref 65–99)

## 2015-12-22 MED ORDER — POTASSIUM CHLORIDE CRYS ER 20 MEQ PO TBCR: 20.0000 meq | EXTENDED_RELEASE_TABLET | Freq: Every day | ORAL | 0 refills | Status: DC

## 2015-12-22 MED ORDER — FUROSEMIDE 40 MG PO TABS: 40.0000 mg | ORAL_TABLET | Freq: Every day | ORAL | 0 refills | Status: DC

## 2015-12-22 MED ORDER — LOSARTAN POTASSIUM 25 MG PO TABS: 25.0000 mg | ORAL_TABLET | Freq: Every day | ORAL | 3 refills | Status: DC

## 2015-12-22 MED ORDER — OXYCODONE HCL 5 MG PO TABS: 5.0000 mg | ORAL_TABLET | ORAL | 0 refills | Status: DC | PRN

## 2015-12-22 MED ORDER — METOPROLOL TARTRATE 25 MG PO TABS
25.0000 mg | ORAL_TABLET | Freq: Two times a day (BID) | ORAL | 3 refills | Status: DC
Start: 2015-12-22 — End: 2016-01-05

## 2015-12-22 MED ORDER — ACETAMINOPHEN 500 MG PO TABS: 1000.0000 mg | ORAL_TABLET | Freq: Four times a day (QID) | ORAL | 0 refills | Status: AC | PRN

## 2015-12-22 MED ORDER — AMIODARONE HCL 200 MG PO TABS: 200.0000 mg | ORAL_TABLET | Freq: Two times a day (BID) | ORAL | 0 refills | Status: DC

## 2015-12-22 MED ORDER — LOSARTAN POTASSIUM 25 MG PO TABS
25.0000 mg | ORAL_TABLET | Freq: Every day | ORAL | Status: DC
  Administered 2015-12-22: 25 mg via ORAL
  Filled 2015-12-22: qty 1

## 2015-12-22 NOTE — Op Note (Signed)
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  SURGICAL PROCEDURE:  Coronary artery bypass grafting x3 with left internal mammary to the left anterior descending coronary artery, reverse saphenous vein graft to the circumflex coronary artery, and reverse saphenous vein graft to the distal right coronary artery with bilateral thigh greater saphenous vein harvesting.  SURGEON:  Lanelle Bal, MD.  FIRST ASSISTANT:  Providence Crosby, PA.  BRIEF HISTORY:  The patient is a 75 year old male, who had known coronary occlusive disease with previous stents in the LAD, who presented with recurrent left arm pain suggestive of the angina that he had previously.  Cardiac catheterization was performed by Dr. Fletcher Anon which demonstrated 70% distal left main and ostial left main difficult to judge, but at the time of catheterization resulted in ventricularization of the waveform and was suggestive of an ostial stenosis.  In addition, the patient had a mid 70% obstruction.  His LAD stent was intact.  He has a history of paroxysmal atrial fibrillation infrequently, but had been on Eliquis.  A previous echo done in February 2017, showed a dilated atrium, significant calcification of the mitral valve anulus, but no mitral regurgitation.  During the patient's preop hospitalization, he had no atrial fibrillation, he remained in sinus rhythm.  Prior to  surgery, we had discussed atrial clipping and potential pulmonary vein ablation; however, at the time of his surgery,  3 anesthesiologists  had attempted to place a TEE probe and were unsuccessful, and for this reason we did not run the risk of manipulating the atrial appendage without visualization in insurance that there was no atrial clot.  Risks and options of this procedure were discussed with the patient in detail and he was willing to proceed.  DESCRIPTION OF THE PROCEDURE:  With Swan-Ganz arterial line monitors in place, the patient underwent general endotracheal anesthesia without incident.  The skin of chest and legs was prepped with Betadine and draped in usual sterile manner.  As noted, Dr. Lynn Ito, Dr. Oletta Lamas, and Dr. Suann Larry, attempted to place TEE, but were unable, PA pressures were low.  An appropriate time-out was performed, and we then proceeded with harvesting of vein.  Initially, a small incision was made at the right knee and a segment of vein was harvested endoscopically the right greater saphenous vein.  Because of the patient's known peripheral vascular disease and the relatively small size of the vein going into his lower leg, a second segment of vein was harvested from the left thigh in a similar fashion.  Median sternotomy was performed.  Left internal mammary artery was dissected down as a pedicle graft.  The distal artery was divided and had good free flow.  Pericardium was opened.  Overall, ventricular function appeared preserved.  The patient was systemically  heparinized.  Ascending aorta was cannulated.  The right atrium was cannulated.  An aortic root vent cardioplegia needle was introduced into the ascending aorta.  The patient was placed on cardiopulmonary bypass 2.4 L/min/m2.  Sites of anastomosis were selected and dissected out of the epicardium.  The patient's body temperature was cooled to 32 degrees.  Aortic crossclamp was applied, 500 mL of cold blood potassium cardioplegia was administered with diastolic arrest of the heart.  Myocardial septal temperature was monitored  throughout the crossclamp.  Attention was turned first to the distal right coronary artery, which was opened and was a large vessel, admitted a 1.5 mm probe into the posterior descending.  Using a running 7-0 Prolene, distal anastomosis was performed.  Attention was then turned to the circumflex coronary artery, which was opened and was 1.5 mm in size.  Using a running 7-0 Prolene, distal anastomosis was performed with a second reverse saphenous vein graft.  The mid left anterior descending coronary artery was opened, admitted a 1.5 mm probe.  Using a running 8-0 Prolene, left internal mammary artery was anastomosed to left anterior descending coronary artery.  With release of Edwards bulldog on the mammary artery, there was rise in myocardial septal temperature.  The bulldog was placed back on the mammary artery.  Additional cold blood cardioplegia was administered.  Two punch aortotomies were performed and each of the two vein grafts were anastomosed to the ascending aorta. The bulldog was removed from the mammary artery with rise in myocardial septal temperature.  The heart was allowed to passively fill and de- aired and the proximal anastomoses were completed.  The crossclamp was removed with a total crossclamp time of 69 minutes.  The patient required electric defibrillation and returned to a sinus rhythm with the body temperature rewarmed to 37 degrees.  He was then ventilated and weaned from cardiopulmonary bypass without difficulty.  He remained hemodynamically stable.  He was decannulated in the usual fashion. Protamine sulfate was administered with operative field hemostatic. Atrial and ventricular pacing wires had been applied.  A left the pleural tube and Blake mediastinal drain were left in place. Pericardium was loosely reapproximated.  Sternum was closed with #6 stainless steel wire.  Fascia closed with interrupted 0 Vicryl, running 3-0 Vicryl in the subcutaneous tissue,  4-0 subcuticular stitch in skin edges.  Dry dressings were applied.  Sponge and needle counts were reported as correct at the completion of the procedure.  The patient tolerated the procedure without obvious complication and was transferred to the Surgical Intensive Care Unit for further postoperative care.  Total pump time was 88 minutes.  The patient did not require any blood bank or blood products during the procedure.     Lanelle Bal, MD     EG/MEDQ  D:  12/21/2015  T:  12/22/2015  Job:  [id number]

## 2015-12-22 NOTE — Progress Notes (Signed)
[name] to be D/C'd Home per MD order. Discussed with the patient and all questions fully answered.    Medication List    STOP taking these medications   hydrochlorothiazide 25 MG tablet Commonly known as:  HYDRODIURIL     TAKE these medications   acetaminophen 500 MG tablet Commonly known as:  TYLENOL Take 2 tablets (1,000 mg total) by mouth every 6 (six) hours as needed.   amiodarone 200 MG tablet Commonly known as:  PACERONE Take 1 tablet (200 mg total) by mouth 2 (two) times daily. For 7 Days, then decrease to 200 mg daily   apixaban 5 MG Tabs tablet Commonly known as:  ELIQUIS Take 1 tablet (5 mg total) by mouth 2 (two) times daily.   aspirin EC 81 MG tablet Take 81 mg by mouth daily.   b complex vitamins tablet Take 1 tablet by mouth daily.   calcium carbonate 600 MG Tabs tablet Commonly known as:  OS-CAL Take 600 mg by mouth daily.   Cholecalciferol 1000 units capsule Take 2,000 Units by mouth daily.   Co Q 10 100 MG Caps Take 1 capsule by mouth daily.   cyanocobalamin 1000 MCG tablet Take 1,000 mcg by mouth daily.   diltiazem 300 MG 24 hr capsule Commonly known as:  TIAZAC Take 300 mg by mouth as needed.   EPIPEN 0.3 mg/0.3 mL Soaj injection Generic drug:  EPINEPHrine Inject 0.3 mg into the muscle daily as needed (allergic reaction).   finasteride 5 MG tablet Commonly known as:  PROSCAR Take 5 mg by mouth daily.   FISH OIL PO Take 1,200 mg by mouth daily.   furosemide 40 MG tablet Commonly known as:  LASIX Take 1 tablet (40 mg total) by mouth daily. For 7 days   losartan 25 MG tablet Commonly known as:  COZAAR Take 1 tablet (25 mg total) by mouth daily. What changed:  medication strength  how much to take   MAGNESIA PO Take 1,000 mg by mouth daily.   metoprolol tartrate 25 MG tablet Commonly known as:  LOPRESSOR Take 1 tablet (25 mg total) by mouth 2 (two) times daily.   multivitamin tablet Take 1 tablet by mouth daily.    niacinamide 500 MG tablet Take 500 mg by mouth daily.   oxyCODONE 5 MG immediate release tablet Commonly known as:  Oxy IR/ROXICODONE Take 1-2 tablets (5-10 mg total) by mouth every 3 (three) hours as needed for severe pain. What changed:  how much to take  when to take this  reasons to take this   potassium chloride SA 20 MEQ tablet Commonly known as:  K-DUR,KLOR-CON Take 1 tablet (20 mEq total) by mouth daily. For 7 Days   potassium gluconate 595 (99 K) MG Tabs tablet Take 595 mg by mouth.   rosuvastatin 40 MG tablet Commonly known as:  CRESTOR Take 1 tablet (40 mg total) by mouth every evening.   tamsulosin 0.4 MG Caps capsule Commonly known as:  FLOMAX Take 0.4 mg by mouth daily.   vitamin C 500 MG tablet Commonly known as:  ASCORBIC ACID Take 2,000 mg by mouth daily.       VVS, Skin clean, dry and intact without evidence of skin break down, no evidence of skin tears noted.  IV catheter discontinued intact. Site without signs and symptoms of complications. Dressing and pressure applied.  An After Visit Summary was printed and given to the patient.  Patient escorted via [place], and D/C home via  private auto.  [name]  12/22/2015 10:55 AM

## 2015-12-22 NOTE — Progress Notes (Signed)
CARDIAC REHAB PHASE I   Pt eagerly awaiting discharge, declines ambulation at this time, pt ambulating independently. Cardiac surgery discharge education completed. Reviewed risk factors, IS, sternal precautions, activity progression, exercise, heart healthy diet, daily weights and phase 2 cardiac rehab. Pt verbalized understanding. Pt agrees to phase 2 cardiac rehab referral, will send to [facility] per pt request. Pt in recliner, call bell within reach.   Mer Rouge, RN, BSN 12/22/2015 9:45 AM

## 2015-12-22 NOTE — Progress Notes (Addendum)
      [address]       [place] [address]             [phone]      4 Days Post-Op Procedure(s) (LRB): CORONARY ARTERY BYPASS GRAFTING (CABG) x 3, using left internal mammary artery and bilateral   leg greater saphenous vein harvested endoscopically  LIMA to LAD, SVG to Circumflex, SVG to RCA (N/A) TRANSESOPHAGEAL ECHOCARDIOGRAM (TEE) (N/A)  Subjective:  Mr. Mcgowen has no complaints.  He is ambulating independently, wants to go home  + BM  Objective: Vital signs in last 24 hours: Temp:  [98.1 F (36.7 C)-98.4 F (36.9 C)] 98.4 F (36.9 C) (12/05 0558) Pulse Rate:  [76-88] 76 (12/05 0558) Cardiac Rhythm: Atrial fibrillation;Other (Comment) (12/04 2142) Resp:  [18-20] 18 (12/05 0558) BP: (115-144)/(57-68) 144/64 (12/05 0558) SpO2:  [97 %-98 %] 97 % (12/05 0558) Weight:  [201 lb 4.8 oz (91.3 kg)] 201 lb 4.8 oz (91.3 kg) (12/05 0558)  Intake/Output from previous day: 12/04 0701 - 12/05 0700 In: 240 [P.O.:240] Out: 2225 [Urine:2225]  General appearance: alert, cooperative and no distress Heart: regular rate and rhythm Lungs: clear to auscultation bilaterally Abdomen: soft, non-tender; bowel sounds normal; no masses,  no organomegaly Extremities: edema trace Wound: clean and dry  Lab Results:  Recent Labs  12/20/15 0330 12/21/15 0154  WBC 8.9 10.1  HGB 8.7* 8.4*  HCT 25.5* 24.2*  PLT 135* 164   BMET:  Recent Labs  12/20/15 0330 12/21/15 0154  NA 134* 134*  K 3.6 3.7  CL 102 102  CO2 24 24  GLUCOSE 133* 114*  BUN 13 11  CREATININE 0.90 0.95  CALCIUM 8.3* 8.4*    PT/INR: No results for input(s): LABPROT, INR in the last 72 hours. ABG    Component Value Date/Time   PHART 7.370 12/18/2015 1839   HCO3 21.1 12/18/2015 1839   TCO2 23 12/19/2015 1705   ACIDBASEDEF 4.0 (H) 12/18/2015 1839   O2SAT 98.0 12/18/2015 1839   CBG (last 3)   Recent Labs  12/21/15 1642 12/21/15 2105 12/22/15 0603  GLUCAP 107* 118* 101*     Assessment/Plan: S/P Procedure(s) (LRB): CORONARY ARTERY BYPASS GRAFTING (CABG) x 3, using left internal mammary artery and bilateral   leg greater saphenous vein harvested endoscopically  LIMA to LAD, SVG to Circumflex, SVG to RCA (N/A) TRANSESOPHAGEAL ECHOCARDIOGRAM (TEE) (N/A)  1. CV- H/O PAF, maintaining NSR- continue Amiodarone, Lopressor, Eliquis, BP high at times, will resume home cozaar at reduced dose 2. Pulm- no acute issues, continue IS 3. Renal-weight is trending down, continue Lasix 4. Dispo- patient stable, will d/c home today   LOS: 7 days    BARRETT, ERIN 12/22/2015  Holding sinus Back on eliquis Home today I have seen and examined [name] and agree with the above assessment  and plan.  Grace Isaac MD Beeper [phone] Office [phone] 12/22/2015 9:26 AM

## 2015-12-22 NOTE — Care Management Note (Signed)
Case Management Note Previous CM note initiated by Bethena Roys, RN-12/17/2015, 3:01 PM   Patient Details  Name: [name] MRN: [id number] Date of Birth: Nov 30, 1940  Subjective/Objective:   Pt presented for NStemi. Post cath revealed severe Left Main Disease. Plan for CABG Friday. Pt is from home with wife. No DME at this time.                  Action/Plan: CM will continue to monitor for additional needs.   Expected Discharge Date:     12/22/15             Expected Discharge Plan:  [place]  In-House Referral:     Discharge planning Services  CM Consult  Post Acute Care Choice:  NA Choice offered to:     DME Arranged:    DME Agency:     HH Arranged:    [place]:     Status of Service:  Completed, signed off  If discussed at [employer] of Stay Meetings, dates discussed:  12/5  Discharge Disposition: home/self care   Additional Comments:  12/22/15- 0950- Marvetta Gibbons RN, CM- pt for d/c home today, s/p CABGx3- plan to return home with spouse- no CM needs noted.   Dahlia Client Ashley, RN 12/22/2015, 9:52 AM [phone]

## 2015-12-24 ENCOUNTER — Telehealth: Payer: Self-pay | Admitting: Physician Assistant

## 2015-12-29 DIAGNOSIS — I2510 Atherosclerotic heart disease of native coronary artery without angina pectoris: Secondary | ICD-10-CM | POA: Diagnosis not present

## 2015-12-29 MED FILL — Heparin Sodium (Porcine) Inj 1000 Unit/ML: INTRAMUSCULAR | Qty: 30 | Status: AC

## 2015-12-29 MED FILL — Magnesium Sulfate Inj 50%: INTRAMUSCULAR | Qty: 10 | Status: AC

## 2015-12-29 MED FILL — Potassium Chloride Inj 2 mEq/ML: INTRAVENOUS | Qty: 40 | Status: AC

## 2015-12-30 ENCOUNTER — Encounter: Payer: Self-pay | Admitting: Cardiology

## 2015-12-30 DIAGNOSIS — L570 Actinic keratosis: Secondary | ICD-10-CM | POA: Diagnosis not present

## 2015-12-30 DIAGNOSIS — L821 Other seborrheic keratosis: Secondary | ICD-10-CM | POA: Diagnosis not present

## 2015-12-30 NOTE — Telephone Encounter (Signed)
Error

## 2015-12-31 ENCOUNTER — Ambulatory Visit: Payer: PPO | Admitting: Cardiology

## 2016-01-01 ENCOUNTER — Other Ambulatory Visit: Payer: Self-pay | Admitting: Cardiothoracic Surgery

## 2016-01-01 DIAGNOSIS — Z951 Presence of aortocoronary bypass graft: Secondary | ICD-10-CM

## 2016-01-04 ENCOUNTER — Ambulatory Visit (INDEPENDENT_AMBULATORY_CARE_PROVIDER_SITE_OTHER): Payer: Self-pay | Admitting: Surgical

## 2016-01-04 ENCOUNTER — Ambulatory Visit
Admission: RE | Admit: 2016-01-04 | Discharge: 2016-01-04 | Disposition: A | Discharge status: Home / Self Care | Payer: PPO | Source: Ambulatory Visit | Attending: Cardiothoracic Surgery | Admitting: Cardiothoracic Surgery

## 2016-01-04 VITALS — BP 100/58 | HR 59 | Resp 16 | Ht 73.0 in | Wt 194.0 lb

## 2016-01-04 DIAGNOSIS — J90 Pleural effusion, not elsewhere classified: Secondary | ICD-10-CM | POA: Diagnosis not present

## 2016-01-04 DIAGNOSIS — I2510 Atherosclerotic heart disease of native coronary artery without angina pectoris: Secondary | ICD-10-CM

## 2016-01-04 DIAGNOSIS — Z951 Presence of aortocoronary bypass graft: Secondary | ICD-10-CM

## 2016-01-04 NOTE — Patient Instructions (Addendum)
You may return to driving an automobile as long as you are no longer requiring oral narcotic pain relievers during the daytime.  It would be wise to start driving only short distances during the daylight and gradually increase from there as you feel comfortable.   Make every effort to maintain a "heart-healthy" lifestyle with regular physical exercise and adherence to a low-fat, low-carbohydrate diet.  Continue to seek regular follow-up appointments with your primary care physician and/or cardiologist.  No lifting more than 20 lbs for next 2 weeks

## 2016-01-04 NOTE — Progress Notes (Signed)
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  SURGICAL PROCEDURE:  Coronary artery bypass grafting x3 with left internal mammary to the left anterior descending coronary artery, reverse saphenous vein graft to the circumflex coronary artery, and reverse saphenous vein graft to the distal right coronary artery with bilateral thigh greater saphenous vein harvesting.  SURGEON:  Lanelle Bal, MD.  FIRST ASSISTANT:  Ellwood Handler, PA.   History of Present Illness:    The patient is a 75 year old male status post the above described procedure. He is seen in the office today in routine follow-up. He reports that he is doing quite well. He has no specific complaints. He denies shortness of breath. He denies fevers, chills or other constitutional symptoms. He has had no difficulties with wound healing. He is continuing to use his incentive spirometer with good results. His activity level is continuously improving.    Zubrod Score: At the time of surgery this patient's most appropriate activity status/level should be described as: []     0    Normal activity, no symptoms []     1    Restricted in physical strenuous activity but ambulatory, able to do out light work []     2    Ambulatory and capable of self care, unable to do work activities, up and about                 >50 % of waking hours                                                                                    []     3    Only limited self care, in bed greater than 50% of waking hours []     4    Completely disabled, no self care, confined to bed or chair []     5    Moribund  History  Smoking Status  . Former Smoker  . Years: 30.00  . Types: Cigarettes  . Quit date: 01/18/1978  Smokeless Tobacco  . Never Used       Allergies  Allergen Reactions  . Wasp Venom Shortness Of Breath and Swelling    Current Outpatient Prescriptions  Medication Sig Dispense Refill  . acetaminophen (TYLENOL) 500 MG tablet Take 2 tablets (1,000 mg total) by mouth every 6 (six) hours as needed. 30 tablet 0  . amiodarone (PACERONE) 200 MG tablet  Take 1 tablet (200 mg total) by mouth 2 (two) times daily. For 7 Days, then decrease to 200 mg daily 60 tablet 0  . apixaban (ELIQUIS) 5 MG TABS tablet Take 1 tablet (5 mg total) by mouth 2 (two) times daily. 60 tablet 6  . aspirin EC 81 MG tablet Take 81 mg by mouth daily.    [name] b complex vitamins tablet Take 1 tablet by mouth daily.      . calcium carbonate (OS-CAL) 600 MG TABS Take 600 mg by mouth daily.      . Cholecalciferol 1000 UNITS capsule Take 2,000 Units by mouth daily.     . Coenzyme Q10 (CO Q 10) 100 MG CAPS Take 1 capsule by mouth daily.    . cyanocobalamin 1000 MCG tablet Take 1,000 mcg by mouth daily.    [name] diltiazem (TIAZAC) 300 MG 24 hr capsule Take 300 mg by mouth as needed.    [name] EPINEPHrine (EPIPEN) 0.3 mg/0.3 mL SOAJ injection Inject 0.3 mg into the muscle daily as needed (allergic reaction).     . finasteride (PROSCAR) 5 MG tablet Take 5 mg by mouth daily.    . hydrochlorothiazide (HYDRODIURIL) 25 MG tablet Take 25 mg by mouth daily.    [name] losartan (COZAAR) 25 MG tablet Take 1 tablet (25 mg total) by mouth daily. 30 tablet 3  . Magnesium Hydroxide (MAGNESIA PO) Take 1,000 mg by mouth daily.     . metoprolol tartrate (LOPRESSOR) 25 MG tablet Take 1 tablet (25 mg total) by mouth 2 (two) times daily. 60 tablet 3  . Multiple Vitamin  (MULTIVITAMIN) tablet Take 1 tablet by mouth daily.      . niacinamide 500 MG tablet Take 500 mg by mouth daily.    . Omega-3 Fatty Acids (FISH OIL PO) Take 1,200 mg by mouth daily.    . potassium gluconate 595 (99 K) MG TABS tablet Take 595 mg by mouth.    . rosuvastatin (CRESTOR) 40 MG tablet Take 1 tablet (40 mg total) by mouth every evening. 90 tablet 3  . Tamsulosin HCl (FLOMAX) 0.4 MG CAPS Take 0.4 mg by mouth daily.      . vitamin C (ASCORBIC ACID) 500 MG tablet Take 2,000 mg by mouth daily.     No current facility-administered medications for this visit.        Physical Exam: BP (!) 100/58 (BP Location: Left Arm, Patient Position: Sitting, Cuff Size: Large)   Pulse (!) 59   Resp 16   Ht 6\' 1"  (1.854 m)   Wt 194 lb (88 kg)   SpO2 99% Comment: ON RA  BMI 25.60 kg/m   General appearance: alert, cooperative and no distress Heart: regular rate and rhythm and S1, S2 normal Lungs: clear to auscultation bilaterally Abdomen: benign exam Extremities: Trace lower extremity edema Wounds: Incisions healing well without evidence of infection.  Diagnostic Studies & Laboratory data:         Recent Radiology Findings: Dg Chest 2 View  Result Date: 01/04/2016 CLINICAL DATA:  Status post CABG on December 18, 2015. No current complaints. EXAM: CHEST  2 VIEW COMPARISON:  Portable chest x-ray of December 20, 2015 FINDINGS: There remains a small left-sided pleural effusion. There is no pneumothorax. There is minimal residual atelectasis in the left lower lobe. The right lung is clear. The heart and pulmonary vascularity are normal. The mediastinum is normal in width. The sternal wires are intact. The retrosternal soft tissues are normal. 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 IMPRESSION: Further interval improvement in the appearance of the chest. Decreased left-sided pleural effusion and left lower  lobe atelectasis. No residual right-sided pleural effusion. No CHF or pneumothorax. Electronically Signed   By: David  Martinique M.D.   On: 01/04/2016 13:07      I have independently reviewed the above radiology findings and reviewed findings  with the patient.  Recent Labs: Lab Results  Component Value Date   WBC 10.1 12/21/2015   HGB 8.4 (L) 12/21/2015   HCT 24.2 (L) 12/21/2015   PLT 164 12/21/2015   GLUCOSE 114 (H) 12/21/2015   CHOL 149 12/16/2015   TRIG 74 12/16/2015   HDL 61 12/16/2015   LDLCALC 73 12/16/2015   ALT 15 (L) 12/17/2015   AST 21 12/17/2015   NA 134 (L) 12/21/2015   K 3.7 12/21/2015   CL 102 12/21/2015   CREATININE 0.95 12/21/2015   BUN 11 12/21/2015   CO2 24 12/21/2015   TSH 1.534 06/11/2014   INR 1.59 12/18/2015   HGBA1C 5.8 (H) 12/18/2015      Assessment / Plan:  The patient is doing remarkably well. There are no current significant ongoing cardiothoracic surgical issues. He does have a small improving left-sided pleural effusion that should not require any intervention. We discussed activity progression including driving. He is scheduled for cardiology follow-up tomorrow. We will see him again on a when necessary basis or as requested.      GOLD,WAYNE E 01/04/2016 2:02 PM

## 2016-01-05 ENCOUNTER — Ambulatory Visit (INDEPENDENT_AMBULATORY_CARE_PROVIDER_SITE_OTHER): Payer: PPO | Admitting: Cardiology

## 2016-01-05 ENCOUNTER — Encounter: Payer: Self-pay | Admitting: Cardiology

## 2016-01-05 VITALS — BP 128/60 | HR 60 | Ht 73.0 in | Wt 184.0 lb

## 2016-01-05 DIAGNOSIS — I2510 Atherosclerotic heart disease of native coronary artery without angina pectoris: Secondary | ICD-10-CM | POA: Diagnosis not present

## 2016-01-05 DIAGNOSIS — I480 Paroxysmal atrial fibrillation: Secondary | ICD-10-CM | POA: Diagnosis not present

## 2016-01-05 MED ORDER — METOPROLOL TARTRATE 25 MG PO TABS: 25.0000 mg | ORAL_TABLET | Freq: Two times a day (BID) | ORAL | 3 refills | Status: DC

## 2016-01-05 MED ORDER — LOSARTAN POTASSIUM 25 MG PO TABS: 25.0000 mg | ORAL_TABLET | Freq: Every day | ORAL | 3 refills | Status: DC

## 2016-01-05 MED ORDER — AMIODARONE HCL 200 MG PO TABS: 200.0000 mg | ORAL_TABLET | Freq: Every day | ORAL | 3 refills | Status: DC

## 2016-01-05 NOTE — Patient Instructions (Signed)
Medication Instructions:  Your physician recommends that you continue on your current medications as directed. Please refer to the Current Medication list given to you today.   Labwork: None   Testing/Procedures: none  Follow-Up: Your physician recommends that you schedule a follow-up appointment in: March with Dr Radford Pax.        If you need a refill on your cardiac medications before your next appointment, please call your pharmacy.

## 2016-01-05 NOTE — Progress Notes (Signed)
01/05/2016 [name]   11-25-1940  ET:[id number]  Primary Physician Henrine Screws, MD Primary Cardiologist: Dr. Radford Pax    Reason for Visit/CC: [facility] f/u for CAD s/p CABG  HPI: [name] is a 75 y/o male, followed by Dr. Radford Pax, who presents to clinic today for post hospital f/u. He has a h/o CAD s/p BMS x 2 placed in 2002. He has a significant PMH of HTN, HLD, PAF on Eliquis (last dose 11/27), bilateral carotid artery disease S/P L CEA performed by Dr. Donnetta Hutching, AAA repair with Aortofemoral bypass also performed by Dr. Donnetta Hutching, and H/O TIA.  He recently presented to the [facility] ED with complaint of left arm pain, which is his anginal equivalent. Subsequent LHC revealed 2 Vessel CAD with LM involvement. His previously placed stents were patent.  It was felt coronary bypass grafting would be indicated and TCTS consult was obtained.  On 12/18/15, he underwent CABG x 3 utilizing LIMA to LAD, SVG to RCA, and SVG to Left Circumflex, per Dr. Servando Snare. He developed post operative Atrial Fibrillation and was treated with Amiodarone, with conversion to NSR. He was loaded with amiodarone and transitioned from 400 mg BID>> down to 200 mg BID x 3 days>> followed by 200 mg daily. Eliquis was resumed. He was continued on Crestor for cholesterol (Lipid panel 12/16/15 showed LDL at 73 mg/dL).   He presents back today for f/u. He reports that he has done well. No recurrent left arm pain. No anterior chest pain nor dyspnea. He is ambulating w/o difficulty. He was seen by TCTS earlier this week and cleared to start cardiac rehab. He reports full medication compliance. He denies any symptoms of breakthrough atrial fibrillation.    Current Meds  Medication Sig  . acetaminophen (TYLENOL) 500 MG tablet Take 2 tablets (1,000 mg total) by mouth every 6 (six) hours as needed.  [name] amiodarone (PACERONE) 200 MG tablet Take 200 mg by mouth daily.  [name] apixaban (ELIQUIS) 5 MG TABS tablet Take 1 tablet (5 mg total)  by mouth 2 (two) times daily.  [name] aspirin EC 81 MG tablet Take 81 mg by mouth daily.  [name] b complex vitamins tablet Take 1 tablet by mouth daily.    . calcium carbonate (OS-CAL) 600 MG TABS Take 600 mg by mouth daily.    . Cholecalciferol 1000 UNITS capsule Take 2,000 Units by mouth daily.   . Coenzyme Q10 (CO Q 10) 100 MG CAPS Take 1 capsule by mouth daily.  . cyanocobalamin 1000 MCG tablet Take 1,000 mcg by mouth daily.  [name] diltiazem (TIAZAC) 300 MG 24 hr capsule Take 300 mg by mouth as needed.  [name] EPINEPHrine (EPIPEN) 0.3 mg/0.3 mL SOAJ injection Inject 0.3 mg into the muscle daily as needed (allergic reaction).   . finasteride (PROSCAR) 5 MG tablet Take 5 mg by mouth daily.  . hydrochlorothiazide (HYDRODIURIL) 25 MG tablet Take 25 mg by mouth daily.  [name] losartan (COZAAR) 25 MG tablet Take 1 tablet (25 mg total) by mouth daily.  . Magnesium Hydroxide (MAGNESIA PO) Take 1,000 mg by mouth daily.   . metoprolol tartrate (LOPRESSOR) 25 MG tablet Take 1 tablet (25 mg total) by mouth 2 (two) times daily.  . Multiple Vitamin (MULTIVITAMIN) tablet Take 1 tablet by mouth daily.    . niacinamide 500 MG tablet Take 500 mg by mouth daily.  . Omega-3 Fatty Acids (FISH OIL PO) Take 1,200 mg by mouth daily.  . potassium gluconate 595 (99 K) MG TABS  tablet Take 595 mg by mouth.  . rosuvastatin (CRESTOR) 40 MG tablet Take 1 tablet (40 mg total) by mouth every evening.  . Tamsulosin HCl (FLOMAX) 0.4 MG CAPS Take 0.4 mg by mouth daily.    . vitamin C (ASCORBIC ACID) 500 MG tablet Take 2,000 mg by mouth daily.  . [DISCONTINUED] amiodarone (PACERONE) 200 MG tablet Take 1 tablet (200 mg total) by mouth 2 (two) times daily. For 7 Days, then decrease to 200 mg daily (Patient taking differently: Take 200 mg by mouth daily. For 7 Days, then decrease to 200 mg daily)   Allergies  Allergen Reactions  . Wasp Venom Shortness Of Breath and Swelling   Past Medical History:  Diagnosis Date  . Arthritis    HANDS AND FEET    . Asymptomatic carotid artery stenosis BILATERAL    PER DOPPLER  01-05-10   RICA  1 - [id number]   LICA  40 - [id number] - followed by Dr. Donnetta Hutching  . CAD (coronary artery disease) 2002--  X2 BM STENTS  . Cataract immature BILATERAL   . Diverticulosis   . 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 History of diverticulosis    Noted on Colonoscopy  . History of inguinal herniorrhaphy   . Hydrocele, left   . Hyperlipidemia   . Hypertension   . Increased intraocular pressure   . Nocturia   . Paroxysmal atrial fibrillation ([place]) 06/30/14   chad2vasc score is at least 4  . Peripheral vascular disease ([place]) FOLLOWED BY DR EARLY-- VISIT NOTE 01-05-10 AND CAROTID / VASCULAR DOPPLER  RESULTS W/ CHART   ACTIVE WALKING PROGRAM  . 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 Retinal detachment   . Stroke [facility])    Family History  Problem Relation Age of Onset  . Heart disease Mother     AAA  Before age 20  . Cancer Mother 1    Colon cancer  . Hyperlipidemia Mother   . Heart failure Father    Past Surgical History:  Procedure Laterality Date  . AAA REPAIR W/ AORTOBIFEMORAL BYPASS  OCT  09811  . ABDOMINAL HERNIA REPAIR  X4  (LAST ONE 1990'S)   inguinal herniorrhaphy  . CARDIAC CATHETERIZATION N/A 12/16/2015   Procedure: Left Heart Cath and Coronary Angiography;  Surgeon: Wellington Hampshire, MD;  Location: [facility];  Service: Cardiovascular;  Laterality: N/A;  . CARPAL TUNNEL RELEASE Right Oct. 2013   Hand  . CORONARY ANGIOPLASTY  2002   PTCA  W/ X2 BM STENTS--   PROXIMA LAD  &  LEFT CIRCUMFLEX TO FIRST OBTUSE MARGINAL BRANCH  . CORONARY ARTERY BYPASS GRAFT N/A 12/18/2015   Procedure: CORONARY ARTERY BYPASS GRAFTING (CABG) x 3, using left internal mammary artery and bilateral   leg greater saphenous vein harvested endoscopically  LIMA to LAD, SVG to Circumflex, SVG to RCA;  Surgeon: Grace Isaac, MD;  Location: [facility];  Service: Open Heart Surgery;   Laterality: N/A;  . ENDARTERECTOMY Left 03/11/2015   Procedure: ENDARTERECTOMY LEFT CAROTID;  Surgeon: Rosetta Posner, MD;  Location: [facility];  Service: Vascular;  Laterality: Left;  . EXPLORATORY LAPAROTOMY W/ ENTEROLYSIS FOR PARTIAL SBO  09-23-2008  . heart stents  02/19/00  . HYDROCELE EXCISION  01/24/2011   Procedure: HYDROCELECTOMY ADULT;  Surgeon: Bernestine Amass, MD;  Location: [facility];  Service: Urology;  Laterality: Left;  . Ingrown Toe Nail Right Jan. 2015  Right Great Toe nail  . intest blockage  09/22/08  . PATCH ANGIOPLASTY Left 03/11/2015   Procedure: PATCH ANGIOPLASTY LEFT CAROTID USING HEMASHIELD PLATINUM FINESSE PATCH;  Surgeon: Rosetta Posner, MD;  Location: [facility];  Service: Vascular;  Laterality: Left;  . PULLEY RELEASE -- RIGHT 5TH FINGER  2009  . RETINAL DETACHMENT SURGERY  2004   BILATERAL  . TEE WITHOUT CARDIOVERSION N/A 12/18/2015   Procedure: TRANSESOPHAGEAL ECHOCARDIOGRAM (TEE);  Surgeon: Grace Isaac, MD;  Location: [facility];  Service: Open Heart Surgery;  Laterality: N/A;  . TOOTH EXTRACTION  Dec 2013   Social History   Social History  . Marital status: Married    Spouse name: N/A  . Number of children: N/A  . Years of education: N/A   Occupational History  . Not on file.   Social History Main Topics  . Smoking status: Former Smoker    Years: 30.00    Types: Cigarettes    Quit date: 01/18/1978  . Smokeless tobacco: Never Used  . Alcohol use 13.2 oz/week    1 Shots of liquor, 21 Standard drinks or equivalent per week     Comment: 2 oz of vodka per day  . Drug use: No  . Sexual activity: Not on file   Other Topics Concern  . Not on file   Social History Narrative   Pt lives in [place] with spouse.  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     Review of Systems: General: negative for chills, fever, night sweats or weight changes.  Cardiovascular: negative for chest pain, dyspnea on exertion, edema, orthopnea,  palpitations, paroxysmal nocturnal dyspnea or shortness of breath Dermatological: negative for rash Respiratory: negative for cough or wheezing Urologic: negative for hematuria Abdominal: negative for nausea, vomiting, diarrhea, bright red blood per rectum, melena, or hematemesis Neurologic: negative for visual changes, syncope, or dizziness All other systems reviewed and are otherwise negative except as noted above.   Physical Exam:  Blood pressure 128/60, pulse 60, height 6\' 1"  (1.854 m), weight 184 lb (83.5 kg).  General appearance: alert, cooperative and no distress Neck: no carotid bruit and no JVD Lungs: clear to auscultation bilaterally Heart: regular rate and rhythm, S1, S2 normal, no murmur, click, rub or gallop Extremities: extremities normal, atraumatic, no cyanosis or edema Pulses: 2+ and symmetric Skin: Skin color, texture, turgor normal. No rashes or lesions Neurologic: Grossly normal  EKG sinus bradycardia. 51 bpm.   ASSESSMENT AND PLAN:   1. CAD: s/p CABG x 3 per Dr. Servando Snare. Stable w/o recurrent angina. Continue medial therapy. He is on ASA and Eliquis + BB, ARB and statin.   2. Post Operative Atrial Fibrillation: sinus brady on EKG. HR 51 bpm. Asymptomatic. Continue low dose amiodarone and Eliquis.   3. HTN: BP is well controlled on current regimen. 128/60. Continue metoprolol and losartan.   4. HLD: LDL 73 mg/dL. Continue Crestor.   5. PVD: bilateral carotid artery disease S/P L CEA performed by Dr. Donnetta Hutching, AAA repair with Aortofemoral bypass also performed by Dr. Donnetta Hutching, and H/O TIA. On ASA, Eliquis and statin. He has f/u with Dr. Donnetta Hutching in March 2018.    PLAN  Stable from a cardiac standpoint. Continue current regimen. Start phase 2 cardiac rehab. F/u wit Dr. Radford Pax in 2-3 months.   Lyda Jester PA-C 01/05/2016 9:19 AM

## 2016-01-21 ENCOUNTER — Telehealth: Payer: Self-pay | Admitting: Cardiology

## 2016-01-21 NOTE — Telephone Encounter (Signed)
Sent message to Cardiac Rehab earlier today for follow-up.  Called patient and confirmed that he spoke with Cardiac Rehab and is now scheduled for sessions. He was grateful for assistance.

## 2016-01-21 NOTE — Telephone Encounter (Signed)
New Message  Pt voiced he is calling due to him having open heart surgery about 30 days ago, no one has called him to setup his cardiac rehab, he thinks he has fell threw the cracks, and would like for nurse or APP to give him a call back.  Please f/u with pt

## 2016-01-22 ENCOUNTER — Telehealth (HOSPITAL_COMMUNITY): Payer: Self-pay | Admitting: Internal Medicine

## 2016-01-22 ENCOUNTER — Encounter (HOSPITAL_COMMUNITY): Payer: PPO

## 2016-01-22 NOTE — Telephone Encounter (Signed)
S/w pt confirming apt for 02/02/16 orientation for Cardiac Rehab and also went over benefits of what he will be paying per Co-pay $15 and that [facility] will be billing him for the sessions.... KJ

## 2016-01-22 NOTE — Telephone Encounter (Signed)
Per Lezlie Octave with [employer], pt is eligible for Cardia Rehab $15 copay, $3400 out of pocket, no deductibles, no accumulations. .... KJ

## 2016-01-25 ENCOUNTER — Encounter (HOSPITAL_COMMUNITY): Payer: PPO

## 2016-01-27 ENCOUNTER — Encounter (HOSPITAL_COMMUNITY): Payer: PPO

## 2016-01-29 ENCOUNTER — Encounter (HOSPITAL_COMMUNITY): Payer: PPO

## 2016-02-01 ENCOUNTER — Encounter (HOSPITAL_COMMUNITY): Payer: PPO

## 2016-02-02 ENCOUNTER — Encounter (HOSPITAL_COMMUNITY)
Admission: RE | Admit: 2016-02-02 | Discharge: 2016-02-02 | Disposition: A | Discharge status: Home / Self Care | Payer: PPO | Source: Ambulatory Visit | Attending: Cardiology | Admitting: Cardiology

## 2016-02-02 ENCOUNTER — Encounter: Payer: Self-pay | Admitting: Cardiology

## 2016-02-02 ENCOUNTER — Encounter (HOSPITAL_COMMUNITY): Payer: Self-pay

## 2016-02-02 ENCOUNTER — Telehealth: Payer: Self-pay

## 2016-02-02 VITALS — BP 98/60 | HR 45 | Ht 71.5 in | Wt 195.5 lb

## 2016-02-02 DIAGNOSIS — Z951 Presence of aortocoronary bypass graft: Secondary | ICD-10-CM | POA: Insufficient documentation

## 2016-02-02 DIAGNOSIS — I214 Non-ST elevation (NSTEMI) myocardial infarction: Secondary | ICD-10-CM | POA: Diagnosis not present

## 2016-02-02 MED ORDER — METOPROLOL TARTRATE 25 MG PO TABS: 12.5000 mg | ORAL_TABLET | Freq: Two times a day (BID) | ORAL | 3 refills | Status: DC

## 2016-02-02 NOTE — Progress Notes (Signed)
Mr [name] resting heart rate noted in the mid 40's with a first degree heart block that has not been previously documented. Dr Theodosia Blender RN [name] called and notified. 12 lead ECG obtained. Mr [name] told the pharmacist that he sometimes experiences dizziness when standing. Sitting blood pressure 112/62. Standing blood pressure 112/64. Mr [name] denies any symptom's of dizziness today. Mr [name] said he remember feeling lightheaded about 3-4 days ago. Exit heart rate 48. Will fax exercise flow sheets to Dr. Theodosia Blender  office for review with the 12 lead ECG. [name] plans to return to exercise on Monday.Barnet Pall, RN,BSN 02/02/2016 10:30 AM

## 2016-02-02 NOTE — Progress Notes (Signed)
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  acetaminophen (TYLENOL) 500 MG tablet, Take 2 tablets (1,000 mg total) by mouth every 6 (six) hours as needed., Disp: 30 tablet, Rfl: 0 .  amiodarone (PACERONE) 200 MG tablet, Take 1 tablet (200 mg total) by mouth daily., Disp: 90 tablet, Rfl: 3 .  apixaban (ELIQUIS) 5 MG TABS tablet, Take 1 tablet (5 mg total) by mouth 2 (two) times daily., Disp: 60 tablet, Rfl: 6 .  aspirin EC 81 MG tablet, Take 81 mg by mouth daily., Disp: , Rfl:  .  b complex vitamins tablet, Take 1 tablet by mouth daily.  , Disp: , Rfl:  .  calcium carbonate (OS-CAL) 600 MG TABS, Take 600 mg by mouth daily.  , Disp: , Rfl:  .  Cholecalciferol 1000 UNITS capsule, Take 2,000 Units by mouth daily. , Disp: , Rfl:  .  Coenzyme Q10 (CO Q 10) 100 MG CAPS, Take 1 capsule by mouth daily., Disp: , Rfl:  .  cyanocobalamin 1000 MCG tablet, Take 1,000 mcg by mouth daily., Disp: , Rfl:  .  EPINEPHrine (EPIPEN) 0.3 mg/0.3 mL SOAJ injection, Inject 0.3 mg into the muscle daily as needed (allergic reaction). , Disp: , Rfl:  .  finasteride (PROSCAR) 5 MG tablet, Take 5 mg by mouth daily., Disp: , Rfl:  .  hydrochlorothiazide (HYDRODIURIL) 25 MG tablet, Take 25 mg by mouth daily., Disp: , Rfl:  .  losartan (COZAAR) 25 MG tablet, Take 1 tablet (25 mg total) by mouth daily.,  Disp: 90 tablet, Rfl: 3 .  Magnesium Hydroxide (MAGNESIA PO), Take 1,000 mg by mouth daily. , Disp: , Rfl:  .  metoprolol tartrate (LOPRESSOR) 25 MG tablet, Take 1 tablet (25 mg total) by mouth 2 (two) times daily., Disp: 180 tablet, Rfl: 3 .  Multiple Vitamin (MULTIVITAMIN) tablet, Take 1 tablet by mouth daily.  , Disp: , Rfl:  .  niacinamide 500 MG tablet, Take 500 mg by mouth daily., Disp: , Rfl:  .  Omega-3 Fatty Acids (FISH OIL PO), Take 1,200 mg by mouth daily., Disp: , Rfl:  .  potassium gluconate 595 (99 K) MG TABS tablet, Take 595 mg by mouth., Disp: , Rfl:  .  rosuvastatin (CRESTOR) 40 MG tablet, Take 1 tablet (40 mg total) by mouth every evening., Disp: 90 tablet, Rfl: 3 .  Tamsulosin HCl (FLOMAX) 0.4 MG CAPS, Take 0.4 mg by mouth daily.  , Disp: , Rfl:  .  vitamin C (ASCORBIC ACID) 500 MG tablet, Take 2,000 mg by mouth daily., Disp: , Rfl:  .  diltiazem (TIAZAC) 300 MG 24 hr capsule, Take 300 mg by mouth as needed., Disp: , Rfl:   Past Medical History: Past Medical History:  Diagnosis Date  . Arthritis    HANDS AND FEET  . Asymptomatic carotid artery stenosis BILATERAL    PER DOPPLER  01-05-10  RICA  1 - [id number]   LICA  40 - [id number] - followed by Dr. Donnetta Hutching  . CAD (coronary artery disease) 2002--  X2 BM STENTS  . Cataract immature BILATERAL   . Diverticulosis   . 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 History of diverticulosis    Noted on Colonoscopy  . History of inguinal herniorrhaphy   . Hydrocele, left   . Hyperlipidemia   . Hypertension   . Increased intraocular pressure   . Nocturia   . Paroxysmal atrial fibrillation ([place]) 06/30/14   chad2vasc score is at least 4  . Peripheral vascular disease ([place]) FOLLOWED BY DR EARLY-- VISIT NOTE 01-05-10 AND CAROTID / VASCULAR DOPPLER  RESULTS W/ CHART   ACTIVE WALKING PROGRAM  . 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 Retinal detachment   . Stroke [facility])     Tobacco Use: History   Smoking Status  . Former Smoker  . Years: 30.00  . Types: Cigarettes  . Quit date: 01/18/1978  Smokeless Tobacco  . Former Systems developer  . Quit date: 1980    Labs: Recent Review Flowsheet Data    Labs for ITP Cardiac and Pulmonary Rehab Latest Ref Rng & Units 12/18/2015 12/18/2015 12/18/2015 12/18/2015 12/19/2015   Cholestrol 0 - 200 mg/dL - - - - -   LDLCALC 0 - 99 mg/dL - - - - -   HDL >40 mg/dL - - - - -   Trlycerides <150 mg/dL - - - - -   Hemoglobin A1c 4.8 - 5.6 % - - - - -   PHART 7.350 - 7.450 7.460(H) 7.400 7.370 - -   PCO2ART 32.0 - 48.0 mmHg 33.7 35.9 36.4 - -   HCO3 20.0 - 28.0 mmol/L 24.3 22.3 21.1 - -   TCO2 0 - 100 mmol/L 25 23 22 23 23    ACIDBASEDEF 0.0 - 2.0 mmol/L - 2.0 4.0(H) - -   O2SAT % 99.0 98.0 98.0 - -      Capillary Blood Glucose: Lab Results  Component Value Date   GLUCAP 101 (H) 12/22/2015   GLUCAP 118 (H) 12/21/2015   GLUCAP 107 (H) 12/21/2015   GLUCAP 90 12/21/2015   GLUCAP 102 (H) 12/21/2015     Exercise Target Goals: Date: 02/02/16  Exercise Program Goal: Individual exercise prescription set with THRR, safety & activity barriers. Participant demonstrates ability to understand and report RPE using BORG scale, to self-measure pulse accurately, and to acknowledge the importance of the exercise prescription.  Exercise Prescription Goal: Starting with aerobic activity 30 plus minutes a day, 3 days per week for initial exercise prescription. Provide home exercise prescription and guidelines that participant acknowledges understanding prior to discharge.  Activity Barriers & Risk Stratification:     Activity Barriers & Cardiac Risk Stratification - 02/02/16 0820      Activity Barriers & Cardiac Risk Stratification   Activity Barriers None   Cardiac Risk Stratification High      6 Minute Walk:     6 Minute Walk    Row Name 02/02/16 1207         6 Minute Walk   Phase Initial     Distance 1596 feet     Walk Time 6 minutes     # of Rest  Breaks 1  rest break 17 seconds     MPH 3.34     METS 2.96     RPE 13     Perceived  Dyspnea  0     VO2 Peak 10.37     Symptoms Yes (comment)     Comments claudication pain- leg burning      Resting HR 45 bpm     Resting BP 98/60     Max Ex. HR 75 bpm     Max Ex. BP 142/70     2 Minute Post BP 114/60        Initial Exercise Prescription:     Initial Exercise Prescription - 02/02/16 1200      Date of Initial Exercise RX and Referring Provider   Date 02/02/16   Referring Provider Fransico Him MD     Treadmill   MPH 2.5   Grade 0   Minutes 10   METs 2.91     Bike   Level 0.5   Minutes 10   METs 2.05     NuStep   Level 3   Minutes 10   METs 2     Prescription Details   Frequency (times per week) 3   Duration Progress to 30 minutes of continuous aerobic without signs/symptoms of physical distress     Intensity   THRR 40-80% of Max Heartrate 57-114   Ratings of Perceived Exertion 11-13   Perceived Dyspnea 0-4     Progression   Progression Continue progressive overload as per policy without signs/symptoms or physical distress.     Resistance Training   Training Prescription Yes   Weight 2lbs   Reps 10-12      Perform Capillary Blood Glucose checks as needed.  Exercise Prescription Changes:   Exercise Comments:   Discharge Exercise Prescription (Final Exercise Prescription Changes):   Nutrition:  Target Goals: Understanding of nutrition guidelines, daily intake of sodium 1500mg , cholesterol 200mg , calories 30% from fat and 7% or less from saturated fats, daily to have 5 or more servings of fruits and vegetables.  Biometrics:     Pre Biometrics - 02/02/16 1214      Pre Biometrics   Weight 195 lb 8.8 oz (88.7 kg)   Waist Circumference 38 inches   Hip Circumference 41 inches   Waist to Hip Ratio 0.93 %   Triceps Skinfold 8 mm   % Body Fat 23.7 %   Grip Strength 45 kg   Flexibility 7.5 in   Single Leg Stand 2.21 seconds        Nutrition Therapy Plan and Nutrition Goals:   Nutrition Discharge: Nutrition Scores:   Nutrition Goals Re-Evaluation:   Psychosocial: Target Goals: Acknowledge presence or absence of depression, maximize coping skills, provide positive support system. Participant is able to verbalize types and ability to use techniques and skills needed for reducing stress and depression.  Initial Review & Psychosocial Screening:     Initial Psych Review & Screening - 02/02/16 1150      Family Dynamics   Good Support System? Yes   Comments Brief assessment reveals no idetifiable barriers or interventions needed at this time     Barriers   Psychosocial barriers to participate in program There are no identifiable barriers or psychosocial needs.     Screening Interventions   Interventions Encouraged to exercise      Quality of Life Scores:     Quality of Life - 02/02/16 1216      Quality of Life Scores   Health/Function Pre 26.27 %   Socioeconomic Pre 26.67 %   Psych/Spiritual Pre 28.57 %   Family Pre 28.8 %   GLOBAL Pre  27.21 %      PHQ-9: Recent Review Flowsheet Data    There is no flowsheet data to display.      Psychosocial Evaluation and Intervention:   Psychosocial Re-Evaluation:   Vocational Rehabilitation: Provide vocational rehab assistance to qualifying candidates.   Vocational Rehab Evaluation & Intervention:     Vocational Rehab - 02/02/16 1150      Initial Vocational Rehab Evaluation & Intervention   Assessment shows need for Vocational Rehabilitation No  Mr Garno is retired      Education: Education Goals: Education classes will be provided on a weekly basis, covering required topics. Participant will state understanding/return demonstration of topics presented.  Learning Barriers/Preferences:     Learning Barriers/Preferences - 02/02/16 0820      Learning Barriers/Preferences   Learning Barriers Sight   Learning Preferences Written  Material;Computer/Internet      Education Topics: Count Your Pulse:  -Group instruction provided by verbal instruction, demonstration, patient participation and written materials to support subject.  Instructors address importance of being able to find your pulse and how to count your pulse when at home without a heart monitor.  Patients get hands on experience counting their pulse with staff help and individually.   Heart Attack, Angina, and Risk Factor Modification:  -Group instruction provided by verbal instruction, video, and written materials to support subject.  Instructors address signs and symptoms of angina and heart attacks.    Also discuss risk factors for heart disease and how to make changes to improve heart health risk factors.   Functional Fitness:  -Group instruction provided by verbal instruction, demonstration, patient participation, and written materials to support subject.  Instructors address safety measures for doing things around the house.  Discuss how to get up and down off the floor, how to pick things up properly, how to safely get out of a chair without assistance, and balance training.   Meditation and Mindfulness:  -Group instruction provided by verbal instruction, patient participation, and written materials to support subject.  Instructor addresses importance of mindfulness and meditation practice to help reduce stress and improve awareness.  Instructor also leads participants through a meditation exercise.    Stretching for Flexibility and Mobility:  -Group instruction provided by verbal instruction, patient participation, and written materials to support subject.  Instructors lead participants through series of stretches that are designed to increase flexibility thus improving mobility.  These stretches are additional exercise for major muscle groups that are typically performed during regular warm up and cool down.   Hands Only CPR Anytime:  -Group  instruction provided by verbal instruction, video, patient participation and written materials to support subject.  Instructors co-teach with AHA video for hands only CPR.  Participants get hands on experience with mannequins.   Nutrition I class: Heart Healthy Eating:  -Group instruction provided by PowerPoint slides, verbal discussion, and written materials to support subject matter. The instructor gives an explanation and review of the Therapeutic Lifestyle Changes diet recommendations, which includes a discussion on lipid goals, dietary fat, sodium, fiber, plant stanol/sterol esters, sugar, and the components of a well-balanced, healthy diet.   Nutrition II class: Lifestyle Skills:  -Group instruction provided by PowerPoint slides, verbal discussion, and written materials to support subject matter. The instructor gives an explanation and review of label reading, grocery shopping for heart health, heart healthy recipe modifications, and ways to make healthier choices when eating out.   Diabetes Question & Answer:  -Group instruction provided by PowerPoint slides, verbal discussion,  and written materials to support subject matter. The instructor gives an explanation and review of diabetes co-morbidities, pre- and post-prandial blood glucose goals, pre-exercise blood glucose goals, signs, symptoms, and treatment of hypoglycemia and hyperglycemia, and foot care basics.   Diabetes Blitz:  -Group instruction provided by PowerPoint slides, verbal discussion, and written materials to support subject matter. The instructor gives an explanation and review of the physiology behind type 1 and type 2 diabetes, diabetes medications and rational behind using different medications, pre- and post-prandial blood glucose recommendations and Hemoglobin A1c goals, diabetes diet, and exercise including blood glucose guidelines for exercising safely.    Portion Distortion:  -Group instruction provided by PowerPoint  slides, verbal discussion, written materials, and food models to support subject matter. The instructor gives an explanation of serving size versus portion size, changes in portions sizes over the last 20 years, and what consists of a serving from each food group.   Stress Management:  -Group instruction provided by verbal instruction, video, and written materials to support subject matter.  Instructors review role of stress in heart disease and how to cope with stress positively.     Exercising on Your Own:  -Group instruction provided by verbal instruction, power point, and written materials to support subject.  Instructors discuss benefits of exercise, components of exercise, frequency and intensity of exercise, and end points for exercise.  Also discuss use of nitroglycerin and activating EMS.  Review options of places to exercise outside of rehab.  Review guidelines for sex with heart disease.   Cardiac Drugs I:  -Group instruction provided by verbal instruction and written materials to support subject.  Instructor reviews cardiac drug classes: antiplatelets, anticoagulants, beta blockers, and statins.  Instructor discusses reasons, side effects, and lifestyle considerations for each drug class.   Cardiac Drugs II:  -Group instruction provided by verbal instruction and written materials to support subject.  Instructor reviews cardiac drug classes: angiotensin converting enzyme inhibitors (ACE-I), angiotensin II receptor blockers (ARBs), nitrates, and calcium channel blockers.  Instructor discusses reasons, side effects, and lifestyle considerations for each drug class.   Anatomy and Physiology of the Circulatory System:  -Group instruction provided by verbal instruction, video, and written materials to support subject.  Reviews functional anatomy of heart, how it relates to various diagnoses, and what role the heart plays in the overall system.   Knowledge Questionnaire Score:   Core  Components/Risk Factors/Patient Goals at Admission:     Personal Goals and Risk Factors at Admission - 02/02/16 1429      Core Components/Risk Factors/Patient Goals on Admission    Weight Management Yes;Weight Loss   Intervention Weight Management: Develop a combined nutrition and exercise program designed to reach desired caloric intake, while maintaining appropriate intake of nutrient and fiber, sodium and fats, and appropriate energy expenditure required for the weight goal.;Weight Management: Provide education and appropriate resources to help participant work on and attain dietary goals.;Obesity: Provide education and appropriate resources to help participant work on and attain dietary goals.   Expected Outcomes Short Term: Continue to assess and modify interventions until short term weight is achieved;Long Term: Adherence to nutrition and physical activity/exercise program aimed toward attainment of established weight goal;Weight Maintenance: Understanding of the daily nutrition guidelines, which includes 25-35% calories from fat, 7% or less cal from saturated fats, less than 200mg  cholesterol, less than 1.5gm of sodium, & 5 or more servings of fruits and vegetables daily;Weight Loss: Understanding of general recommendations for a balanced deficit meal plan, which  promotes 1-2 lb weight loss per week and includes a negative energy balance of [phone] kcal/d   Sedentary Yes   Intervention Provide advice, education, support and counseling about physical activity/exercise needs.;Develop an individualized exercise prescription for aerobic and resistive training based on initial evaluation findings, risk stratification, comorbidities and participant's personal goals.   Expected Outcomes Achievement of increased cardiorespiratory fitness and enhanced flexibility, muscular endurance and strength shown through measurements of functional capacity and personal statement of participant.   Increase Strength  and Stamina Yes   Intervention Provide advice, education, support and counseling about physical activity/exercise needs.;Develop an individualized exercise prescription for aerobic and resistive training based on initial evaluation findings, risk stratification, comorbidities and participant's personal goals.   Expected Outcomes Achievement of increased cardiorespiratory fitness and enhanced flexibility, muscular endurance and strength shown through measurements of functional capacity and personal statement of participant.   Hypertension Yes   Intervention Provide education on lifestyle modifcations including regular physical activity/exercise, weight management, moderate sodium restriction and increased consumption of fresh fruit, vegetables, and low fat dairy, alcohol moderation, and smoking cessation.;Monitor prescription use compliance.   Expected Outcomes Short Term: Continued assessment and intervention until BP is < 140/39mm HG in hypertensive participants. < 130/65mm HG in hypertensive participants with diabetes, heart failure or chronic kidney disease.;Long Term: Maintenance of blood pressure at goal levels.   Lipids Yes   Intervention Provide education and support for participant on nutrition & aerobic/resistive exercise along with prescribed medications to achieve LDL 70mg , HDL >40mg .   Expected Outcomes Short Term: Participant states understanding of desired cholesterol values and is compliant with medications prescribed. Participant is following exercise prescription and nutrition guidelines.;Long Term: Cholesterol controlled with medications as prescribed, with individualized exercise RX and with personalized nutrition plan. Value goals: LDL < 70mg , HDL > 40 mg.   Personal Goal Other Yes   Personal Goal Increase stamina and flexibility, and be able to play golf   Intervention Provide exercise programming and provide education on stretching, ROM and functional mobility to assist with  improving functional mobility/flexibility.    Expected Outcomes Pt will attend cardiac education classes and participate in active and passive streching. Pt will also be able to return to golf without diffculty.      Core Components/Risk Factors/Patient Goals Review:    Core Components/Risk Factors/Patient Goals at Discharge (Final Review):    ITP Comments:     ITP Comments    Row Name 02/02/16 0818           ITP Comments Dr. Fransico Him, Medical Director          Comments: Patient attended orientation from 0800 to 1030 to review rules and guidelines for program. Completed 6 minute walk test, Intitial ITP, and exercise prescription. . Telemetry-Sinus brady with a first degree heart block.This was not previously noted on the office 12 lead ECG. Entry blood pressure 98/60. Patient asymptomatic. Mr Scherman stopped his walk test at 5 minutes and 43 seconds due to claudication. Danna Hefty NP called and notified. No new order received. Repeat blood pressure 142/70 at the end of the walk test. Maximum heart rate noted at 77. Will forward today's ECG tracings to Dr Theodosia Blender office for review and notify Dr Theodosia Blender RN Lenice Llamas about today's bradycardia.Barnet Pall, RN,BSN 02/02/2016 2:48 PM

## 2016-02-02 NOTE — Telephone Encounter (Signed)
Per Dr. Radford Pax, instructed patient to DECREASE METOPROLOL to 12.5 mg BID. He agrees with treatment plan.

## 2016-02-02 NOTE — Progress Notes (Signed)
Cardiac Rehab Medication Review by a Pharmacist  Does the patient  feel that his/her medications are working for him/her?  yes  Has the patient been experiencing any side effects to the medications prescribed?  no  Does the patient measure his/her own blood pressure or blood glucose at home?  yes   Does the patient have any problems obtaining medications due to transportation or finances?   no  Understanding of regimen: good Understanding of indications: good Potential of compliance: good   Pharmacist comments: 47 yoM presents to cardiac rehab in good spirits without assistance. He complains today of becoming dizzy while standing (not described as orthostatic - can change positions without symptoms). We discussed keeping a log with BP and HR readings during these times, along with activities. He has also been started on 3 new medications (amiodarone, losartan, and metoprolol) since his surgery that can cause dizziness if the doses are inappropriate for him. He voiced understanding and approval of his medication regimen.  After he left the room, the RN told me that he was bradycardic in clinic today. I described his dizziness to her, and she has taken action on this information.    Belia Heman, PharmD PGY1 Pharmacy Resident [phone] (Pager) 02/02/2016 8:58 AM

## 2016-02-02 NOTE — Telephone Encounter (Signed)
-----   Message from Magda Kiel, RN sent at 02/02/2016 10:39 AM EST ----- Regarding: bradycardia Good morning [name]  Thanks so much for your help this morning. The 12 lead has been completed. I faxed over the paper copies as well. The documentation has been completed in a progress note.  Thanks for your help!  [name] was asymptomatic except for complaints of claudication which is a chronic condition for him  Please call me if you need any more documentation  Thanks to you and Dr Radford Pax  Have a great day!  Verdis Frederickson

## 2016-02-03 ENCOUNTER — Encounter (HOSPITAL_COMMUNITY): Payer: PPO

## 2016-02-05 ENCOUNTER — Encounter (HOSPITAL_COMMUNITY): Payer: PPO

## 2016-02-08 ENCOUNTER — Encounter (HOSPITAL_COMMUNITY): Payer: PPO

## 2016-02-08 ENCOUNTER — Encounter (HOSPITAL_COMMUNITY)
Admission: RE | Admit: 2016-02-08 | Discharge: 2016-02-08 | Disposition: A | Discharge status: Home / Self Care | Payer: PPO | Source: Ambulatory Visit | Attending: Cardiology | Admitting: Cardiology

## 2016-02-08 DIAGNOSIS — I214 Non-ST elevation (NSTEMI) myocardial infarction: Secondary | ICD-10-CM

## 2016-02-08 DIAGNOSIS — Z951 Presence of aortocoronary bypass graft: Secondary | ICD-10-CM

## 2016-02-08 NOTE — Progress Notes (Signed)
Daily Session Note  Patient Details  Name: [name] MRN: [id number] Date of Birth: 11/21/1940 Referring Provider:   Flowsheet Row CARDIAC REHAB PHASE II ORIENTATION from 02/02/2016 in [facility] CARD[facility]  Referring Provider  Turner, [name] MD      Encounter Date: 02/08/2016  Check In:   Capillary Blood Glucose: No results found for this or any previous visit (from the past 24 hour(s)).   Goals Met:  Exercise tolerated well  Goals Unmet:  Not Applicable  Comments: [name] started cardiac rehab today.  Pt tolerated light exercise without difficulty. VSS, telemetry-Sinus rhythm sinus brady.[name]'s resting heart rate was noted in the mid 50's to low 60's since metoprolol was decreased, asymptomatic.  Medication list reconciled. Pt denies barriers to medicaiton compliance.  PSYCHOSOCIAL ASSESSMENT:  PHQ-0. Pt exhibits positive coping skills, hopeful outlook with supportive family. No psychosocial needs identified at this time, no psychosocial interventions necessary.    Pt enjoys playing golf, fishing and doing yardwork.   Pt oriented to exercise equipment and routine.    Understanding verbalized.Maria Whitaker, RN,BSN 02/08/2016 3:27 PM   Dr. [name] Turner is Medical Director for Cardiac Rehab at [facility]. 

## 2016-02-10 ENCOUNTER — Encounter (HOSPITAL_COMMUNITY)
Admission: RE | Admit: 2016-02-10 | Discharge: 2016-02-10 | Disposition: A | Discharge status: Home / Self Care | Payer: PPO | Source: Ambulatory Visit | Attending: Cardiology | Admitting: Cardiology

## 2016-02-10 ENCOUNTER — Encounter (HOSPITAL_COMMUNITY): Payer: PPO

## 2016-02-10 DIAGNOSIS — I214 Non-ST elevation (NSTEMI) myocardial infarction: Secondary | ICD-10-CM

## 2016-02-10 DIAGNOSIS — Z951 Presence of aortocoronary bypass graft: Secondary | ICD-10-CM

## 2016-02-12 ENCOUNTER — Encounter (HOSPITAL_COMMUNITY): Payer: PPO

## 2016-02-12 ENCOUNTER — Encounter (HOSPITAL_COMMUNITY)
Admission: RE | Admit: 2016-02-12 | Discharge: 2016-02-12 | Disposition: A | Discharge status: Home / Self Care | Payer: PPO | Source: Ambulatory Visit | Attending: Cardiology | Admitting: Cardiology

## 2016-02-12 DIAGNOSIS — Z951 Presence of aortocoronary bypass graft: Secondary | ICD-10-CM

## 2016-02-12 DIAGNOSIS — I214 Non-ST elevation (NSTEMI) myocardial infarction: Secondary | ICD-10-CM | POA: Diagnosis not present

## 2016-02-15 ENCOUNTER — Encounter (HOSPITAL_COMMUNITY): Payer: PPO

## 2016-02-15 ENCOUNTER — Encounter (HOSPITAL_COMMUNITY)
Admission: RE | Admit: 2016-02-15 | Discharge: 2016-02-15 | Disposition: A | Discharge status: Home / Self Care | Payer: PPO | Source: Ambulatory Visit | Attending: Cardiology | Admitting: Cardiology

## 2016-02-15 DIAGNOSIS — I214 Non-ST elevation (NSTEMI) myocardial infarction: Secondary | ICD-10-CM | POA: Diagnosis not present

## 2016-02-15 DIAGNOSIS — Z951 Presence of aortocoronary bypass graft: Secondary | ICD-10-CM

## 2016-02-15 NOTE — Progress Notes (Signed)
Reviewed home exercise program with pt.  Discussed mode/frequency of exercise, THRR, RPE scale and weather conditions for exercising outdoors.  Pt states he is already doing some walking at home on his off days from [facility] (15-20 min/day).  I encouraged him to continue and add 1-3 minutes each day until he is exercising continuously for 30-45 minutes without any symptoms.  We also discussed signs and symptoms and when to call Dr/911.  Pt verbalized understanding.  Cleda Mccreedy, [place] 02/15/2016 1159

## 2016-02-15 NOTE — Progress Notes (Signed)
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  acetaminophen (TYLENOL) 500 MG tablet, Take 2 tablets (1,000 mg total) by mouth every 6 (six) hours as needed., Disp: 30 tablet, Rfl: 0 .  amiodarone (PACERONE) 200 MG tablet, Take 1 tablet (200 mg total) by mouth daily., Disp: 90 tablet, Rfl: 3 .  apixaban (ELIQUIS) 5 MG TABS tablet, Take 1 tablet (5 mg total) by mouth 2 (two) times daily., Disp: 60 tablet, Rfl: 6 .  aspirin EC 81 MG tablet, Take 81 mg by mouth daily., Disp: , Rfl:  .  b complex vitamins tablet, Take 1 tablet by mouth daily.  , Disp: , Rfl:  .  calcium carbonate (OS-CAL) 600 MG TABS, Take 600 mg by mouth daily.  , Disp: , Rfl:  .  Cholecalciferol 1000 UNITS capsule, Take 2,000 Units by mouth daily. , Disp: , Rfl:  .  Coenzyme Q10 (CO Q 10) 100 MG CAPS, Take 1 capsule by mouth daily., Disp: , Rfl:  .  cyanocobalamin 1000 MCG tablet, Take 1,000 mcg by mouth daily., Disp: , Rfl:  .  diltiazem (TIAZAC) 300 MG 24 hr capsule, Take 300 mg by mouth as needed., Disp: , Rfl:  .  EPINEPHrine (EPIPEN) 0.3 mg/0.3 mL SOAJ injection, Inject 0.3 mg into the muscle daily as needed (allergic reaction). , Disp: , Rfl:  .  finasteride (PROSCAR) 5 MG tablet, Take 5 mg by mouth daily., Disp: , Rfl:  .  hydrochlorothiazide (HYDRODIURIL) 25 MG tablet, Take 25 mg by mouth daily.,  Disp: , Rfl:  .  losartan (COZAAR) 25 MG tablet, Take 1 tablet (25 mg total) by mouth daily., Disp: 90 tablet, Rfl: 3 .  Magnesium Hydroxide (MAGNESIA PO), Take 1,000 mg by mouth daily. , Disp: , Rfl:  .  metoprolol tartrate (LOPRESSOR) 25 MG tablet, Take 0.5 tablets (12.5 mg total) by mouth 2 (two) times daily., Disp: 90 tablet, Rfl: 3 .  Multiple Vitamin (MULTIVITAMIN) tablet, Take 1 tablet by mouth daily.  , Disp: , Rfl:  .  niacinamide 500 MG tablet, Take 500 mg by mouth daily., Disp: , Rfl:  .  Omega-3 Fatty Acids (FISH OIL PO), Take 1,200 mg by mouth daily., Disp: , Rfl:  .  potassium gluconate 595 (99 K) MG TABS tablet, Take 595 mg by mouth., Disp: , Rfl:  .  rosuvastatin (CRESTOR) 40 MG tablet, Take 1 tablet (40 mg total) by mouth every evening., Disp: 90 tablet, Rfl: 3 .  Tamsulosin HCl (FLOMAX) 0.4 MG CAPS, Take 0.4 mg by mouth daily.  , Disp: , Rfl:  .  vitamin C (ASCORBIC ACID) 500 MG tablet, Take 2,000 mg by mouth daily., Disp: , Rfl:   Past Medical History: Past Medical History:  Diagnosis Date  . Arthritis    HANDS AND FEET  . Asymptomatic carotid artery stenosis BILATERAL    PER DOPPLER  01-05-10  RICA  1 - [id number]   LICA  40 - [id number] - followed by Dr. Donnetta Hutching  . CAD (coronary artery disease) 2002--  X2 BM STENTS  . Cataract immature BILATERAL   . Diverticulosis   . 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 History of diverticulosis    Noted on Colonoscopy  . History of inguinal herniorrhaphy   . Hydrocele, left   . Hyperlipidemia   . Hypertension   . Increased intraocular pressure   . Nocturia   . Paroxysmal atrial fibrillation ([place]) 06/30/14   chad2vasc score is at least 4  . Peripheral vascular disease ([place]) FOLLOWED BY DR EARLY-- VISIT NOTE 01-05-10 AND CAROTID / VASCULAR DOPPLER  RESULTS W/ CHART   ACTIVE WALKING PROGRAM  . 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 Retinal detachment   . Stroke [facility])     Tobacco  Use: History  Smoking Status  . Former Smoker  . Years: 30.00  . Types: Cigarettes  . Quit date: 01/18/1978  Smokeless Tobacco  . Former Systems developer  . Quit date: 1980    Labs: Recent Review Flowsheet Data    Labs for ITP Cardiac and Pulmonary Rehab Latest Ref Rng & Units 12/18/2015 12/18/2015 12/18/2015 12/18/2015 12/19/2015   Cholestrol 0 - 200 mg/dL - - - - -   LDLCALC 0 - 99 mg/dL - - - - -   HDL >40 mg/dL - - - - -   Trlycerides <150 mg/dL - - - - -   Hemoglobin A1c 4.8 - 5.6 % - - - - -   PHART 7.350 - 7.450 7.460(H) 7.400 7.370 - -   PCO2ART 32.0 - 48.0 mmHg 33.7 35.9 36.4 - -   HCO3 20.0 - 28.0 mmol/L 24.3 22.3 21.1 - -   TCO2 0 - 100 mmol/L 25 23 22 23 23    ACIDBASEDEF 0.0 - 2.0 mmol/L - 2.0 4.0(H) - -   O2SAT % 99.0 98.0 98.0 - -      Capillary Blood Glucose: Lab Results  Component Value Date   GLUCAP 101 (H) 12/22/2015   GLUCAP 118 (H) 12/21/2015   GLUCAP 107 (H) 12/21/2015   GLUCAP 90 12/21/2015   GLUCAP 102 (H) 12/21/2015     Exercise Target Goals:    Exercise Program Goal: Individual exercise prescription set with THRR, safety & activity barriers. Participant demonstrates ability to understand and report RPE using BORG scale, to self-measure pulse accurately, and to acknowledge the importance of the exercise prescription.  Exercise Prescription Goal: Starting with aerobic activity 30 plus minutes a day, 3 days per week for initial exercise prescription. Provide home exercise prescription and guidelines that participant acknowledges understanding prior to discharge.  Activity Barriers & Risk Stratification:     Activity Barriers & Cardiac Risk Stratification - 02/02/16 0820      Activity Barriers & Cardiac Risk Stratification   Activity Barriers None   Cardiac Risk Stratification High      6 Minute Walk:     6 Minute Walk    Row Name 02/02/16 1207         6 Minute Walk   Phase Initial     Distance 1596 feet     Walk Time 6 minutes     # of Rest  Breaks 1  rest break 17 seconds     MPH 3.34     METS 2.96     RPE 13     Perceived  Dyspnea  0     VO2 Peak 10.37     Symptoms Yes (comment)     Comments claudication pain- leg burning      Resting HR 45 bpm     Resting BP 98/60     Max Ex. HR 75 bpm     Max Ex. BP 142/70     2 Minute Post BP 114/60        Initial Exercise Prescription:     Initial Exercise Prescription - 02/02/16 1200      Date of Initial Exercise RX and Referring Provider   Date 02/02/16   Referring Provider Fransico Him MD     Treadmill   MPH 2.5   Grade 0   Minutes 10   METs 2.91     Bike   Level 0.5   Minutes 10   METs 2.05     NuStep   Level 3   Minutes 10   METs 2     Prescription Details   Frequency (times per week) 3   Duration Progress to 30 minutes of continuous aerobic without signs/symptoms of physical distress     Intensity   THRR 40-80% of Max Heartrate 57-114   Ratings of Perceived Exertion 11-13   Perceived Dyspnea 0-4     Progression   Progression Continue progressive overload as per policy without signs/symptoms or physical distress.     Resistance Training   Training Prescription Yes   Weight 2lbs   Reps 10-12      Perform Capillary Blood Glucose checks as needed.  Exercise Prescription Changes:      Exercise Prescription Changes    Row Name 02/15/16 1100             Response to Exercise   Blood Pressure (Admit) 104/60       Blood Pressure (Exercise) 152/70       Blood Pressure (Exit) 108/60       Heart Rate (Admit) 53 bpm       Heart Rate (Exercise) 91 bpm       Heart Rate (Exit) 58 bpm       Rating of Perceived Exertion (Exercise) 13       Duration Progress to 45 minutes of aerobic exercise without signs/symptoms of physical distress       Intensity THRR unchanged         Progression   Progression Continue to progress workloads to maintain intensity without signs/symptoms of physical distress.       Average METs 2.8         Resistance  Training   Training Prescription Yes       Weight 5lb       Reps 10-12         Treadmill   MPH 2.5       Grade 0       Minutes 10       METs 2.91         Bike   Level 0.5       Minutes 10       METs 2.05         NuStep   Level 3       Minutes 10       METs 3.3         Home Exercise Plan   Plans to continue exercise at Home       Frequency Add 4 additional days to program exercise sessions.  Exercise Comments:      Exercise Comments    Row Name 02/15/16 1202           Exercise Comments Pt is off to a great start with exercise           Discharge Exercise Prescription (Final Exercise Prescription Changes):     Exercise Prescription Changes - 02/15/16 1100      Response to Exercise   Blood Pressure (Admit) 104/60   Blood Pressure (Exercise) 152/70   Blood Pressure (Exit) 108/60   Heart Rate (Admit) 53 bpm   Heart Rate (Exercise) 91 bpm   Heart Rate (Exit) 58 bpm   Rating of Perceived Exertion (Exercise) 13   Duration Progress to 45 minutes of aerobic exercise without signs/symptoms of physical distress   Intensity THRR unchanged     Progression   Progression Continue to progress workloads to maintain intensity without signs/symptoms of physical distress.   Average METs 2.8     Resistance Training   Training Prescription Yes   Weight 5lb   Reps 10-12     Treadmill   MPH 2.5   Grade 0   Minutes 10   METs 2.91     Bike   Level 0.5   Minutes 10   METs 2.05     NuStep   Level 3   Minutes 10   METs 3.3     Home Exercise Plan   Plans to continue exercise at Home   Frequency Add 4 additional days to program exercise sessions.      Nutrition:  Target Goals: Understanding of nutrition guidelines, daily intake of sodium 1500mg , cholesterol 200mg , calories 30% from fat and 7% or less from saturated fats, daily to have 5 or more servings of fruits and vegetables.  Biometrics:     Pre Biometrics - 02/02/16 1214      Pre  Biometrics   Weight 195 lb 8.8 oz (88.7 kg)   Waist Circumference 38 inches   Hip Circumference 41 inches   Waist to Hip Ratio 0.93 %   Triceps Skinfold 8 mm   % Body Fat 23.7 %   Grip Strength 45 kg   Flexibility 7.5 in   Single Leg Stand 2.21 seconds       Nutrition Therapy Plan and Nutrition Goals:     Nutrition Therapy & Goals - 02/08/16 1055      Nutrition Therapy   Diet Therapeutic Lifestyle Changes     Personal Nutrition Goals   Personal Goal #1 Maintain current wt while in [place], educate and counsel regarding individualized specific dietary modifications aiming towards targeted core components such as weight, hypertension, lipid management, diabetes, heart failure and other comorbidities.   Expected Outcomes Short Term Goal: Understand basic principles of dietary content, such as calories, fat, sodium, cholesterol and nutrients.;Long Term Goal: Adherence to prescribed nutrition plan.      Nutrition Discharge: Nutrition Scores:     Nutrition Assessments - 02/15/16 1130      MEDFICTS Scores   Pre Score 21      Nutrition Goals Re-Evaluation:   Psychosocial: Target Goals: Acknowledge presence or absence of depression, maximize coping skills, provide positive support system. Participant is able to verbalize types and ability to use techniques and skills needed for reducing stress and depression.  Initial Review & Psychosocial Screening:     Initial Psych Review & Screening - 02/02/16 1150  Family Dynamics   Good Support System? Yes   Comments Brief assessment reveals no idetifiable barriers or interventions needed at this time     Barriers   Psychosocial barriers to participate in program There are no identifiable barriers or psychosocial needs.     Screening Interventions   Interventions Encouraged to exercise      Quality of Life Scores:     Quality of Life - 02/02/16 1216      Quality  of Life Scores   Health/Function Pre 26.27 %   Socioeconomic Pre 26.67 %   Psych/Spiritual Pre 28.57 %   Family Pre 28.8 %   GLOBAL Pre 27.21 %      PHQ-9: Recent Review Flowsheet Data    Depression screen [facility] 2/9 02/08/2016   Decreased Interest 0   Down, Depressed, Hopeless 0   PHQ - 2 Score 0      Psychosocial Evaluation and Intervention:   Psychosocial Re-Evaluation:     Psychosocial Re-Evaluation    Row Name 02/15/16 1216             Psychosocial Re-Evaluation   Interventions Encouraged to attend Cardiac Rehabilitation for the exercise       Continued Psychosocial Services Needed No          Vocational Rehabilitation: Provide vocational rehab assistance to qualifying candidates.   Vocational Rehab Evaluation & Intervention:     Vocational Rehab - 02/02/16 1150      Initial Vocational Rehab Evaluation & Intervention   Assessment shows need for Vocational Rehabilitation No  Mr Tinnes is retired      Education: Education Goals: Education classes will be provided on a weekly basis, covering required topics. Participant will state understanding/return demonstration of topics presented.  Learning Barriers/Preferences:     Learning Barriers/Preferences - 02/02/16 0820      Learning Barriers/Preferences   Learning Barriers Sight   Learning Preferences Written Material;Computer/Internet      Education Topics: Count Your Pulse:  -Group instruction provided by verbal instruction, demonstration, patient participation and written materials to support subject.  Instructors address importance of being able to find your pulse and how to count your pulse when at home without a heart monitor.  Patients get hands on experience counting their pulse with staff help and individually.   Heart Attack, Angina, and Risk Factor Modification:  -Group instruction provided by verbal instruction, video, and written materials to support subject.  Instructors address signs  and symptoms of angina and heart attacks.    Also discuss risk factors for heart disease and how to make changes to improve heart health risk factors.   Functional Fitness:  -Group instruction provided by verbal instruction, demonstration, patient participation, and written materials to support subject.  Instructors address safety measures for doing things around the house.  Discuss how to get up and down off the floor, how to pick things up properly, how to safely get out of a chair without assistance, and balance training.   Meditation and Mindfulness:  -Group instruction provided by verbal instruction, patient participation, and written materials to support subject.  Instructor addresses importance of mindfulness and meditation practice to help reduce stress and improve awareness.  Instructor also leads participants through a meditation exercise.    Stretching for Flexibility and Mobility:  -Group instruction provided by verbal instruction, patient participation, and written materials to support subject.  Instructors lead participants through series of stretches that are designed to increase flexibility thus improving mobility.  These stretches are  additional exercise for major muscle groups that are typically performed during regular warm up and cool down. Flowsheet Row CARDIAC REHAB PHASE II EXERCISE from 02/12/2016 in [place]  Date  02/12/16  Instruction Review Code  2- meets goals/outcomes      Hands Only CPR Anytime:  -Group instruction provided by verbal instruction, video, patient participation and written materials to support subject.  Instructors co-teach with AHA video for hands only CPR.  Participants get hands on experience with mannequins.   Nutrition I class: Heart Healthy Eating:  -Group instruction provided by PowerPoint slides, verbal discussion, and written materials to support subject matter. The instructor gives an explanation and  review of the Therapeutic Lifestyle Changes diet recommendations, which includes a discussion on lipid goals, dietary fat, sodium, fiber, plant stanol/sterol esters, sugar, and the components of a well-balanced, healthy diet.   Nutrition II class: Lifestyle Skills:  -Group instruction provided by PowerPoint slides, verbal discussion, and written materials to support subject matter. The instructor gives an explanation and review of label reading, grocery shopping for heart health, heart healthy recipe modifications, and ways to make healthier choices when eating out.   Diabetes Question & Answer:  -Group instruction provided by PowerPoint slides, verbal discussion, and written materials to support subject matter. The instructor gives an explanation and review of diabetes co-morbidities, pre- and post-prandial blood glucose goals, pre-exercise blood glucose goals, signs, symptoms, and treatment of hypoglycemia and hyperglycemia, and foot care basics.   Diabetes Blitz:  -Group instruction provided by PowerPoint slides, verbal discussion, and written materials to support subject matter. The instructor gives an explanation and review of the physiology behind type 1 and type 2 diabetes, diabetes medications and rational behind using different medications, pre- and post-prandial blood glucose recommendations and Hemoglobin A1c goals, diabetes diet, and exercise including blood glucose guidelines for exercising safely.    Portion Distortion:  -Group instruction provided by PowerPoint slides, verbal discussion, written materials, and food models to support subject matter. The instructor gives an explanation of serving size versus portion size, changes in portions sizes over the last 20 years, and what consists of a serving from each food group.   Stress Management:  -Group instruction provided by verbal instruction, video, and written materials to support subject matter.  Instructors review role of stress  in heart disease and how to cope with stress positively.     Exercising on Your Own:  -Group instruction provided by verbal instruction, power point, and written materials to support subject.  Instructors discuss benefits of exercise, components of exercise, frequency and intensity of exercise, and end points for exercise.  Also discuss use of nitroglycerin and activating EMS.  Review options of places to exercise outside of rehab.  Review guidelines for sex with heart disease.   Cardiac Drugs I:  -Group instruction provided by verbal instruction and written materials to support subject.  Instructor reviews cardiac drug classes: antiplatelets, anticoagulants, beta blockers, and statins.  Instructor discusses reasons, side effects, and lifestyle considerations for each drug class.   Cardiac Drugs II:  -Group instruction provided by verbal instruction and written materials to support subject.  Instructor reviews cardiac drug classes: angiotensin converting enzyme inhibitors (ACE-I), angiotensin II receptor blockers (ARBs), nitrates, and calcium channel blockers.  Instructor discusses reasons, side effects, and lifestyle considerations for each drug class.   Anatomy and Physiology of the Circulatory System:  -Group instruction provided by verbal instruction, video, and written materials to support subject.  Reviews functional  anatomy of heart, how it relates to various diagnoses, and what role the heart plays in the overall system.   Knowledge Questionnaire Score:   Core Components/Risk Factors/Patient Goals at Admission:     Personal Goals and Risk Factors at Admission - 02/02/16 1429      Core Components/Risk Factors/Patient Goals on Admission    Weight Management Yes;Weight Loss   Intervention Weight Management: Develop a combined nutrition and exercise program designed to reach desired caloric intake, while maintaining appropriate intake of nutrient and fiber, sodium and fats, and  appropriate energy expenditure required for the weight goal.;Weight Management: Provide education and appropriate resources to help participant work on and attain dietary goals.;Obesity: Provide education and appropriate resources to help participant work on and attain dietary goals.   Expected Outcomes Short Term: Continue to assess and modify interventions until short term weight is achieved;Long Term: Adherence to nutrition and physical activity/exercise program aimed toward attainment of established weight goal;Weight Maintenance: Understanding of the daily nutrition guidelines, which includes 25-35% calories from fat, 7% or less cal from saturated fats, less than 200mg  cholesterol, less than 1.5gm of sodium, & 5 or more servings of fruits and vegetables daily;Weight Loss: Understanding of general recommendations for a balanced deficit meal plan, which promotes 1-2 lb weight loss per week and includes a negative energy balance of [phone] kcal/d   Sedentary Yes   Intervention Provide advice, education, support and counseling about physical activity/exercise needs.;Develop an individualized exercise prescription for aerobic and resistive training based on initial evaluation findings, risk stratification, comorbidities and participant's personal goals.   Expected Outcomes Achievement of increased cardiorespiratory fitness and enhanced flexibility, muscular endurance and strength shown through measurements of functional capacity and personal statement of participant.   Increase Strength and Stamina Yes   Intervention Provide advice, education, support and counseling about physical activity/exercise needs.;Develop an individualized exercise prescription for aerobic and resistive training based on initial evaluation findings, risk stratification, comorbidities and participant's personal goals.   Expected Outcomes Achievement of increased cardiorespiratory fitness and enhanced flexibility, muscular endurance  and strength shown through measurements of functional capacity and personal statement of participant.   Hypertension Yes   Intervention Provide education on lifestyle modifcations including regular physical activity/exercise, weight management, moderate sodium restriction and increased consumption of fresh fruit, vegetables, and low fat dairy, alcohol moderation, and smoking cessation.;Monitor prescription use compliance.   Expected Outcomes Short Term: Continued assessment and intervention until BP is < 140/81mm HG in hypertensive participants. < 130/13mm HG in hypertensive participants with diabetes, heart failure or chronic kidney disease.;Long Term: Maintenance of blood pressure at goal levels.   Lipids Yes   Intervention Provide education and support for participant on nutrition & aerobic/resistive exercise along with prescribed medications to achieve LDL 70mg , HDL >40mg .   Expected Outcomes Short Term: Participant states understanding of desired cholesterol values and is compliant with medications prescribed. Participant is following exercise prescription and nutrition guidelines.;Long Term: Cholesterol controlled with medications as prescribed, with individualized exercise RX and with personalized nutrition plan. Value goals: LDL < 70mg , HDL > 40 mg.   Personal Goal Other Yes   Personal Goal Increase stamina and flexibility, and be able to play golf   Intervention Provide exercise programming and provide education on stretching, ROM and functional mobility to assist with improving functional mobility/flexibility.    Expected Outcomes Pt will attend cardiac education classes and participate in active and passive streching. Pt will also be able to return to golf without diffculty.  Core Components/Risk Factors/Patient Goals Review:      Goals and Risk Factor Review    Row Name 02/15/16 1202             Core Components/Risk Factors/Patient Goals Review   Personal Goals Review  Increase Strength and Stamina;Other       Review Reviewed HEP with pt.  Pt stated he is already walking at home on his off days from [facility] 15-62min/day.  I encouraged him to continue walking and increase his time 1-3 min. each day until he is exercising continuously for 30-45 minutes w/o any symptoms       Expected Outcomes Pt will increase strength and stamina, have less fatigue and improve overall cardiorespiratory fitness.           Core Components/Risk Factors/Patient Goals at Discharge (Final Review):      Goals and Risk Factor Review - 02/15/16 1202      Core Components/Risk Factors/Patient Goals Review   Personal Goals Review Increase Strength and Stamina;Other   Review Reviewed HEP with pt.  Pt stated he is already walking at home on his off days from [facility] 15-84min/day.  I encouraged him to continue walking and increase his time 1-3 min. each day until he is exercising continuously for 30-45 minutes w/o any symptoms   Expected Outcomes Pt will increase strength and stamina, have less fatigue and improve overall cardiorespiratory fitness.       ITP Comments:     ITP Comments    Row Name 02/02/16 0818           ITP Comments Dr. Fransico Him, Medical Director          Comments: Wille Glaser is making expected progress toward personal goals after completing 5 sessions. Recommend continued exercise and life style modification education including  stress management and relaxation techniques to decrease cardiac risk profile. [name] is off to a good start and is enjoying participating in the program so far.Barnet Pall, RN,BSN 02/17/2016 5:04 PM

## 2016-02-17 ENCOUNTER — Encounter (HOSPITAL_COMMUNITY): Payer: PPO

## 2016-02-17 ENCOUNTER — Encounter (HOSPITAL_COMMUNITY)
Admission: RE | Admit: 2016-02-17 | Discharge: 2016-02-17 | Disposition: A | Discharge status: Home / Self Care | Payer: PPO | Source: Ambulatory Visit | Attending: Cardiology | Admitting: Cardiology

## 2016-02-17 DIAGNOSIS — I214 Non-ST elevation (NSTEMI) myocardial infarction: Secondary | ICD-10-CM | POA: Diagnosis not present

## 2016-02-17 DIAGNOSIS — Z951 Presence of aortocoronary bypass graft: Secondary | ICD-10-CM

## 2016-02-19 ENCOUNTER — Encounter (HOSPITAL_COMMUNITY)
Admission: RE | Admit: 2016-02-19 | Discharge: 2016-02-19 | Disposition: A | Discharge status: Home / Self Care | Payer: PPO | Source: Ambulatory Visit | Attending: Cardiology | Admitting: Cardiology

## 2016-02-19 ENCOUNTER — Encounter (HOSPITAL_COMMUNITY): Payer: PPO

## 2016-02-19 DIAGNOSIS — I214 Non-ST elevation (NSTEMI) myocardial infarction: Secondary | ICD-10-CM | POA: Insufficient documentation

## 2016-02-19 DIAGNOSIS — Z951 Presence of aortocoronary bypass graft: Secondary | ICD-10-CM | POA: Diagnosis not present

## 2016-02-22 ENCOUNTER — Encounter (HOSPITAL_COMMUNITY)
Admission: RE | Admit: 2016-02-22 | Discharge: 2016-02-22 | Disposition: A | Discharge status: Home / Self Care | Payer: PPO | Source: Ambulatory Visit | Attending: Cardiology | Admitting: Cardiology

## 2016-02-22 ENCOUNTER — Encounter (HOSPITAL_COMMUNITY): Payer: PPO

## 2016-02-22 DIAGNOSIS — Z951 Presence of aortocoronary bypass graft: Secondary | ICD-10-CM

## 2016-02-22 DIAGNOSIS — I214 Non-ST elevation (NSTEMI) myocardial infarction: Secondary | ICD-10-CM | POA: Diagnosis not present

## 2016-02-24 ENCOUNTER — Encounter (HOSPITAL_COMMUNITY)
Admission: RE | Admit: 2016-02-24 | Discharge: 2016-02-24 | Disposition: A | Discharge status: Home / Self Care | Payer: PPO | Source: Ambulatory Visit | Attending: Cardiology | Admitting: Cardiology

## 2016-02-24 ENCOUNTER — Encounter (HOSPITAL_COMMUNITY): Payer: PPO

## 2016-02-24 DIAGNOSIS — Z951 Presence of aortocoronary bypass graft: Secondary | ICD-10-CM

## 2016-02-24 DIAGNOSIS — I214 Non-ST elevation (NSTEMI) myocardial infarction: Secondary | ICD-10-CM

## 2016-02-26 ENCOUNTER — Encounter (HOSPITAL_COMMUNITY): Payer: PPO

## 2016-02-26 ENCOUNTER — Encounter (HOSPITAL_COMMUNITY)
Admission: RE | Admit: 2016-02-26 | Discharge: 2016-02-26 | Disposition: A | Discharge status: Home / Self Care | Payer: PPO | Source: Ambulatory Visit | Attending: Cardiology | Admitting: Cardiology

## 2016-02-26 DIAGNOSIS — I214 Non-ST elevation (NSTEMI) myocardial infarction: Secondary | ICD-10-CM | POA: Diagnosis not present

## 2016-02-26 DIAGNOSIS — Z951 Presence of aortocoronary bypass graft: Secondary | ICD-10-CM

## 2016-02-29 ENCOUNTER — Encounter (HOSPITAL_COMMUNITY)
Admission: RE | Admit: 2016-02-29 | Discharge: 2016-02-29 | Disposition: A | Discharge status: Home / Self Care | Payer: PPO | Source: Ambulatory Visit | Attending: Cardiology | Admitting: Cardiology

## 2016-02-29 ENCOUNTER — Encounter (HOSPITAL_COMMUNITY): Payer: PPO

## 2016-02-29 DIAGNOSIS — Z951 Presence of aortocoronary bypass graft: Secondary | ICD-10-CM

## 2016-02-29 DIAGNOSIS — I214 Non-ST elevation (NSTEMI) myocardial infarction: Secondary | ICD-10-CM | POA: Diagnosis not present

## 2016-03-02 ENCOUNTER — Encounter (HOSPITAL_COMMUNITY): Payer: PPO

## 2016-03-02 ENCOUNTER — Telehealth: Payer: Self-pay | Admitting: Cardiology

## 2016-03-02 ENCOUNTER — Encounter (HOSPITAL_COMMUNITY)
Admission: RE | Admit: 2016-03-02 | Discharge: 2016-03-02 | Disposition: A | Discharge status: Home / Self Care | Payer: PPO | Source: Ambulatory Visit | Attending: Cardiology | Admitting: Cardiology

## 2016-03-02 DIAGNOSIS — I214 Non-ST elevation (NSTEMI) myocardial infarction: Secondary | ICD-10-CM

## 2016-03-02 DIAGNOSIS — Z951 Presence of aortocoronary bypass graft: Secondary | ICD-10-CM

## 2016-03-02 NOTE — Telephone Encounter (Signed)
Conversation  (Newest Message First)  Sueanne Margarita, MD   03/02/16 11:18 AM  Note    Please verify it patient is taking Diltiazem PRN or daily    Marcus Serge, NP   03/02/16 11:11 AM  Note    [facility] rehab called-  HR down to 42, pt currently stable but has been dizzy at home at times.  His lopressor had been decreased already to 12.5 BID.  He is on amiodarone but pt not sure.  He will call office to get Dr. Theodosia Blender opinion to stop amiodarone or BB.  Last EKG SB at 43.     [name]  to Marcus Serge, NP  03/02/16 11:11 AM      Spoke with patient at [facility]. He states he is taking his Dilt PRN. Per Dr. Radford Pax, instructed patient to STOP METOPROLOL and check BP and HR daily for a week and call with results.  Patient agrees with treatment plan.

## 2016-03-02 NOTE — Progress Notes (Signed)
[name] 76 y.o. male Nutrition Note Spoke with pt. Nutrition Survey reviewed with pt. Pt is following Step 2 of the Therapeutic Lifestyle Changes diet. Pt expressed understanding of the information reviewed. Pt aware of nutrition education classes offered. Lab Results  Component Value Date   HGBA1C 5.8 (H) 12/18/2015   Wt Readings from Last 3 Encounters:  02/02/16 195 lb 8.8 oz (88.7 kg)  01/05/16 184 lb (83.5 kg)  01/04/16 194 lb (88 kg)   Nutrition Diagnosis ? Food-and nutrition-related knowledge deficit related to lack of exposure to information as related to diagnosis of: ? CVD ? Pre-DM Nutrition Intervention ? Benefits of adopting Therapeutic Lifestyle Changes discussed when Med[employer] reviewed. ? Pt to attend the Portion Distortion class ? Pt to attend the  ? Nutrition I class - met; 02/23/16                     ? Nutrition II class - met; 03/01/16 ? Continue client-centered nutrition education by RD, as part of interdisciplinary care.  Goal(s) ? Pt to describe the benefit of including fruits, vegetables, whole grains, and low-fat dairy products in a heart healthy meal plan.  Monitor and Evaluate progress toward nutrition goal with team.  Derek Mound, M.Ed, RD, LDN, CDE 03/02/2016 11:51 AM

## 2016-03-02 NOTE — Progress Notes (Signed)
[name]'s resting heart rate was noted at 42 today at cardiac rehab, nonsustained. Blood pressure noted 112/70. Entry blood pressure was 94/62. [name] was asymptomatic but reported that he sometimes feels lightheaded in the morning after taking his morning medications. Cecilie Kicks FNP-C notified. Mickel Baas told me to call Dr Theodosia Blender office. Dr Theodosia Blender office called and notified. Will fax exercise flow sheets to Dr. Theodosia Blender office for review with today's ECG tracings.Barnet Pall, RN,BSN 03/02/2016 11:22 AM

## 2016-03-02 NOTE — Telephone Encounter (Signed)
[facility] rehab called-  HR down to 42, pt currently stable but has been dizzy at home at times.  His lopressor had been decreased already to 12.5 BID.  He is on amiodarone but pt not sure.  He will call office to get Dr. Theodosia Blender opinion to stop amiodarone or BB.  Last EKG SB at 43.

## 2016-03-02 NOTE — Telephone Encounter (Signed)
Please verify it patient is taking Diltiazem PRN or daily

## 2016-03-02 NOTE — Telephone Encounter (Signed)
New message      Pt just finished cardiac rehab and his HR is 42-43.  He is asymptomatic.  Verdis Frederickson is faxing strips.  Can she let the pt go home?

## 2016-03-02 NOTE — Telephone Encounter (Signed)
Have patient stop metoprolol and check BP and HR daily for 1 week and call with results

## 2016-03-04 ENCOUNTER — Encounter (HOSPITAL_COMMUNITY): Payer: PPO

## 2016-03-04 ENCOUNTER — Encounter (HOSPITAL_COMMUNITY)
Admission: RE | Admit: 2016-03-04 | Discharge: 2016-03-04 | Disposition: A | Discharge status: Home / Self Care | Payer: PPO | Source: Ambulatory Visit | Attending: Cardiology | Admitting: Cardiology

## 2016-03-04 DIAGNOSIS — I214 Non-ST elevation (NSTEMI) myocardial infarction: Secondary | ICD-10-CM | POA: Diagnosis not present

## 2016-03-04 DIAGNOSIS — Z951 Presence of aortocoronary bypass graft: Secondary | ICD-10-CM

## 2016-03-07 ENCOUNTER — Encounter (HOSPITAL_COMMUNITY): Payer: PPO

## 2016-03-07 ENCOUNTER — Encounter (HOSPITAL_COMMUNITY)
Admission: RE | Admit: 2016-03-07 | Discharge: 2016-03-07 | Disposition: A | Discharge status: Home / Self Care | Payer: PPO | Source: Ambulatory Visit | Attending: Cardiology | Admitting: Cardiology

## 2016-03-07 DIAGNOSIS — I214 Non-ST elevation (NSTEMI) myocardial infarction: Secondary | ICD-10-CM | POA: Diagnosis not present

## 2016-03-07 DIAGNOSIS — Z951 Presence of aortocoronary bypass graft: Secondary | ICD-10-CM

## 2016-03-09 ENCOUNTER — Encounter (HOSPITAL_COMMUNITY): Payer: PPO

## 2016-03-09 ENCOUNTER — Encounter (HOSPITAL_COMMUNITY)
Admission: RE | Admit: 2016-03-09 | Discharge: 2016-03-09 | Disposition: A | Discharge status: Home / Self Care | Payer: PPO | Source: Ambulatory Visit | Attending: Cardiology | Admitting: Cardiology

## 2016-03-09 DIAGNOSIS — I214 Non-ST elevation (NSTEMI) myocardial infarction: Secondary | ICD-10-CM | POA: Diagnosis not present

## 2016-03-09 DIAGNOSIS — Z951 Presence of aortocoronary bypass graft: Secondary | ICD-10-CM

## 2016-03-10 ENCOUNTER — Telehealth: Payer: Self-pay

## 2016-03-10 DIAGNOSIS — M21622 Bunionette of left foot: Secondary | ICD-10-CM | POA: Diagnosis not present

## 2016-03-10 DIAGNOSIS — M79672 Pain in left foot: Secondary | ICD-10-CM | POA: Diagnosis not present

## 2016-03-10 NOTE — Telephone Encounter (Signed)
Patient wants to verify it is fine to bring BP log tomorrow. Informed him that if he drops it off at the front desk, it will be given to nursing. He was grateful for call.

## 2016-03-11 ENCOUNTER — Encounter (HOSPITAL_COMMUNITY)
Admission: RE | Admit: 2016-03-11 | Discharge: 2016-03-11 | Disposition: A | Discharge status: Home / Self Care | Payer: PPO | Source: Ambulatory Visit | Attending: Cardiology | Admitting: Cardiology

## 2016-03-11 ENCOUNTER — Encounter (HOSPITAL_COMMUNITY): Payer: PPO

## 2016-03-11 DIAGNOSIS — I214 Non-ST elevation (NSTEMI) myocardial infarction: Secondary | ICD-10-CM | POA: Diagnosis not present

## 2016-03-11 DIAGNOSIS — Z951 Presence of aortocoronary bypass graft: Secondary | ICD-10-CM

## 2016-03-14 ENCOUNTER — Encounter (HOSPITAL_COMMUNITY)
Admission: RE | Admit: 2016-03-14 | Discharge: 2016-03-14 | Disposition: A | Discharge status: Home / Self Care | Payer: PPO | Source: Ambulatory Visit | Attending: Cardiology | Admitting: Cardiology

## 2016-03-14 ENCOUNTER — Encounter (HOSPITAL_COMMUNITY): Payer: PPO

## 2016-03-14 DIAGNOSIS — I214 Non-ST elevation (NSTEMI) myocardial infarction: Secondary | ICD-10-CM

## 2016-03-14 DIAGNOSIS — Z951 Presence of aortocoronary bypass graft: Secondary | ICD-10-CM

## 2016-03-15 ENCOUNTER — Telehealth: Payer: Self-pay

## 2016-03-15 NOTE — Progress Notes (Signed)
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  acetaminophen (TYLENOL) 500 MG tablet, Take 2 tablets (1,000 mg total) by mouth every 6 (six) hours as needed., Disp: 30 tablet, Rfl: 0 .  amiodarone (PACERONE) 200 MG tablet, Take 1 tablet (200 mg total) by mouth daily., Disp: 90 tablet, Rfl: 3 .  apixaban (ELIQUIS) 5 MG TABS tablet, Take 1 tablet (5 mg total) by mouth 2 (two) times daily., Disp: 60 tablet, Rfl: 6 .  aspirin EC 81 MG tablet, Take 81 mg by mouth daily., Disp: , Rfl:  .  b complex vitamins tablet, Take 1 tablet by mouth daily.  , Disp: , Rfl:  .  calcium carbonate (OS-CAL) 600 MG TABS, Take 600 mg by mouth daily.  , Disp: , Rfl:  .  Cholecalciferol 1000 UNITS capsule, Take 2,000 Units by mouth daily. , Disp: , Rfl:  .  Coenzyme Q10 (CO Q 10) 100 MG CAPS, Take 1 capsule by mouth daily., Disp: , Rfl:  .  cyanocobalamin 1000 MCG tablet, Take 1,000 mcg by mouth daily., Disp: , Rfl:  .  diltiazem (TIAZAC) 300 MG 24 hr capsule, Take 300 mg by mouth as needed., Disp: , Rfl:  .  EPINEPHrine (EPIPEN) 0.3 mg/0.3 mL SOAJ injection, Inject 0.3 mg into the muscle daily as needed (allergic reaction). , Disp: , Rfl:  .  finasteride (PROSCAR) 5 MG tablet, Take 5 mg by mouth daily., Disp: , Rfl:  .  hydrochlorothiazide (HYDRODIURIL) 25 MG tablet, Take 25 mg by mouth daily.,  Disp: , Rfl:  .  losartan (COZAAR) 25 MG tablet, Take 1 tablet (25 mg total) by mouth daily., Disp: 90 tablet, Rfl: 3 .  Magnesium Hydroxide (MAGNESIA PO), Take 1,000 mg by mouth daily. , Disp: , Rfl:  .  Multiple Vitamin (MULTIVITAMIN) tablet, Take 1 tablet by mouth daily.  , Disp: , Rfl:  .  niacinamide 500 MG tablet, Take 500 mg by mouth daily., Disp: , Rfl:  .  Omega-3 Fatty Acids (FISH OIL PO), Take 1,200 mg by mouth daily., Disp: , Rfl:  .  potassium gluconate 595 (99 K) MG TABS tablet, Take 595 mg by mouth., Disp: , Rfl:  .  rosuvastatin (CRESTOR) 40 MG tablet, Take 1 tablet (40 mg total) by mouth every evening., Disp: 90 tablet, Rfl: 3 .  Tamsulosin HCl (FLOMAX) 0.4 MG CAPS, Take 0.4 mg by mouth daily.  , Disp: , Rfl:  .  vitamin C (ASCORBIC ACID) 500 MG tablet, Take 2,000 mg by mouth daily., Disp: , Rfl:   Past Medical History: Past Medical History:  Diagnosis Date  . Arthritis    HANDS AND FEET  . Asymptomatic carotid artery stenosis BILATERAL    PER DOPPLER  01-05-10   RICA  1 - [id number]   LICA  40 - [id number] - followed by Marcus. Donnetta Frank  . CAD (coronary artery disease) 2002--  X2 BM STENTS  . Cataract immature BILATERAL   . Diverticulosis   . 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 History of diverticulosis    Noted on Colonoscopy  . History of inguinal herniorrhaphy   . Hydrocele, left   . Hyperlipidemia   . Hypertension   . Increased intraocular pressure   . Nocturia   . Paroxysmal atrial fibrillation ([place]) 06/30/14   chad2vasc score is at least 4  . Peripheral vascular disease ([place]) FOLLOWED BY [name]-- VISIT NOTE 01-05-10 AND CAROTID / VASCULAR DOPPLER  RESULTS W/ CHART   ACTIVE WALKING PROGRAM  . 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 Retinal detachment   . Stroke [name])     Tobacco Use: History  Smoking Status  . Former Smoker  . Years: 30.00  . Types: Cigarettes  . Quit date: 01/18/1978  Smokeless Tobacco  . Former Systems developer   . Quit date: 1980    Labs: Recent Review Flowsheet Data    Labs for ITP Cardiac and Pulmonary Rehab Latest Ref Rng & Units 12/18/2015 12/18/2015 12/18/2015 12/18/2015 12/19/2015   Cholestrol 0 - 200 mg/dL - - - - -   LDLCALC 0 - 99 mg/dL - - - - -   HDL >40 mg/dL - - - - -   Trlycerides <150 mg/dL - - - - -   Hemoglobin A1c 4.8 - 5.6 % - - - - -   PHART 7.350 - 7.450 7.460(H) 7.400 7.370 - -   PCO2ART 32.0 - 48.0 mmHg 33.7 35.9 36.4 - -   HCO3 20.0 - 28.0 mmol/L 24.3 22.3 21.1 - -   TCO2 0 - 100 mmol/L 25 23 22 23 23    ACIDBASEDEF 0.0 - 2.0 mmol/L - 2.0 4.0(H) - -   O2SAT % 99.0 98.0 98.0 - -      Capillary Blood Glucose: Lab Results  Component Value Date   GLUCAP 101 (H) 12/22/2015   GLUCAP 118 (H) 12/21/2015   GLUCAP 107 (H) 12/21/2015   GLUCAP 90 12/21/2015   GLUCAP 102 (H) 12/21/2015     Exercise Target Goals:    Exercise Program Goal: Individual exercise prescription set with THRR, safety & activity barriers. Participant demonstrates ability to understand and report RPE using BORG scale, to self-measure pulse accurately, and to acknowledge the importance of the exercise prescription.  Exercise Prescription Goal: Starting with aerobic activity 30 plus minutes a day, 3 days per week for initial exercise prescription. Provide home exercise prescription and guidelines that participant acknowledges understanding prior to discharge.  Activity Barriers & Risk Stratification:     Activity Barriers & Cardiac Risk Stratification - 02/02/16 0820      Activity Barriers & Cardiac Risk Stratification   Activity Barriers None   Cardiac Risk Stratification High      6 Minute Walk:     6 Minute Walk    Row Name 02/02/16 1207         6 Minute Walk   Phase Initial     Distance 1596 feet     Walk Time 6 minutes     # of Rest Breaks 1  rest break 17 seconds     MPH 3.34     METS 2.96     RPE 13     Perceived Dyspnea  0     VO2 Peak 10.37     Symptoms Yes (comment)      Comments claudication pain- leg  burning      Resting HR 45 bpm     Resting BP 98/60     Max Ex. HR 75 bpm     Max Ex. BP 142/70     2 Minute Post BP 114/60        Oxygen Initial Assessment:   Oxygen Re-Evaluation:   Oxygen Discharge (Final Oxygen Re-Evaluation):   Initial Exercise Prescription:     Initial Exercise Prescription - 02/02/16 1200      Date of Initial Exercise RX and Referring Provider   Date 02/02/16   Referring Provider Marcus Him MD     Treadmill   MPH 2.5   Grade 0   Minutes 10   METs 2.91     Bike   Level 0.5   Minutes 10   METs 2.05     NuStep   Level 3   Minutes 10   METs 2     Prescription Details   Frequency (times per week) 3   Duration Progress to 30 minutes of continuous aerobic without signs/symptoms of physical distress     Intensity   THRR 40-80% of Max Heartrate 57-114   Ratings of Perceived Exertion 11-13   Perceived Dyspnea 0-4     Progression   Progression Continue progressive overload as per policy without signs/symptoms or physical distress.     Resistance Training   Training Prescription Yes   Weight 2lbs   Reps 10-12      Perform Capillary Blood Glucose checks as needed.  Exercise Prescription Changes:      Exercise Prescription Changes    Row Name 02/15/16 1100 03/14/16 1100           Response to Exercise   Blood Pressure (Admit) 104/60 102/60      Blood Pressure (Exercise) 152/70 146/70      Blood Pressure (Exit) 108/60 110/64      Heart Rate (Admit) 53 bpm 68 bpm      Heart Rate (Exercise) 91 bpm 113 bpm      Heart Rate (Exit) 58 bpm 59 bpm      Rating of Perceived Exertion (Exercise) 13 12      Duration Progress to 45 minutes of aerobic exercise without signs/symptoms of physical distress Progress to 45 minutes of aerobic exercise without signs/symptoms of physical distress      Intensity THRR unchanged THRR unchanged        Progression   Progression Continue to progress workloads to  maintain intensity without signs/symptoms of physical distress. Continue to progress workloads to maintain intensity without signs/symptoms of physical distress.      Average METs 2.8 2.9        Resistance Training   Training Prescription Yes Yes      Weight 5lb 5lb      Reps 10-12 10-15        Treadmill   MPH 2.5 2.5      Grade 0 0      Minutes 10 10      METs 2.91 2.91        Bike   Level 0.5 0.5      Minutes 10 10      METs 2.05 2.05        NuStep   Level 3 4      Minutes 10 10      METs 3.3 4.3        Home Exercise Plan   Plans to continue exercise at [place] (  comment)      Frequency Add 4 additional days to program exercise sessions. Add 4 additional days to program exercise sessions.         Exercise Comments:      Exercise Comments    Row Name 02/15/16 1202 03/16/16 1651         Exercise Comments Pt is off to a great start with exercise  Reviewed METs and goals with pt.  Pt is doing great with exercise.          Exercise Goals and Review:      Exercise Goals    Row Name 03/16/16 1651             Exercise Goals   Increase Physical Activity Yes       Intervention Provide advice, education, support and counseling about physical activity/exercise needs.;Develop an individualized exercise prescription for aerobic and resistive training based on initial evaluation findings, risk stratification, comorbidities and participant's personal goals.       Expected Outcomes Achievement of increased cardiorespiratory fitness and enhanced flexibility, muscular endurance and strength shown through measurements of functional capacity and personal statement of participant.       Increase Strength and Stamina Yes       Intervention Provide advice, education, support and counseling about physical activity/exercise needs.;Develop an individualized exercise prescription for aerobic and resistive training based on initial evaluation findings, risk stratification,  comorbidities and participant's personal goals.       Expected Outcomes Achievement of increased cardiorespiratory fitness and enhanced flexibility, muscular endurance and strength shown through measurements of functional capacity and personal statement of participant.          Exercise Goals Re-Evaluation :     Exercise Goals Re-Evaluation    Row Name 03/14/16 1157             Exercise Goal Re-Evaluation   Exercise Goals Review Increase Physical Activity;Increase Strenth and Stamina       Comments Pt states that he is doing strength training with free weights at home 3 days/week (working most major muscle groups).  He does sit ups and walks about 2 miles everyday.       Expected Outcomes Continue with CR, HEP and increasing workloads as tolerated in order to increase strength, stamina and overall cardiorepiratory fitness.             Discharge Exercise Prescription (Final Exercise Prescription Changes):     Exercise Prescription Changes - 03/14/16 1100      Response to Exercise   Blood Pressure (Admit) 102/60   Blood Pressure (Exercise) 146/70   Blood Pressure (Exit) 110/64   Heart Rate (Admit) 68 bpm   Heart Rate (Exercise) 113 bpm   Heart Rate (Exit) 59 bpm   Rating of Perceived Exertion (Exercise) 12   Duration Progress to 45 minutes of aerobic exercise without signs/symptoms of physical distress   Intensity THRR unchanged     Progression   Progression Continue to progress workloads to maintain intensity without signs/symptoms of physical distress.   Average METs 2.9     Resistance Training   Training Prescription Yes   Weight 5lb   Reps 10-15     Treadmill   MPH 2.5   Grade 0   Minutes 10   METs 2.91     Bike   Level 0.5   Minutes 10   METs 2.05     NuStep   Level 4   Minutes 10   METs 4.3  Home Exercise Plan   Plans to continue exercise at Home (comment)   Frequency Add 4 additional days to program exercise sessions.      Nutrition:   Target Goals: Understanding of nutrition guidelines, daily intake of sodium 1500mg , cholesterol 200mg , calories 30% from fat and 7% or less from saturated fats, daily to have 5 or more servings of fruits and vegetables.  Biometrics:     Pre Biometrics - 02/02/16 1214      Pre Biometrics   Weight 195 lb 8.8 oz (88.7 kg)   Waist Circumference 38 inches   Hip Circumference 41 inches   Waist to Hip Ratio 0.93 %   Triceps Skinfold 8 mm   % Body Fat 23.7 %   Grip Strength 45 kg   Flexibility 7.5 in   Single Leg Stand 2.21 seconds       Nutrition Therapy Plan and Nutrition Goals:     Nutrition Therapy & Goals - 02/08/16 1055      Nutrition Therapy   Diet Therapeutic Lifestyle Changes     Personal Nutrition Goals   Nutrition Goal Maintain current wt while in Cardiac Rehab     Intervention Plan   Intervention Prescribe, educate and counsel regarding individualized specific dietary modifications aiming towards targeted core components such as weight, hypertension, lipid management, diabetes, heart failure and other comorbidities.   Expected Outcomes Short Term Goal: Understand basic principles of dietary content, such as calories, fat, sodium, cholesterol and nutrients.;Long Term Goal: Adherence to prescribed nutrition plan.      Nutrition Discharge: Nutrition Scores:     Nutrition Assessments - 02/15/16 1130      MEDFICTS Scores   Pre Score 21      Nutrition Goals Re-Evaluation:   Nutrition Goals Re-Evaluation:   Nutrition Goals Discharge (Final Nutrition Goals Re-Evaluation):   Psychosocial: Target Goals: Acknowledge presence or absence of significant depression and/or stress, maximize coping skills, provide positive support system. Participant is able to verbalize types and ability to use techniques and skills needed for reducing stress and depression.  Initial Review & Psychosocial Screening:     Initial Psych Review & Screening - 02/02/16 1150       Family Dynamics   Good Support System? Yes   Comments Brief assessment reveals no idetifiable barriers or interventions needed at this time     Barriers   Psychosocial barriers to participate in program There are no identifiable barriers or psychosocial needs.     Screening Interventions   Interventions Encouraged to exercise      Quality of Life Scores:     Quality of Life - 02/02/16 1216      Quality of Life Scores   Health/Function Pre 26.27 %   Socioeconomic Pre 26.67 %   Psych/Spiritual Pre 28.57 %   Family Pre 28.8 %   GLOBAL Pre 27.21 %      PHQ-9: Recent Review Flowsheet Data    Depression screen [facility] 2/9 02/08/2016   Decreased Interest 0   Down, Depressed, Hopeless 0   PHQ - 2 Score 0     Interpretation of Total Score  Total Score Depression Severity:  1-4 = Minimal depression, 5-9 = Mild depression, 10-14 = Moderate depression, 15-19 = Moderately severe depression, 20-27 = Severe depression   Psychosocial Evaluation and Intervention:   Psychosocial Re-Evaluation:     Psychosocial Re-Evaluation    [place] Name 02/15/16 1216 03/15/16 1257           Psychosocial Re-Evaluation  Current issues with  - None Identified      Interventions Encouraged to attend Cardiac Rehabilitation for the exercise Encouraged to attend Cardiac Rehabilitation for the exercise      Continue Psychosocial Services  No No Follow up required         Psychosocial Discharge (Final Psychosocial Re-Evaluation):     Psychosocial Re-Evaluation - 03/15/16 1257      Psychosocial Re-Evaluation   Current issues with None Identified   Interventions Encouraged to attend Cardiac Rehabilitation for the exercise   Continue Psychosocial Services  No Follow up required      Vocational Rehabilitation: Provide vocational rehab assistance to qualifying candidates.   Vocational Rehab Evaluation & Intervention:     Vocational Rehab - 02/02/16 1150      Initial Vocational Rehab Evaluation  & Intervention   Assessment shows need for Vocational Rehabilitation No  Mr Esparza is retired      Education: Education Goals: Education classes will be provided on a weekly basis, covering required topics. Participant will state understanding/return demonstration of topics presented.  Learning Barriers/Preferences:     Learning Barriers/Preferences - 02/02/16 0820      Learning Barriers/Preferences   Learning Barriers Sight   Learning Preferences Written Material;Computer/Internet      Education Topics: Count Your Pulse:  -Group instruction provided by verbal instruction, demonstration, patient participation and written materials to support subject.  Instructors address importance of being able to find your pulse and how to count your pulse when at home without a heart monitor.  Patients get hands on experience counting their pulse with staff help and individually.   Heart Attack, Angina, and Risk Factor Modification:  -Group instruction provided by verbal instruction, video, and written materials to support subject.  Instructors address signs and symptoms of angina and heart attacks.    Also discuss risk factors for heart disease and how to make changes to improve heart health risk factors.   Functional Fitness:  -Group instruction provided by verbal instruction, demonstration, patient participation, and written materials to support subject.  Instructors address safety measures for doing things around the house.  Discuss how to get up and down off the floor, how to pick things up properly, how to safely get out of a chair without assistance, and balance training.   Meditation and Mindfulness:  -Group instruction provided by verbal instruction, patient participation, and written materials to support subject.  Instructor addresses importance of mindfulness and meditation practice to help reduce stress and improve awareness.  Instructor also leads participants through a meditation  exercise.    Stretching for Flexibility and Mobility:  -Group instruction provided by verbal instruction, patient participation, and written materials to support subject.  Instructors lead participants through series of stretches that are designed to increase flexibility thus improving mobility.  These stretches are additional exercise for major muscle groups that are typically performed during regular warm up and cool down. Flowsheet Row CARDIAC REHAB PHASE II EXERCISE from 02/29/2016 in [place]  Date  02/12/16  Instruction Review Code  2- meets goals/outcomes      Hands Only CPR Anytime:  -Group instruction provided by verbal instruction, video, patient participation and written materials to support subject.  Instructors co-teach with AHA video for hands only CPR.  Participants get hands on experience with mannequins.   Nutrition I class: Heart Healthy Eating:  -Group instruction provided by PowerPoint slides, verbal discussion, and written materials to support subject matter. The instructor gives an  explanation and review of the Therapeutic Lifestyle Changes diet recommendations, which includes a discussion on lipid goals, dietary fat, sodium, fiber, plant stanol/sterol esters, sugar, and the components of a well-balanced, healthy diet. Flowsheet Row CARDIAC REHAB PHASE II EXERCISE from 02/29/2016 in [place]  Date  02/23/16  Educator  RD  Instruction Review Code  2- meets goals/outcomes      Nutrition II class: Lifestyle Skills:  -Group instruction provided by PowerPoint slides, verbal discussion, and written materials to support subject matter. The instructor gives an explanation and review of label reading, grocery shopping for heart health, heart healthy recipe modifications, and ways to make healthier choices when eating out. Flowsheet Row CARDIAC REHAB PHASE II EXERCISE from 02/29/2016 in [place]  Date  03/01/16  Educator  RD  Instruction Review Code  2- meets goals/outcomes      Diabetes Question & Answer:  -Group instruction provided by PowerPoint slides, verbal discussion, and written materials to support subject matter. The instructor gives an explanation and review of diabetes co-morbidities, pre- and post-prandial blood glucose goals, pre-exercise blood glucose goals, signs, symptoms, and treatment of hypoglycemia and hyperglycemia, and foot care basics.   Diabetes Blitz:  -Group instruction provided by PowerPoint slides, verbal discussion, and written materials to support subject matter. The instructor gives an explanation and review of the physiology behind type 1 and type 2 diabetes, diabetes medications and rational behind using different medications, pre- and post-prandial blood glucose recommendations and Hemoglobin A1c goals, diabetes diet, and exercise including blood glucose guidelines for exercising safely.    Portion Distortion:  -Group instruction provided by PowerPoint slides, verbal discussion, written materials, and food models to support subject matter. The instructor gives an explanation of serving size versus portion size, changes in portions sizes over the last 20 years, and what consists of a serving from each food group.   Stress Management:  -Group instruction provided by verbal instruction, video, and written materials to support subject matter.  Instructors review role of stress in heart disease and how to cope with stress positively.     Exercising on Your Own:  -Group instruction provided by verbal instruction, power point, and written materials to support subject.  Instructors discuss benefits of exercise, components of exercise, frequency and intensity of exercise, and end points for exercise.  Also discuss use of nitroglycerin and activating EMS.  Review options of places to exercise outside of rehab.  Review guidelines for sex with  heart disease.   Cardiac Drugs I:  -Group instruction provided by verbal instruction and written materials to support subject.  Instructor reviews cardiac drug classes: antiplatelets, anticoagulants, beta blockers, and statins.  Instructor discusses reasons, side effects, and lifestyle considerations for each drug class.   Cardiac Drugs II:  -Group instruction provided by verbal instruction and written materials to support subject.  Instructor reviews cardiac drug classes: angiotensin converting enzyme inhibitors (ACE-I), angiotensin II receptor blockers (ARBs), nitrates, and calcium channel blockers.  Instructor discusses reasons, side effects, and lifestyle considerations for each drug class.   Anatomy and Physiology of the Circulatory System:  -Group instruction provided by verbal instruction, video, and written materials to support subject.  Reviews functional anatomy of heart, how it relates to various diagnoses, and what role the heart plays in the overall system. Flowsheet Row CARDIAC REHAB PHASE II EXERCISE from 02/29/2016 in [place]  Date  02/24/16  Instruction Review Code  2- meets  goals/outcomes      Knowledge Questionnaire Score:   Core Components/Risk Factors/Patient Goals at Admission:     Personal Goals and Risk Factors at Admission - 02/02/16 1429      Core Components/Risk Factors/Patient Goals on Admission    Weight Management Yes;Weight Loss   Intervention Weight Management: Develop a combined nutrition and exercise program designed to reach desired caloric intake, while maintaining appropriate intake of nutrient and fiber, sodium and fats, and appropriate energy expenditure required for the weight goal.;Weight Management: Provide education and appropriate resources to help participant work on and attain dietary goals.;Obesity: Provide education and appropriate resources to help participant work on and attain dietary goals.   Expected  Outcomes Short Term: Continue to assess and modify interventions until short term weight is achieved;Long Term: Adherence to nutrition and physical activity/exercise program aimed toward attainment of established weight goal;Weight Maintenance: Understanding of the daily nutrition guidelines, which includes 25-35% calories from fat, 7% or less cal from saturated fats, less than 200mg  cholesterol, less than 1.5gm of sodium, & 5 or more servings of fruits and vegetables daily;Weight Loss: Understanding of general recommendations for a balanced deficit meal plan, which promotes 1-2 lb weight loss per week and includes a negative energy balance of [phone] kcal/d   Sedentary Yes   Intervention Provide advice, education, support and counseling about physical activity/exercise needs.;Develop an individualized exercise prescription for aerobic and resistive training based on initial evaluation findings, risk stratification, comorbidities and participant's personal goals.   Expected Outcomes Achievement of increased cardiorespiratory fitness and enhanced flexibility, muscular endurance and strength shown through measurements of functional capacity and personal statement of participant.   Increase Strength and Stamina Yes   Intervention Provide advice, education, support and counseling about physical activity/exercise needs.;Develop an individualized exercise prescription for aerobic and resistive training based on initial evaluation findings, risk stratification, comorbidities and participant's personal goals.   Expected Outcomes Achievement of increased cardiorespiratory fitness and enhanced flexibility, muscular endurance and strength shown through measurements of functional capacity and personal statement of participant.   Hypertension Yes   Intervention Provide education on lifestyle modifcations including regular physical activity/exercise, weight management, moderate sodium restriction and increased  consumption of fresh fruit, vegetables, and low fat dairy, alcohol moderation, and smoking cessation.;Monitor prescription use compliance.   Expected Outcomes Short Term: Continued assessment and intervention until BP is < 140/62mm HG in hypertensive participants. < 130/42mm HG in hypertensive participants with diabetes, heart failure or chronic kidney disease.;Long Term: Maintenance of blood pressure at goal levels.   Lipids Yes   Intervention Provide education and support for participant on nutrition & aerobic/resistive exercise along with prescribed medications to achieve LDL 70mg , HDL >40mg .   Expected Outcomes Short Term: Participant states understanding of desired cholesterol values and is compliant with medications prescribed. Participant is following exercise prescription and nutrition guidelines.;Long Term: Cholesterol controlled with medications as prescribed, with individualized exercise RX and with personalized nutrition plan. Value goals: LDL < 70mg , HDL > 40 mg.   Personal Goal Other Yes   Personal Goal Increase stamina and flexibility, and be able to play golf   Intervention Provide exercise programming and provide education on stretching, ROM and functional mobility to assist with improving functional mobility/flexibility.    Expected Outcomes Pt will attend cardiac education classes and participate in active and passive streching. Pt will also be able to return to golf without diffculty.      Core Components/Risk Factors/Patient Goals Review:      Goals and Risk  Factor Review    Row Name 02/15/16 1202             Core Components/Risk Factors/Patient Goals Review   Personal Goals Review Increase Strength and Stamina;Other       Review Reviewed HEP with pt.  Pt stated he is already walking at home on his off days from [facility] 15-86min/day.  I encouraged Frank to continue walking and increase his time 1-3 min. each day until he is exercising continuously for 30-45 minutes w/o any  symptoms       Expected Outcomes Pt will increase strength and stamina, have less fatigue and improve overall cardiorespiratory fitness.           Core Components/Risk Factors/Patient Goals at Discharge (Final Review):      Goals and Risk Factor Review - 02/15/16 1202      Core Components/Risk Factors/Patient Goals Review   Personal Goals Review Increase Strength and Stamina;Other   Review Reviewed HEP with pt.  Pt stated he is already walking at home on his off days from [facility] 15-27min/day.  I encouraged Frank to continue walking and increase his time 1-3 min. each day until he is exercising continuously for 30-45 minutes w/o any symptoms   Expected Outcomes Pt will increase strength and stamina, have less fatigue and improve overall cardiorespiratory fitness.       ITP Comments:     ITP Comments    Row Name 02/02/16 0818           ITP Comments Marcus. Fransico Frank, Medical Director          Comments: Wille Glaser is making expected progress toward personal goals after completing 17 sessions. Recommend continued exercise and life style modification education including  stress management and relaxation techniques to decrease cardiac risk profile. Wille Glaser is enjoying coming to cardiac rehab and doing well with exercise. [name] is walking two miles a day on his non cardiac rehab days.Harrell Gave RN BSN

## 2016-03-15 NOTE — Telephone Encounter (Signed)
Received BP/HR log from patient. Per Dr. Radford Pax, BP/HR are stable. Patient states he feels "as solid as a log" since stopping Metoprolol and has no dizziness. He was grateful for call.

## 2016-03-16 ENCOUNTER — Encounter (HOSPITAL_COMMUNITY): Payer: PPO

## 2016-03-16 ENCOUNTER — Encounter (HOSPITAL_COMMUNITY)
Admission: RE | Admit: 2016-03-16 | Discharge: 2016-03-16 | Disposition: A | Discharge status: Home / Self Care | Payer: PPO | Source: Ambulatory Visit | Attending: Cardiology | Admitting: Cardiology

## 2016-03-16 DIAGNOSIS — Z951 Presence of aortocoronary bypass graft: Secondary | ICD-10-CM

## 2016-03-16 DIAGNOSIS — I214 Non-ST elevation (NSTEMI) myocardial infarction: Secondary | ICD-10-CM

## 2016-03-18 ENCOUNTER — Encounter (HOSPITAL_COMMUNITY)
Admission: RE | Admit: 2016-03-18 | Discharge: 2016-03-18 | Disposition: A | Discharge status: Home / Self Care | Payer: PPO | Source: Ambulatory Visit | Attending: Cardiology | Admitting: Cardiology

## 2016-03-18 ENCOUNTER — Encounter (HOSPITAL_COMMUNITY): Payer: PPO

## 2016-03-18 DIAGNOSIS — I214 Non-ST elevation (NSTEMI) myocardial infarction: Secondary | ICD-10-CM | POA: Diagnosis not present

## 2016-03-18 DIAGNOSIS — Z951 Presence of aortocoronary bypass graft: Secondary | ICD-10-CM | POA: Diagnosis not present

## 2016-03-21 ENCOUNTER — Encounter (HOSPITAL_COMMUNITY): Payer: PPO

## 2016-03-23 ENCOUNTER — Encounter (HOSPITAL_COMMUNITY)
Admission: RE | Admit: 2016-03-23 | Discharge: 2016-03-23 | Disposition: A | Discharge status: Home / Self Care | Payer: PPO | Source: Ambulatory Visit | Attending: Cardiology | Admitting: Cardiology

## 2016-03-23 ENCOUNTER — Encounter (HOSPITAL_COMMUNITY): Payer: PPO

## 2016-03-23 DIAGNOSIS — I214 Non-ST elevation (NSTEMI) myocardial infarction: Secondary | ICD-10-CM

## 2016-03-23 DIAGNOSIS — Z951 Presence of aortocoronary bypass graft: Secondary | ICD-10-CM

## 2016-03-25 ENCOUNTER — Encounter: Payer: Self-pay | Admitting: Vascular Surgery

## 2016-03-25 ENCOUNTER — Encounter (HOSPITAL_COMMUNITY)
Admission: RE | Admit: 2016-03-25 | Discharge: 2016-03-25 | Disposition: A | Discharge status: Home / Self Care | Payer: PPO | Source: Ambulatory Visit | Attending: Cardiology | Admitting: Cardiology

## 2016-03-25 ENCOUNTER — Encounter (HOSPITAL_COMMUNITY): Payer: PPO

## 2016-03-25 DIAGNOSIS — Z951 Presence of aortocoronary bypass graft: Secondary | ICD-10-CM

## 2016-03-25 DIAGNOSIS — I214 Non-ST elevation (NSTEMI) myocardial infarction: Secondary | ICD-10-CM

## 2016-03-28 ENCOUNTER — Encounter (HOSPITAL_COMMUNITY)
Admission: RE | Admit: 2016-03-28 | Discharge: 2016-03-28 | Disposition: A | Discharge status: Home / Self Care | Payer: PPO | Source: Ambulatory Visit | Attending: Cardiology | Admitting: Cardiology

## 2016-03-28 ENCOUNTER — Encounter (HOSPITAL_COMMUNITY): Payer: PPO

## 2016-03-28 DIAGNOSIS — Z951 Presence of aortocoronary bypass graft: Secondary | ICD-10-CM

## 2016-03-28 DIAGNOSIS — I214 Non-ST elevation (NSTEMI) myocardial infarction: Secondary | ICD-10-CM

## 2016-03-30 ENCOUNTER — Encounter (HOSPITAL_COMMUNITY): Payer: PPO

## 2016-03-30 ENCOUNTER — Encounter (HOSPITAL_COMMUNITY)
Admission: RE | Admit: 2016-03-30 | Discharge: 2016-03-30 | Disposition: A | Discharge status: Home / Self Care | Payer: PPO | Source: Ambulatory Visit | Attending: Cardiology | Admitting: Cardiology

## 2016-03-30 DIAGNOSIS — I214 Non-ST elevation (NSTEMI) myocardial infarction: Secondary | ICD-10-CM

## 2016-03-30 DIAGNOSIS — Z951 Presence of aortocoronary bypass graft: Secondary | ICD-10-CM

## 2016-04-01 ENCOUNTER — Encounter (HOSPITAL_COMMUNITY): Payer: PPO

## 2016-04-01 ENCOUNTER — Encounter (HOSPITAL_COMMUNITY)
Admission: RE | Admit: 2016-04-01 | Discharge: 2016-04-01 | Disposition: A | Discharge status: Home / Self Care | Payer: PPO | Source: Ambulatory Visit | Attending: Cardiology | Admitting: Cardiology

## 2016-04-01 DIAGNOSIS — Z951 Presence of aortocoronary bypass graft: Secondary | ICD-10-CM

## 2016-04-01 DIAGNOSIS — I214 Non-ST elevation (NSTEMI) myocardial infarction: Secondary | ICD-10-CM | POA: Diagnosis not present

## 2016-04-04 ENCOUNTER — Encounter (HOSPITAL_COMMUNITY)
Admission: RE | Admit: 2016-04-04 | Discharge: 2016-04-04 | Disposition: A | Discharge status: Home / Self Care | Payer: PPO | Source: Ambulatory Visit | Attending: Cardiology | Admitting: Cardiology

## 2016-04-04 ENCOUNTER — Encounter (HOSPITAL_COMMUNITY): Payer: PPO

## 2016-04-04 DIAGNOSIS — I214 Non-ST elevation (NSTEMI) myocardial infarction: Secondary | ICD-10-CM | POA: Diagnosis not present

## 2016-04-04 DIAGNOSIS — Z951 Presence of aortocoronary bypass graft: Secondary | ICD-10-CM

## 2016-04-05 ENCOUNTER — Ambulatory Visit (HOSPITAL_COMMUNITY)
Admission: RE | Admit: 2016-04-05 | Discharge: 2016-04-05 | Disposition: A | Discharge status: Home / Self Care | Payer: PPO | Source: Ambulatory Visit | Attending: Vascular Surgery | Admitting: Vascular Surgery

## 2016-04-05 ENCOUNTER — Ambulatory Visit (INDEPENDENT_AMBULATORY_CARE_PROVIDER_SITE_OTHER): Payer: PPO | Admitting: Vascular Surgery

## 2016-04-05 ENCOUNTER — Encounter: Payer: Self-pay | Admitting: Vascular Surgery

## 2016-04-05 VITALS — BP 140/68 | HR 53 | Temp 97.0°F | Resp 16 | Ht 71.5 in | Wt 190.0 lb

## 2016-04-05 DIAGNOSIS — I6523 Occlusion and stenosis of bilateral carotid arteries: Secondary | ICD-10-CM

## 2016-04-05 NOTE — Progress Notes (Signed)
[facility] [place]  Patient name: [name] MRN: [id number] DOB: July 22, 1940 Sex: male  REASON FOR VISIT: Follow-up carotid disease  HPI: [name] is a 76 y.o. male here today for follow-up. Since my last visit with him 6 months ago he is undergone coronary bypass grafting. He reports that in late December he had new onset of left arm aching. He noted this was similar to symptoms that he had prior to coronary stenting in the past. He underwent further evaluation was found to have critical coronary disease and underwent coronary bypass grafting. He is recovered and is back to his usual baseline. He is also status post aortobifemoral bypass for occlusive disease in 2000. He had undergone left carotid endarterectomy by myself on 03/11/2015 for symptomatic left carotid disease. His studies at that time suggested moderate right carotid stenosis as well. He is here today for continued follow-up. He has had no neurologic deficits.  Past Medical History:  Diagnosis Date  . Arthritis    HANDS AND FEET  . Asymptomatic carotid artery stenosis BILATERAL    PER DOPPLER  01-05-10   RICA  1 - 24%/   LICA  40 - 40% - followed by Dr. Donnetta Hutching  . CAD (coronary artery disease) 2002--  X2 BM STENTS  . Cataract immature BILATERAL   . Diverticulosis   . 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 History of diverticulosis    Noted on Colonoscopy  . History of inguinal herniorrhaphy   . Hydrocele, left   . Hyperlipidemia   . Hypertension   . Increased intraocular pressure   . Nocturia   . Paroxysmal atrial fibrillation ([place]) 06/30/14   chad2vasc score is at least 4  . Peripheral vascular disease ([place]) FOLLOWED BY DR Vernor Monnig-- VISIT NOTE 01-05-10 AND CAROTID / VASCULAR DOPPLER  RESULTS W/ CHART   ACTIVE WALKING PROGRAM  . 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 Retinal detachment   . Stroke [facility])      Family History  Problem Relation Age of Onset  . Heart disease Mother     AAA  Before age 54  . Cancer Mother 91    Colon cancer  . Hyperlipidemia Mother   . Heart failure Father     SOCIAL HISTORY: Social History  Substance Use Topics  . Smoking status: Former Smoker    Years: 30.00    Types: Cigarettes    Quit date: 01/18/1978  . Smokeless tobacco: Former Systems developer    Quit date: 1980  . Alcohol use 13.2 oz/week    1 Shots of liquor, 21 Standard drinks or equivalent per week     Comment: 2 oz of vodka per day    Allergies  Allergen Reactions  . Wasp Venom Shortness Of Breath and Swelling    Current Outpatient Prescriptions  Medication Sig Dispense Refill  . acetaminophen (TYLENOL) 500 MG tablet Take 2 tablets (1,000 mg total) by mouth every 6 (six) hours as needed. 30 tablet 0  . amiodarone (PACERONE) 200 MG tablet Take 1 tablet (200 mg total) by mouth daily. 90 tablet 3  . apixaban (ELIQUIS) 5 MG TABS tablet Take 1 tablet (5 mg total) by mouth 2 (two) times daily. 60 tablet 6  . aspirin EC 81 MG tablet Take 81 mg by mouth daily.    [name] b complex vitamins tablet Take 1 tablet by mouth daily.      . calcium  carbonate (OS-CAL) 600 MG TABS Take 600 mg by mouth daily.      . Cholecalciferol 1000 UNITS capsule Take 2,000 Units by mouth daily.     . Coenzyme Q10 (CO Q 10) 100 MG CAPS Take 1 capsule by mouth daily.    . cyanocobalamin 1000 MCG tablet Take 1,000 mcg by mouth daily.    [name] diltiazem (TIAZAC) 300 MG 24 hr capsule Take 300 mg by mouth as needed.    [name] EPINEPHrine (EPIPEN) 0.3 mg/0.3 mL SOAJ injection Inject 0.3 mg into the muscle daily as needed (allergic reaction).     . finasteride (PROSCAR) 5 MG tablet Take 5 mg by mouth daily.    . hydrochlorothiazide (HYDRODIURIL) 25 MG tablet Take 25 mg by mouth daily.    [name] losartan (COZAAR) 25 MG tablet Take 1 tablet (25 mg total) by mouth daily. 90 tablet 3  . Magnesium Hydroxide (MAGNESIA PO) Take 1,000 mg by mouth daily.       . Multiple Vitamin (MULTIVITAMIN) tablet Take 1 tablet by mouth daily.      . niacinamide 500 MG tablet Take 500 mg by mouth daily.    . Omega-3 Fatty Acids (FISH OIL PO) Take 1,200 mg by mouth daily.    . potassium gluconate 595 (99 K) MG TABS tablet Take 595 mg by mouth.    . rosuvastatin (CRESTOR) 40 MG tablet Take 1 tablet (40 mg total) by mouth every evening. 90 tablet 3  . Tamsulosin HCl (FLOMAX) 0.4 MG CAPS Take 0.4 mg by mouth daily.      . vitamin C (ASCORBIC ACID) 500 MG tablet Take 2,000 mg by mouth daily.     No current facility-administered medications for this visit.     REVIEW OF SYSTEMS:  [X]  denotes positive finding, [ ]  denotes negative finding Cardiac  Comments:  Chest pain or chest pressure:    Shortness of breath upon exertion:    Short of breath when lying flat:    Irregular heart rhythm:        Vascular    Pain in calf, thigh, or hip brought on by ambulation:    Pain in feet at night that wakes you up from your sleep:     Blood clot in your veins:    Leg swelling:           PHYSICAL EXAM: Vitals:   04/05/16 1031 04/05/16 1042  BP: 135/72 140/68  Pulse: (!) 53   Resp: 16   Temp: 97 F (36.1 C)   TempSrc: Oral   SpO2: 100%   Weight: 190 lb (86.2 kg)   Height: 5' 11.5" (1.816 m)     GENERAL: The patient is a well-nourished male, in no acute distress. The vital signs are documented above. CARDIOVASCULAR: Left carotid incision well-healed with no bruits bilaterally. 2+ radial pulses bilaterally. PULMONARY: There is good air exchange  MUSCULOSKELETAL: There are no major deformities or cyanosis. NEUROLOGIC: No focal weakness or paresthesias are detected. SKIN: There are no ulcers or rashes noted. PSYCHIATRIC: The patient has a normal affect.  DATA:  Carotid duplex today revealed widely patent left endarterectomy with no evidence of stenosis. The right carotid has a lower level of narrowing vitamin suggested on prior duplex. This does show mild  narrowing on the right.  MEDICAL ISSUES: Stable carotid disease. Widely patent endarterectomy and no severe stenosis in the right carotid artery. He'll be seen again in one year with repeat carotid duplex follow-up    Sherren Mocha  Katina Dung, MD FACS [facility] Tel [phone] Pager [phone]

## 2016-04-06 ENCOUNTER — Ambulatory Visit: Payer: PPO | Admitting: Cardiology

## 2016-04-06 ENCOUNTER — Telehealth: Payer: Self-pay

## 2016-04-06 ENCOUNTER — Encounter (HOSPITAL_COMMUNITY): Payer: PPO

## 2016-04-06 ENCOUNTER — Encounter (HOSPITAL_COMMUNITY)
Admission: RE | Admit: 2016-04-06 | Discharge: 2016-04-06 | Disposition: A | Discharge status: Home / Self Care | Payer: PPO | Source: Ambulatory Visit | Attending: Cardiology | Admitting: Cardiology

## 2016-04-06 DIAGNOSIS — Z951 Presence of aortocoronary bypass graft: Secondary | ICD-10-CM

## 2016-04-06 DIAGNOSIS — I214 Non-ST elevation (NSTEMI) myocardial infarction: Secondary | ICD-10-CM | POA: Diagnosis not present

## 2016-04-06 NOTE — Telephone Encounter (Signed)
Called patient to reschedule appointment. Scheduled patient at 4/5 at 0820.  Left message for patient to call and confirm appointment.

## 2016-04-06 NOTE — Telephone Encounter (Signed)
-----   Message from Magda Kiel, RN sent at 04/06/2016  9:53 AM EDT ----- Regarding: follow up appointment with Dr Karen Kitchens morning [name],  I hope you are having a good morning. Marcus [name] appointment was rescheduled with Dr Radford Pax yesterday. I was checking to see if you have the ability to get him an earlier appointment for follow up with Dr Radford Pax if you have any cancellations in the near future. Marcus [facility] appointment has bee rescheduled for May.  [name] is doing well with exercise here and his vital signs have been stable.   [name] has not seen Dr Radford Pax yet since his surgery for post hospital follow up. [name] has seen the physician extender.  [name] said he was disappointed that he could not see Dr Radford Pax yesterday.  I just wanted to bring this to your attention  I know you are extremely busy and may not be able to make any changes to his currently scheduled appointment.  Thanks for all that you do  Have a great day!  Verdis Frederickson

## 2016-04-08 ENCOUNTER — Encounter (HOSPITAL_COMMUNITY): Payer: PPO

## 2016-04-08 ENCOUNTER — Encounter (HOSPITAL_COMMUNITY)
Admission: RE | Admit: 2016-04-08 | Discharge: 2016-04-08 | Disposition: A | Discharge status: Home / Self Care | Payer: PPO | Source: Ambulatory Visit | Attending: Cardiology | Admitting: Cardiology

## 2016-04-08 DIAGNOSIS — Z951 Presence of aortocoronary bypass graft: Secondary | ICD-10-CM

## 2016-04-08 DIAGNOSIS — I214 Non-ST elevation (NSTEMI) myocardial infarction: Secondary | ICD-10-CM | POA: Diagnosis not present

## 2016-04-08 NOTE — Telephone Encounter (Signed)
Confirmed appointment 4/5. Patient was grateful for call.

## 2016-04-11 ENCOUNTER — Encounter (HOSPITAL_COMMUNITY)
Admission: RE | Admit: 2016-04-11 | Discharge: 2016-04-11 | Disposition: A | Discharge status: Home / Self Care | Payer: PPO | Source: Ambulatory Visit | Attending: Cardiology | Admitting: Cardiology

## 2016-04-11 ENCOUNTER — Encounter (HOSPITAL_COMMUNITY): Payer: PPO

## 2016-04-11 DIAGNOSIS — Z951 Presence of aortocoronary bypass graft: Secondary | ICD-10-CM

## 2016-04-11 DIAGNOSIS — I214 Non-ST elevation (NSTEMI) myocardial infarction: Secondary | ICD-10-CM | POA: Diagnosis not present

## 2016-04-12 NOTE — Progress Notes (Signed)
Cardiac Individual Treatment Plan  Patient Details  Name: [name] MRN: [id number] Date of Birth: 06-Nov-1940 Referring Provider:     [facility] from 02/02/2016 in [place]  Referring Provider  Fransico Him MD      Initial Encounter Date:    CARDIAC REHAB PHASE II ORIENTATION from 02/02/2016 in [place]  Date  02/02/16  Referring Provider  Fransico Him MD      Visit Diagnosis: 12/15/15 NSTEMI (non-ST elevated myocardial infarction) (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  acetaminophen (TYLENOL) 500 MG tablet, Take 2 tablets (1,000 mg total) by mouth every 6 (six) hours as needed., Disp: 30 tablet, Rfl: 0 .  amiodarone (PACERONE) 200 MG tablet, Take 1 tablet (200 mg total) by mouth daily., Disp: 90 tablet, Rfl: 3 .  apixaban (ELIQUIS) 5 MG TABS tablet, Take 1 tablet (5 mg total) by mouth 2 (two) times daily., Disp: 60 tablet, Rfl: 6 .  aspirin EC 81 MG tablet, Take 81 mg by mouth daily., Disp: , Rfl:  .  b complex vitamins tablet, Take 1 tablet by mouth daily.  , Disp: , Rfl:  .  calcium carbonate (OS-CAL) 600 MG TABS, Take 600 mg by mouth daily.  , Disp: , Rfl:  .  Cholecalciferol 1000 UNITS capsule, Take 2,000 Units by mouth daily. , Disp: , Rfl:  .  Coenzyme Q10 (CO Q 10) 100 MG CAPS, Take 1 capsule by mouth daily., Disp: , Rfl:  .  cyanocobalamin 1000 MCG tablet, Take 1,000 mcg by mouth daily., Disp: , Rfl:  .  diltiazem (TIAZAC) 300 MG 24 hr capsule, Take 300 mg by mouth as needed., Disp: , Rfl:  .  EPINEPHrine (EPIPEN) 0.3 mg/0.3 mL SOAJ injection, Inject 0.3 mg into the muscle daily as needed (allergic reaction). , Disp: , Rfl:  .  finasteride (PROSCAR) 5 MG tablet, Take 5 mg by mouth daily., Disp: , Rfl:  .  hydrochlorothiazide (HYDRODIURIL) 25 MG tablet, Take 25 mg by mouth daily., Disp: , Rfl:  .  losartan  (COZAAR) 25 MG tablet, Take 1 tablet (25 mg total) by mouth daily., Disp: 90 tablet, Rfl: 3 .  Magnesium Hydroxide (MAGNESIA PO), Take 1,000 mg by mouth daily. , Disp: , Rfl:  .  Multiple Vitamin (MULTIVITAMIN) tablet, Take 1 tablet by mouth daily.  , Disp: , Rfl:  .  niacinamide 500 MG tablet, Take 500 mg by mouth daily., Disp: , Rfl:  .  Omega-3 Fatty Acids (FISH OIL PO), Take 1,200 mg by mouth daily., Disp: , Rfl:  .  potassium gluconate 595 (99 K) MG TABS tablet, Take 595 mg by mouth., Disp: , Rfl:  .  rosuvastatin (CRESTOR) 40 MG tablet, Take 1 tablet (40 mg total) by mouth every evening., Disp: 90 tablet, Rfl: 3 .  Tamsulosin HCl (FLOMAX) 0.4 MG CAPS, Take 0.4 mg by mouth daily.  , Disp: , Rfl:  .  vitamin C (ASCORBIC ACID) 500 MG tablet, Take 2,000 mg by mouth daily., Disp: , Rfl:   Past Medical History: Past Medical History:  Diagnosis Date  . Arthritis    HANDS AND FEET  . Asymptomatic carotid artery stenosis BILATERAL    PER DOPPLER  01-05-10   RICA  1 - 06%/   LICA  40 - 30% - followed by Dr. Donnetta Hutching  . CAD (coronary artery disease) 2002--  X2 BM STENTS  . Cataract immature BILATERAL   . Diverticulosis   . 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 History of diverticulosis    Noted on Colonoscopy  . History of inguinal herniorrhaphy   . Hydrocele, left   . Hyperlipidemia   . Hypertension   . Increased intraocular pressure   . Nocturia   . Paroxysmal atrial fibrillation ([place]) 06/30/14   chad2vasc score is at least 4  . Peripheral vascular disease ([place]) FOLLOWED BY DR EARLY-- VISIT NOTE 01-05-10 AND CAROTID / VASCULAR DOPPLER  RESULTS W/ CHART   ACTIVE WALKING PROGRAM  . 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 Retinal detachment   . Stroke [facility])     Tobacco Use: History  Smoking Status  . Former Smoker  . Years: 30.00  . Types: Cigarettes  . Quit date: 01/18/1978  Smokeless Tobacco  . Former Systems developer  . Quit date: 1980     Labs: Recent Review Flowsheet Data    Labs for ITP Cardiac and Pulmonary Rehab Latest Ref Rng & Units 12/18/2015 12/18/2015 12/18/2015 12/18/2015 12/19/2015   Cholestrol 0 - 200 mg/dL - - - - -   LDLCALC 0 - 99 mg/dL - - - - -   HDL >40 mg/dL - - - - -   Trlycerides <150 mg/dL - - - - -   Hemoglobin A1c 4.8 - 5.6 % - - - - -   PHART 7.350 - 7.450 7.460(H) 7.400 7.370 - -   PCO2ART 32.0 - 48.0 mmHg 33.7 35.9 36.4 - -   HCO3 20.0 - 28.0 mmol/L 24.3 22.3 21.1 - -   TCO2 0 - 100 mmol/L 25 23 22 23 23    ACIDBASEDEF 0.0 - 2.0 mmol/L - 2.0 4.0(H) - -   O2SAT % 99.0 98.0 98.0 - -      Capillary Blood Glucose: Lab Results  Component Value Date   GLUCAP 101 (H) 12/22/2015   GLUCAP 118 (H) 12/21/2015   GLUCAP 107 (H) 12/21/2015   GLUCAP 90 12/21/2015   GLUCAP 102 (H) 12/21/2015     Exercise Target Goals:    Exercise Program Goal: Individual exercise prescription set with THRR, safety & activity barriers. Participant demonstrates ability to understand and report RPE using BORG scale, to self-measure pulse accurately, and to acknowledge the importance of the exercise prescription.  Exercise Prescription Goal: Starting with aerobic activity 30 plus minutes a day, 3 days per week for initial exercise prescription. Provide home exercise prescription and guidelines that participant acknowledges understanding prior to discharge.  Activity Barriers & Risk Stratification:     Activity Barriers & Cardiac Risk Stratification - 02/02/16 0820      Activity Barriers & Cardiac Risk Stratification   Activity Barriers None   Cardiac Risk Stratification High      6 Minute Walk:     6 Minute Walk    Row Name 02/02/16 1207         6 Minute Walk   Phase Initial     Distance 1596 feet     Walk Time 6 minutes     # of Rest Breaks 1  rest break 17 seconds     MPH 3.34     METS 2.96     RPE 13     Perceived Dyspnea  0     VO2 Peak 10.37     Symptoms Yes (comment)     Comments  claudication pain- leg  burning      Resting HR 45 bpm     Resting BP 98/60     Max Ex. HR 75 bpm     Max Ex. BP 142/70     2 Minute Post BP 114/60        Oxygen Initial Assessment:   Oxygen Re-Evaluation:   Oxygen Discharge (Final Oxygen Re-Evaluation):   Initial Exercise Prescription:     Initial Exercise Prescription - 02/02/16 1200      Date of Initial Exercise RX and Referring Provider   Date 02/02/16   Referring Provider Fransico Him MD     Treadmill   MPH 2.5   Grade 0   Minutes 10   METs 2.91     Bike   Level 0.5   Minutes 10   METs 2.05     NuStep   Level 3   Minutes 10   METs 2     Prescription Details   Frequency (times per week) 3   Duration Progress to 30 minutes of continuous aerobic without signs/symptoms of physical distress     Intensity   THRR 40-80% of Max Heartrate 57-114   Ratings of Perceived Exertion 11-13   Perceived Dyspnea 0-4     Progression   Progression Continue progressive overload as per policy without signs/symptoms or physical distress.     Resistance Training   Training Prescription Yes   Weight 2lbs   Reps 10-12      Perform Capillary Blood Glucose checks as needed.  Exercise Prescription Changes:      Exercise Prescription Changes    Row Name 02/15/16 1100 03/14/16 1100 04/04/16 1600         Response to Exercise   Blood Pressure (Admit) 104/60 102/60 104/64     Blood Pressure (Exercise) 152/70 146/70 146/60     Blood Pressure (Exit) 108/60 110/64 122/64     Heart Rate (Admit) 53 bpm 68 bpm 64 bpm     Heart Rate (Exercise) 91 bpm 113 bpm 117 bpm     Heart Rate (Exit) 58 bpm 59 bpm 64 bpm     Rating of Perceived Exertion (Exercise) 13 12 13      Duration Progress to 45 minutes of aerobic exercise without signs/symptoms of physical distress Progress to 45 minutes of aerobic exercise without signs/symptoms of physical distress Progress to 45 minutes of aerobic exercise without signs/symptoms of physical  distress     Intensity THRR unchanged THRR unchanged THRR unchanged       Progression   Progression Continue to progress workloads to maintain intensity without signs/symptoms of physical distress. Continue to progress workloads to maintain intensity without signs/symptoms of physical distress. Continue to progress workloads to maintain intensity without signs/symptoms of physical distress.     Average METs 2.8 2.9 3.3       Resistance Training   Training Prescription Yes Yes Yes     Weight 5lb 5lb 9lb     Reps 10-12 10-15 10-15       Treadmill   MPH 2.5 2.5 2.7     Grade 0 0 0     Minutes 10 10 10      METs 2.91 2.91 3.07       Bike   Level 0.5 0.5 0.5     Minutes 10 10 10      METs 2.05 2.05 2.07       NuStep   Level 3 4 5      Minutes 10 10 10  METs 3.3 4.3 4.7       Home Exercise Plan   Plans to continue exercise at [place] (comment) Home (comment)     Frequency Add 4 additional days to program exercise sessions. Add 4 additional days to program exercise sessions. Add 4 additional days to program exercise sessions.        Exercise Comments:      Exercise Comments    Row Name 02/15/16 1202 03/16/16 1651 04/06/16 1150       Exercise Comments Pt is off to a great start with exercise  Reviewed METs and goals with pt.  Pt is doing great with exercise.  Reviewed METs and goals with pt.  Pt is doing great with exercise.         Exercise Goals and Review:      Exercise Goals    Row Name 03/16/16 1651             Exercise Goals   Increase Physical Activity Yes       Intervention Provide advice, education, support and counseling about physical activity/exercise needs.;Develop an individualized exercise prescription for aerobic and resistive training based on initial evaluation findings, risk stratification, comorbidities and participant's personal goals.       Expected Outcomes Achievement of increased cardiorespiratory fitness and enhanced flexibility,  muscular endurance and strength shown through measurements of functional capacity and personal statement of participant.       Increase Strength and Stamina Yes       Intervention Provide advice, education, support and counseling about physical activity/exercise needs.;Develop an individualized exercise prescription for aerobic and resistive training based on initial evaluation findings, risk stratification, comorbidities and participant's personal goals.       Expected Outcomes Achievement of increased cardiorespiratory fitness and enhanced flexibility, muscular endurance and strength shown through measurements of functional capacity and personal statement of participant.          Exercise Goals Re-Evaluation :     Exercise Goals Re-Evaluation    [place] Name 03/14/16 1157 04/06/16 1218           Exercise Goal Re-Evaluation   Exercise Goals Review Increase Physical Activity;Increase Strenth and Stamina Increase Physical Activity;Increase Strenth and Stamina      Comments Pt states that he is doing strength training with free weights at home 3 days/week (working most major muscle groups).  He does sit ups and walks about 2 miles everyday. Pt states he feels great and he is tolerating workload increases very well.  He is still doing strength training at home 2-3 days/week, abdominal exercises and he is walking 2.5 miles everyday.  His goal is to be able to walk a mile in 20 minutes, so he will begin working to increase his TM speed in CR from 2.6 to 3.0.      Expected Outcomes Continue with CR, HEP and increasing workloads as tolerated in order to increase strength, stamina and overall cardiorepiratory fitness.   Continue with CR, HEP and increasing workloads as tolerated in order to increase strength, stamina and overall cardiorepiratory fitness.            Discharge Exercise Prescription (Final Exercise Prescription Changes):     Exercise Prescription Changes - 04/04/16 1600      Response  to Exercise   Blood Pressure (Admit) 104/64   Blood Pressure (Exercise) 146/60   Blood Pressure (Exit) 122/64   Heart Rate (Admit) 64 bpm   Heart Rate (Exercise) 117 bpm   Heart  Rate (Exit) 64 bpm   Rating of Perceived Exertion (Exercise) 13   Duration Progress to 45 minutes of aerobic exercise without signs/symptoms of physical distress   Intensity THRR unchanged     Progression   Progression Continue to progress workloads to maintain intensity without signs/symptoms of physical distress.   Average METs 3.3     Resistance Training   Training Prescription Yes   Weight 9lb   Reps 10-15     Treadmill   MPH 2.7   Grade 0   Minutes 10   METs 3.07     Bike   Level 0.5   Minutes 10   METs 2.07     NuStep   Level 5   Minutes 10   METs 4.7     Home Exercise Plan   Plans to continue exercise at Home (comment)   Frequency Add 4 additional days to program exercise sessions.      Nutrition:  Target Goals: Understanding of nutrition guidelines, daily intake of sodium 1500mg , cholesterol 200mg , calories 30% from fat and 7% or less from saturated fats, daily to have 5 or more servings of fruits and vegetables.  Biometrics:     Pre Biometrics - 02/02/16 1214      Pre Biometrics   Weight 195 lb 8.8 oz (88.7 kg)   Waist Circumference 38 inches   Hip Circumference 41 inches   Waist to Hip Ratio 0.93 %   Triceps Skinfold 8 mm   % Body Fat 23.7 %   Grip Strength 45 kg   Flexibility 7.5 in   Single Leg Stand 2.21 seconds       Nutrition Therapy Plan and Nutrition Goals:     Nutrition Therapy & Goals - 02/08/16 1055      Nutrition Therapy   Diet Therapeutic Lifestyle Changes     Personal Nutrition Goals   Nutrition Goal Maintain current wt while in Cardiac Rehab     Intervention Plan   Intervention Prescribe, educate and counsel regarding individualized specific dietary modifications aiming towards targeted core components such as weight, hypertension, lipid  management, diabetes, heart failure and other comorbidities.   Expected Outcomes Short Term Goal: Understand basic principles of dietary content, such as calories, fat, sodium, cholesterol and nutrients.;Long Term Goal: Adherence to prescribed nutrition plan.      Nutrition Discharge: Nutrition Scores:     Nutrition Assessments - 02/15/16 1130      MEDFICTS Scores   Pre Score 21      Nutrition Goals Re-Evaluation:   Nutrition Goals Re-Evaluation:   Nutrition Goals Discharge (Final Nutrition Goals Re-Evaluation):   Psychosocial: Target Goals: Acknowledge presence or absence of significant depression and/or stress, maximize coping skills, provide positive support system. Participant is able to verbalize types and ability to use techniques and skills needed for reducing stress and depression.  Initial Review & Psychosocial Screening:     Initial Psych Review & Screening - 02/02/16 1150      Family Dynamics   Good Support System? Yes   Comments Brief assessment reveals no idetifiable barriers or interventions needed at this time     Barriers   Psychosocial barriers to participate in program There are no identifiable barriers or psychosocial needs.     Screening Interventions   Interventions Encouraged to exercise      Quality of Life Scores:     Quality of Life - 02/02/16 1216      Quality of Life Scores   Health/Function Pre 26.27 %  Socioeconomic Pre 26.67 %   Psych/Spiritual Pre 28.57 %   Family Pre 28.8 %   GLOBAL Pre 27.21 %      PHQ-9: Recent Review Flowsheet Data    Depression screen [facility] 2/9 02/08/2016   Decreased Interest 0   Down, Depressed, Hopeless 0   PHQ - 2 Score 0     Interpretation of Total Score  Total Score Depression Severity:  1-4 = Minimal depression, 5-9 = Mild depression, 10-14 = Moderate depression, 15-19 = Moderately severe depression, 20-27 = Severe depression   Psychosocial Evaluation and Intervention:   Psychosocial  Re-Evaluation:     Psychosocial Re-Evaluation    [place] Name 02/15/16 1216 03/15/16 1257 04/12/16 1308         Psychosocial Re-Evaluation   Current issues with  - None Identified None Identified     Interventions Encouraged to attend Cardiac Rehabilitation for the exercise Encouraged to attend Cardiac Rehabilitation for the exercise Encouraged to attend Cardiac Rehabilitation for the exercise;Stress management education     Continue Psychosocial Services  No No Follow up required No Follow up required        Psychosocial Discharge (Final Psychosocial Re-Evaluation):     Psychosocial Re-Evaluation - 04/12/16 1308      Psychosocial Re-Evaluation   Current issues with None Identified   Interventions Encouraged to attend Cardiac Rehabilitation for the exercise;Stress management education   Continue Psychosocial Services  No Follow up required      Vocational Rehabilitation: Provide vocational rehab assistance to qualifying candidates.   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 Participant will state understanding/return demonstration of topics presented.  Learning Barriers/Preferences:     Learning Barriers/Preferences - 02/02/16 0820      Learning Barriers/Preferences   Learning Barriers Sight   Learning Preferences Written Material;Computer/Internet      Education Topics: Count Your Pulse:  -Group instruction provided by verbal instruction, demonstration, patient participation and written materials to support subject.  Instructors address importance of being able to find your pulse and how to count your pulse when at home without a heart monitor.  Patients get hands on experience  counting their pulse with staff help and individually.   Heart Attack, Angina, and Risk Factor Modification:  -Group instruction provided by verbal instruction, video, and written materials to support subject.  Instructors address signs and symptoms of angina and heart attacks.    Also discuss risk factors for heart disease and how to make changes to improve heart health risk factors.   Functional Fitness:  -Group instruction provided by verbal instruction, demonstration, patient participation, and written materials to support subject.  Instructors address safety measures for doing things around the house.  Discuss how to get up and down off the floor, how to pick things up properly, how to safely get out of a chair without assistance, and balance training.   Meditation and Mindfulness:  -Group instruction provided by verbal instruction, patient participation, and written materials to support subject.  Instructor addresses importance of mindfulness and meditation practice to help reduce stress and improve awareness.  Instructor also leads participants through a meditation exercise.    Stretching for Flexibility and Mobility:  -Group instruction provided by  verbal instruction, patient participation, and written materials to support subject.  Instructors lead participants through series of stretches that are designed to increase flexibility thus improving mobility.  These stretches are additional exercise for major muscle groups that are typically performed during regular warm up and cool down.   CARDIAC REHAB PHASE II EXERCISE from 03/23/2016 in [place]  Date  02/12/16  Instruction Review Code  2- meets goals/outcomes      Hands Only CPR Anytime:  -Group instruction provided by verbal instruction, video, patient participation and written materials to support subject.  Instructors co-teach with AHA video for hands only CPR.  Participants get hands on experience  with mannequins.   Nutrition I class: Heart Healthy Eating:  -Group instruction provided by PowerPoint slides, verbal discussion, and written materials to support subject matter. The instructor gives an explanation and review of the Therapeutic Lifestyle Changes diet recommendations, which includes a discussion on lipid goals, dietary fat, sodium, fiber, plant stanol/sterol esters, sugar, and the components of a well-balanced, healthy diet.   CARDIAC REHAB PHASE II EXERCISE from 03/23/2016 in [place]  Date  02/23/16  Educator  RD  Instruction Review Code  2- meets goals/outcomes      Nutrition II class: Lifestyle Skills:  -Group instruction provided by PowerPoint slides, verbal discussion, and written materials to support subject matter. The instructor gives an explanation and review of label reading, grocery shopping for heart health, heart healthy recipe modifications, and ways to make healthier choices when eating out.   CARDIAC REHAB PHASE II EXERCISE from 03/23/2016 in [place]  Date  03/01/16  Educator  RD  Instruction Review Code  2- meets goals/outcomes      Diabetes Question & Answer:  -Group instruction provided by PowerPoint slides, verbal discussion, and written materials to support subject matter. The instructor gives an explanation and review of diabetes co-morbidities, pre- and post-prandial blood glucose goals, pre-exercise blood glucose goals, signs, symptoms, and treatment of hypoglycemia and hyperglycemia, and foot care basics.   Diabetes Blitz:  -Group instruction provided by PowerPoint slides, verbal discussion, and written materials to support subject matter. The instructor gives an explanation and review of the physiology behind type 1 and type 2 diabetes, diabetes medications and rational behind using different medications, pre- and post-prandial blood glucose recommendations and Hemoglobin A1c goals,  diabetes diet, and exercise including blood glucose guidelines for exercising safely.    Portion Distortion:  -Group instruction provided by PowerPoint slides, verbal discussion, written materials, and food models to support subject matter. The instructor gives an explanation of serving size versus portion size, changes in portions sizes over the last 20 years, and what consists of a serving from each food group.   Stress Management:  -Group instruction provided by verbal instruction, video, and written materials to support subject matter.  Instructors review role of stress in heart disease and how to cope with stress positively.     CARDIAC REHAB PHASE II EXERCISE from 03/23/2016 in [place]  Date  03/23/16  Instruction Review Code  2- meets goals/outcomes      Exercising on Your Own:  -Group instruction provided by verbal instruction, power point, and written materials to support subject.  Instructors discuss benefits of exercise, components of exercise, frequency and intensity of exercise, and end points for exercise.  Also discuss use of nitroglycerin and activating EMS.  Review options of places to exercise  outside of rehab.  Review guidelines for sex with heart disease.   Cardiac Drugs I:  -Group instruction provided by verbal instruction and written materials to support subject.  Instructor reviews cardiac drug classes: antiplatelets, anticoagulants, beta blockers, and statins.  Instructor discusses reasons, side effects, and lifestyle considerations for each drug class.   Cardiac Drugs II:  -Group instruction provided by verbal instruction and written materials to support subject.  Instructor reviews cardiac drug classes: angiotensin converting enzyme inhibitors (ACE-I), angiotensin II receptor blockers (ARBs), nitrates, and calcium channel blockers.  Instructor discusses reasons, side effects, and lifestyle considerations for each drug  class.   Anatomy and Physiology of the Circulatory System:  -Group instruction provided by verbal instruction, video, and written materials to support subject.  Reviews functional anatomy of heart, how it relates to various diagnoses, and what role the heart plays in the overall system.   CARDIAC REHAB PHASE II EXERCISE from 03/23/2016 in [place]  Date  02/24/16  Instruction Review Code  2- meets goals/outcomes      Knowledge Questionnaire Score:   Core Components/Risk Factors/Patient Goals at Admission:     Personal Goals and Risk Factors at Admission - 02/02/16 1429      Core Components/Risk Factors/Patient Goals on Admission    Weight Management Yes;Weight Loss   Intervention Weight Management: Develop a combined nutrition and exercise program designed to reach desired caloric intake, while maintaining appropriate intake of nutrient and fiber, sodium and fats, and appropriate energy expenditure required for the weight goal.;Weight Management: Provide education and appropriate resources to help participant work on and attain dietary goals.;Obesity: Provide education and appropriate resources to help participant work on and attain dietary goals.   Expected Outcomes Short Term: Continue to assess and modify interventions until short term weight is achieved;Long Term: Adherence to nutrition and physical activity/exercise program aimed toward attainment of established weight goal;Weight Maintenance: Understanding of the daily nutrition guidelines, which includes 25-35% calories from fat, 7% or less cal from saturated fats, less than 200mg  cholesterol, less than 1.5gm of sodium, & 5 or more servings of fruits and vegetables daily;Weight Loss: Understanding of general recommendations for a balanced deficit meal plan, which promotes 1-2 lb weight loss per week and includes a negative energy balance of [phone] kcal/d   Sedentary Yes   Intervention Provide advice,  education, support and counseling about physical activity/exercise needs.;Develop an individualized exercise prescription for aerobic and resistive training based on initial evaluation findings, risk stratification, comorbidities and participant's personal goals.   Expected Outcomes Achievement of increased cardiorespiratory fitness and enhanced flexibility, muscular endurance and strength shown through measurements of functional capacity and personal statement of participant.   Increase Strength and Stamina Yes   Intervention Provide advice, education, support and counseling about physical activity/exercise needs.;Develop an individualized exercise prescription for aerobic and resistive training based on initial evaluation findings, risk stratification, comorbidities and participant's personal goals.   Expected Outcomes Achievement of increased cardiorespiratory fitness and enhanced flexibility, muscular endurance and strength shown through measurements of functional capacity and personal statement of participant.   Hypertension Yes   Intervention Provide education on lifestyle modifcations including regular physical activity/exercise, weight management, moderate sodium restriction and increased consumption of fresh fruit, vegetables, and low fat dairy, alcohol moderation, and smoking cessation.;Monitor prescription use compliance.   Expected Outcomes Short Term: Continued assessment and intervention until BP is < 140/39mm HG in hypertensive participants. < 130/6mm HG in hypertensive participants with diabetes, heart failure or chronic  kidney disease.;Long Term: Maintenance of blood pressure at goal levels.   Lipids Yes   Intervention Provide education and support for participant on nutrition & aerobic/resistive exercise along with prescribed medications to achieve LDL 70mg , HDL >40mg .   Expected Outcomes Short Term: Participant states understanding of desired cholesterol values and is compliant with  medications prescribed. Participant is following exercise prescription and nutrition guidelines.;Long Term: Cholesterol controlled with medications as prescribed, with individualized exercise RX and with personalized nutrition plan. Value goals: LDL < 70mg , HDL > 40 mg.   Personal Goal Other Yes   Personal Goal Increase stamina and flexibility, and be able to play golf   Intervention Provide exercise programming and provide education on stretching, ROM and functional mobility to assist with improving functional mobility/flexibility.    Expected Outcomes Pt will attend cardiac education classes and participate in active and passive streching. Pt will also be able to return to golf without diffculty.      Core Components/Risk Factors/Patient Goals Review:      Goals and Risk Factor Review    Row Name 02/15/16 1202             Core Components/Risk Factors/Patient Goals Review   Personal Goals Review Increase Strength and Stamina;Other       Review Reviewed HEP with pt.  Pt stated he is already walking at home on his off days from [facility] 15-29min/day.  I encouraged him to continue walking and increase his time 1-3 min. each day until he is exercising continuously for 30-45 minutes w/o any symptoms       Expected Outcomes Pt will increase strength and stamina, have less fatigue and improve overall cardiorespiratory fitness.           Core Components/Risk Factors/Patient Goals at Discharge (Final Review):      Goals and Risk Factor Review - 02/15/16 1202      Core Components/Risk Factors/Patient Goals Review   Personal Goals Review Increase Strength and Stamina;Other   Review Reviewed HEP with pt.  Pt stated he is already walking at home on his off days from [facility] 15-61min/day.  I encouraged him to continue walking and increase his time 1-3 min. each day until he is exercising continuously for 30-45 minutes w/o any symptoms   Expected Outcomes Pt will increase strength and stamina, have  less fatigue and improve overall cardiorespiratory fitness.       ITP Comments:     ITP Comments    Row Name 02/02/16 0818           ITP Comments Dr. Fransico Him, Medical Director          Comments: Wille Glaser  is making expected progress toward personal goals after completing 28 sessions. Recommend continued exercise and life style modification education including  stress management and relaxation techniques to decrease cardiac risk profile. Wille Glaser is doing well with exercise both at cardiac rehab and at home.Barnet Pall, RN,BSN 04/13/2016 12:12 PM

## 2016-04-13 ENCOUNTER — Encounter (HOSPITAL_COMMUNITY)
Admission: RE | Admit: 2016-04-13 | Discharge: 2016-04-13 | Disposition: A | Discharge status: Home / Self Care | Payer: PPO | Source: Ambulatory Visit | Attending: Cardiology | Admitting: Cardiology

## 2016-04-13 ENCOUNTER — Encounter (HOSPITAL_COMMUNITY): Payer: PPO

## 2016-04-13 DIAGNOSIS — Z951 Presence of aortocoronary bypass graft: Secondary | ICD-10-CM

## 2016-04-13 DIAGNOSIS — I214 Non-ST elevation (NSTEMI) myocardial infarction: Secondary | ICD-10-CM | POA: Diagnosis not present

## 2016-04-13 NOTE — Addendum Note (Signed)
Addended by: Lianne Cure A on: 04/13/2016 09:50 AM   Modules accepted: Orders

## 2016-04-15 ENCOUNTER — Encounter (HOSPITAL_COMMUNITY): Payer: PPO

## 2016-04-18 ENCOUNTER — Encounter (HOSPITAL_COMMUNITY): Payer: PPO

## 2016-04-18 ENCOUNTER — Encounter (HOSPITAL_COMMUNITY)
Admission: RE | Admit: 2016-04-18 | Discharge: 2016-04-18 | Disposition: A | Discharge status: Home / Self Care | Payer: PPO | Source: Ambulatory Visit | Attending: Cardiology | Admitting: Cardiology

## 2016-04-18 DIAGNOSIS — Z951 Presence of aortocoronary bypass graft: Secondary | ICD-10-CM | POA: Diagnosis not present

## 2016-04-18 DIAGNOSIS — I214 Non-ST elevation (NSTEMI) myocardial infarction: Secondary | ICD-10-CM | POA: Diagnosis not present

## 2016-04-20 ENCOUNTER — Encounter (HOSPITAL_COMMUNITY)
Admission: RE | Admit: 2016-04-20 | Discharge: 2016-04-20 | Disposition: A | Discharge status: Home / Self Care | Payer: PPO | Source: Ambulatory Visit | Attending: Cardiology | Admitting: Cardiology

## 2016-04-20 ENCOUNTER — Encounter (HOSPITAL_COMMUNITY): Payer: PPO

## 2016-04-20 DIAGNOSIS — I214 Non-ST elevation (NSTEMI) myocardial infarction: Secondary | ICD-10-CM | POA: Diagnosis not present

## 2016-04-20 DIAGNOSIS — Z951 Presence of aortocoronary bypass graft: Secondary | ICD-10-CM

## 2016-04-21 ENCOUNTER — Encounter: Payer: Self-pay | Admitting: Cardiology

## 2016-04-21 ENCOUNTER — Ambulatory Visit (INDEPENDENT_AMBULATORY_CARE_PROVIDER_SITE_OTHER): Payer: PPO | Admitting: Cardiology

## 2016-04-21 VITALS — BP 148/72 | HR 56 | Ht 71.5 in | Wt 196.4 lb

## 2016-04-21 DIAGNOSIS — I2510 Atherosclerotic heart disease of native coronary artery without angina pectoris: Secondary | ICD-10-CM | POA: Diagnosis not present

## 2016-04-21 DIAGNOSIS — I6523 Occlusion and stenosis of bilateral carotid arteries: Secondary | ICD-10-CM | POA: Diagnosis not present

## 2016-04-21 DIAGNOSIS — I481 Persistent atrial fibrillation: Secondary | ICD-10-CM | POA: Diagnosis not present

## 2016-04-21 DIAGNOSIS — E7800 Pure hypercholesterolemia, unspecified: Secondary | ICD-10-CM

## 2016-04-21 DIAGNOSIS — I10 Essential (primary) hypertension: Secondary | ICD-10-CM | POA: Diagnosis not present

## 2016-04-21 DIAGNOSIS — I4819 Other persistent atrial fibrillation: Secondary | ICD-10-CM

## 2016-04-21 LAB — TSH: TSH: 2.29 u[IU]/mL (ref 0.450–4.500)

## 2016-04-21 NOTE — Progress Notes (Signed)
Cardiology Office Note    Date:  04/21/2016   ID:  [name], DOB March 14, 1940, MRN [id number]  PCP:  Henrine Screws, MD  Cardiologist:  Fransico Him, MD   Chief Complaint  Patient presents with  . Coronary Artery Disease  . Hypertension  . Hyperlipidemia  . Atrial Fibrillation    History of Present Illness:  [name] is a 76 y.o. male with a history of CAD (s/p CABG with LIMA to LAD, SVG to RCA and SVG to LCx 12/2015), HTN, dyslipidemia, PAF on chronic anticoagulation with Eliquis, carotid artery stenosis s/p  L CEA, AAA s/p repair and followed by Dr. Donnetta Hutching.  He had post op afib and was placed on Amio post op from CABG.  He is here today for followup and is doing well. He denies any chest pain, SOB, DOE, LE edema, dizziness or syncope. He is participating in cardiac rehab and working out in his yard with heavy physical work.     Past Medical History:  Diagnosis Date  . Arthritis    HANDS AND FEET  . Asymptomatic carotid artery stenosis BILATERAL   PER DOPPLER  03/2016  RICA  1 - 39%/  s/p  L CEA  1-39%- followed by Dr. Donnetta Hutching  . CAD (coronary artery disease)    s/p remote stenting, s/p CABG with LIMA to LAD, SVG to RCA and SVG to LCx 12/2015  . Cataract immature BILATERAL   . Diverticulosis   . 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 History of diverticulosis    Noted on Colonoscopy  . History of inguinal herniorrhaphy   . Hydrocele, left   . Hyperlipidemia   . Hypertension   . Increased intraocular pressure   . Nocturia   . Paroxysmal atrial fibrillation ([place]) 06/30/14   chad2vasc score is at least 4  . 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 Retinal detachment   . Stroke [facility])     Past Surgical History:  Procedure Laterality Date  . AAA REPAIR W/ AORTOBIFEMORAL BYPASS  OCT  70177  . ABDOMINAL HERNIA REPAIR  X4  (LAST ONE 1990'S)   inguinal herniorrhaphy  . CARDIAC CATHETERIZATION N/A 12/16/2015   Procedure: Left Heart Cath and Coronary Angiography;  Surgeon: Wellington Hampshire, MD;  Location: [place] [facility];  Service: Cardiovascular;  Laterality: N/A;  . CAROTID ENDARTERECTOMY    . CARPAL TUNNEL RELEASE Right Oct. 2013   Hand  . CORONARY ANGIOPLASTY  2002   PTCA  W/ X2 BM STENTS--   PROXIMA LAD  &  LEFT CIRCUMFLEX TO FIRST OBTUSE MARGINAL BRANCH  . CORONARY ARTERY BYPASS GRAFT N/A 12/18/2015   Procedure: CORONARY ARTERY BYPASS GRAFTING (CABG) x 3, using left internal mammary artery and bilateral   leg greater saphenous vein harvested endoscopically  LIMA to LAD, SVG to Circumflex, SVG to RCA;  Surgeon: Grace Isaac, MD;  Location: [place];  Service: Open Heart Surgery;  Laterality: N/A;  . ENDARTERECTOMY Left 03/11/2015   Procedure: ENDARTERECTOMY LEFT CAROTID;  Surgeon: Rosetta Posner, MD;  Location: [place];  Service: Vascular;  Laterality: Left;  . EXPLORATORY LAPAROTOMY W/ ENTEROLYSIS FOR PARTIAL SBO  09-23-2008  . heart stents  02/19/00  . HYDROCELE EXCISION  01/24/2011   Procedure: HYDROCELECTOMY ADULT;  Surgeon: Bernestine Amass, MD;  Location: [facility];  Service: Urology;  Laterality: Left;  . Ingrown Toe Nail Right Jan. 2015   Right  Great Toe nail  . intest blockage  09/22/08  . PATCH ANGIOPLASTY Left 03/11/2015   Procedure: PATCH ANGIOPLASTY LEFT CAROTID USING HEMASHIELD PLATINUM FINESSE PATCH;  Surgeon: Rosetta Posner, MD;  Location: [place];  Service: Vascular;  Laterality: Left;  . PULLEY RELEASE -- RIGHT 5TH FINGER  2009  . RETINAL DETACHMENT SURGERY  2004   BILATERAL  . TEE WITHOUT CARDIOVERSION N/A 12/18/2015   Procedure: TRANSESOPHAGEAL ECHOCARDIOGRAM (TEE);  Surgeon: Grace Isaac, MD;  Location: [place];  Service: Open Heart Surgery;  Laterality: N/A;  . TOOTH EXTRACTION  Dec 2013    Current Medications: Current Meds  Medication Sig  . acetaminophen (TYLENOL) 500 MG tablet Take 2 tablets (1,000 mg total) by mouth every 6 (six) hours as needed.  [name]  amiodarone (PACERONE) 200 MG tablet Take 1 tablet (200 mg total) by mouth daily.  [name] apixaban (ELIQUIS) 5 MG TABS tablet Take 1 tablet (5 mg total) by mouth 2 (two) times daily.  [name] aspirin EC 81 MG tablet Take 81 mg by mouth daily.  [name] b complex vitamins tablet Take 1 tablet by mouth daily.    . calcium carbonate (OS-CAL) 600 MG TABS Take 600 mg by mouth daily.    . Cholecalciferol 1000 UNITS capsule Take 2,000 Units by mouth daily.   . Coenzyme Q10 (CO Q 10) 100 MG CAPS Take 1 capsule by mouth daily.  . cyanocobalamin 1000 MCG tablet Take 1,000 mcg by mouth daily.  [name] diltiazem (TIAZAC) 300 MG 24 hr capsule Take 300 mg by mouth as needed.  [name] EPINEPHrine (EPIPEN) 0.3 mg/0.3 mL SOAJ injection Inject 0.3 mg into the muscle daily as needed (allergic reaction).   . finasteride (PROSCAR) 5 MG tablet Take 5 mg by mouth daily.  . hydrochlorothiazide (HYDRODIURIL) 25 MG tablet Take 25 mg by mouth daily.  [name] losartan (COZAAR) 25 MG tablet Take 1 tablet (25 mg total) by mouth daily.  . Magnesium Hydroxide (MAGNESIA PO) Take 1,000 mg by mouth daily.   . Multiple Vitamin (MULTIVITAMIN) tablet Take 1 tablet by mouth daily.    . Omega-3 Fatty Acids (FISH OIL PO) Take 1,200 mg by mouth daily.  . potassium gluconate 595 (99 K) MG TABS tablet Take 595 mg by mouth.  . rosuvastatin (CRESTOR) 40 MG tablet Take 1 tablet (40 mg total) by mouth every evening.  . Tamsulosin HCl (FLOMAX) 0.4 MG CAPS Take 0.4 mg by mouth daily.    . vitamin C (ASCORBIC ACID) 500 MG tablet Take 2,000 mg by mouth daily.    Allergies:   Wasp venom   Social History   Social History  . Marital status: Married    Spouse name: N/A  . Number of children: N/A  . Years of education: N/A   Social History Main Topics  . Smoking status: Former Smoker    Years: 30.00    Types: Cigarettes    Quit date: 01/18/1978  . Smokeless tobacco: Former Systems developer    Quit date: 1980  . Alcohol use 13.2 oz/week    1 Shots of liquor, 21 Standard drinks or  equivalent per week     Comment: 2 oz of vodka per day  . Drug use: No  . Sexual activity: Not Asked   Other Topics Concern  . None   Social History Narrative   Pt lives in [place] with spouse.  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     Family History:  The patient's family history  includes Cancer (age of onset: 37) in his mother; Heart disease in his mother; Heart failure in his father; Hyperlipidemia in his mother.   ROS:   Please see the history of present illness.    ROS All other systems reviewed and are negative.  No flowsheet data found.     PHYSICAL EXAM:   VS:  BP (!) 148/72   Pulse (!) 56   Ht 5' 11.5" (1.816 m)   Wt 196 lb 6.4 oz (89.1 kg)   SpO2 97%   BMI 27.01 kg/m    GEN: Well nourished, well developed, in no acute distress  HEENT: normal  Neck: no JVD, carotid bruits, or masses Cardiac: RRR; no murmurs, rubs, or gallops,no edema.  Intact distal pulses bilaterally.  Respiratory:  clear to auscultation bilaterally, normal work of breathing GI: soft, nontender, nondistended, + BS MS: no deformity or atrophy  Skin: warm and dry, no rash Neuro:  Alert and Oriented x 3, Strength and sensation are intact Psych: euthymic mood, full affect  Wt Readings from Last 3 Encounters:  04/21/16 196 lb 6.4 oz (89.1 kg)  04/05/16 190 lb (86.2 kg)  02/02/16 195 lb 8.8 oz (88.7 kg)      Studies/Labs Reviewed:   EKG:  EKG is not ordered today.  Recent Labs: 12/17/2015: ALT 15 12/19/2015: Magnesium 2.2 12/21/2015: BUN 11; Creatinine, Ser 0.95; Hemoglobin 8.4; Platelets 164; Potassium 3.7; Sodium 134   Lipid Panel    Component Value Date/Time   CHOL 149 12/16/2015 0218   CHOL 164 05/24/2013 0916   TRIG 74 12/16/2015 0218   TRIG 50 05/24/2013 0916   HDL 61 12/16/2015 0218   HDL 84 05/24/2013 0916   CHOLHDL 2.4 12/16/2015 0218   VLDL 15 12/16/2015 0218   LDLCALC 73 12/16/2015 0218   LDLCALC 70 05/24/2013 0916    Additional studies/  records that were reviewed today include:  none    ASSESSMENT:    1. Atherosclerosis of native coronary artery of native heart without angina pectoris   2. Persistent atrial fibrillation ([place])   3. Bilateral carotid artery stenosis   4. Essential hypertension   5. Pure hypercholesterolemia      PLAN:  In order of problems listed above:  1. ASCAD s/p remote PCI in 2002. He has no anginal symptoms.  Continue statin/ASA.   2. Persistent atrial fibrillation - he is maintaining NSR.  He will continue on Amio, Cardizem and Apixaban. Creatinine was 0.95 in 12/2015.  Hbg 8.4.  I will repeat CBC.  I will check a TSH. LFTs were normal 11/2015.  I have encouraged him to get a yearly eye exam.   3. Bilateral carotid artery stenosis s/p L CEA (1-39% bilateral stenosis on dopplers 03/2016) - followed by Dr. Donnetta Hutching.  Continue statin.   4. HTN - BP borderline controlled today but he brought his BP readings from cardiac rehab which are normal.  He will continue on Cardizem, ARB and diuretic.  5. Hyperlipidemia - LDL goal < 70.  He will continue on statin.  LDL was 73 in  11/2015. 6. AAA s/p aortobifem bypass and followed by Dr. Donnetta Hutching.     Medication Adjustments/Labs and Tests Ordered: Current medicines are reviewed at length with the patient today.  Concerns regarding medicines are outlined above.  Medication changes, Labs and Tests ordered today are listed in the Patient Instructions below.  There are no Patient Instructions on file for this visit.   Signed, Fransico Him, MD  04/21/2016 8:28  AM    [facility] [address], [place], [place]  [address] Phone: [phone]; Fax: [phone]

## 2016-04-21 NOTE — Patient Instructions (Signed)
Medication Instructions:  Your physician recommends that you continue on your current medications as directed. Please refer to the Current Medication list given to you today.   Labwork: TODAY: TSH  FASTING labs in 3 MONTHS: TSH, LFTs, Lipids  Testing/Procedures: None  Follow-Up: Your physician wants you to follow-up in: 6 months with Dr. Radford Pax. You will receive a reminder letter in the mail two months in advance. If you don't receive a letter, please call our office to schedule the follow-up appointment.   Any Other Special Instructions Will Be Listed Below (If Applicable).     If you need a refill on your cardiac medications before your next appointment, please call your pharmacy.

## 2016-04-22 ENCOUNTER — Encounter (HOSPITAL_COMMUNITY)
Admission: RE | Admit: 2016-04-22 | Discharge: 2016-04-22 | Disposition: A | Discharge status: Home / Self Care | Payer: PPO | Source: Ambulatory Visit | Attending: Cardiology | Admitting: Cardiology

## 2016-04-22 ENCOUNTER — Encounter (HOSPITAL_COMMUNITY): Payer: PPO

## 2016-04-22 DIAGNOSIS — Z951 Presence of aortocoronary bypass graft: Secondary | ICD-10-CM

## 2016-04-22 DIAGNOSIS — I214 Non-ST elevation (NSTEMI) myocardial infarction: Secondary | ICD-10-CM

## 2016-04-25 ENCOUNTER — Encounter (HOSPITAL_COMMUNITY): Payer: PPO

## 2016-04-25 ENCOUNTER — Encounter (HOSPITAL_COMMUNITY)
Admission: RE | Admit: 2016-04-25 | Discharge: 2016-04-25 | Disposition: A | Discharge status: Home / Self Care | Payer: PPO | Source: Ambulatory Visit | Attending: Cardiology | Admitting: Cardiology

## 2016-04-25 DIAGNOSIS — I214 Non-ST elevation (NSTEMI) myocardial infarction: Secondary | ICD-10-CM

## 2016-04-25 DIAGNOSIS — Z951 Presence of aortocoronary bypass graft: Secondary | ICD-10-CM

## 2016-04-27 ENCOUNTER — Encounter (HOSPITAL_COMMUNITY): Payer: PPO

## 2016-04-27 ENCOUNTER — Encounter (HOSPITAL_COMMUNITY)
Admission: RE | Admit: 2016-04-27 | Discharge: 2016-04-27 | Disposition: A | Discharge status: Home / Self Care | Payer: PPO | Source: Ambulatory Visit | Attending: Cardiology | Admitting: Cardiology

## 2016-04-27 DIAGNOSIS — I214 Non-ST elevation (NSTEMI) myocardial infarction: Secondary | ICD-10-CM

## 2016-04-27 DIAGNOSIS — Z951 Presence of aortocoronary bypass graft: Secondary | ICD-10-CM

## 2016-04-29 ENCOUNTER — Encounter (HOSPITAL_COMMUNITY)
Admission: RE | Admit: 2016-04-29 | Discharge: 2016-04-29 | Disposition: A | Discharge status: Home / Self Care | Payer: PPO | Source: Ambulatory Visit | Attending: Cardiology | Admitting: Cardiology

## 2016-04-29 ENCOUNTER — Encounter (HOSPITAL_COMMUNITY): Payer: PPO

## 2016-04-29 DIAGNOSIS — I214 Non-ST elevation (NSTEMI) myocardial infarction: Secondary | ICD-10-CM

## 2016-04-29 DIAGNOSIS — Z951 Presence of aortocoronary bypass graft: Secondary | ICD-10-CM

## 2016-05-02 ENCOUNTER — Encounter (HOSPITAL_COMMUNITY): Payer: PPO

## 2016-05-02 ENCOUNTER — Encounter (HOSPITAL_COMMUNITY)
Admission: RE | Admit: 2016-05-02 | Discharge: 2016-05-02 | Disposition: A | Discharge status: Home / Self Care | Payer: PPO | Source: Ambulatory Visit | Attending: Cardiology | Admitting: Cardiology

## 2016-05-02 VITALS — Ht 71.5 in | Wt 192.7 lb

## 2016-05-02 DIAGNOSIS — Z951 Presence of aortocoronary bypass graft: Secondary | ICD-10-CM

## 2016-05-02 DIAGNOSIS — I214 Non-ST elevation (NSTEMI) myocardial infarction: Secondary | ICD-10-CM | POA: Diagnosis not present

## 2016-05-02 NOTE — Progress Notes (Signed)
Discharge Summary  Patient Details  Name: [name] MRN: [id number] Date of Birth: 1940/12/23 Referring Provider:     [place] from 02/02/2016 in [place]  Referring Provider  Fransico Him MD       Number of Visits: 36  Reason for Discharge:  Patient independent in their exercise.  Smoking History:  History  Smoking Status  . Former Smoker  . Years: 30.00  . Types: Cigarettes  . Quit date: 01/18/1978  Smokeless Tobacco  . Former Systems developer  . Quit date: 1980    Diagnosis:  12/18/15 S/P CABG x3  12/15/15 NSTEMI (non-ST elevated myocardial infarction) [facility])  ADL UCSD:   Initial Exercise Prescription:     Initial Exercise Prescription - 02/02/16 1200      Date of Initial Exercise RX and Referring Provider   Date 02/02/16   Referring Provider Fransico Him MD     Treadmill   MPH 2.5   Grade 0   Minutes 10   METs 2.91     Bike   Level 0.5   Minutes 10   METs 2.05     NuStep   Level 3   Minutes 10   METs 2     Prescription Details   Frequency (times per week) 3   Duration Progress to 30 minutes of continuous aerobic without signs/symptoms of physical distress     Intensity   THRR 40-80% of Max Heartrate 57-114   Ratings of Perceived Exertion 11-13   Perceived Dyspnea 0-4     Progression   Progression Continue progressive overload as per policy without signs/symptoms or physical distress.     Resistance Training   Training Prescription Yes   Weight 2lbs   Reps 10-12      Discharge Exercise Prescription (Final Exercise Prescription Changes):     Exercise Prescription Changes - 04/04/16 1600      Response to Exercise   Blood Pressure (Admit) 104/64   Blood Pressure (Exercise) 146/60   Blood Pressure (Exit) 122/64   Heart Rate (Admit) 64 bpm   Heart Rate (Exercise) 117 bpm   Heart Rate (Exit) 64 bpm   Rating of Perceived Exertion (Exercise) 13   Duration Progress to 45 minutes  of aerobic exercise without signs/symptoms of physical distress   Intensity THRR unchanged     Progression   Progression Continue to progress workloads to maintain intensity without signs/symptoms of physical distress.   Average METs 3.3     Resistance Training   Training Prescription Yes   Weight 9lb   Reps 10-15     Treadmill   MPH 2.7   Grade 0   Minutes 10   METs 3.07     Bike   Level 0.5   Minutes 10   METs 2.07     NuStep   Level 5   Minutes 10   METs 4.7     Home Exercise Plan   Plans to continue exercise at Home (comment)   Frequency Add 4 additional days to program exercise sessions.      Functional Capacity:     6 Minute Walk    Row Name 02/02/16 1207 04/18/16 1108       6 Minute Walk   Phase Initial Discharge    Distance 1596 feet 2000 feet    Distance % Change  - 25 %    Walk Time 6 minutes 6 minutes    # of Rest Breaks  1  rest break 17 seconds 0    MPH 3.34 3.8    METS 2.96 4.2    RPE 13 15    Perceived Dyspnea  0 0    VO2 Peak 10.37 14.9    Symptoms Yes (comment) No    Comments claudication pain- leg burning   -    Resting HR 45 bpm 65 bpm    Resting BP 98/60 144/60    Max Ex. HR 75 bpm 122 bpm    Max Ex. BP 142/70 148/64    2 Minute Post BP 114/60 102/64       Psychological, QOL, Others - Outcomes: PHQ 2/9: Depression screen [facility] 2/9 05/02/2016 02/08/2016  Decreased Interest 0 0  Down, Depressed, Hopeless 0 0  PHQ - 2 Score 0 0    Quality of Life:     Quality of Life - 04/22/16 1411      Quality of Life Scores   Health/Function Pre 26.27 %   Health/Function Post 25.57 %   Health/Function % Change -2.66 %   Socioeconomic Pre 26.67 %   Socioeconomic Post 25.58 %   Socioeconomic % Change  -4.09 %   Psych/Spiritual Pre 28.57 %   Psych/Spiritual Post 27.5 %   Psych/Spiritual % Change -3.75 %   Family Pre 28.8 %   Family Post 26.4 %   Family % Change -8.33 %   GLOBAL Pre 27.21 %   GLOBAL Post 26.11 %   GLOBAL % Change  -4.04 %      Personal Goals: Goals established at orientation with interventions provided to work toward goal.     Personal Goals and Risk Factors at Admission - 02/02/16 1429      Core Components/Risk Factors/Patient Goals on Admission    Weight Management Yes;Weight Loss   Intervention Weight Management: Develop a combined nutrition and exercise program designed to reach desired caloric intake, while maintaining appropriate intake of nutrient and fiber, sodium and fats, and appropriate energy expenditure required for the weight goal.;Weight Management: Provide education and appropriate resources to help participant work on and attain dietary goals.;Obesity: Provide education and appropriate resources to help participant work on and attain dietary goals.   Expected Outcomes Short Term: Continue to assess and modify interventions until short term weight is achieved;Long Term: Adherence to nutrition and physical activity/exercise program aimed toward attainment of established weight goal;Weight Maintenance: Understanding of the daily nutrition guidelines, which includes 25-35% calories from fat, 7% or less cal from saturated fats, less than 263m cholesterol, less than 1.5gm of sodium, & 5 or more servings of fruits and vegetables daily;Weight Loss: Understanding of general recommendations for a balanced deficit meal plan, which promotes 1-2 lb weight loss per week and includes a negative energy balance of [phone] kcal/d   Sedentary Yes   Intervention Provide advice, education, support and counseling about physical activity/exercise needs.;Develop an individualized exercise prescription for aerobic and resistive training based on initial evaluation findings, risk stratification, comorbidities and participant's personal goals.   Expected Outcomes Achievement of increased cardiorespiratory fitness and enhanced flexibility, muscular endurance and strength shown through measurements of functional  capacity and personal statement of participant.   Increase Strength and Stamina Yes   Intervention Provide advice, education, support and counseling about physical activity/exercise needs.;Develop an individualized exercise prescription for aerobic and resistive training based on initial evaluation findings, risk stratification, comorbidities and participant's personal goals.   Expected Outcomes Achievement of increased cardiorespiratory fitness and enhanced flexibility, muscular endurance and strength  shown through measurements of functional capacity and personal statement of participant.   Hypertension Yes   Intervention Provide education on lifestyle modifcations including regular physical activity/exercise, weight management, moderate sodium restriction and increased consumption of fresh fruit, vegetables, and low fat dairy, alcohol moderation, and smoking cessation.;Monitor prescription use compliance.   Expected Outcomes Short Term: Continued assessment and intervention until BP is < 140/80m HG in hypertensive participants. < 130/844mHG in hypertensive participants with diabetes, heart failure or chronic kidney disease.;Long Term: Maintenance of blood pressure at goal levels.   Lipids Yes   Intervention Provide education and support for participant on nutrition & aerobic/resistive exercise along with prescribed medications to achieve LDL <7038mHDL >29m68m Expected Outcomes Short Term: Participant states understanding of desired cholesterol values and is compliant with medications prescribed. Participant is following exercise prescription and nutrition guidelines.;Long Term: Cholesterol controlled with medications as prescribed, with individualized exercise RX and with personalized nutrition plan. Value goals: LDL < 70mg17mL > 40 mg.   Personal Goal Other Yes   Personal Goal Increase stamina and flexibility, and be able to play golf   Intervention Provide exercise programming and provide  education on stretching, ROM and functional mobility to assist with improving functional mobility/flexibility.    Expected Outcomes Pt will attend cardiac education classes and participate in active and passive streching. Pt will also be able to return to golf without diffculty.       Personal Goals Discharge:     Goals and Risk Factor Review    Row Name 02/15/16 1202             Core Components/Risk Factors/Patient Goals Review   Personal Goals Review Increase Strength and Stamina;Other       Review Reviewed HEP with pt.  Pt stated he is already walking at home on his off days from [facility] 15-20min39m.  I encouraged him to continue walking and increase his time 1-3 min. each day until he is exercising continuously for 30-45 minutes w/o any symptoms       Expected Outcomes Pt will increase strength and stamina, have less fatigue and improve overall cardiorespiratory fitness.           Nutrition & Weight - Outcomes:     Pre Biometrics - 02/02/16 1214      Pre Biometrics   Weight 195 lb 8.8 oz (88.7 kg)   Waist Circumference 38 inches   Hip Circumference 41 inches   Waist to Hip Ratio 0.93 %   Triceps Skinfold 8 mm   % Body Fat 23.7 %   Grip Strength 45 kg   Flexibility 7.5 in   Single Leg Stand 2.21 seconds         Post Biometrics - 05/19/16 1124       Post  Biometrics   Height 5' 11.5" (1.816 m)   Weight 192 lb 10.9 oz (87.4 kg)   Waist Circumference 36.75 inches   Hip Circumference 39 inches   Waist to Hip Ratio 0.94 %   BMI (Calculated) 26.6   Triceps Skinfold 12 mm   % Body Fat 24.6 %   Grip Strength 46 kg   Flexibility 7 in   Single Leg Stand 8.08 seconds      Nutrition:     Nutrition Therapy & Goals - 02/08/16 1055      Nutrition Therapy   Diet Therapeutic Lifestyle Changes     Personal Nutrition Goals   Nutrition Goal Maintain current wt while in  Cardiac Rehab     Intervention Plan   Intervention Prescribe, educate and counsel regarding  individualized specific dietary modifications aiming towards targeted core components such as weight, hypertension, lipid management, diabetes, heart failure and other comorbidities.   Expected Outcomes Short Term Goal: Understand basic principles of dietary content, such as calories, fat, sodium, cholesterol and nutrients.;Long Term Goal: Adherence to prescribed nutrition plan.      Nutrition Discharge:     Nutrition Assessments - 05/18/16 0944      MEDFICTS Scores   Pre Score 21   Post Score 31   Score Difference 10      Education Questionnaire Score:     Knowledge Questionnaire Score - 04/22/16 1411      Knowledge Questionnaire Score   Post Score 21/24      Goals reviewed with patient; copy given to patient.[name] graduated from cardiac rehab program today with completion of 36 exercise sessions in Phase II. Pt maintained good attendance and progressed nicely during his participation in rehab as evidenced by increased MET level.   Medication list reconciled. Repeat  PHQ score- 0 .  Pt has made significant lifestyle changes and should be commended for his success. Pt feels he has achieved his goals during cardiac rehab. [name] plans to continue exercise by walking and using his weights at home.Barnet Pall, RN,BSN 05/19/2016 11:27 AM.

## 2016-05-04 ENCOUNTER — Encounter (HOSPITAL_COMMUNITY): Payer: PPO

## 2016-05-06 ENCOUNTER — Encounter (HOSPITAL_COMMUNITY): Payer: PPO

## 2016-05-09 ENCOUNTER — Encounter (HOSPITAL_COMMUNITY): Payer: PPO

## 2016-05-11 ENCOUNTER — Encounter (HOSPITAL_COMMUNITY): Payer: PPO

## 2016-05-13 ENCOUNTER — Encounter (HOSPITAL_COMMUNITY): Payer: PPO

## 2016-05-15 DIAGNOSIS — T1581XA Foreign body in other and multiple parts of external eye, right eye, initial encounter: Secondary | ICD-10-CM | POA: Diagnosis not present

## 2016-05-16 ENCOUNTER — Encounter (HOSPITAL_COMMUNITY): Payer: PPO

## 2016-05-18 ENCOUNTER — Encounter (HOSPITAL_COMMUNITY): Payer: PPO

## 2016-05-20 ENCOUNTER — Encounter (HOSPITAL_COMMUNITY): Payer: PPO

## 2016-05-23 ENCOUNTER — Encounter (HOSPITAL_COMMUNITY): Payer: PPO

## 2016-05-25 ENCOUNTER — Encounter (HOSPITAL_COMMUNITY): Payer: PPO

## 2016-05-27 ENCOUNTER — Encounter (HOSPITAL_COMMUNITY): Payer: PPO

## 2016-05-27 DIAGNOSIS — Z800 Family history of malignant neoplasm of digestive organs: Secondary | ICD-10-CM | POA: Diagnosis not present

## 2016-05-27 DIAGNOSIS — I63512 Cerebral infarction due to unspecified occlusion or stenosis of left middle cerebral artery: Secondary | ICD-10-CM | POA: Diagnosis not present

## 2016-05-27 DIAGNOSIS — I4891 Unspecified atrial fibrillation: Secondary | ICD-10-CM | POA: Diagnosis not present

## 2016-05-27 DIAGNOSIS — Z79899 Other long term (current) drug therapy: Secondary | ICD-10-CM | POA: Diagnosis not present

## 2016-05-27 DIAGNOSIS — I2510 Atherosclerotic heart disease of native coronary artery without angina pectoris: Secondary | ICD-10-CM | POA: Diagnosis not present

## 2016-05-27 DIAGNOSIS — Z1389 Encounter for screening for other disorder: Secondary | ICD-10-CM | POA: Diagnosis not present

## 2016-05-27 DIAGNOSIS — N400 Enlarged prostate without lower urinary tract symptoms: Secondary | ICD-10-CM | POA: Diagnosis not present

## 2016-05-27 DIAGNOSIS — M170 Bilateral primary osteoarthritis of knee: Secondary | ICD-10-CM | POA: Diagnosis not present

## 2016-05-27 DIAGNOSIS — R42 Dizziness and giddiness: Secondary | ICD-10-CM | POA: Diagnosis not present

## 2016-05-27 DIAGNOSIS — E785 Hyperlipidemia, unspecified: Secondary | ICD-10-CM | POA: Diagnosis not present

## 2016-05-27 DIAGNOSIS — I739 Peripheral vascular disease, unspecified: Secondary | ICD-10-CM | POA: Diagnosis not present

## 2016-05-27 DIAGNOSIS — Z0000 Encounter for general adult medical examination without abnormal findings: Secondary | ICD-10-CM | POA: Diagnosis not present

## 2016-05-27 DIAGNOSIS — R030 Elevated blood-pressure reading, without diagnosis of hypertension: Secondary | ICD-10-CM | POA: Diagnosis not present

## 2016-05-27 DIAGNOSIS — I119 Hypertensive heart disease without heart failure: Secondary | ICD-10-CM | POA: Diagnosis not present

## 2016-05-27 DIAGNOSIS — I70219 Atherosclerosis of native arteries of extremities with intermittent claudication, unspecified extremity: Secondary | ICD-10-CM | POA: Diagnosis not present

## 2016-05-30 ENCOUNTER — Encounter (HOSPITAL_COMMUNITY): Payer: PPO

## 2016-05-31 ENCOUNTER — Ambulatory Visit: Payer: PPO | Admitting: Cardiology

## 2016-06-01 ENCOUNTER — Encounter (HOSPITAL_COMMUNITY): Payer: PPO

## 2016-06-03 ENCOUNTER — Encounter (HOSPITAL_COMMUNITY): Payer: PPO

## 2016-06-06 ENCOUNTER — Encounter (HOSPITAL_COMMUNITY): Payer: PPO

## 2016-06-14 DIAGNOSIS — M9901 Segmental and somatic dysfunction of cervical region: Secondary | ICD-10-CM | POA: Diagnosis not present

## 2016-06-14 DIAGNOSIS — M542 Cervicalgia: Secondary | ICD-10-CM | POA: Diagnosis not present

## 2016-06-14 DIAGNOSIS — M9902 Segmental and somatic dysfunction of thoracic region: Secondary | ICD-10-CM | POA: Diagnosis not present

## 2016-06-14 DIAGNOSIS — M546 Pain in thoracic spine: Secondary | ICD-10-CM | POA: Diagnosis not present

## 2016-06-15 DIAGNOSIS — M9901 Segmental and somatic dysfunction of cervical region: Secondary | ICD-10-CM | POA: Diagnosis not present

## 2016-06-15 DIAGNOSIS — M546 Pain in thoracic spine: Secondary | ICD-10-CM | POA: Diagnosis not present

## 2016-06-15 DIAGNOSIS — M542 Cervicalgia: Secondary | ICD-10-CM | POA: Diagnosis not present

## 2016-06-15 DIAGNOSIS — M9902 Segmental and somatic dysfunction of thoracic region: Secondary | ICD-10-CM | POA: Diagnosis not present

## 2016-06-16 DIAGNOSIS — M542 Cervicalgia: Secondary | ICD-10-CM | POA: Diagnosis not present

## 2016-06-16 DIAGNOSIS — M9902 Segmental and somatic dysfunction of thoracic region: Secondary | ICD-10-CM | POA: Diagnosis not present

## 2016-06-16 DIAGNOSIS — M546 Pain in thoracic spine: Secondary | ICD-10-CM | POA: Diagnosis not present

## 2016-06-16 DIAGNOSIS — M9901 Segmental and somatic dysfunction of cervical region: Secondary | ICD-10-CM | POA: Diagnosis not present

## 2016-06-20 DIAGNOSIS — H019 Unspecified inflammation of eyelid: Secondary | ICD-10-CM | POA: Diagnosis not present

## 2016-06-21 DIAGNOSIS — L209 Atopic dermatitis, unspecified: Secondary | ICD-10-CM | POA: Diagnosis not present

## 2016-06-21 DIAGNOSIS — H52223 Regular astigmatism, bilateral: Secondary | ICD-10-CM | POA: Diagnosis not present

## 2016-06-21 DIAGNOSIS — Z961 Presence of intraocular lens: Secondary | ICD-10-CM | POA: Diagnosis not present

## 2016-06-21 DIAGNOSIS — H524 Presbyopia: Secondary | ICD-10-CM | POA: Diagnosis not present

## 2016-06-21 DIAGNOSIS — H26493 Other secondary cataract, bilateral: Secondary | ICD-10-CM | POA: Diagnosis not present

## 2016-06-21 DIAGNOSIS — H43393 Other vitreous opacities, bilateral: Secondary | ICD-10-CM | POA: Diagnosis not present

## 2016-06-21 DIAGNOSIS — H33309 Unspecified retinal break, unspecified eye: Secondary | ICD-10-CM | POA: Diagnosis not present

## 2016-06-21 DIAGNOSIS — H43813 Vitreous degeneration, bilateral: Secondary | ICD-10-CM | POA: Diagnosis not present

## 2016-06-21 DIAGNOSIS — H5203 Hypermetropia, bilateral: Secondary | ICD-10-CM | POA: Diagnosis not present

## 2016-06-21 DIAGNOSIS — H33303 Unspecified retinal break, bilateral: Secondary | ICD-10-CM | POA: Diagnosis not present

## 2016-06-22 DIAGNOSIS — M546 Pain in thoracic spine: Secondary | ICD-10-CM | POA: Diagnosis not present

## 2016-06-22 DIAGNOSIS — M9902 Segmental and somatic dysfunction of thoracic region: Secondary | ICD-10-CM | POA: Diagnosis not present

## 2016-06-22 DIAGNOSIS — M542 Cervicalgia: Secondary | ICD-10-CM | POA: Diagnosis not present

## 2016-06-22 DIAGNOSIS — M9901 Segmental and somatic dysfunction of cervical region: Secondary | ICD-10-CM | POA: Diagnosis not present

## 2016-06-23 DIAGNOSIS — M546 Pain in thoracic spine: Secondary | ICD-10-CM | POA: Diagnosis not present

## 2016-06-23 DIAGNOSIS — M542 Cervicalgia: Secondary | ICD-10-CM | POA: Diagnosis not present

## 2016-06-23 DIAGNOSIS — M9901 Segmental and somatic dysfunction of cervical region: Secondary | ICD-10-CM | POA: Diagnosis not present

## 2016-06-23 DIAGNOSIS — M9902 Segmental and somatic dysfunction of thoracic region: Secondary | ICD-10-CM | POA: Diagnosis not present

## 2016-06-27 DIAGNOSIS — M546 Pain in thoracic spine: Secondary | ICD-10-CM | POA: Diagnosis not present

## 2016-06-27 DIAGNOSIS — M542 Cervicalgia: Secondary | ICD-10-CM | POA: Diagnosis not present

## 2016-06-27 DIAGNOSIS — M9901 Segmental and somatic dysfunction of cervical region: Secondary | ICD-10-CM | POA: Diagnosis not present

## 2016-06-27 DIAGNOSIS — M9902 Segmental and somatic dysfunction of thoracic region: Secondary | ICD-10-CM | POA: Diagnosis not present

## 2016-06-30 DIAGNOSIS — M9902 Segmental and somatic dysfunction of thoracic region: Secondary | ICD-10-CM | POA: Diagnosis not present

## 2016-06-30 DIAGNOSIS — M9901 Segmental and somatic dysfunction of cervical region: Secondary | ICD-10-CM | POA: Diagnosis not present

## 2016-06-30 DIAGNOSIS — M542 Cervicalgia: Secondary | ICD-10-CM | POA: Diagnosis not present

## 2016-06-30 DIAGNOSIS — M546 Pain in thoracic spine: Secondary | ICD-10-CM | POA: Diagnosis not present

## 2016-07-05 DIAGNOSIS — L570 Actinic keratosis: Secondary | ICD-10-CM | POA: Diagnosis not present

## 2016-07-05 DIAGNOSIS — L821 Other seborrheic keratosis: Secondary | ICD-10-CM | POA: Diagnosis not present

## 2016-07-26 ENCOUNTER — Other Ambulatory Visit: Payer: PPO | Admitting: *Deleted

## 2016-07-26 DIAGNOSIS — I4819 Other persistent atrial fibrillation: Secondary | ICD-10-CM

## 2016-07-26 DIAGNOSIS — I481 Persistent atrial fibrillation: Secondary | ICD-10-CM | POA: Diagnosis not present

## 2016-07-26 DIAGNOSIS — E7800 Pure hypercholesterolemia, unspecified: Secondary | ICD-10-CM

## 2016-07-26 LAB — HEPATIC FUNCTION PANEL
ALT: 27 [IU]/L (ref 0–44)
AST: 32 [IU]/L (ref 0–40)
Albumin: 4.2 g/dL (ref 3.5–4.8)
Alkaline Phosphatase: 40 [IU]/L (ref 39–117)
Bilirubin Total: 0.4 mg/dL (ref 0.0–1.2)
Bilirubin, Direct: 0.13 mg/dL (ref 0.00–0.40)
Total Protein: 6.5 g/dL (ref 6.0–8.5)

## 2016-07-26 LAB — LIPID PANEL
Chol/HDL Ratio: 1.9 ratio (ref 0.0–5.0)
Cholesterol, Total: 162 mg/dL (ref 100–199)
HDL: 87 mg/dL
LDL Calculated: 68 mg/dL (ref 0–99)
Triglycerides: 35 mg/dL (ref 0–149)
VLDL Cholesterol Cal: 7 mg/dL (ref 5–40)

## 2016-07-26 LAB — TSH: TSH: 1.92 u[IU]/mL (ref 0.450–4.500)

## 2016-09-07 ENCOUNTER — Ambulatory Visit (INDEPENDENT_AMBULATORY_CARE_PROVIDER_SITE_OTHER): Payer: PPO | Admitting: Ophthalmology

## 2016-09-07 DIAGNOSIS — H43813 Vitreous degeneration, bilateral: Secondary | ICD-10-CM | POA: Diagnosis not present

## 2016-09-07 DIAGNOSIS — H338 Other retinal detachments: Secondary | ICD-10-CM

## 2016-09-07 DIAGNOSIS — H35033 Hypertensive retinopathy, bilateral: Secondary | ICD-10-CM | POA: Diagnosis not present

## 2016-09-07 DIAGNOSIS — I10 Essential (primary) hypertension: Secondary | ICD-10-CM | POA: Diagnosis not present

## 2016-09-07 DIAGNOSIS — H26491 Other secondary cataract, right eye: Secondary | ICD-10-CM | POA: Diagnosis not present

## 2016-09-07 DIAGNOSIS — H33302 Unspecified retinal break, left eye: Secondary | ICD-10-CM | POA: Diagnosis not present

## 2016-10-05 ENCOUNTER — Encounter (INDEPENDENT_AMBULATORY_CARE_PROVIDER_SITE_OTHER): Payer: PPO | Admitting: Ophthalmology

## 2016-10-05 DIAGNOSIS — H2701 Aphakia, right eye: Secondary | ICD-10-CM

## 2016-10-12 DIAGNOSIS — M542 Cervicalgia: Secondary | ICD-10-CM | POA: Diagnosis not present

## 2016-10-12 DIAGNOSIS — M9901 Segmental and somatic dysfunction of cervical region: Secondary | ICD-10-CM | POA: Diagnosis not present

## 2016-10-12 DIAGNOSIS — M546 Pain in thoracic spine: Secondary | ICD-10-CM | POA: Diagnosis not present

## 2016-10-12 DIAGNOSIS — M9902 Segmental and somatic dysfunction of thoracic region: Secondary | ICD-10-CM | POA: Diagnosis not present

## 2016-10-13 DIAGNOSIS — M546 Pain in thoracic spine: Secondary | ICD-10-CM | POA: Diagnosis not present

## 2016-10-13 DIAGNOSIS — M9901 Segmental and somatic dysfunction of cervical region: Secondary | ICD-10-CM | POA: Diagnosis not present

## 2016-10-13 DIAGNOSIS — M542 Cervicalgia: Secondary | ICD-10-CM | POA: Diagnosis not present

## 2016-10-13 DIAGNOSIS — M9902 Segmental and somatic dysfunction of thoracic region: Secondary | ICD-10-CM | POA: Diagnosis not present

## 2016-10-17 DIAGNOSIS — M542 Cervicalgia: Secondary | ICD-10-CM | POA: Diagnosis not present

## 2016-10-17 DIAGNOSIS — M546 Pain in thoracic spine: Secondary | ICD-10-CM | POA: Diagnosis not present

## 2016-10-17 DIAGNOSIS — M9902 Segmental and somatic dysfunction of thoracic region: Secondary | ICD-10-CM | POA: Diagnosis not present

## 2016-10-17 DIAGNOSIS — M9901 Segmental and somatic dysfunction of cervical region: Secondary | ICD-10-CM | POA: Diagnosis not present

## 2016-10-19 DIAGNOSIS — M542 Cervicalgia: Secondary | ICD-10-CM | POA: Diagnosis not present

## 2016-10-19 DIAGNOSIS — M9902 Segmental and somatic dysfunction of thoracic region: Secondary | ICD-10-CM | POA: Diagnosis not present

## 2016-10-19 DIAGNOSIS — M9901 Segmental and somatic dysfunction of cervical region: Secondary | ICD-10-CM | POA: Diagnosis not present

## 2016-10-19 DIAGNOSIS — M546 Pain in thoracic spine: Secondary | ICD-10-CM | POA: Diagnosis not present

## 2016-10-21 ENCOUNTER — Ambulatory Visit: Payer: PPO | Admitting: Cardiology

## 2016-10-25 DIAGNOSIS — M542 Cervicalgia: Secondary | ICD-10-CM | POA: Diagnosis not present

## 2016-10-25 DIAGNOSIS — M9901 Segmental and somatic dysfunction of cervical region: Secondary | ICD-10-CM | POA: Diagnosis not present

## 2016-10-25 DIAGNOSIS — M9902 Segmental and somatic dysfunction of thoracic region: Secondary | ICD-10-CM | POA: Diagnosis not present

## 2016-10-25 DIAGNOSIS — M546 Pain in thoracic spine: Secondary | ICD-10-CM | POA: Diagnosis not present

## 2016-10-28 ENCOUNTER — Ambulatory Visit (INDEPENDENT_AMBULATORY_CARE_PROVIDER_SITE_OTHER): Payer: PPO | Admitting: Cardiology

## 2016-10-28 ENCOUNTER — Encounter: Payer: Self-pay | Admitting: Cardiology

## 2016-10-28 VITALS — BP 146/84 | HR 48 | Ht 71.5 in | Wt 184.8 lb

## 2016-10-28 DIAGNOSIS — I6523 Occlusion and stenosis of bilateral carotid arteries: Secondary | ICD-10-CM

## 2016-10-28 DIAGNOSIS — Z79899 Other long term (current) drug therapy: Secondary | ICD-10-CM | POA: Diagnosis not present

## 2016-10-28 DIAGNOSIS — I2510 Atherosclerotic heart disease of native coronary artery without angina pectoris: Secondary | ICD-10-CM | POA: Diagnosis not present

## 2016-10-28 DIAGNOSIS — E7800 Pure hypercholesterolemia, unspecified: Secondary | ICD-10-CM

## 2016-10-28 DIAGNOSIS — I4819 Other persistent atrial fibrillation: Secondary | ICD-10-CM

## 2016-10-28 DIAGNOSIS — I481 Persistent atrial fibrillation: Secondary | ICD-10-CM | POA: Diagnosis not present

## 2016-10-28 DIAGNOSIS — I10 Essential (primary) hypertension: Secondary | ICD-10-CM | POA: Diagnosis not present

## 2016-10-28 NOTE — Patient Instructions (Signed)
Medication Instructions:  Please stop your ASA. Continue all other medications as listed.  Labwork: Please have blood work today (CBC, BMP)  Follow-Up: Follow up in 1 year with Dr. Radford Pax.  You will receive a letter in the mail 2 months before you are due.  Please call us when you receive this letter to schedule your follow up appointment.  If you need a refill on your cardiac medications before your next appointment, please call your pharmacy.  Thank you for choosing [facility]!!

## 2016-10-28 NOTE — Progress Notes (Signed)
Cardiology Office Note:    Date:  10/28/2016   ID:  [name], DOB 12-03-1940, MRN [id number]  PCP:  Josetta Huddle, MD  Cardiologist:  Fransico Him, MD   Referring MD: Josetta Huddle, MD   Chief Complaint  Patient presents with  . Coronary Artery Disease  . Hypertension  . Hyperlipidemia  . Atrial Fibrillation    History of Present Illness:    [name] is a 76 y.o. male with a hx of CAD (s/p CABG with LIMA to LAD, SVG to RCA and SVG to LCx 12/2015), HTN, dyslipidemia, PAF on chronic anticoagulation with Eliquis, carotid artery stenosis s/p  L CEA, AAA s/p repair and followed by Dr. Donnetta Hutching. He had post op afib and was placed on Amio post op from CABG.  He is here today for followup and is doing well.  He denies any chest pain or pressure, SOB, DOE, PND, orthopnea, LE edema, dizziness, palpitations or syncope. He is compliant with his meds and is tolerating meds with no SE.    Past Medical History:  Diagnosis Date  . Arthritis    HANDS AND FEET  . Asymptomatic carotid artery stenosis BILATERAL   PER DOPPLER  03/2016  RICA  1 - 39%/  s/p  L CEA  1-39%- followed by Dr. Donnetta Hutching  . CAD (coronary artery disease)    s/p remote stenting, s/p CABG with LIMA to LAD, SVG to RCA and SVG to LCx 12/2015  . Cataract immature BILATERAL   . Diverticulosis   . 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 History of diverticulosis    Noted on Colonoscopy  . History of inguinal herniorrhaphy   . Hydrocele, left   . Hyperlipidemia   . Hypertension   . Increased intraocular pressure   . Nocturia   . Paroxysmal atrial fibrillation ([place]) 06/30/14   chad2vasc score is at least 4  . 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 Retinal detachment   . Stroke [facility])     Past Surgical History:  Procedure Laterality Date  . AAA REPAIR W/ AORTOBIFEMORAL BYPASS  OCT  53976  . ABDOMINAL HERNIA REPAIR  X4  (LAST ONE 1990'S)   inguinal herniorrhaphy  .  CARDIAC CATHETERIZATION N/A 12/16/2015   Procedure: Left Heart Cath and Coronary Angiography;  Surgeon: Wellington Hampshire, MD;  Location: [place] CV LAB;  Service: Cardiovascular;  Laterality: N/A;  . CAROTID ENDARTERECTOMY    . CARPAL TUNNEL RELEASE Right Oct. 2013   Hand  . CORONARY ANGIOPLASTY  2002   PTCA  W/ X2 BM STENTS--   PROXIMA LAD  &  LEFT CIRCUMFLEX TO FIRST OBTUSE MARGINAL BRANCH  . CORONARY ARTERY BYPASS GRAFT N/A 12/18/2015   Procedure: CORONARY ARTERY BYPASS GRAFTING (CABG) x 3, using left internal mammary artery and bilateral   leg greater saphenous vein harvested endoscopically  LIMA to LAD, SVG to Circumflex, SVG to RCA;  Surgeon: Grace Isaac, MD;  Location: [place];  Service: Open Heart Surgery;  Laterality: N/A;  . ENDARTERECTOMY Left 03/11/2015   Procedure: ENDARTERECTOMY LEFT CAROTID;  Surgeon: Rosetta Posner, MD;  Location: [place];  Service: Vascular;  Laterality: Left;  . EXPLORATORY LAPAROTOMY W/ ENTEROLYSIS FOR PARTIAL SBO  09-23-2008  . heart stents  02/19/00  . HYDROCELE EXCISION  01/24/2011   Procedure: HYDROCELECTOMY ADULT;  Surgeon: Bernestine Amass, MD;  Location[facility];  Service: Urology;  Laterality: Left;  .  Ingrown Toe Nail Right Jan. 2015   Right Great Toe nail  . intest blockage  09/22/08  . PATCH ANGIOPLASTY Left 03/11/2015   Procedure: PATCH ANGIOPLASTY LEFT CAROTID USING HEMASHIELD PLATINUM FINESSE PATCH;  Surgeon: Rosetta Posner, MD;  Location: [place];  Service: Vascular;  Laterality: Left;  . PULLEY RELEASE -- RIGHT 5TH FINGER  2009  . RETINAL DETACHMENT SURGERY  2004   BILATERAL  . TEE WITHOUT CARDIOVERSION N/A 12/18/2015   Procedure: TRANSESOPHAGEAL ECHOCARDIOGRAM (TEE);  Surgeon: Grace Isaac, MD;  Location: [place];  Service: Open Heart Surgery;  Laterality: N/A;  . TOOTH EXTRACTION  Dec 2013    Current Medications: Current Meds  Medication Sig  . acetaminophen (TYLENOL) 500 MG tablet Take 2 tablets (1,000 mg total) by  mouth every 6 (six) hours as needed.  [name] amiodarone (PACERONE) 200 MG tablet Take 1 tablet (200 mg total) by mouth daily.  [name] apixaban (ELIQUIS) 5 MG TABS tablet Take 1 tablet (5 mg total) by mouth 2 (two) times daily.  [name] aspirin EC 81 MG tablet Take 81 mg by mouth daily.  [name] b complex vitamins tablet Take 1 tablet by mouth daily.    . calcium carbonate (OS-CAL) 600 MG TABS Take 600 mg by mouth daily.    . Cholecalciferol 1000 UNITS capsule Take 2,000 Units by mouth daily.   . Coenzyme Q10 (CO Q 10) 100 MG CAPS Take 1 capsule by mouth daily.  . cyanocobalamin 1000 MCG tablet Take 1,000 mcg by mouth daily.  [name] EPINEPHrine (EPIPEN) 0.3 mg/0.3 mL SOAJ injection Inject 0.3 mg into the muscle daily as needed (allergic reaction).   . finasteride (PROSCAR) 5 MG tablet Take 5 mg by mouth daily.  . hydrochlorothiazide (HYDRODIURIL) 25 MG tablet Take 25 mg by mouth daily.  [name] losartan (COZAAR) 25 MG tablet Take 1 tablet (25 mg total) by mouth daily.  . Magnesium Hydroxide (MAGNESIA PO) Take 1,000 mg by mouth daily.   . Multiple Vitamin (MULTIVITAMIN) tablet Take 1 tablet by mouth daily.    . Omega-3 Fatty Acids (FISH OIL PO) Take 1,200 mg by mouth daily.  . potassium gluconate 595 (99 K) MG TABS tablet Take 595 mg by mouth.  . rosuvastatin (CRESTOR) 40 MG tablet Take 1 tablet (40 mg total) by mouth every evening.  . Tamsulosin HCl (FLOMAX) 0.4 MG CAPS Take 0.4 mg by mouth daily.    . vitamin C (ASCORBIC ACID) 500 MG tablet Take 2,000 mg by mouth daily.     Allergies:   Wasp venom   Social History   Social History  . Marital status: Married    Spouse name: N/A  . Number of children: N/A  . Years of education: N/A   Social History Main Topics  . Smoking status: Former Smoker    Years: 30.00    Types: Cigarettes    Quit date: 01/18/1978  . Smokeless tobacco: Former Systems developer    Quit date: 1980  . Alcohol use 13.2 oz/week    1 Shots of liquor, 21 Standard drinks or equivalent per week     Comment: 2  oz of vodka per day  . Drug use: No  . Sexual activity: Not Asked   Other Topics Concern  . None   Social History Narrative   Pt lives in [place] with spouse.  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     Family History: The patient's family history includes Cancer (age of onset: 22) in  his mother; Heart disease in his mother; Heart failure in his father; Hyperlipidemia in his mother.  ROS:   Please see the history of present illness.    ROS  All other systems reviewed and negative.   EKGs/Labs/Other Studies Reviewed:    The following studies were reviewed today: none  EKG:  EKG is not ordered today.   Recent Labs: 12/19/2015: Magnesium 2.2 12/21/2015: BUN 11; Creatinine, Ser 0.95; Hemoglobin 8.4; Platelets 164; Potassium 3.7; Sodium 134 07/26/2016: ALT 27; TSH 1.920   Recent Lipid Panel    Component Value Date/Time   CHOL 162 07/26/2016 0754   CHOL 164 05/24/2013 0916   TRIG 35 07/26/2016 0754   TRIG 50 05/24/2013 0916   HDL 87 07/26/2016 0754   HDL 84 05/24/2013 0916   CHOLHDL 1.9 07/26/2016 0754   CHOLHDL 2.4 12/16/2015 0218   VLDL 15 12/16/2015 0218   LDLCALC 68 07/26/2016 0754   LDLCALC 70 05/24/2013 0916    Physical Exam:    VS:  BP (!) 146/84   Pulse (!) 48   Ht 5' 11.5" (1.816 m)   Wt 184 lb 12.8 oz (83.8 kg)   SpO2 98%   BMI 25.42 kg/m     Wt Readings from Last 3 Encounters:  10/28/16 184 lb 12.8 oz (83.8 kg)  05/19/16 192 lb 10.9 oz (87.4 kg)  04/21/16 196 lb 6.4 oz (89.1 kg)     GEN:  Well nourished, well developed in no acute distress HEENT: Normal NECK: No JVD; No carotid bruits LYMPHATICS: No lymphadenopathy CARDIAC: RRR, no  rubs, gallops.  2/6 late SM at LLSB to apex RESPIRATORY:  Clear to auscultation without rales, wheezing or rhonchi  ABDOMEN: Soft, non-tender, non-distended MUSCULOSKELETAL:  No edema; No deformity  SKIN: Warm and dry NEUROLOGIC:  Alert and oriented x 3 PSYCHIATRIC:  Normal affect    ASSESSMENT:    1. Atherosclerosis of native coronary artery of native heart without angina pectoris   2. Persistent atrial fibrillation ([place])   3. Bilateral carotid artery stenosis   4. Essential hypertension   5. Pure hypercholesterolemia    PLAN:    In order of problems listed above:  1.  ASACAD - s/p remote CABG - he denies any angina symptoms.  He will continue on statin. He is having a lot of bruising so I have told him that we can stop his ASA.   2.  Persistent atrial fibrillation - he is maintaining NSR by exam.  He will continue on Amio 200mg  daily, apixaban 5mg  BID, Diltiazem 300mg  daily.  I will check a BMET and CBC.  3.  Bilateral carotid artery stenosis - s/p Left CEA with 1-39% bilateral stenosis.  He will continue on statin.  4.  HTN - BP is borderlineontrolled on exam today.  He will continue on Cardizem, Losartan, HCTZ and Cardizem.  I have asked him to check his BP daily for a week and call with the results.   4.  Hyperlipidemia with LDL goal < 70.  LDL is at goal at 68.  He will continue on Crestor 40mg  daily   Medication Adjustments/Labs and Tests Ordered: Current medicines are reviewed at length with the patient today.  Concerns regarding medicines are outlined above.  No orders of the defined types were placed in this encounter.  No orders of the defined types were placed in this encounter.   Signed, Fransico Him, MD  10/28/2016 8:28 AM    [place]

## 2016-10-28 NOTE — Addendum Note (Signed)
Addended by: Shellia Cleverly on: 10/28/2016 08:43 AM   Modules accepted: Orders

## 2016-10-29 LAB — BASIC METABOLIC PANEL
BUN/Creatinine Ratio: 16 (ref 10–24)
BUN: 22 mg/dL (ref 8–27)
CALCIUM: 9.5 mg/dL (ref 8.6–10.2)
CO2: 26 mmol/L (ref 20–29)
CREATININE: 1.36 mg/dL — AB (ref 0.76–1.27)
Chloride: 94 mmol/L — ABNORMAL LOW (ref 96–106)
GFR, EST AFRICAN AMERICAN: 58 mL/min/{1.73_m2} — AB (ref >59)
GFR, EST NON AFRICAN AMERICAN: 50 mL/min/{1.73_m2} — AB (ref >59)
Glucose: 90 mg/dL (ref 65–99)
POTASSIUM: 4.3 mmol/L (ref 3.5–5.2)
Sodium: 136 mmol/L (ref 134–144)

## 2016-10-29 LAB — CBC
HEMATOCRIT: 41.1 % (ref 37.5–51.0)
HEMOGLOBIN: 13.8 g/dL (ref 13.0–17.7)
MCH: 33.1 pg — AB (ref 26.6–33.0)
MCHC: 33.6 g/dL (ref 31.5–35.7)
MCV: 99 fL — AB (ref 79–97)
Platelets: 222 10*3/uL (ref 150–379)
RBC: 4.17 x10E6/uL (ref 4.14–5.80)
RDW: 14.1 % (ref 12.3–15.4)
WBC: 4.5 10*3/uL (ref 3.4–10.8)

## 2016-10-31 ENCOUNTER — Telehealth: Payer: Self-pay

## 2016-10-31 DIAGNOSIS — I10 Essential (primary) hypertension: Secondary | ICD-10-CM

## 2016-10-31 NOTE — Telephone Encounter (Signed)
spoke with patient about recent lab results. patient verbalized understanding. patient agreed to stop HCTZ.  patient will keep a log of daily BP recordings. repeat BMET scheduled for tuesday october 23rd.

## 2016-11-07 ENCOUNTER — Telehealth: Payer: Self-pay | Admitting: Cardiology

## 2016-11-07 NOTE — Telephone Encounter (Signed)
BP is too high - add amlodipine 5mg  daily and repeat BP check daily for a week and call with results

## 2016-11-07 NOTE — Telephone Encounter (Signed)
I spoke with pt who reports BP readings as listed below.  HCTZ was recently stopped due to elevated creatinine.  He is coming in for [facility] tomorrow. He takes Losartan in the morning and first reading of the day is before taking medication.  Other readings are after taking medication. He is feeling fine and was able to golf today without any problems.  Will forward to Dr. Radford Pax for review/recomendations.

## 2016-11-07 NOTE — Telephone Encounter (Signed)
New message    Pt is calling.   Pt c/o BP issue: STAT if pt c/o blurred vision, one-sided weakness or slurred speech  1. What are your last 5 BP readings? Sat-168/79, 163/75, 122/68 Sun-194/86, 140/61, 151/83, 142/67 Mon-189/79, 184/80, 175/82,   2. Are you having any other symptoms (ex. Dizziness, headache, blurred vision, passed out)? No symptoms.  3. What is your BP issue? Pt states that his bp is high. He would like to talk to RN about this.

## 2016-11-08 ENCOUNTER — Other Ambulatory Visit: Payer: PPO

## 2016-11-08 DIAGNOSIS — I10 Essential (primary) hypertension: Secondary | ICD-10-CM | POA: Diagnosis not present

## 2016-11-08 LAB — BASIC METABOLIC PANEL
BUN/Creatinine Ratio: 18 (ref 10–24)
BUN: 24 mg/dL (ref 8–27)
CO2: 27 mmol/L (ref 20–29)
CREATININE: 1.33 mg/dL — AB (ref 0.76–1.27)
Calcium: 9.2 mg/dL (ref 8.6–10.2)
Chloride: 97 mmol/L (ref 96–106)
GFR, EST AFRICAN AMERICAN: 59 mL/min/{1.73_m2} — AB (ref >59)
GFR, EST NON AFRICAN AMERICAN: 51 mL/min/{1.73_m2} — AB (ref >59)
Glucose: 69 mg/dL (ref 65–99)
Potassium: 4.4 mmol/L (ref 3.5–5.2)
SODIUM: 137 mmol/L (ref 134–144)

## 2016-11-08 MED ORDER — AMLODIPINE BESYLATE 5 MG PO TABS: 5.0000 mg | ORAL_TABLET | Freq: Every day | ORAL | 0 refills | Status: DC

## 2016-11-08 NOTE — Telephone Encounter (Signed)
Called patient about Dr. Theodosia Blender recommendations. Patient requested 30 day supply with no refills, so if medication works for him, he will call for refill for 90 day supply. Patient will check BP daily and call in about a week with results.

## 2016-11-15 DIAGNOSIS — I4891 Unspecified atrial fibrillation: Secondary | ICD-10-CM | POA: Diagnosis not present

## 2016-11-15 DIAGNOSIS — J40 Bronchitis, not specified as acute or chronic: Secondary | ICD-10-CM | POA: Diagnosis not present

## 2016-11-15 DIAGNOSIS — M159 Polyosteoarthritis, unspecified: Secondary | ICD-10-CM | POA: Diagnosis not present

## 2016-11-15 DIAGNOSIS — I63512 Cerebral infarction due to unspecified occlusion or stenosis of left middle cerebral artery: Secondary | ICD-10-CM | POA: Diagnosis not present

## 2016-11-15 DIAGNOSIS — M170 Bilateral primary osteoarthritis of knee: Secondary | ICD-10-CM | POA: Diagnosis not present

## 2016-11-15 DIAGNOSIS — I701 Atherosclerosis of renal artery: Secondary | ICD-10-CM | POA: Diagnosis not present

## 2016-11-15 DIAGNOSIS — I2510 Atherosclerotic heart disease of native coronary artery without angina pectoris: Secondary | ICD-10-CM | POA: Diagnosis not present

## 2016-11-15 DIAGNOSIS — I10 Essential (primary) hypertension: Secondary | ICD-10-CM | POA: Diagnosis not present

## 2016-11-15 DIAGNOSIS — N400 Enlarged prostate without lower urinary tract symptoms: Secondary | ICD-10-CM | POA: Diagnosis not present

## 2016-11-15 DIAGNOSIS — E785 Hyperlipidemia, unspecified: Secondary | ICD-10-CM | POA: Diagnosis not present

## 2016-11-15 DIAGNOSIS — I119 Hypertensive heart disease without heart failure: Secondary | ICD-10-CM | POA: Diagnosis not present

## 2016-11-22 DIAGNOSIS — M9902 Segmental and somatic dysfunction of thoracic region: Secondary | ICD-10-CM | POA: Diagnosis not present

## 2016-11-22 DIAGNOSIS — M9901 Segmental and somatic dysfunction of cervical region: Secondary | ICD-10-CM | POA: Diagnosis not present

## 2016-11-22 DIAGNOSIS — M546 Pain in thoracic spine: Secondary | ICD-10-CM | POA: Diagnosis not present

## 2016-11-22 DIAGNOSIS — M542 Cervicalgia: Secondary | ICD-10-CM | POA: Diagnosis not present

## 2016-11-23 DIAGNOSIS — M546 Pain in thoracic spine: Secondary | ICD-10-CM | POA: Diagnosis not present

## 2016-11-23 DIAGNOSIS — M9902 Segmental and somatic dysfunction of thoracic region: Secondary | ICD-10-CM | POA: Diagnosis not present

## 2016-11-23 DIAGNOSIS — M9901 Segmental and somatic dysfunction of cervical region: Secondary | ICD-10-CM | POA: Diagnosis not present

## 2016-11-23 DIAGNOSIS — M542 Cervicalgia: Secondary | ICD-10-CM | POA: Diagnosis not present

## 2016-11-24 DIAGNOSIS — M546 Pain in thoracic spine: Secondary | ICD-10-CM | POA: Diagnosis not present

## 2016-11-24 DIAGNOSIS — M9902 Segmental and somatic dysfunction of thoracic region: Secondary | ICD-10-CM | POA: Diagnosis not present

## 2016-11-24 DIAGNOSIS — M542 Cervicalgia: Secondary | ICD-10-CM | POA: Diagnosis not present

## 2016-11-24 DIAGNOSIS — M9901 Segmental and somatic dysfunction of cervical region: Secondary | ICD-10-CM | POA: Diagnosis not present

## 2016-11-28 ENCOUNTER — Telehealth: Payer: Self-pay | Admitting: Cardiology

## 2016-11-28 DIAGNOSIS — M542 Cervicalgia: Secondary | ICD-10-CM | POA: Diagnosis not present

## 2016-11-28 DIAGNOSIS — M9901 Segmental and somatic dysfunction of cervical region: Secondary | ICD-10-CM | POA: Diagnosis not present

## 2016-11-28 DIAGNOSIS — M9902 Segmental and somatic dysfunction of thoracic region: Secondary | ICD-10-CM | POA: Diagnosis not present

## 2016-11-28 DIAGNOSIS — M546 Pain in thoracic spine: Secondary | ICD-10-CM | POA: Diagnosis not present

## 2016-11-28 NOTE — Telephone Encounter (Signed)
Have him check his BP daily 2 hours after his meds for a week and call back with the results

## 2016-11-28 NOTE — Telephone Encounter (Signed)
Patient calling, that he is taking two medications to help control BP. Patient does not think the medications are effective in stabilizing BP and would like to know if he should increase dosage or take additional medication.  Patient will be leaving to go out of the country on Friday morning for three weeks and would like to have recommendations before he leaves.

## 2016-11-28 NOTE — Telephone Encounter (Signed)
Patient is calling in with BP reading from today at 9am 145/68 HR 52, 3pm 141/70 HR 50. Patient states that is blood pressure is improving but he feels like his BP is still too high. Patient would like to know what his target BP goal should be and requesting refills on his amlodipine. Informed patient I would send to Dr. Radford Pax for further recommendations. Patient verbalized understanding and thanked me for the call.

## 2016-11-29 NOTE — Telephone Encounter (Signed)
Spoke with pt and went over recommendations per Dr. Radford Pax.  Pt states that he is leaving early Friday morning to go to [place] for an extended period of time.  BP yesterday was 145/68 at 9AM and 141/70 at 3PM.  BP this AM was 146/69.  Pt takes meds sometime between 6-7PM.  Advised to monitor BP two hours after meds and call first thing Thursday morning for advisement.  Pt in agreement with plan.

## 2016-12-01 ENCOUNTER — Other Ambulatory Visit: Payer: Self-pay | Admitting: Cardiology

## 2016-12-01 DIAGNOSIS — M542 Cervicalgia: Secondary | ICD-10-CM | POA: Diagnosis not present

## 2016-12-01 DIAGNOSIS — M9902 Segmental and somatic dysfunction of thoracic region: Secondary | ICD-10-CM | POA: Diagnosis not present

## 2016-12-01 DIAGNOSIS — M546 Pain in thoracic spine: Secondary | ICD-10-CM | POA: Diagnosis not present

## 2016-12-01 DIAGNOSIS — M9901 Segmental and somatic dysfunction of cervical region: Secondary | ICD-10-CM | POA: Diagnosis not present

## 2016-12-01 NOTE — Telephone Encounter (Signed)
Pt needs to catch a flight tomorrow at 12 noon and is requesting Dr Radford Pax review this ASAP so if a prescription is needed he will be able to pick it up before his flight.

## 2016-12-01 NOTE — Telephone Encounter (Signed)
LMTCB for pt 

## 2016-12-01 NOTE — Telephone Encounter (Signed)
I will forward to Dr Radford Pax for review.

## 2016-12-01 NOTE — Telephone Encounter (Signed)
F/U Call/:  Patient calling, states that he was instructed to give BP readings to nurse  Monday: 145/68 pulse 52 @9 :00am 141/70 pulse 50 @3pm   Tuesday  146/64 pulse 50 @8am  164/77 Pulse 54 @ 4pm 136/62 Pulse 52 @10pm   Wednesday: 162/69 pulse 48 @ 9am 145/71 pulse 49 6pm 129/55 Pulse 53 @9pm   Thursday  152/70 pulse 51 @8am 

## 2016-12-01 NOTE — Telephone Encounter (Signed)
F/u message  Pt call to f/u with RN. Please call back to discuss

## 2016-12-02 MED ORDER — AMLODIPINE BESYLATE 5 MG PO TABS: 5.0000 mg | ORAL_TABLET | Freq: Every day | ORAL | 1 refills | Status: DC

## 2016-12-02 MED ORDER — AMLODIPINE BESYLATE 2.5 MG PO TABS: 2.5000 mg | ORAL_TABLET | Freq: Every day | ORAL | 1 refills | Status: DC

## 2016-12-02 NOTE — Telephone Encounter (Signed)
Discussed with patient, he verbalized understanding, he is going to be gone for about 20 days, knows to check BP daily for a week when he returns and call with the readings.

## 2016-12-02 NOTE — Telephone Encounter (Signed)
Increase amlodipine to 7.5mg  daily and check BP daily for a week once he gets back from his trip

## 2016-12-23 DIAGNOSIS — R609 Edema, unspecified: Secondary | ICD-10-CM | POA: Diagnosis not present

## 2016-12-23 DIAGNOSIS — I4891 Unspecified atrial fibrillation: Secondary | ICD-10-CM | POA: Diagnosis not present

## 2016-12-23 DIAGNOSIS — I119 Hypertensive heart disease without heart failure: Secondary | ICD-10-CM | POA: Diagnosis not present

## 2016-12-23 DIAGNOSIS — I10 Essential (primary) hypertension: Secondary | ICD-10-CM | POA: Diagnosis not present

## 2016-12-23 DIAGNOSIS — J40 Bronchitis, not specified as acute or chronic: Secondary | ICD-10-CM | POA: Diagnosis not present

## 2016-12-29 ENCOUNTER — Telehealth: Payer: Self-pay | Admitting: Cardiology

## 2016-12-29 NOTE — Telephone Encounter (Signed)
New Message   Pt c/o BP issue: STAT if pt c/o blurred vision, one-sided weakness or slurred speech  1. What are your last 5 BP readings? 12/8  125/59, 12/9 108/54, 12/10 128/61, 12/11 129/61, 12/12  117/56  2. Are you having any other symptoms (ex. Dizziness, headache, blurred vision, passed out)? No symptons  3. What is your BP issue? Pt calling to give bp reading for Dr. Radford Pax   Patient also states that the PCP Inda Merlin put him back on HCTZ. Please call.

## 2016-12-29 NOTE — Telephone Encounter (Signed)
BP looks good - no change in meds 

## 2016-12-29 NOTE — Telephone Encounter (Signed)
Returned call to patient with BP readings:  12/8 125/59 12/9 108/54 12/10 128/61 12/11 129/61 12/12 117/56   Patient also c/o LE edema from amlodipine and was started on HCTZ 12.5 mg once a day by Dr. Inda Merlin. Informed patient I would send to Dr. Radford Pax to review. Pt verbalized understanding and thanked me for the call.

## 2017-01-04 DIAGNOSIS — L570 Actinic keratosis: Secondary | ICD-10-CM | POA: Diagnosis not present

## 2017-01-04 DIAGNOSIS — L814 Other melanin hyperpigmentation: Secondary | ICD-10-CM | POA: Diagnosis not present

## 2017-01-04 DIAGNOSIS — L821 Other seborrheic keratosis: Secondary | ICD-10-CM | POA: Diagnosis not present

## 2017-02-01 DIAGNOSIS — I10 Essential (primary) hypertension: Secondary | ICD-10-CM | POA: Diagnosis not present

## 2017-02-01 DIAGNOSIS — I4891 Unspecified atrial fibrillation: Secondary | ICD-10-CM | POA: Diagnosis not present

## 2017-02-01 DIAGNOSIS — I119 Hypertensive heart disease without heart failure: Secondary | ICD-10-CM | POA: Diagnosis not present

## 2017-02-01 DIAGNOSIS — R609 Edema, unspecified: Secondary | ICD-10-CM | POA: Diagnosis not present

## 2017-02-16 DIAGNOSIS — M546 Pain in thoracic spine: Secondary | ICD-10-CM | POA: Diagnosis not present

## 2017-02-16 DIAGNOSIS — M542 Cervicalgia: Secondary | ICD-10-CM | POA: Diagnosis not present

## 2017-02-16 DIAGNOSIS — M9901 Segmental and somatic dysfunction of cervical region: Secondary | ICD-10-CM | POA: Diagnosis not present

## 2017-02-16 DIAGNOSIS — M9902 Segmental and somatic dysfunction of thoracic region: Secondary | ICD-10-CM | POA: Diagnosis not present

## 2017-02-22 DIAGNOSIS — M546 Pain in thoracic spine: Secondary | ICD-10-CM | POA: Diagnosis not present

## 2017-02-22 DIAGNOSIS — M9901 Segmental and somatic dysfunction of cervical region: Secondary | ICD-10-CM | POA: Diagnosis not present

## 2017-02-22 DIAGNOSIS — M542 Cervicalgia: Secondary | ICD-10-CM | POA: Diagnosis not present

## 2017-02-22 DIAGNOSIS — M9902 Segmental and somatic dysfunction of thoracic region: Secondary | ICD-10-CM | POA: Diagnosis not present

## 2017-02-23 DIAGNOSIS — M542 Cervicalgia: Secondary | ICD-10-CM | POA: Diagnosis not present

## 2017-02-23 DIAGNOSIS — M9901 Segmental and somatic dysfunction of cervical region: Secondary | ICD-10-CM | POA: Diagnosis not present

## 2017-02-23 DIAGNOSIS — M546 Pain in thoracic spine: Secondary | ICD-10-CM | POA: Diagnosis not present

## 2017-02-23 DIAGNOSIS — M9902 Segmental and somatic dysfunction of thoracic region: Secondary | ICD-10-CM | POA: Diagnosis not present

## 2017-02-24 ENCOUNTER — Other Ambulatory Visit: Payer: Self-pay | Admitting: Cardiology

## 2017-02-24 MED ORDER — AMLODIPINE BESYLATE 2.5 MG PO TABS: 2.5000 mg | ORAL_TABLET | Freq: Every day | ORAL | 2 refills | Status: DC

## 2017-02-27 DIAGNOSIS — M9902 Segmental and somatic dysfunction of thoracic region: Secondary | ICD-10-CM | POA: Diagnosis not present

## 2017-02-27 DIAGNOSIS — M542 Cervicalgia: Secondary | ICD-10-CM | POA: Diagnosis not present

## 2017-02-27 DIAGNOSIS — M546 Pain in thoracic spine: Secondary | ICD-10-CM | POA: Diagnosis not present

## 2017-02-27 DIAGNOSIS — M9901 Segmental and somatic dysfunction of cervical region: Secondary | ICD-10-CM | POA: Diagnosis not present

## 2017-03-01 DIAGNOSIS — M9901 Segmental and somatic dysfunction of cervical region: Secondary | ICD-10-CM | POA: Diagnosis not present

## 2017-03-01 DIAGNOSIS — M542 Cervicalgia: Secondary | ICD-10-CM | POA: Diagnosis not present

## 2017-03-01 DIAGNOSIS — M9902 Segmental and somatic dysfunction of thoracic region: Secondary | ICD-10-CM | POA: Diagnosis not present

## 2017-03-01 DIAGNOSIS — M546 Pain in thoracic spine: Secondary | ICD-10-CM | POA: Diagnosis not present

## 2017-03-02 DIAGNOSIS — M9902 Segmental and somatic dysfunction of thoracic region: Secondary | ICD-10-CM | POA: Diagnosis not present

## 2017-03-02 DIAGNOSIS — M546 Pain in thoracic spine: Secondary | ICD-10-CM | POA: Diagnosis not present

## 2017-03-02 DIAGNOSIS — M542 Cervicalgia: Secondary | ICD-10-CM | POA: Diagnosis not present

## 2017-03-02 DIAGNOSIS — M9901 Segmental and somatic dysfunction of cervical region: Secondary | ICD-10-CM | POA: Diagnosis not present

## 2017-03-03 ENCOUNTER — Encounter: Payer: Self-pay | Admitting: Cardiology

## 2017-03-06 DIAGNOSIS — M9901 Segmental and somatic dysfunction of cervical region: Secondary | ICD-10-CM | POA: Diagnosis not present

## 2017-03-06 DIAGNOSIS — M546 Pain in thoracic spine: Secondary | ICD-10-CM | POA: Diagnosis not present

## 2017-03-06 DIAGNOSIS — M542 Cervicalgia: Secondary | ICD-10-CM | POA: Diagnosis not present

## 2017-03-06 DIAGNOSIS — M9902 Segmental and somatic dysfunction of thoracic region: Secondary | ICD-10-CM | POA: Diagnosis not present

## 2017-03-07 DIAGNOSIS — J209 Acute bronchitis, unspecified: Secondary | ICD-10-CM | POA: Diagnosis not present

## 2017-03-08 DIAGNOSIS — M542 Cervicalgia: Secondary | ICD-10-CM | POA: Diagnosis not present

## 2017-03-08 DIAGNOSIS — M9902 Segmental and somatic dysfunction of thoracic region: Secondary | ICD-10-CM | POA: Diagnosis not present

## 2017-03-08 DIAGNOSIS — M546 Pain in thoracic spine: Secondary | ICD-10-CM | POA: Diagnosis not present

## 2017-03-08 DIAGNOSIS — M9901 Segmental and somatic dysfunction of cervical region: Secondary | ICD-10-CM | POA: Diagnosis not present

## 2017-03-09 DIAGNOSIS — M542 Cervicalgia: Secondary | ICD-10-CM | POA: Diagnosis not present

## 2017-03-09 DIAGNOSIS — M546 Pain in thoracic spine: Secondary | ICD-10-CM | POA: Diagnosis not present

## 2017-03-09 DIAGNOSIS — M9902 Segmental and somatic dysfunction of thoracic region: Secondary | ICD-10-CM | POA: Diagnosis not present

## 2017-03-09 DIAGNOSIS — M9901 Segmental and somatic dysfunction of cervical region: Secondary | ICD-10-CM | POA: Diagnosis not present

## 2017-03-13 DIAGNOSIS — M9901 Segmental and somatic dysfunction of cervical region: Secondary | ICD-10-CM | POA: Diagnosis not present

## 2017-03-13 DIAGNOSIS — M9902 Segmental and somatic dysfunction of thoracic region: Secondary | ICD-10-CM | POA: Diagnosis not present

## 2017-03-13 DIAGNOSIS — M542 Cervicalgia: Secondary | ICD-10-CM | POA: Diagnosis not present

## 2017-03-13 DIAGNOSIS — M546 Pain in thoracic spine: Secondary | ICD-10-CM | POA: Diagnosis not present

## 2017-03-16 DIAGNOSIS — M9901 Segmental and somatic dysfunction of cervical region: Secondary | ICD-10-CM | POA: Diagnosis not present

## 2017-03-16 DIAGNOSIS — M546 Pain in thoracic spine: Secondary | ICD-10-CM | POA: Diagnosis not present

## 2017-03-16 DIAGNOSIS — M9902 Segmental and somatic dysfunction of thoracic region: Secondary | ICD-10-CM | POA: Diagnosis not present

## 2017-03-16 DIAGNOSIS — M542 Cervicalgia: Secondary | ICD-10-CM | POA: Diagnosis not present

## 2017-03-17 DIAGNOSIS — M51369 Other intervertebral disc degeneration, lumbar region without mention of lumbar back pain or lower extremity pain: Secondary | ICD-10-CM

## 2017-03-17 DIAGNOSIS — M5136 Other intervertebral disc degeneration, lumbar region: Secondary | ICD-10-CM

## 2017-03-17 HISTORY — DX: Other intervertebral disc degeneration, lumbar region without mention of lumbar back pain or lower extremity pain: M51.369

## 2017-03-17 HISTORY — DX: Other intervertebral disc degeneration, lumbar region: M51.36

## 2017-03-20 DIAGNOSIS — M546 Pain in thoracic spine: Secondary | ICD-10-CM | POA: Diagnosis not present

## 2017-03-20 DIAGNOSIS — M542 Cervicalgia: Secondary | ICD-10-CM | POA: Diagnosis not present

## 2017-03-20 DIAGNOSIS — M9902 Segmental and somatic dysfunction of thoracic region: Secondary | ICD-10-CM | POA: Diagnosis not present

## 2017-03-20 DIAGNOSIS — M9901 Segmental and somatic dysfunction of cervical region: Secondary | ICD-10-CM | POA: Diagnosis not present

## 2017-03-22 DIAGNOSIS — M546 Pain in thoracic spine: Secondary | ICD-10-CM | POA: Diagnosis not present

## 2017-03-22 DIAGNOSIS — M542 Cervicalgia: Secondary | ICD-10-CM | POA: Diagnosis not present

## 2017-03-22 DIAGNOSIS — M9902 Segmental and somatic dysfunction of thoracic region: Secondary | ICD-10-CM | POA: Diagnosis not present

## 2017-03-22 DIAGNOSIS — M9901 Segmental and somatic dysfunction of cervical region: Secondary | ICD-10-CM | POA: Diagnosis not present

## 2017-03-29 DIAGNOSIS — M9902 Segmental and somatic dysfunction of thoracic region: Secondary | ICD-10-CM | POA: Diagnosis not present

## 2017-03-29 DIAGNOSIS — I10 Essential (primary) hypertension: Secondary | ICD-10-CM | POA: Diagnosis not present

## 2017-03-29 DIAGNOSIS — M546 Pain in thoracic spine: Secondary | ICD-10-CM | POA: Diagnosis not present

## 2017-03-29 DIAGNOSIS — M542 Cervicalgia: Secondary | ICD-10-CM | POA: Diagnosis not present

## 2017-03-29 DIAGNOSIS — M9901 Segmental and somatic dysfunction of cervical region: Secondary | ICD-10-CM | POA: Diagnosis not present

## 2017-04-05 DIAGNOSIS — M5416 Radiculopathy, lumbar region: Secondary | ICD-10-CM | POA: Diagnosis not present

## 2017-04-11 ENCOUNTER — Other Ambulatory Visit: Payer: Self-pay

## 2017-04-11 ENCOUNTER — Encounter: Payer: Self-pay | Admitting: Family

## 2017-04-11 ENCOUNTER — Ambulatory Visit (HOSPITAL_COMMUNITY)
Admission: RE | Admit: 2017-04-11 | Discharge: 2017-04-11 | Disposition: A | Discharge status: Home / Self Care | Payer: PPO | Source: Ambulatory Visit | Attending: Family | Admitting: Family

## 2017-04-11 ENCOUNTER — Ambulatory Visit: Payer: PPO | Admitting: Family

## 2017-04-11 VITALS — BP 138/67 | HR 46 | Temp 96.7°F | Resp 18 | Ht 73.0 in | Wt 193.0 lb

## 2017-04-11 DIAGNOSIS — I6521 Occlusion and stenosis of right carotid artery: Secondary | ICD-10-CM | POA: Diagnosis not present

## 2017-04-11 DIAGNOSIS — I6523 Occlusion and stenosis of bilateral carotid arteries: Secondary | ICD-10-CM

## 2017-04-11 DIAGNOSIS — Z87891 Personal history of nicotine dependence: Secondary | ICD-10-CM | POA: Diagnosis not present

## 2017-04-11 DIAGNOSIS — I739 Peripheral vascular disease, unspecified: Secondary | ICD-10-CM | POA: Diagnosis not present

## 2017-04-11 DIAGNOSIS — I724 Aneurysm of artery of lower extremity: Secondary | ICD-10-CM | POA: Diagnosis not present

## 2017-04-11 NOTE — Patient Instructions (Signed)

## 2017-04-11 NOTE — Progress Notes (Signed)
Chief Complaint: Follow up Extracranial Carotid Artery Stenosis   History of Present Illness  [name] is a 77 y.o. male who is status post aortobifemoral bypass for occlusive disease in 2003. He had undergone left carotid endarterectomy by Dr. Donnetta Hutching on 03/11/2015 for symptomatic left carotid disease. His studies at that time suggested moderate right carotid stenosis as well.   Dr. Donnetta Hutching last evaluated pt on 04-05-16. At that time carotid duplex showed widely patent endarterectomy and no severe stenosis in the right carotid artery, one year carotid duplex follow-up.  He had a CABG in December 2017.  He had a TIA as manifested by transient right arm weakness and expressive aphasia, preoperative to the left CEA, has not had any subsequent TIA or stroke.   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  Both calves claudicate after walking 500 feet on a flat surface, relieved by a short rest.   He has known right sciatica, is seeing a chiropractor.   His hips have been evaluated by [facility] ortho for pain with walking, the pain radiates distally at the posterior aspects of both hips; he sees a physical therapist tomorrow for evaluation.   Diabetic: no Tobacco use: former smoker, quit in 1980, smoked x 30 years  Pt meds include: Statin : yes ASA: no Other anticoagulants/antiplatelets: Eliquis, he has a hx of PAF   Past Medical History:  Diagnosis Date  . Arthritis    HANDS AND FEET  . Asymptomatic carotid artery stenosis BILATERAL   PER DOPPLER  03/2016  RICA  1 - 39%/  s/p  L CEA  1-39%- followed by Dr. Donnetta Hutching  . CAD (coronary artery disease)    s/p remote stenting, s/p CABG with LIMA to LAD, SVG to RCA and SVG to LCx 12/2015  . Cataract immature BILATERAL   . Diverticulosis   . History of AAA (abdominal aortic aneurysm) repair 2000    W/ AORTOVIFEMORAL BYPASS  . History of diverticulosis    Noted on Colonoscopy  . History of inguinal herniorrhaphy   . Hydrocele, left   . Hyperlipidemia   . Hypertension   . Increased intraocular pressure   . Nocturia   . Paroxysmal atrial fibrillation ([place]) 06/30/14   chad2vasc score is at least 4  . 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 Retinal detachment   . Stroke [facility])     Social History Social History   Tobacco Use  . Smoking status: Former Smoker    Years: 30.00    Types: Cigarettes    Last attempt to quit: 01/18/1978    Years since quitting: 39.2  . Smokeless tobacco: Former Systems developer    Quit date: 1980  Substance Use Topics  . Alcohol use: Yes    Alcohol/week: 13.2 oz    Types: 1 Shots of liquor, 21 Standard drinks or equivalent per week    Comment: 2 oz of vodka per day  . Drug use: No    Family History Family History  Problem Relation Age of Onset  . Heart disease Mother        AAA  Before age 5  . Cancer Mother 77       Colon cancer  . Hyperlipidemia Mother   . Heart failure Father     Surgical History Past Surgical History:  Procedure Laterality Date  . AAA REPAIR W/ AORTOBIFEMORAL BYPASS  OCT  03500  . ABDOMINAL HERNIA REPAIR  X4  (LAST ONE 1990'S)   inguinal herniorrhaphy  . CARDIAC CATHETERIZATION N/A 12/16/2015   Procedure: Left Heart Cath and Coronary Angiography;  Surgeon: Wellington Hampshire, MD;  Location: [place] [facility];  Service: Cardiovascular;  Laterality: N/A;  . CAROTID ENDARTERECTOMY    . CARPAL TUNNEL RELEASE Right Oct. 2013   Hand  . CORONARY ANGIOPLASTY  2002   PTCA  W/ X2 BM STENTS--   PROXIMA LAD  &  LEFT CIRCUMFLEX TO FIRST OBTUSE MARGINAL BRANCH  . CORONARY ARTERY BYPASS GRAFT N/A 12/18/2015   Procedure: CORONARY ARTERY BYPASS GRAFTING (CABG) x 3, using left internal mammary artery and bilateral   leg greater saphenous vein harvested endoscopically  LIMA to LAD, SVG to Circumflex, SVG to RCA;  Surgeon: Grace Isaac, MD;   Location: [place];  Service: Open Heart Surgery;  Laterality: N/A;  . ENDARTERECTOMY Left 03/11/2015   Procedure: ENDARTERECTOMY LEFT CAROTID;  Surgeon: Rosetta Posner, MD;  Location: [place];  Service: Vascular;  Laterality: Left;  . EXPLORATORY LAPAROTOMY W/ ENTEROLYSIS FOR PARTIAL SBO  09-23-2008  . heart stents  02/19/00  . HYDROCELE EXCISION  01/24/2011   Procedure: HYDROCELECTOMY ADULT;  Surgeon: Bernestine Amass, MD;  Location[facility];  Service: Urology;  Laterality: Left;  . Ingrown Toe Nail Right Jan. 2015   Right Great Toe nail  . intest blockage  09/22/08  . PATCH ANGIOPLASTY Left 03/11/2015   Procedure: PATCH ANGIOPLASTY LEFT CAROTID USING HEMASHIELD PLATINUM FINESSE PATCH;  Surgeon: Rosetta Posner, MD;  Location: [place];  Service: Vascular;  Laterality: Left;  . PULLEY RELEASE -- RIGHT 5TH FINGER  2009  . RETINAL DETACHMENT SURGERY  2004   BILATERAL  . TEE WITHOUT CARDIOVERSION N/A 12/18/2015   Procedure: TRANSESOPHAGEAL ECHOCARDIOGRAM (TEE);  Surgeon: Grace Isaac, MD;  Location: [place];  Service: Open Heart Surgery;  Laterality: N/A;  . TOOTH EXTRACTION  Dec 2013    Allergies  Allergen Reactions  . Wasp Venom Shortness Of Breath and Swelling    Current Outpatient Medications  Medication Sig Dispense Refill  . acetaminophen (TYLENOL) 500 MG tablet Take 2 tablets (1,000 mg total) by mouth every 6 (six) hours as needed. 30 tablet 0  . amiodarone (PACERONE) 200 MG tablet Take 1 tablet (200 mg total) by mouth daily. 90 tablet 3  . amLODipine (NORVASC) 5 MG tablet Take 1 tablet (5 mg total) daily by mouth. 90 tablet 1  . apixaban (ELIQUIS) 5 MG TABS tablet Take 1 tablet (5 mg total) by mouth 2 (two) times daily. 60 tablet 6  . b complex vitamins tablet Take 1 tablet by mouth daily.      . calcium carbonate (OS-CAL) 600 MG TABS Take 600 mg by mouth daily.      . Cholecalciferol 1000 UNITS capsule Take 2,000 Units by mouth daily.     . cloNIDine (CATAPRES) 0.1 MG tablet  TAKE 1 TO 2 TABLETS BY MOUTH AT BEDTIME EVERY DAY  5  . Coenzyme Q10 (CO Q 10) 100 MG CAPS Take 1 capsule by mouth daily.    . cyanocobalamin 1000 MCG tablet Take 1,000 mcg by mouth daily.    . finasteride (PROSCAR) 5 MG tablet Take 5 mg by mouth daily.    . fluorouracil (EFUDEX) 5 % cream APPLY NN THE SKIN TWICE A DAY TO AFFECTED AREAS TWICE A DAY X2 WEEKS AS DIRECTED  0  .  hydrochlorothiazide (HYDRODIURIL) 12.5 MG tablet     . hydrochlorothiazide (MICROZIDE) 12.5 MG capsule Take 12.5 mg by mouth daily.    [name] losartan (COZAAR) 100 MG tablet     . losartan (COZAAR) 25 MG tablet Take 1 tablet (25 mg total) by mouth daily. 90 tablet 3  . Magnesium Hydroxide (MAGNESIA PO) Take 1,000 mg by mouth daily.     . Multiple Vitamin (MULTIVITAMIN) tablet Take 1 tablet by mouth daily.      . Omega-3 Fatty Acids (FISH OIL PO) Take 1,200 mg by mouth daily.    . potassium gluconate 595 (99 K) MG TABS tablet Take 595 mg by mouth.    . rosuvastatin (CRESTOR) 40 MG tablet Take 1 tablet (40 mg total) by mouth every evening. 90 tablet 3  . Tamsulosin HCl (FLOMAX) 0.4 MG CAPS Take 0.4 mg by mouth daily.      . vitamin C (ASCORBIC ACID) 500 MG tablet Take 2,000 mg by mouth daily.    [name] amLODipine (NORVASC) 2.5 MG tablet Take 1 tablet (2.5 mg total) by mouth daily. (Patient not taking: Reported on 04/11/2017) 90 tablet 2  . azithromycin (ZITHROMAX) 250 MG tablet TAKE 2 TABLETS BY MOUTH TODAY, THEN TAKE 1 TABLET DAILY FOR 4 DAYS  0  . EPINEPHrine (EPIPEN) 0.3 mg/0.3 mL SOAJ injection Inject 0.3 mg into the muscle daily as needed (allergic reaction).      No current facility-administered medications for this visit.     Review of Systems : See HPI for pertinent positives and negatives.  Physical Examination  Vitals:   04/11/17 1114 04/11/17 1116  BP: 140/62 138/67  Pulse: (!) 46 (!) 46  Resp: 18   Temp: (!) 96.7 F (35.9 C)   TempSrc: Oral   SpO2: 99%   Weight: 193 lb (87.5 kg)   Height: 6\' 1"  (1.854 m)     Body mass index is 25.46 kg/m.  General: WDWN male in NAD GAIT: normal Eyes: PERRLA HENT: No gross abnormalities.  Pulmonary:  Respirations are non-labored, good air movement, CTAB, no rales, rhonchi, or wheezing. Cardiac: regular rhythm, no detected murmur.  VASCULAR EXAM Carotid Bruits Right Left   Positive Negative     Abdominal aortic pulse is not palpable. Radial pulses are 3+ palpable and equal.                                                                                                                            LE Pulses Right Left       FEMORAL  3+ palpable  3+ palpable        POPLITEAL  not palpable   not palpable       POSTERIOR TIBIAL  not palpable   not palpable        DORSALIS PEDIS      ANTERIOR TIBIAL faintly palpable  not palpable     Gastrointestinal: soft, nontender, BS WNL, no r/g, no palpable masses. Old scar  of midline abdominal incision. Musculoskeletal: Negative muscle atrophy/wasting. M/S 5/5 throughout, extremities without ischemic changes. Skin: No rashes, no ulcers, no cellulitis.   Neurologic:  A&O X 3; appropriate affect, sensation is normal; speech is normal, CN 2-12 intact, pain and light touch intact in extremities, motor exam as listed above. Psychiatric: Normal thought content, mood appropriate to clinical situation.     Assessment: [name] is a 77 y.o. male who is status post aortobifemoral bypass for occlusive disease in 2003. He had undergone left carotid endarterectomy by Dr. Donnetta Hutching on 03/11/2015.   He had a TIA as manifested by transient right arm weakness and expressive aphasia, preoperative to the left CEA, has not had any subsequent TIA or stroke.   He has bilateral popliteal artery aneurysms;  popliteal artery duplex in 2015 shows 0.96cm right popliteal artery and 1.1 cm left popiteal artery diameters.    Both calves claudicate after walking 500 feet on a flat surface, relieved by a short rest.  Last and only  ABI result on file was from 2009 at another faciltity: right was 0.68, left was 0.56.     DATA Carotid Duplex (04/11/17): Right ICA: 1-39% stenosis. Left ICA: CEA site, no restenosis Bilateral vertebral artery flow is antegrade.  Bilateral subclavian artery waveforms are normal.  No significant change compared to the exam on 04-05-16.    Plan: Follow-up in 1 year with Carotid Duplex scan, bilateral popliteal artery duplex, and ABI's.  I discussed in depth with the patient the nature of atherosclerosis, and emphasized the importance of maximal medical management including strict control of blood pressure, blood glucose, and lipid levels, obtaining regular exercise, and continued cessation of smoking.  The patient is aware that without maximal medical management the underlying atherosclerotic disease process will progress, limiting the benefit of any interventions. The patient was given information about stroke prevention and what symptoms should prompt the patient to seek immediate medical care. Thank you for allowing [place] to participate in this patient's care.  Clemon Chambers, RN, MSN, FNP-C [facility] Office: [phone]  Clinic Physician: Early  04/11/17 11:48 AM

## 2017-05-04 DIAGNOSIS — M9901 Segmental and somatic dysfunction of cervical region: Secondary | ICD-10-CM | POA: Diagnosis not present

## 2017-05-04 DIAGNOSIS — M546 Pain in thoracic spine: Secondary | ICD-10-CM | POA: Diagnosis not present

## 2017-05-04 DIAGNOSIS — M542 Cervicalgia: Secondary | ICD-10-CM | POA: Diagnosis not present

## 2017-05-04 DIAGNOSIS — M9902 Segmental and somatic dysfunction of thoracic region: Secondary | ICD-10-CM | POA: Diagnosis not present

## 2017-05-09 DIAGNOSIS — M542 Cervicalgia: Secondary | ICD-10-CM | POA: Diagnosis not present

## 2017-05-09 DIAGNOSIS — M546 Pain in thoracic spine: Secondary | ICD-10-CM | POA: Diagnosis not present

## 2017-05-09 DIAGNOSIS — M9901 Segmental and somatic dysfunction of cervical region: Secondary | ICD-10-CM | POA: Diagnosis not present

## 2017-05-09 DIAGNOSIS — M9902 Segmental and somatic dysfunction of thoracic region: Secondary | ICD-10-CM | POA: Diagnosis not present

## 2017-05-11 DIAGNOSIS — M542 Cervicalgia: Secondary | ICD-10-CM | POA: Diagnosis not present

## 2017-05-11 DIAGNOSIS — M9902 Segmental and somatic dysfunction of thoracic region: Secondary | ICD-10-CM | POA: Diagnosis not present

## 2017-05-11 DIAGNOSIS — M546 Pain in thoracic spine: Secondary | ICD-10-CM | POA: Diagnosis not present

## 2017-05-11 DIAGNOSIS — M9901 Segmental and somatic dysfunction of cervical region: Secondary | ICD-10-CM | POA: Diagnosis not present

## 2017-05-25 DIAGNOSIS — M5136 Other intervertebral disc degeneration, lumbar region: Secondary | ICD-10-CM | POA: Insufficient documentation

## 2017-06-07 DIAGNOSIS — I4891 Unspecified atrial fibrillation: Secondary | ICD-10-CM | POA: Diagnosis not present

## 2017-06-07 DIAGNOSIS — I119 Hypertensive heart disease without heart failure: Secondary | ICD-10-CM | POA: Diagnosis not present

## 2017-06-07 DIAGNOSIS — M5386 Other specified dorsopathies, lumbar region: Secondary | ICD-10-CM | POA: Diagnosis not present

## 2017-06-07 DIAGNOSIS — I70219 Atherosclerosis of native arteries of extremities with intermittent claudication, unspecified extremity: Secondary | ICD-10-CM | POA: Diagnosis not present

## 2017-06-07 DIAGNOSIS — I6529 Occlusion and stenosis of unspecified carotid artery: Secondary | ICD-10-CM | POA: Diagnosis not present

## 2017-06-07 DIAGNOSIS — E559 Vitamin D deficiency, unspecified: Secondary | ICD-10-CM | POA: Diagnosis not present

## 2017-06-07 DIAGNOSIS — I714 Abdominal aortic aneurysm, without rupture: Secondary | ICD-10-CM | POA: Diagnosis not present

## 2017-06-07 DIAGNOSIS — I10 Essential (primary) hypertension: Secondary | ICD-10-CM | POA: Diagnosis not present

## 2017-06-07 DIAGNOSIS — I63512 Cerebral infarction due to unspecified occlusion or stenosis of left middle cerebral artery: Secondary | ICD-10-CM | POA: Diagnosis not present

## 2017-06-07 DIAGNOSIS — Z0000 Encounter for general adult medical examination without abnormal findings: Secondary | ICD-10-CM | POA: Diagnosis not present

## 2017-06-07 DIAGNOSIS — Z79899 Other long term (current) drug therapy: Secondary | ICD-10-CM | POA: Diagnosis not present

## 2017-06-13 ENCOUNTER — Telehealth: Payer: Self-pay

## 2017-06-13 NOTE — Telephone Encounter (Addendum)
Pt takes Eliquis for afib with CHADS2VASc score of 5 (age x2, HTN, CAD, CVA). CVA occurred 02/21/15 and was per neurology, was "thought to favor left ICA high grade stenosis instead of pAfib due to left ICA stenosis." Pt has been taking Eliquis since 2016. CrCl is 78mL/min.  Per protocol, patients are to hold Eliquis for 3 days prior to spinal injections. Given history of stroke, will defer to Dr Radford Pax regarding appropriate length of time to hold Eliquis prior to [facility].

## 2017-06-13 NOTE — Telephone Encounter (Signed)
I am fine with him holding DOAC for 3 days prior to procedure

## 2017-06-13 NOTE — Telephone Encounter (Signed)
   [facility] Pre-operative Risk Assessment    Request for surgical clearance:  1. What type of surgery is being performed? Lumbar EST   2. When is this surgery scheduled? 06/27/17   3. What type of clearance is required (medical clearance vs. Pharmacy clearance to hold med vs. Both)? Medical  4. Are there any medications that need to be held prior to surgery and how long? Eliquis 3 days   5. Practice name and name of physician performing surgery? [facility] unknown   6. What is your office phone number [phone]    7.   What is your office fax number [phone]  8.   Anesthesia type (None, local, MAC, general) ? none   [name] 06/13/2017, 7:32 AM  _________________________________________________________________   (provider comments below)

## 2017-06-13 NOTE — Telephone Encounter (Signed)
Routing to pharmacy for Eliquis recs  

## 2017-06-14 NOTE — Telephone Encounter (Signed)
Dr. Radford Pax, see note below. Just wanted to make mention when I asked about HR, patient states his HR chronically is in the mid 40s-50s going back many years. This was present in the last OVs in [place]. He feels perfectly fine. Just would like your input on whether you would leave regimen as is. I did not feel this would impact his surgical clearance, but just wanted your review. Thanks! You can route your reply directly to me. Tonye Tancredi PA-C

## 2017-06-14 NOTE — Telephone Encounter (Signed)
   Primary Cardiologist: Fransico Him, MD  Chart reviewed as part of pre-operative protocol coverage. Patient was contacted 06/14/2017 in reference to pre-operative risk assessment for pending surgery as outlined below.  [name] was last seen 10/2016 with Dr. Radford Pax. H/o CAD s/p CABG 2017, post-op AF on amio,  HTN, dyslipidemia, PAF on chronic anticoagulation with Eliquis, CVA, carotid artery stenosis s/p  L CEA, AAA s/p repair followed by vascular. Since that day, [name] has done very well. He is extremely active in the yard, does heavy lifting and has not had any functional limitation - no CP, SOB, palpitations, edema, dizziness or syncope. He states "I feel excellent" - aside from the chronic back pain prompting ESI recommendation. Therefore, based on ACC/AHA guidelines, the patient would be at acceptable risk for the planned procedure without further cardiovascular testing. I did notice he had several med duplicates on his med list - he clarified he is on amlodipine 5mg , losartan 100mg  daily, and HCTZ 12.5mg  daily. He states he just had physical with Dr. Inda Merlin and it went fantastic and he got a good report.  Regarding pre-op blood thinner recommendations, anticoagulation question was reviewed by Dr. Radford Pax and pharmD Supple who feel per protocol, "patient may hold Eliquis for 3 days prior procedure." Recommend to resume as soon as felt safe by performing provider given patient's h/o CVA.  I will route this recommendation to the requesting party via Epic fax function and remove from pre-op pool.  Please call with questions.  Charlie Pitter, PA-C 06/14/2017, 2:44 PM

## 2017-06-15 NOTE — Telephone Encounter (Signed)
Reviewed with Dr. Radford Pax, no changes needed. Kimberl Vig PA-C

## 2017-06-21 DIAGNOSIS — I119 Hypertensive heart disease without heart failure: Secondary | ICD-10-CM | POA: Diagnosis not present

## 2017-06-21 DIAGNOSIS — I701 Atherosclerosis of renal artery: Secondary | ICD-10-CM | POA: Diagnosis not present

## 2017-06-21 DIAGNOSIS — M170 Bilateral primary osteoarthritis of knee: Secondary | ICD-10-CM | POA: Diagnosis not present

## 2017-06-21 DIAGNOSIS — I2510 Atherosclerotic heart disease of native coronary artery without angina pectoris: Secondary | ICD-10-CM | POA: Diagnosis not present

## 2017-06-21 DIAGNOSIS — N400 Enlarged prostate without lower urinary tract symptoms: Secondary | ICD-10-CM | POA: Diagnosis not present

## 2017-06-21 DIAGNOSIS — I10 Essential (primary) hypertension: Secondary | ICD-10-CM | POA: Diagnosis not present

## 2017-06-21 DIAGNOSIS — I4891 Unspecified atrial fibrillation: Secondary | ICD-10-CM | POA: Diagnosis not present

## 2017-06-21 DIAGNOSIS — E7800 Pure hypercholesterolemia, unspecified: Secondary | ICD-10-CM | POA: Diagnosis not present

## 2017-06-27 DIAGNOSIS — M5136 Other intervertebral disc degeneration, lumbar region: Secondary | ICD-10-CM | POA: Diagnosis not present

## 2017-06-30 DIAGNOSIS — H1849 Other corneal degeneration: Secondary | ICD-10-CM | POA: Diagnosis not present

## 2017-06-30 DIAGNOSIS — H35033 Hypertensive retinopathy, bilateral: Secondary | ICD-10-CM | POA: Diagnosis not present

## 2017-06-30 DIAGNOSIS — H16103 Unspecified superficial keratitis, bilateral: Secondary | ICD-10-CM | POA: Diagnosis not present

## 2017-06-30 DIAGNOSIS — Z961 Presence of intraocular lens: Secondary | ICD-10-CM | POA: Diagnosis not present

## 2017-06-30 DIAGNOSIS — H52223 Regular astigmatism, bilateral: Secondary | ICD-10-CM | POA: Diagnosis not present

## 2017-06-30 DIAGNOSIS — H5203 Hypermetropia, bilateral: Secondary | ICD-10-CM | POA: Diagnosis not present

## 2017-06-30 DIAGNOSIS — I10 Essential (primary) hypertension: Secondary | ICD-10-CM | POA: Diagnosis not present

## 2017-07-11 DIAGNOSIS — D692 Other nonthrombocytopenic purpura: Secondary | ICD-10-CM | POA: Diagnosis not present

## 2017-07-11 DIAGNOSIS — L814 Other melanin hyperpigmentation: Secondary | ICD-10-CM | POA: Diagnosis not present

## 2017-07-11 DIAGNOSIS — L570 Actinic keratosis: Secondary | ICD-10-CM | POA: Diagnosis not present

## 2017-08-21 ENCOUNTER — Other Ambulatory Visit: Payer: Self-pay

## 2017-08-21 ENCOUNTER — Ambulatory Visit: Payer: PPO | Admitting: Family

## 2017-08-21 ENCOUNTER — Encounter: Payer: Self-pay | Admitting: Family

## 2017-08-21 VITALS — BP 126/75 | HR 47 | Temp 97.4°F | Resp 18 | Ht 73.0 in | Wt 188.0 lb

## 2017-08-21 DIAGNOSIS — I6523 Occlusion and stenosis of bilateral carotid arteries: Secondary | ICD-10-CM | POA: Diagnosis not present

## 2017-08-21 DIAGNOSIS — I724 Aneurysm of artery of lower extremity: Secondary | ICD-10-CM

## 2017-08-21 DIAGNOSIS — M25472 Effusion, left ankle: Secondary | ICD-10-CM | POA: Diagnosis not present

## 2017-08-21 DIAGNOSIS — M25471 Effusion, right ankle: Secondary | ICD-10-CM

## 2017-08-21 DIAGNOSIS — I779 Disorder of arteries and arterioles, unspecified: Secondary | ICD-10-CM | POA: Diagnosis not present

## 2017-08-21 NOTE — Patient Instructions (Signed)
  To decrease swelling in your feet and legs: Elevate feet above slightly bent knees, feet above heart, overnight and 3-4 times per day for 20 minutes.    Peripheral Vascular Disease Peripheral vascular disease (PVD) is a disease of the blood vessels that are not part of your heart and brain. A simple term for PVD is poor circulation. In most cases, PVD narrows the blood vessels that carry blood from your heart to the rest of your body. This can result in a decreased supply of blood to your arms, legs, and internal organs, like your stomach or kidneys. However, it most often affects a person's lower legs and feet. There are two types of PVD.  Organic PVD. This is the more common type. It is caused by damage to the structure of blood vessels.  Functional PVD. This is caused by conditions that make blood vessels contract and tighten (spasm).  Without treatment, PVD tends to get worse over time. PVD can also lead to acute ischemic limb. This is when an arm or limb suddenly has trouble getting enough blood. This is a medical emergency. Follow these instructions at home:  Take medicines only as told by your doctor.  Do not use any tobacco products, including cigarettes, chewing tobacco, or electronic cigarettes. If you need help quitting, ask your doctor.  Lose weight if you are overweight, and maintain a healthy weight as told by your doctor.  Eat a diet that is low in fat and cholesterol. If you need help, ask your doctor.  Exercise regularly. Ask your doctor for some good activities for you.  Take good care of your feet. ? Wear comfortable shoes that fit well. ? Check your feet often for any cuts or sores. Contact a doctor if:  You have cramps in your legs while walking.  You have leg pain when you are at rest.  You have coldness in a leg or foot.  Your skin changes.  You are unable to get or have an erection (erectile dysfunction).  You have cuts or sores on your feet that  are not healing. Get help right away if:  Your arm or leg turns cold and blue.  Your arms or legs become red, warm, swollen, painful, or numb.  You have chest pain or trouble breathing.  You suddenly have weakness in your face, arm, or leg.  You become very confused or you cannot speak.  You suddenly have a very bad headache.  You suddenly cannot see. This information is not intended to replace advice given to you by your health care provider. Make sure you discuss any questions you have with your health care provider. Document Released: 03/30/2009 Document Revised: 06/11/2015 Document Reviewed: 06/13/2013 Elsevier Interactive Patient Education  2017 [employer]  

## 2017-08-21 NOTE — Progress Notes (Signed)
[facility] [place]   CC:  Ankle and feet swelling 7-10 days after starting amlodipine, with history of  peripheral artery occlusive disease  History of Present Illness [name] is a 77 y.o. male who is status post aortobifemoral bypass for occlusive disease in 2003. He had undergone left carotid endarterectomy by Dr. Donnetta Hutching on 03/11/2015 for symptomatic left carotid disease. His studies at that time suggested moderate right carotid stenosis as well.   He returns today with concern for ankle and feet swelling that started 7-10 days after he started taking amlodipine. He is asking if this medication has adverse effects on his circulation in his legs. He is playing golf.  After walking up a steep grade both calves hurt, on level ground after walking a half mile, this has not changed.  He denies dyspnea.  He feels well.   Dr. Donnetta Hutching last evaluated pt on 04-05-16. At that time carotid duplex showed widely patent endarterectomy and no severe stenosis in the right carotid artery, one year carotid duplex follow-up.  He had a CABG in December 2017.  He had a TIA as manifested by transient right arm weakness and expressive aphasia, preoperative to the left CEA, has not had any subsequent TIA or stroke.   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   He has known right sciatica, is seeing a Restaurant manager, fast food.   His hips have been evaluated by [facility] ortho for pain with walking, the pain radiates distally at the posterior aspects of both hips; he was evaluated by a physical therapist.  Diabetic: no Tobacco use: former smoker, quit in 1980, smoked x 30 years  Pt meds include: Statin : yes ASA: no Other anticoagulants/antiplatelets: Eliquis, he has a hx of PAF    Past Medical History:  Diagnosis Date  . Arthritis    HANDS  AND FEET  . Asymptomatic carotid artery stenosis BILATERAL   PER DOPPLER  03/2016  RICA  1 - 39%/  s/p  L CEA  1-39%- followed by Dr. Donnetta Hutching  . CAD (coronary artery disease)    s/p remote stenting, s/p CABG with LIMA to LAD, SVG to RCA and SVG to LCx 12/2015  . Cataract immature BILATERAL   . Diverticulosis   . 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 History of diverticulosis    Noted on Colonoscopy  . History of inguinal herniorrhaphy   . Hydrocele, left   . Hyperlipidemia   . Hypertension   . Increased intraocular pressure   . Nocturia   . Paroxysmal atrial fibrillation ([place]) 06/30/14   chad2vasc score is at least 4  . 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 Retinal detachment   . Stroke [facility])     Social History Social History   Tobacco Use  . Smoking status: Former Smoker    Years: 30.00    Types: Cigarettes    Last attempt to quit: 01/18/1978    Years since quitting: 39.6  . Smokeless tobacco: Former Systems developer    Quit date: 1980  Substance Use Topics  . Alcohol use: Yes    Alcohol/week: 13.2 oz    Types: 1 Shots of liquor, 21 Standard drinks or equivalent per week    Comment: 2 oz of vodka per day  . Drug use: No    Family History Family  History  Problem Relation Age of Onset  . Heart disease Mother        AAA  Before age 33  . Cancer Mother 30       Colon cancer  . Hyperlipidemia Mother   . Heart failure Father     Past Surgical History:  Procedure Laterality Date  . AAA REPAIR W/ AORTOBIFEMORAL BYPASS  OCT  50093  . ABDOMINAL HERNIA REPAIR  X4  (LAST ONE 1990'S)   inguinal herniorrhaphy  . CARDIAC CATHETERIZATION N/A 12/16/2015   Procedure: Left Heart Cath and Coronary Angiography;  Surgeon: Wellington Hampshire, MD;  Location: [facility];  Service: Cardiovascular;  Laterality: N/A;  . CAROTID ENDARTERECTOMY    . CARPAL TUNNEL RELEASE Right Oct. 2013   Hand  . CORONARY ANGIOPLASTY  2002   PTCA  W/ X2 BM  STENTS--   PROXIMA LAD  &  LEFT CIRCUMFLEX TO FIRST OBTUSE MARGINAL BRANCH  . CORONARY ARTERY BYPASS GRAFT N/A 12/18/2015   Procedure: CORONARY ARTERY BYPASS GRAFTING (CABG) x 3, using left internal mammary artery and bilateral   leg greater saphenous vein harvested endoscopically  LIMA to LAD, SVG to Circumflex, SVG to RCA;  Surgeon: Grace Isaac, MD;  Location: [facility];  Service: Open Heart Surgery;  Laterality: N/A;  . ENDARTERECTOMY Left 03/11/2015   Procedure: ENDARTERECTOMY LEFT CAROTID;  Surgeon: Rosetta Posner, MD;  Location: [facility];  Service: Vascular;  Laterality: Left;  . EXPLORATORY LAPAROTOMY W/ ENTEROLYSIS FOR PARTIAL SBO  09-23-2008  . heart stents  02/19/00  . HYDROCELE EXCISION  01/24/2011   Procedure: HYDROCELECTOMY ADULT;  Surgeon: Bernestine Amass, MD;  Location: [facility];  Service: Urology;  Laterality: Left;  . Ingrown Toe Nail Right Jan. 2015   Right Great Toe nail  . intest blockage  09/22/08  . PATCH ANGIOPLASTY Left 03/11/2015   Procedure: PATCH ANGIOPLASTY LEFT CAROTID USING HEMASHIELD PLATINUM FINESSE PATCH;  Surgeon: Rosetta Posner, MD;  Location: [facility];  Service: Vascular;  Laterality: Left;  . PULLEY RELEASE -- RIGHT 5TH FINGER  2009  . RETINAL DETACHMENT SURGERY  2004   BILATERAL  . TEE WITHOUT CARDIOVERSION N/A 12/18/2015   Procedure: TRANSESOPHAGEAL ECHOCARDIOGRAM (TEE);  Surgeon: Grace Isaac, MD;  Location: [place];  Service: Open Heart Surgery;  Laterality: N/A;  . TOOTH EXTRACTION  Dec 2013    Allergies  Allergen Reactions  . Wasp Venom Shortness Of Breath and Swelling  . Other     Current Outpatient Medications  Medication Sig Dispense Refill  . acetaminophen (TYLENOL) 500 MG tablet Take 2 tablets (1,000 mg total) by mouth every 6 (six) hours as needed. 30 tablet 0  . amiodarone (PACERONE) 200 MG tablet Take 1 tablet (200 mg total) by mouth daily. 90 tablet 3  . amLODipine (NORVASC) 5 MG tablet Take 1 tablet (5 mg total) daily by mouth.  90 tablet 1  . apixaban (ELIQUIS) 5 MG TABS tablet Take 1 tablet (5 mg total) by mouth 2 (two) times daily. 60 tablet 6  . azithromycin (ZITHROMAX) 250 MG tablet TAKE 2 TABLETS BY MOUTH TODAY, THEN TAKE 1 TABLET DAILY FOR 4 DAYS  0  . b complex vitamins tablet Take 1 tablet by mouth daily.      . calcium carbonate (OS-CAL) 600 MG TABS Take 600 mg by mouth daily.      . Cholecalciferol 1000 UNITS capsule Take 2,000 Units by mouth daily.     [name]  cloNIDine (CATAPRES) 0.1 MG tablet TAKE 1 TO 2 TABLETS BY MOUTH AT BEDTIME EVERY DAY  5  . Coenzyme Q10 (CO Q 10) 100 MG CAPS Take 1 capsule by mouth daily.    . cyanocobalamin 1000 MCG tablet Take 1,000 mcg by mouth daily.    [name] EPINEPHrine (EPIPEN) 0.3 mg/0.3 mL SOAJ injection Inject 0.3 mg into the muscle daily as needed (allergic reaction).     . finasteride (PROSCAR) 5 MG tablet Take 5 mg by mouth daily.    . fluorouracil (EFUDEX) 5 % cream APPLY NN THE SKIN TWICE A DAY TO AFFECTED AREAS TWICE A DAY X2 WEEKS AS DIRECTED  0  . hydrochlorothiazide (MICROZIDE) 12.5 MG capsule Take 12.5 mg by mouth daily.    [name] losartan (COZAAR) 100 MG tablet     . Magnesium Hydroxide (MAGNESIA PO) Take 1,000 mg by mouth daily.     . Multiple Vitamin (MULTIVITAMIN) tablet Take 1 tablet by mouth daily.      . Omega-3 Fatty Acids (FISH OIL PO) Take 1,200 mg by mouth daily.    . potassium gluconate 595 (99 K) MG TABS tablet Take 595 mg by mouth.    . rosuvastatin (CRESTOR) 40 MG tablet Take 1 tablet (40 mg total) by mouth every evening. 90 tablet 3  . Tamsulosin HCl (FLOMAX) 0.4 MG CAPS Take 0.4 mg by mouth daily.      . vitamin C (ASCORBIC ACID) 500 MG tablet Take 2,000 mg by mouth daily.     No current facility-administered medications for this visit.     ROS: See HPI for pertinent positives and negatives.   Physical Examination  Vitals:   08/21/17 1335  BP: 126/75  Pulse: (!) 47  Resp: 18  Temp: (!) 97.4 F (36.3 C)  TempSrc: Oral  SpO2: 98%  Weight: 188 lb  (85.3 kg)  Height: 6\' 1"  (1.854 m)   Body mass index is 24.8 kg/m.  General: WDWN male in NAD GAIT: normal Eyes: PERRLA HENT: No gross abnormalities.  Pulmonary:  Respirations are non-labored, good air movement, CTAB, no rales, rhonchi, or wheezing. Cardiac: regular rhythm, no detected murmur.  VASCULAR EXAM Carotid Bruits Right Left   Positive Negative     Abdominal aortic pulse is not palpable. Radial pulses are 2+ palpable and equal.                                                                                                                                          LE Pulses Right Left       FEMORAL  2+ palpable  2+ palpable        POPLITEAL  not palpable   not palpable       POSTERIOR TIBIAL  not palpable   faintly palpable        DORSALIS PEDIS      ANTERIOR TIBIAL faintly palpable  not  palpable     Gastrointestinal: soft, nontender, BS WNL, no r/g, no palpable masses. Old scar of midline abdominal incision. Musculoskeletal: Negative muscle atrophy/wasting. M/S 5/5 throughout, extremities without ischemic changes. Skin: No rashes, no ulcers, no cellulitis.   Neurologic:  A&O X 3; appropriate affect, sensation is normal; speech is normal, CN 2-12 intact, pain and light touch intact in extremities, motor exam as listed above. Psychiatric: Normal thought content, mood appropriate to clinical situation.      ASSESSMENT: [name] is a 77 y.o. male who is status post aortobifemoral bypass for occlusive disease in 2003. He had undergone left carotid endarterectomy by Dr. Donnetta Hutching on 03/11/2015.   He had a TIA as manifested by transient right arm weakness and expressive aphasia, preoperative to the left CEA, has not had any subsequent TIA or stroke.   He states he has bilateral popliteal artery aneurysms;  popliteal artery duplex in 2015 shows 0.96cm right popliteal artery and 1.1 cm left popiteal artery diameters; this is non aneurysmal.    Both  calves claudicate after walking 500 feet on a flat surface, relieved by a short rest.  Last and only ABI result on file was from 2009 at another faciltity: right was 0.68, left was 0.56.   I saw him in March 2019 with a carotid duplex, no peripheral arterial studies done at that time.  He returns with concern that amlodipine or clonidine, new medications he started, may be affecting his circulation, swelling in his ankles and feet started 7-10 days after starting amlodipine, which is a common side effect of amlodipine; I discussed this with him . There are no signs of ischemia in his feet or legs.   Will check ABI's and bilateral popliteal artery duplex in a month.    DATA Carotid Duplex (04/11/17): Right ICA: 1-39% stenosis. Left ICA: CEA site, no restenosis Bilateral vertebral artery flow is antegrade.  Bilateral subclavian artery waveforms are normal.  No significant change compared to the exam on 04-05-16.     Plan: Follow-up in a month with ABI's and bilateral popliteal artery duplex, and in March 2020 with Carotid Duplex scan.  I discussed in depth with the patient the nature of atherosclerosis, and emphasized the importance of maximal medical management including strict control of blood pressure, blood glucose, and lipid levels, obtaining regular exercise, and continued cessation of smoking.  The patient is aware that without maximal medical management the underlying atherosclerotic disease process will progress, limiting the benefit of any interventions.  The patient was given information about PAD including signs, symptoms, treatment, what symptoms should prompt the patient to seek immediate medical care, and risk reduction measures to take.  Clemon Chambers, RN, MSN, FNP-C [facility] Phone: [phone]  Clinic MD: Trula Slade  08/21/17 2:16 PM

## 2017-08-24 ENCOUNTER — Other Ambulatory Visit: Payer: Self-pay

## 2017-08-24 DIAGNOSIS — I6523 Occlusion and stenosis of bilateral carotid arteries: Secondary | ICD-10-CM

## 2017-08-24 DIAGNOSIS — I739 Peripheral vascular disease, unspecified: Secondary | ICD-10-CM

## 2017-08-24 DIAGNOSIS — I779 Disorder of arteries and arterioles, unspecified: Secondary | ICD-10-CM

## 2017-10-03 ENCOUNTER — Ambulatory Visit: Payer: PPO | Admitting: Physician Assistant

## 2017-10-03 ENCOUNTER — Other Ambulatory Visit: Payer: Self-pay

## 2017-10-03 ENCOUNTER — Ambulatory Visit (HOSPITAL_COMMUNITY)
Admission: RE | Admit: 2017-10-03 | Discharge: 2017-10-03 | Disposition: A | Discharge status: Home / Self Care | Payer: PPO | Source: Ambulatory Visit | Attending: Vascular Surgery | Admitting: Vascular Surgery

## 2017-10-03 ENCOUNTER — Ambulatory Visit (INDEPENDENT_AMBULATORY_CARE_PROVIDER_SITE_OTHER)
Admission: RE | Admit: 2017-10-03 | Discharge: 2017-10-03 | Disposition: A | Discharge status: Home / Self Care | Payer: PPO | Source: Ambulatory Visit | Attending: Vascular Surgery | Admitting: Vascular Surgery

## 2017-10-03 VITALS — BP 173/70 | HR 44 | Temp 97.1°F | Resp 18 | Ht 73.0 in | Wt 189.0 lb

## 2017-10-03 DIAGNOSIS — I779 Disorder of arteries and arterioles, unspecified: Secondary | ICD-10-CM

## 2017-10-03 DIAGNOSIS — I739 Peripheral vascular disease, unspecified: Secondary | ICD-10-CM

## 2017-10-03 DIAGNOSIS — E785 Hyperlipidemia, unspecified: Secondary | ICD-10-CM | POA: Diagnosis not present

## 2017-10-03 DIAGNOSIS — Z87891 Personal history of nicotine dependence: Secondary | ICD-10-CM | POA: Insufficient documentation

## 2017-10-03 DIAGNOSIS — I10 Essential (primary) hypertension: Secondary | ICD-10-CM | POA: Insufficient documentation

## 2017-10-03 DIAGNOSIS — Z95828 Presence of other vascular implants and grafts: Secondary | ICD-10-CM | POA: Insufficient documentation

## 2017-10-03 NOTE — Progress Notes (Signed)
HISTORY AND PHYSICAL     CC:  Follow up  Requesting Provider:  Josetta Huddle, MD  HPI: This is a 77 y.o. male who was seen by the NP on 08/21/17 with complaints of ankle and feet swelling that started 7-10 days after he started amlodipine.   He was recently in our office and was brought back today for ABI's and duplex of popliteal arteries.  He states that he can walk about a mile on flat land, but if there is an incline, he can walk about 0.25 mile until his legs start to ache.  He states if he stops and rests for about 30-45 seconds, the pain resolves and he can continue.  He denies any non healing wounds or rest pain.  He denies any shortness of breath or chest pain.     He has hx of aortobifemoral bypass grafting for occlusive dz in 2003.  He has also had a left CEA by Dr. Donnetta Hutching on 03/11/15 for symptomatic carotid artery stenosis (transient right arm weakness, expressive aphasia).  His studies at that time suggested moderate right carotid artery stenosis.   Most recent carotid duplex in March 2019 revealed 1-39% stenosis on the right and no evidence of stenosis on the left.  Veterbrals were antegrade.  He denies any stroke sx specifically temporary blindness, speech issues or weakness, numbness of an extremity.    He also has hx of CABG x in December 2017 by Dr. Servando Snare.  His saphenous vein was harvested from bilateral thighs.   He states that he is on Eliquis for afib, but since his OHS, he has not had any further afib.  He states that he does have a low heart rate.  He denies any dizziness or syncope.  He has an appointment with Dr. Radford Pax next month.     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  He does have sciatica and is seeing a Restaurant manager, fast food.  He underwent angiogram in 2010 by Dr. Trula Slade that revealed bilateral popliteal artery ectasia/aneurysmal disease changes and bilateral single vessel runoff via the peroneal  artery.  ABI's in 2009 at another facility were 0.68 on the right and 0.56 on the left.    He takes Eliquis and amiodarone for atrial fibrillation.  He is on a CCB, ARB for blood pressure management as well as clonidine.  The pt is on a statin for cholesterol management.   He has a remote hx of tobacco use as he quit in 1980.    Past Medical History:  Diagnosis Date  . Arthritis    HANDS AND FEET  . Asymptomatic carotid artery stenosis BILATERAL   PER DOPPLER  03/2016  RICA  1 - 39%/  s/p  L CEA  1-39%- followed by Dr. Donnetta Hutching  . CAD (coronary artery disease)    s/p remote stenting, s/p CABG with LIMA to LAD, SVG to RCA and SVG to LCx 12/2015  . Cataract immature BILATERAL   . Diverticulosis   . 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 History of diverticulosis    Noted on Colonoscopy  . History of inguinal herniorrhaphy   . Hydrocele, left   . Hyperlipidemia   . Hypertension   . Increased intraocular pressure   . Nocturia   . Paroxysmal atrial fibrillation ([place]) 06/30/14   chad2vasc score is at least 4  . Popliteal  artery aneurysm (HCC) LEFT  . Retinal detachment   . Stroke [facility])     Past Surgical History:  Procedure Laterality Date  . AAA REPAIR W/ AORTOBIFEMORAL BYPASS  OCT  16109  . ABDOMINAL HERNIA REPAIR  X4  (LAST ONE 1990'S)   inguinal herniorrhaphy  . CARDIAC CATHETERIZATION N/A 12/16/2015   Procedure: Left Heart Cath and Coronary Angiography;  Surgeon: Wellington Hampshire, MD;  Location: [place] [facility];  Service: Cardiovascular;  Laterality: N/A;  . CAROTID ENDARTERECTOMY    . CARPAL TUNNEL RELEASE Right Oct. 2013   Hand  . CORONARY ANGIOPLASTY  2002   PTCA  W/ X2 BM STENTS--   PROXIMA LAD  &  LEFT CIRCUMFLEX TO FIRST OBTUSE MARGINAL BRANCH  . CORONARY ARTERY BYPASS GRAFT N/A 12/18/2015   Procedure: CORONARY ARTERY BYPASS GRAFTING (CABG) x 3, using left internal mammary artery and bilateral   leg greater saphenous vein  harvested endoscopically  LIMA to LAD, SVG to Circumflex, SVG to RCA;  Surgeon: Grace Isaac, MD;  Location: [place];  Service: Open Heart Surgery;  Laterality: N/A;  . ENDARTERECTOMY Left 03/11/2015   Procedure: ENDARTERECTOMY LEFT CAROTID;  Surgeon: Rosetta Posner, MD;  Location: [place];  Service: Vascular;  Laterality: Left;  . EXPLORATORY LAPAROTOMY W/ ENTEROLYSIS FOR PARTIAL SBO  09-23-2008  . heart stents  02/19/00  . HYDROCELE EXCISION  01/24/2011   Procedure: HYDROCELECTOMY ADULT;  Surgeon: Bernestine Amass, MD;  Location: [facility];  Service: Urology;  Laterality: Left;  . Ingrown Toe Nail Right Jan. 2015   Right Great Toe nail  . intest blockage  09/22/08  . PATCH ANGIOPLASTY Left 03/11/2015   Procedure: PATCH ANGIOPLASTY LEFT CAROTID USING HEMASHIELD PLATINUM FINESSE PATCH;  Surgeon: Rosetta Posner, MD;  Location: [place];  Service: Vascular;  Laterality: Left;  . PULLEY RELEASE -- RIGHT 5TH FINGER  2009  . RETINAL DETACHMENT SURGERY  2004   BILATERAL  . TEE WITHOUT CARDIOVERSION N/A 12/18/2015   Procedure: TRANSESOPHAGEAL ECHOCARDIOGRAM (TEE);  Surgeon: Grace Isaac, MD;  Location: [place];  Service: Open Heart Surgery;  Laterality: N/A;  . TOOTH EXTRACTION  Dec 2013    Allergies  Allergen Reactions  . Wasp Venom Shortness Of Breath and Swelling  . Other     Current Outpatient Medications  Medication Sig Dispense Refill  . acetaminophen (TYLENOL) 500 MG tablet Take 2 tablets (1,000 mg total) by mouth every 6 (six) hours as needed. 30 tablet 0  . amiodarone (PACERONE) 200 MG tablet Take 1 tablet (200 mg total) by mouth daily. 90 tablet 3  . amLODipine (NORVASC) 5 MG tablet Take 1 tablet (5 mg total) daily by mouth. 90 tablet 1  . apixaban (ELIQUIS) 5 MG TABS tablet Take 1 tablet (5 mg total) by mouth 2 (two) times daily. 60 tablet 6  . azithromycin (ZITHROMAX) 250 MG tablet TAKE 2 TABLETS BY MOUTH TODAY, THEN TAKE 1 TABLET DAILY FOR 4 DAYS  0  . b complex vitamins  tablet Take 1 tablet by mouth daily.      . calcium carbonate (OS-CAL) 600 MG TABS Take 600 mg by mouth daily.      . Cholecalciferol 1000 UNITS capsule Take 2,000 Units by mouth daily.     . cloNIDine (CATAPRES) 0.1 MG tablet TAKE 1 TO 2 TABLETS BY MOUTH AT BEDTIME EVERY DAY  5  . Coenzyme Q10 (CO Q 10) 100 MG CAPS Take 1 capsule by mouth daily.    [name]  cyanocobalamin 1000 MCG tablet Take 1,000 mcg by mouth daily.    [name] EPINEPHrine (EPIPEN) 0.3 mg/0.3 mL SOAJ injection Inject 0.3 mg into the muscle daily as needed (allergic reaction).     . finasteride (PROSCAR) 5 MG tablet Take 5 mg by mouth daily.    . fluorouracil (EFUDEX) 5 % cream APPLY NN THE SKIN TWICE A DAY TO AFFECTED AREAS TWICE A DAY X2 WEEKS AS DIRECTED  0  . hydrochlorothiazide (MICROZIDE) 12.5 MG capsule Take 12.5 mg by mouth daily.    [name] losartan (COZAAR) 100 MG tablet     . Magnesium Hydroxide (MAGNESIA PO) Take 1,000 mg by mouth daily.     . Multiple Vitamin (MULTIVITAMIN) tablet Take 1 tablet by mouth daily.      . Omega-3 Fatty Acids (FISH OIL PO) Take 1,200 mg by mouth daily.    . potassium gluconate 595 (99 K) MG TABS tablet Take 595 mg by mouth.    . rosuvastatin (CRESTOR) 40 MG tablet Take 1 tablet (40 mg total) by mouth every evening. 90 tablet 3  . Tamsulosin HCl (FLOMAX) 0.4 MG CAPS Take 0.4 mg by mouth daily.      . vitamin C (ASCORBIC ACID) 500 MG tablet Take 2,000 mg by mouth daily.     No current facility-administered medications for this visit.     Family History  Problem Relation Age of Onset  . Heart disease Mother        AAA  Before age 2  . Cancer Mother 83       Colon cancer  . Hyperlipidemia Mother   . Heart failure Father     Social History   Socioeconomic History  . Marital status: Married    Spouse name: Not on file  . Number of children: Not on file  . Years of education: Not on file  . Highest education level: Not on file  Occupational History  . Not on file  Social Needs  . Financial  resource strain: Not on file  . Food insecurity:    Worry: Not on file    Inability: Not on file  . Transportation needs:    Medical: Not on file    Non-medical: Not on file  Tobacco Use  . Smoking status: Former Smoker    Years: 30.00    Types: Cigarettes    Last attempt to quit: 01/18/1978    Years since quitting: 39.7  . Smokeless tobacco: Former Systems developer    Quit date: 1980  Substance and Sexual Activity  . Alcohol use: Yes    Alcohol/week: 22.0 standard drinks    Types: 1 Shots of liquor, 21 Standard drinks or equivalent per week    Comment: 2 oz of vodka per day  . Drug use: No  . Sexual activity: Not on file  Lifestyle  . Physical activity:    Days per week: Not on file    Minutes per session: Not on file  . Stress: Not on file  Relationships  . Social connections:    Talks on phone: Not on file    Gets together: Not on file    Attends religious service: Not on file    Active member of club or organization: Not on file    Attends meetings of clubs or organizations: Not on file    Relationship status: Not on file  . Intimate partner violence:    Fear of current or ex partner: Not on file    Emotionally abused:  Not on file    Physically abused: Not on file    Forced sexual activity: Not on file  Other Topics Concern  . Not on file  Social History Narrative   Pt lives in [place] with spouse.  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     REVIEW OF SYSTEMS:   [X]  denotes positive finding, [ ]  denotes negative finding Cardiac  Comments:  Chest pain or chest pressure:    Shortness of breath upon exertion:    Short of breath when lying flat:    Irregular heart rhythm: x afib      Vascular    Pain in calf, thigh, or hip brought on by ambulation: x   Pain in feet at night that wakes you up from your sleep:     Blood clot in your veins:    Leg swelling:  x       Pulmonary    Oxygen at home:    Productive cough:     Wheezing:           Neurologic    Sudden weakness in arms or legs:     Sudden numbness in arms or legs:     Sudden onset of difficulty speaking or slurred speech:    Temporary loss of vision in one eye:     Problems with dizziness:         Gastrointestinal    Blood in stool:     Vomited blood:         Genitourinary    Burning when urinating:     Blood in urine:        Psychiatric    Major depression:         Hematologic    Bleeding problems:    Problems with blood clotting too easily:        Skin    Rashes or ulcers:        Constitutional    Fever or chills:      PHYSICAL EXAMINATION:  Vitals:   10/03/17 1522 10/03/17 1524  BP: (!) 172/70 (!) 173/70  Pulse: (!) 44 (!) 44  Resp:    Temp:    SpO2:     Vitals:   10/03/17 1516  Weight: 189 lb (85.7 kg)  Height: 6\' 1"  (1.854 m)   Body mass index is 24.94 kg/m.  General:  WDWN in NAD; vital signs documented above Gait: Normal HENT: WNL, normocephalic Pulmonary: normal non-labored breathing , without Rales, rhonchi,  wheezing Cardiac: regular HR, without  Murmurs without carotid bruits Abdomen: soft, NT, no masses Skin: without rashes; some ecchymosis on forearms due to Eliquis.   Vascular Exam/Pulses:  Right Left  Radial 2+ (normal) 2+ (normal)  Femoral 2+ (normal) 2+ (normal)  Popliteal Unable to palpate  Unable to palpate   DP Unable to palpate  Unable to palpate   PT Unable to palpate  Unable to palpate    Extremities: without ischemic changes, without Gangrene , without cellulitis; without open wounds;  1+ pitting edema at ankle bilaterally Musculoskeletal: no muscle wasting or atrophy  Neurologic: A&O X 3;  No focal weakness or paresthesias are detected Psychiatric:  The pt has Normal affect.   Non-Invasive Vascular Imaging:   ABI's/TBI's 10/03/17: Right:  0.60/0.36 Left:  0.82/0.35  Previous ABI's at another facility 2009: Right:  0.68 Left:  0.56  Lower extremity arterial duplex 10/03/17: Right  popliteal:  1.00cm (calcified) Left popliteal:  0.90cm (calcified) **incidental finding  of left popliteal vein measuring 1.60 x 1.64cm  Carotid duplex March 2019: Right ICA: 1-39% stenosis. Left ICA: CEA site, no restenosis Bilateral vertebral artery flow is antegrade.  Bilateral subclavian artery waveforms are normal. No significant change compared to the exam on 04-05-16.  Pt meds includes: Statin:  Yes.   Beta Blocker:  No. Aspirin:  No. ACEI:  No. ARB:  Yes.   CCB use:  Yes Other Antiplatelet/Anticoagulant:  Yes Eliquis   ASSESSMENT/PLAN:: 77 y.o. male with intermittent claudication   -pt with intermittent claudication-he can walk about a mile on a flat surface, but on incline, he can only walk about a quarter of a mile before having to rest.  He does not have any non healing wounds or rest pain.  His ABI's today reveal that the left has improved from 2009, but the right is essentially the same.   -duplex of his popliteal arteries are essentially unchanged.  He did have an incidental finding of the left popliteal vein measuring 1.60cm x 1.64cm.  I discussed this with Dr. Donnetta Hutching and he did not suggest any intervention for this.   -his carotid duplex in March reveal 1-39% stenosis on the right and no restenosis on the left-I discussed with him coming back in one year so that all his studies will be together.  He knows to call 911 or present to the ER should he develop stroke symptoms.      -I discussed with the pt that we will see him back in one year with repeat ABI's and duplex as well as carotid duplex.  He knows to call us sooner should he develop any non healing wounds or rest pain.    Leontine Locket, PA-C [facility] [phone]  Clinic MD:  [place]

## 2017-10-10 ENCOUNTER — Encounter (INDEPENDENT_AMBULATORY_CARE_PROVIDER_SITE_OTHER): Payer: PPO | Admitting: Ophthalmology

## 2017-10-10 DIAGNOSIS — H43813 Vitreous degeneration, bilateral: Secondary | ICD-10-CM | POA: Diagnosis not present

## 2017-10-10 DIAGNOSIS — H33302 Unspecified retinal break, left eye: Secondary | ICD-10-CM

## 2017-10-10 DIAGNOSIS — H338 Other retinal detachments: Secondary | ICD-10-CM | POA: Diagnosis not present

## 2017-10-10 DIAGNOSIS — H35033 Hypertensive retinopathy, bilateral: Secondary | ICD-10-CM | POA: Diagnosis not present

## 2017-10-10 DIAGNOSIS — I10 Essential (primary) hypertension: Secondary | ICD-10-CM

## 2017-10-12 DIAGNOSIS — I2510 Atherosclerotic heart disease of native coronary artery without angina pectoris: Secondary | ICD-10-CM | POA: Diagnosis not present

## 2017-10-12 DIAGNOSIS — I10 Essential (primary) hypertension: Secondary | ICD-10-CM | POA: Diagnosis not present

## 2017-10-12 DIAGNOSIS — I4891 Unspecified atrial fibrillation: Secondary | ICD-10-CM | POA: Diagnosis not present

## 2017-10-12 DIAGNOSIS — I701 Atherosclerosis of renal artery: Secondary | ICD-10-CM | POA: Diagnosis not present

## 2017-10-12 DIAGNOSIS — E7800 Pure hypercholesterolemia, unspecified: Secondary | ICD-10-CM | POA: Diagnosis not present

## 2017-10-12 DIAGNOSIS — N400 Enlarged prostate without lower urinary tract symptoms: Secondary | ICD-10-CM | POA: Diagnosis not present

## 2017-10-12 DIAGNOSIS — M159 Polyosteoarthritis, unspecified: Secondary | ICD-10-CM | POA: Diagnosis not present

## 2017-10-12 DIAGNOSIS — I63512 Cerebral infarction due to unspecified occlusion or stenosis of left middle cerebral artery: Secondary | ICD-10-CM | POA: Diagnosis not present

## 2017-11-07 DIAGNOSIS — J209 Acute bronchitis, unspecified: Secondary | ICD-10-CM | POA: Diagnosis not present

## 2017-11-07 DIAGNOSIS — M170 Bilateral primary osteoarthritis of knee: Secondary | ICD-10-CM | POA: Diagnosis not present

## 2017-11-07 DIAGNOSIS — I10 Essential (primary) hypertension: Secondary | ICD-10-CM | POA: Diagnosis not present

## 2017-11-07 DIAGNOSIS — I63512 Cerebral infarction due to unspecified occlusion or stenosis of left middle cerebral artery: Secondary | ICD-10-CM | POA: Diagnosis not present

## 2017-11-07 DIAGNOSIS — N400 Enlarged prostate without lower urinary tract symptoms: Secondary | ICD-10-CM | POA: Diagnosis not present

## 2017-11-07 DIAGNOSIS — I701 Atherosclerosis of renal artery: Secondary | ICD-10-CM | POA: Diagnosis not present

## 2017-11-07 DIAGNOSIS — I119 Hypertensive heart disease without heart failure: Secondary | ICD-10-CM | POA: Diagnosis not present

## 2017-11-07 DIAGNOSIS — I4891 Unspecified atrial fibrillation: Secondary | ICD-10-CM | POA: Diagnosis not present

## 2017-11-07 DIAGNOSIS — E785 Hyperlipidemia, unspecified: Secondary | ICD-10-CM | POA: Diagnosis not present

## 2017-11-07 DIAGNOSIS — E7800 Pure hypercholesterolemia, unspecified: Secondary | ICD-10-CM | POA: Diagnosis not present

## 2017-11-07 DIAGNOSIS — I2510 Atherosclerotic heart disease of native coronary artery without angina pectoris: Secondary | ICD-10-CM | POA: Diagnosis not present

## 2017-11-07 NOTE — Progress Notes (Signed)
Cardiology Office Note:    Date:  11/08/2017   ID:  [name], DOB 1940/06/20, MRN [id number]  PCP:  Josetta Huddle, MD  Cardiologist:  Fransico Him, MD    Referring MD: Josetta Huddle, MD   Chief Complaint  Patient presents with  . Coronary Artery Disease  . Hypertension  . Hyperlipidemia  . Atrial Fibrillation    History of Present Illness:    [name] is a 77 y.o. male with a hx of CAD (s/p CABG with LIMA to LAD, SVG to RCA and SVG to LCx 12/2015), HTN, dyslipidemia,PAF on chronic anticoagulation with Eliquis, carotid artery stenosis s/p L CEA, AAA s/p repair and followed by Dr. Donnetta Hutching. He had post op afib and was placed on Amio post op from CABG.  He is here today for followup and is doing well.  He denies any chest pain or pressure, SOB, DOE, PND, orthopnea, LE edema, dizziness, palpitations or syncope. He is compliant with his meds and is tolerating meds with no SE.    Past Medical History:  Diagnosis Date  . Arthritis    HANDS AND FEET  . Asymptomatic carotid artery stenosis BILATERAL   PER DOPPLER  03/2016  RICA  1 - 39%/  s/p  L CEA  1-39%- followed by Dr. Donnetta Hutching  . CAD (coronary artery disease)    s/p remote stenting, s/p CABG with LIMA to LAD, SVG to RCA and SVG to LCx 12/2015  . Cataract immature BILATERAL   . Diverticulosis   . 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 History of diverticulosis    Noted on Colonoscopy  . History of inguinal herniorrhaphy   . Hydrocele, left   . Hyperlipidemia   . Hypertension   . Increased intraocular pressure   . Nocturia   . Paroxysmal atrial fibrillation ([place]) 06/30/14   chad2vasc score is at least 4  . 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 Retinal detachment   . Stroke [facility])     Past Surgical History:  Procedure Laterality Date  . AAA REPAIR W/ AORTOBIFEMORAL BYPASS  OCT  02637  . ABDOMINAL HERNIA REPAIR  X4  (LAST ONE 1990'S)   inguinal herniorrhaphy    . CARDIAC CATHETERIZATION N/A 12/16/2015   Procedure: Left Heart Cath and Coronary Angiography;  Surgeon: Wellington Hampshire, MD;  Location: [place] [facility];  Service: Cardiovascular;  Laterality: N/A;  . CAROTID ENDARTERECTOMY    . CARPAL TUNNEL RELEASE Right Oct. 2013   Hand  . CORONARY ANGIOPLASTY  2002   PTCA  W/ X2 BM STENTS--   PROXIMA LAD  &  LEFT CIRCUMFLEX TO FIRST OBTUSE MARGINAL BRANCH  . CORONARY ARTERY BYPASS GRAFT N/A 12/18/2015   Procedure: CORONARY ARTERY BYPASS GRAFTING (CABG) x 3, using left internal mammary artery and bilateral   leg greater saphenous vein harvested endoscopically  LIMA to LAD, SVG to Circumflex, SVG to RCA;  Surgeon: Grace Isaac, MD;  Location: [place];  Service: Open Heart Surgery;  Laterality: N/A;  . ENDARTERECTOMY Left 03/11/2015   Procedure: ENDARTERECTOMY LEFT CAROTID;  Surgeon: Rosetta Posner, MD;  Location: [place];  Service: Vascular;  Laterality: Left;  . EXPLORATORY LAPAROTOMY W/ ENTEROLYSIS FOR PARTIAL SBO  09-23-2008  . heart stents  02/19/00  . HYDROCELE EXCISION  01/24/2011   Procedure: HYDROCELECTOMY ADULT;  Surgeon: Bernestine Amass, MD;  [facility];  Service: Urology;  Laterality: Left;  .  Ingrown Toe Nail Right Jan. 2015   Right Great Toe nail  . intest blockage  09/22/08  . PATCH ANGIOPLASTY Left 03/11/2015   Procedure: PATCH ANGIOPLASTY LEFT CAROTID USING HEMASHIELD PLATINUM FINESSE PATCH;  Surgeon: Rosetta Posner, MD;  Location: [place];  Service: Vascular;  Laterality: Left;  . PULLEY RELEASE -- RIGHT 5TH FINGER  2009  . RETINAL DETACHMENT SURGERY  2004   BILATERAL  . TEE WITHOUT CARDIOVERSION N/A 12/18/2015   Procedure: TRANSESOPHAGEAL ECHOCARDIOGRAM (TEE);  Surgeon: Grace Isaac, MD;  Location: [place];  Service: Open Heart Surgery;  Laterality: N/A;  . TOOTH EXTRACTION  Dec 2013    Current Medications: Current Meds  Medication Sig  . acetaminophen (TYLENOL) 500 MG tablet Take 2 tablets (1,000 mg total) by  mouth every 6 (six) hours as needed.  [name] amiodarone (PACERONE) 200 MG tablet Take 1 tablet (200 mg total) by mouth daily.  [name] amLODipine (NORVASC) 5 MG tablet Take 1 tablet (5 mg total) daily by mouth.  [name] apixaban (ELIQUIS) 5 MG TABS tablet Take 1 tablet (5 mg total) by mouth 2 (two) times daily.  [name] azithromycin (ZITHROMAX) 250 MG tablet TAKE 2 TABLETS BY MOUTH TODAY, THEN TAKE 1 TABLET DAILY FOR 4 DAYS  . b complex vitamins tablet Take 1 tablet by mouth daily.    . calcium carbonate (OS-CAL) 600 MG TABS Take 600 mg by mouth daily.    . Cholecalciferol 1000 UNITS capsule Take 1,000 Units by mouth daily.   . cloNIDine (CATAPRES) 0.1 MG tablet TAKE 1 TO 2 TABLETS BY MOUTH AT BEDTIME EVERY DAY  . Coenzyme Q10 (CO Q 10) 100 MG CAPS Take 1 capsule by mouth daily.  . cyanocobalamin 1000 MCG tablet Take 1,000 mcg by mouth daily.  [name] EPINEPHrine (EPIPEN) 0.3 mg/0.3 mL SOAJ injection Inject 0.3 mg into the muscle daily as needed (allergic reaction).   . finasteride (PROSCAR) 5 MG tablet Take 5 mg by mouth daily.  . fluorouracil (EFUDEX) 5 % cream APPLY NN THE SKIN TWICE A DAY TO AFFECTED AREAS TWICE A DAY X2 WEEKS AS DIRECTED  . hydrochlorothiazide (MICROZIDE) 12.5 MG capsule Take 12.5 mg by mouth daily.  [name] losartan (COZAAR) 100 MG tablet   . Magnesium Hydroxide (MAGNESIA PO) Take 1,000 mg by mouth daily.   . Multiple Vitamin (MULTIVITAMIN) tablet Take 1 tablet by mouth daily.    . Omega-3 Fatty Acids (FISH OIL PO) Take 1,200 mg by mouth daily.  . potassium gluconate 595 (99 K) MG TABS tablet Take 595 mg by mouth.  . rosuvastatin (CRESTOR) 40 MG tablet Take 1 tablet (40 mg total) by mouth every evening.  . Tamsulosin HCl (FLOMAX) 0.4 MG CAPS Take 0.8 mg by mouth daily.   . vitamin C (ASCORBIC ACID) 500 MG tablet Take 2,000 mg by mouth daily.     Allergies:   Wasp venom and Other   Social History   Socioeconomic History  . Marital status: Married    Spouse name: Not on file  . Number of children:  Not on file  . Years of education: Not on file  . Highest education level: Not on file  Occupational History  . Not on file  Social Needs  . Financial resource strain: Not on file  . Food insecurity:    Worry: Not on file    Inability: Not on file  . Transportation needs:    Medical: Not on file    Non-medical: Not on file  Tobacco  Use  . Smoking status: Former Smoker    Years: 30.00    Types: Cigarettes    Last attempt to quit: 01/18/1978    Years since quitting: 39.8  . Smokeless tobacco: Never Used  Substance and Sexual Activity  . Alcohol use: Yes    Alcohol/week: 22.0 standard drinks    Types: 1 Shots of liquor, 21 Standard drinks or equivalent per week    Comment: 2 oz of vodka per day  . Drug use: No  . Sexual activity: Not on file  Lifestyle  . Physical activity:    Days per week: Not on file    Minutes per session: Not on file  . Stress: Not on file  Relationships  . Social connections:    Talks on phone: Not on file    Gets together: Not on file    Attends religious service: Not on file    Active member of club or organization: Not on file    Attends meetings of clubs or organizations: Not on file    Relationship status: Not on file  Other Topics Concern  . Not on file  Social History Narrative   Pt lives in [place] with spouse.  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     Family History: The patient's family history includes Cancer (age of onset: 33) in his mother; Heart disease in his mother; Heart failure in his father; Hyperlipidemia in his mother.  ROS:   Please see the history of present illness.    ROS  All other systems reviewed and negative.   EKGs/Labs/Other Studies Reviewed:    The following studies were reviewed today: none  EKG:  EKG is not  ordered today.  Recent Labs: 11/08/2016: BUN 24; Creatinine, Ser 1.33; Potassium 4.4; Sodium 137   Recent Lipid Panel    Component Value Date/Time   CHOL 162 07/26/2016  0754   CHOL 164 05/24/2013 0916   TRIG 35 07/26/2016 0754   TRIG 50 05/24/2013 0916   HDL 87 07/26/2016 0754   HDL 84 05/24/2013 0916   CHOLHDL 1.9 07/26/2016 0754   CHOLHDL 2.4 12/16/2015 0218   VLDL 15 12/16/2015 0218   LDLCALC 68 07/26/2016 0754   LDLCALC 70 05/24/2013 0916    Physical Exam:    VS:  Ht 6\' 1"  (1.854 m)   BMI 24.94 kg/m     Wt Readings from Last 3 Encounters:  10/03/17 189 lb (85.7 kg)  08/21/17 188 lb (85.3 kg)  04/11/17 193 lb (87.5 kg)     GEN:  Well nourished, well developed in no acute distress HEENT: Normal NECK: No JVD; No carotid bruits LYMPHATICS: No lymphadenopathy CARDIAC: RRR, no murmurs, rubs, gallops RESPIRATORY:  Clear to auscultation without rales, wheezing or rhonchi  ABDOMEN: Soft, non-tender, non-distended MUSCULOSKELETAL:  No edema; No deformity  SKIN: Warm and dry NEUROLOGIC:  Alert and oriented x 3 PSYCHIATRIC:  Normal affect   ASSESSMENT:    1. Atherosclerosis of native coronary artery of native heart without angina pectoris   2. Persistent atrial fibrillation   3. Essential hypertension   4. Pure hypercholesterolemia   5. Stenosis of right carotid artery    PLAN:    In order of problems listed above:  1.  ASCAD - s/p CABG with LIMA to LAD, SVG to RCA and SVG to LCx 12/2015.  He denies any anginal symptoms.  He will continue on statin.  He is not on aspirin due to DOAC.  2.  Persistent atrial fibrillation -he has maintain normal sinus rhythm on exam today.  He will continue on apixaban 5 mg twice daily.  I am going to decrease his amiodarone to 100 mg daily because he has been bradycardic.  He brought in readings from home and most of his heart rates are in the 40s.  I will check an EKG in 1 week.  I will check a bmet and hemoglobin today.  His TSH is 1.27 on 06/07/2017  and ALT was normal at 25.  He is due to get pulmonary function test with DLCO.  These will be ordered today.  3.  HTN -BP is controlled on exam today.  He  will continue on amlodipine 5 mg daily, clonidine 0.1 mg nightly, HCTZ 12.5 mg daily, losartan 100 mg daily.  His creatinine was 1.49 on 06/07/2017.  I will repeat a bmet.  4.  Hyperlipidemia -LDL goal is less than 70.  His last LDL was 82 on 06/07/2017.  I will repeat an FLP and ALP to see if this is changed.  He will continue on Crestor 40 mg daily.  5.  Right carotid artery stenosis - Dopplers 04/11/2016 showed 1 to 39% right carotid artery stenosis and no evidence of stenosis of the left ICA.  He will continue on statin therapy.   Medication Adjustments/Labs and Tests Ordered: Current medicines are reviewed at length with the patient today.  Concerns regarding medicines are outlined above.  No orders of the defined types were placed in this encounter.  No orders of the defined types were placed in this encounter.   Signed, Fransico Him, MD  11/08/2017 9:17 AM    [place]

## 2017-11-08 ENCOUNTER — Ambulatory Visit: Payer: PPO | Admitting: Cardiology

## 2017-11-08 ENCOUNTER — Encounter: Payer: Self-pay | Admitting: Cardiology

## 2017-11-08 VITALS — BP 130/60 | HR 72 | Ht 73.0 in | Wt 194.8 lb

## 2017-11-08 DIAGNOSIS — E7800 Pure hypercholesterolemia, unspecified: Secondary | ICD-10-CM

## 2017-11-08 DIAGNOSIS — I10 Essential (primary) hypertension: Secondary | ICD-10-CM | POA: Diagnosis not present

## 2017-11-08 DIAGNOSIS — I4819 Other persistent atrial fibrillation: Secondary | ICD-10-CM

## 2017-11-08 DIAGNOSIS — I2510 Atherosclerotic heart disease of native coronary artery without angina pectoris: Secondary | ICD-10-CM

## 2017-11-08 DIAGNOSIS — I6521 Occlusion and stenosis of right carotid artery: Secondary | ICD-10-CM

## 2017-11-08 LAB — BASIC METABOLIC PANEL
BUN/Creatinine Ratio: 12 (ref 10–24)
BUN: 18 mg/dL (ref 8–27)
CO2: 23 mmol/L (ref 20–29)
CREATININE: 1.5 mg/dL — AB (ref 0.76–1.27)
Calcium: 9.4 mg/dL (ref 8.6–10.2)
Chloride: 95 mmol/L — ABNORMAL LOW (ref 96–106)
GFR calc Af Amer: 51 mL/min/{1.73_m2} — ABNORMAL LOW (ref >59)
GFR, EST NON AFRICAN AMERICAN: 44 mL/min/{1.73_m2} — AB (ref >59)
Glucose: 92 mg/dL (ref 65–99)
Potassium: 4.4 mmol/L (ref 3.5–5.2)
SODIUM: 134 mmol/L (ref 134–144)

## 2017-11-08 LAB — LIPID PANEL
CHOL/HDL RATIO: 1.7 ratio (ref 0.0–5.0)
Cholesterol, Total: 155 mg/dL (ref 100–199)
HDL: 90 mg/dL (ref >39)
LDL CALC: 60 mg/dL (ref 0–99)
TRIGLYCERIDES: 27 mg/dL (ref 0–149)
VLDL Cholesterol Cal: 5 mg/dL (ref 5–40)

## 2017-11-08 LAB — HEPATIC FUNCTION PANEL
ALBUMIN: 4.3 g/dL (ref 3.5–4.8)
ALK PHOS: 44 IU/L (ref 39–117)
ALT: 26 IU/L (ref 0–44)
AST: 30 IU/L (ref 0–40)
BILIRUBIN TOTAL: 0.6 mg/dL (ref 0.0–1.2)
BILIRUBIN, DIRECT: 0.21 mg/dL (ref 0.00–0.40)
Total Protein: 6.7 g/dL (ref 6.0–8.5)

## 2017-11-08 MED ORDER — AMIODARONE HCL 100 MG PO TABS: 100.0000 mg | ORAL_TABLET | Freq: Every day | ORAL | 3 refills | Status: DC

## 2017-11-08 NOTE — Patient Instructions (Signed)
Medication Instructions:   Decrease Amiodarone to 100 mg, daily, by mouth  If you need a refill on your cardiac medications before your next appointment, please call your pharmacy.   Lab work: Today: BMET, LFT and Lipids  If you have labs (blood work) drawn today and your tests are completely normal, you will receive your results only by: [name] MyChart Message (if you have MyChart) OR . A paper copy in the mail If you have any lab test that is abnormal or we need to change your treatment, we will call you to review the results.  Testing/Procedures: Your physician has recommended that you have a pulmonary function test. Pulmonary Function Tests are a group of tests that measure how well air moves in and out of your lungs.  Follow-Up: At [facility], you and your health needs are our priority.  As part of our continuing mission to provide you with exceptional heart care, we have created designated Provider Care Teams.  These Care Teams include your primary Cardiologist (physician) and Advanced Practice Providers (APPs -  Physician Assistants and Nurse Practitioners) who all work together to provide you with the care you need, when you need it. You will need a follow up appointment in 1 years.  Please call our office 2 months in advance to schedule this appointment.  You may see Fransico Him, MD or one of the following Advanced Practice Providers on your designated Care Team:   [place], PA-C Melina Copa, PA-C . Ermalinda Barrios, PA-C

## 2017-11-10 ENCOUNTER — Telehealth: Payer: Self-pay

## 2017-11-10 DIAGNOSIS — I4819 Other persistent atrial fibrillation: Secondary | ICD-10-CM

## 2017-11-10 DIAGNOSIS — I10 Essential (primary) hypertension: Secondary | ICD-10-CM

## 2017-11-10 NOTE — Telephone Encounter (Signed)
Notes recorded by Sueanne Margarita, MD on 11/08/2017 at 8:32 PM EDT Creatinine as bumped - please stop HCTZ and check BP daily for a week and call with results. Please repeat BMet in 1 week. Lipids at goal

## 2017-11-10 NOTE — Telephone Encounter (Signed)
Spoke with the patient, he accepted to stop HCTZ and is recording blood pressure. He is scheduled for nurse visit on 10/30 for amiodarone dose change, he will bring info then and have BMET.

## 2017-11-15 ENCOUNTER — Other Ambulatory Visit: Payer: PPO | Admitting: *Deleted

## 2017-11-15 ENCOUNTER — Ambulatory Visit (INDEPENDENT_AMBULATORY_CARE_PROVIDER_SITE_OTHER): Payer: PPO

## 2017-11-15 VITALS — BP 138/62 | HR 45 | Ht 73.0 in | Wt 194.1 lb

## 2017-11-15 DIAGNOSIS — I4819 Other persistent atrial fibrillation: Secondary | ICD-10-CM

## 2017-11-15 DIAGNOSIS — I10 Essential (primary) hypertension: Secondary | ICD-10-CM

## 2017-11-15 DIAGNOSIS — R001 Bradycardia, unspecified: Secondary | ICD-10-CM

## 2017-11-15 LAB — BASIC METABOLIC PANEL
BUN/Creatinine Ratio: 15 (ref 10–24)
BUN: 22 mg/dL (ref 8–27)
CALCIUM: 9.4 mg/dL (ref 8.6–10.2)
CO2: 23 mmol/L (ref 20–29)
Chloride: 96 mmol/L (ref 96–106)
Creatinine, Ser: 1.48 mg/dL — ABNORMAL HIGH (ref 0.76–1.27)
GFR calc Af Amer: 52 mL/min/{1.73_m2} — ABNORMAL LOW (ref >59)
GFR, EST NON AFRICAN AMERICAN: 45 mL/min/{1.73_m2} — AB (ref >59)
Glucose: 93 mg/dL (ref 65–99)
POTASSIUM: 4.4 mmol/L (ref 3.5–5.2)
Sodium: 133 mmol/L — ABNORMAL LOW (ref 134–144)

## 2017-11-15 NOTE — Patient Instructions (Signed)
Medication Instructions:  Your physician recommends that you continue on your current medications as directed. Please refer to the Current Medication list given to you today.  Labwork: None  Testing/Procedures: Your physician has recommended that you wear a holter monitor. Holter monitors are medical devices that record the heart's electrical activity. Doctors most often use these monitors to diagnose arrhythmias. Arrhythmias are problems with the speed or rhythm of the heartbeat. The monitor is a small, portable device. You can wear one while you do your normal daily activities. This is usually used to diagnose what is causing palpitations/syncope (passing out).  Follow-Up: As directed previously  Any Other Special Instructions Will Be Listed Below (If Applicable).     If you need a refill on your cardiac medications before your next appointment, please call your pharmacy.   [place], RN, BSN

## 2017-11-15 NOTE — Progress Notes (Signed)
Patient presented for EKG today due to decrease in amio from last office visit. Per the patient he has continued to be bradycardic but has not had any symptoms. EKG today reflected bradycardia. Per Dr. Radford Pax patient is to wear 24 hour monitor to assess arrhythmia.

## 2017-11-21 ENCOUNTER — Telehealth: Payer: Self-pay

## 2017-11-21 DIAGNOSIS — Z79899 Other long term (current) drug therapy: Secondary | ICD-10-CM

## 2017-11-21 DIAGNOSIS — I10 Essential (primary) hypertension: Secondary | ICD-10-CM

## 2017-11-21 MED ORDER — AMLODIPINE BESYLATE 10 MG PO TABS: 10.0000 mg | ORAL_TABLET | Freq: Every day | ORAL | 0 refills | Status: DC

## 2017-11-21 MED ORDER — LOSARTAN POTASSIUM 50 MG PO TABS: 50.0000 mg | ORAL_TABLET | Freq: Every day | ORAL | 3 refills | Status: AC

## 2017-11-21 NOTE — Telephone Encounter (Signed)
Notes recorded by Sueanne Margarita, MD on 11/20/2017 at 5:34 PM EST Please decrease losartan to 50mg  daily and increase amlodipine to 10mg  daily due to worsening renal function and need for BP control. Check BMET in 1 week

## 2017-11-21 NOTE — Telephone Encounter (Signed)
Spoke with the patient he accepted the medication changes. He is schedule for BMET on 11/12.

## 2017-11-27 DIAGNOSIS — E7800 Pure hypercholesterolemia, unspecified: Secondary | ICD-10-CM | POA: Diagnosis not present

## 2017-11-27 DIAGNOSIS — I63512 Cerebral infarction due to unspecified occlusion or stenosis of left middle cerebral artery: Secondary | ICD-10-CM | POA: Diagnosis not present

## 2017-11-27 DIAGNOSIS — J40 Bronchitis, not specified as acute or chronic: Secondary | ICD-10-CM | POA: Diagnosis not present

## 2017-11-27 DIAGNOSIS — M170 Bilateral primary osteoarthritis of knee: Secondary | ICD-10-CM | POA: Diagnosis not present

## 2017-11-27 DIAGNOSIS — E785 Hyperlipidemia, unspecified: Secondary | ICD-10-CM | POA: Diagnosis not present

## 2017-11-27 DIAGNOSIS — I701 Atherosclerosis of renal artery: Secondary | ICD-10-CM | POA: Diagnosis not present

## 2017-11-27 DIAGNOSIS — I10 Essential (primary) hypertension: Secondary | ICD-10-CM | POA: Diagnosis not present

## 2017-11-27 DIAGNOSIS — N400 Enlarged prostate without lower urinary tract symptoms: Secondary | ICD-10-CM | POA: Diagnosis not present

## 2017-11-27 DIAGNOSIS — I119 Hypertensive heart disease without heart failure: Secondary | ICD-10-CM | POA: Diagnosis not present

## 2017-11-27 DIAGNOSIS — I4891 Unspecified atrial fibrillation: Secondary | ICD-10-CM | POA: Diagnosis not present

## 2017-11-27 DIAGNOSIS — I2510 Atherosclerotic heart disease of native coronary artery without angina pectoris: Secondary | ICD-10-CM | POA: Diagnosis not present

## 2017-11-28 ENCOUNTER — Other Ambulatory Visit: Payer: PPO

## 2017-11-28 ENCOUNTER — Ambulatory Visit (INDEPENDENT_AMBULATORY_CARE_PROVIDER_SITE_OTHER): Payer: PPO

## 2017-11-28 DIAGNOSIS — Z79899 Other long term (current) drug therapy: Secondary | ICD-10-CM

## 2017-11-28 DIAGNOSIS — I10 Essential (primary) hypertension: Secondary | ICD-10-CM

## 2017-11-28 DIAGNOSIS — R001 Bradycardia, unspecified: Secondary | ICD-10-CM | POA: Diagnosis not present

## 2017-11-28 LAB — BASIC METABOLIC PANEL
BUN/Creatinine Ratio: 15 (ref 10–24)
BUN: 23 mg/dL (ref 8–27)
CALCIUM: 9.1 mg/dL (ref 8.6–10.2)
CO2: 21 mmol/L (ref 20–29)
Chloride: 97 mmol/L (ref 96–106)
Creatinine, Ser: 1.53 mg/dL — ABNORMAL HIGH (ref 0.76–1.27)
GFR calc Af Amer: 50 mL/min/{1.73_m2} — ABNORMAL LOW (ref >59)
GFR, EST NON AFRICAN AMERICAN: 43 mL/min/{1.73_m2} — AB (ref >59)
GLUCOSE: 76 mg/dL (ref 65–99)
Potassium: 4.6 mmol/L (ref 3.5–5.2)
Sodium: 132 mmol/L — ABNORMAL LOW (ref 134–144)

## 2017-12-06 ENCOUNTER — Ambulatory Visit (INDEPENDENT_AMBULATORY_CARE_PROVIDER_SITE_OTHER): Payer: PPO | Admitting: Internal Medicine

## 2017-12-06 DIAGNOSIS — I4819 Other persistent atrial fibrillation: Secondary | ICD-10-CM

## 2017-12-06 LAB — PULMONARY FUNCTION TEST
DL/VA % PRED: 91 %
DL/VA: 4.36 ml/min/mmHg/L
DLCO UNC % PRED: 68 %
DLCO UNC: 24.85 ml/min/mmHg
FEF 25-75 POST: 1.56 L/s
FEF 25-75 Pre: 1.31 L/sec
FEF2575-%Change-Post: 18 %
FEF2575-%Pred-Post: 66 %
FEF2575-%Pred-Pre: 55 %
FEV1-%CHANGE-POST: 4 %
FEV1-%Pred-Post: 71 %
FEV1-%Pred-Pre: 68 %
FEV1-POST: 2.39 L
FEV1-Pre: 2.29 L
FEV1FVC-%CHANGE-POST: 7 %
FEV1FVC-%PRED-PRE: 95 %
FEV6-%Change-Post: -1 %
FEV6-%Pred-Post: 73 %
FEV6-%Pred-Pre: 75 %
FEV6-Post: 3.22 L
FEV6-Pre: 3.27 L
FEV6FVC-%CHANGE-POST: 1 %
FEV6FVC-%Pred-Post: 105 %
FEV6FVC-%Pred-Pre: 104 %
FVC-%Change-Post: -2 %
FVC-%PRED-PRE: 71 %
FVC-%Pred-Post: 69 %
FVC-PRE: 3.32 L
FVC-Post: 3.23 L
POST FEV1/FVC RATIO: 74 %
PRE FEV6/FVC RATIO: 99 %
Post FEV6/FVC ratio: 100 %
Pre FEV1/FVC ratio: 69 %
RV % pred: 101 %
RV: 2.83 L
TLC % PRED: 80 %
TLC: 6.14 L

## 2017-12-06 NOTE — Progress Notes (Signed)
PFT completed today.  

## 2017-12-07 ENCOUNTER — Telehealth: Payer: Self-pay

## 2017-12-07 DIAGNOSIS — I4819 Other persistent atrial fibrillation: Secondary | ICD-10-CM

## 2017-12-07 NOTE — Telephone Encounter (Signed)
-----   Message from Sueanne Margarita, MD sent at 12/06/2017 11:13 PM EST ----- Stable PFTs with mild obstructive airway disease and mildly decreased DLCO - repeat study in 1 year due to [facility]

## 2017-12-07 NOTE — Telephone Encounter (Signed)
Spoke with the patient he expressed understanding about his PFT results. Placed an order for a repeat PFT in 1 year.

## 2017-12-14 ENCOUNTER — Other Ambulatory Visit: Payer: Self-pay | Admitting: Cardiology

## 2017-12-14 IMAGING — CT CT HEAD W/O CM
3 series · 16 of 30 positions shown, 19 images · non-contrast
Comparison: None.

CLINICAL DATA: Code stroke for right-sided weakness and resolving
slurred speech.

EXAM:
CT HEAD WITHOUT CONTRAST
TECHNIQUE: Contiguous axial images were obtained from the base of the skull
through the vertex without intravenous contrast.

[Series 2: head without · axial · non-contrast · 0.46mm/px · z∈[-150,-70]mm · 3 of 34 slices shown]
[im 9/34  brain]
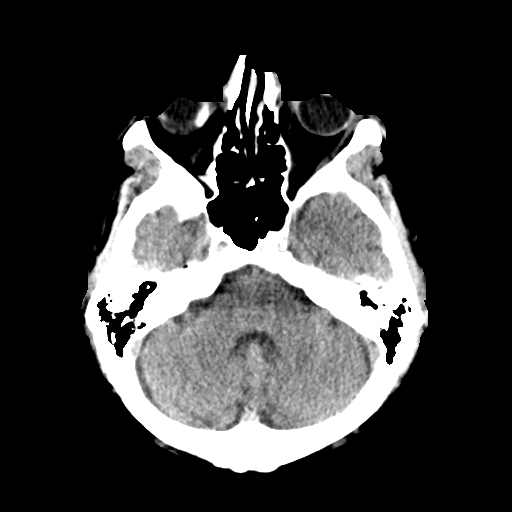
[im 17/34  brain]
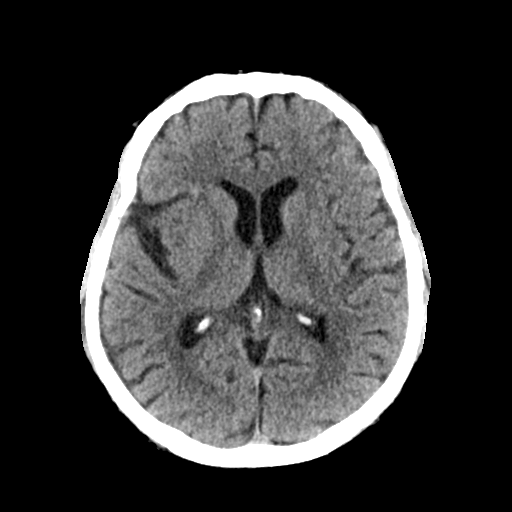
[im 25/34  brain]
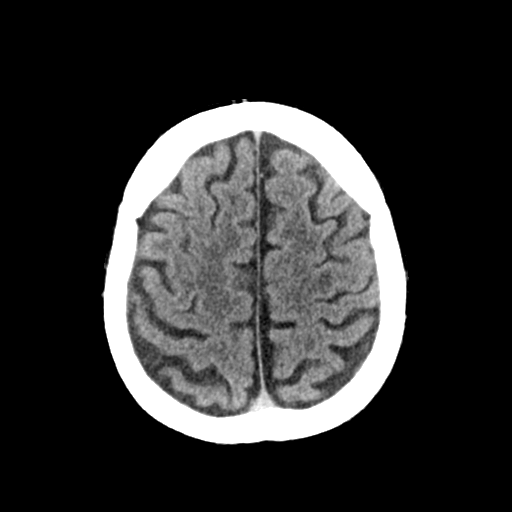

[Series 3: head bone · axial · 0.46mm/px · z∈[-178,-136]mm · 3 of 84 slices shown]
[im 7/84  bone]
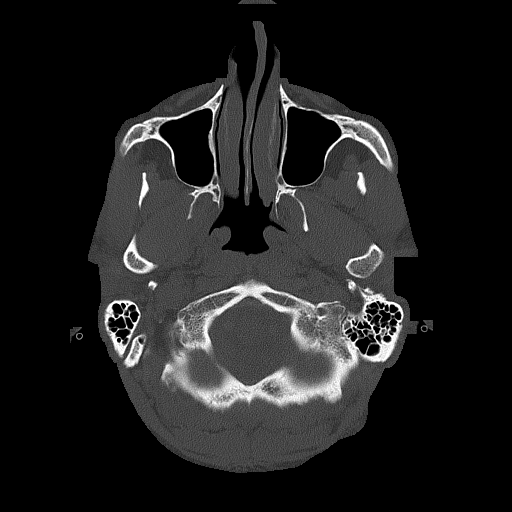
[im 21/84  bone]
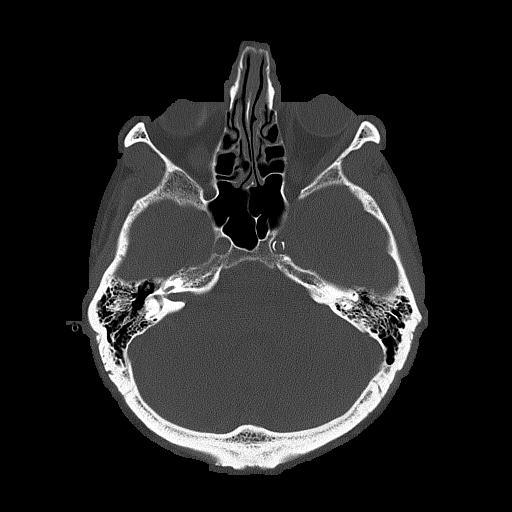
[im 28/84  bone]
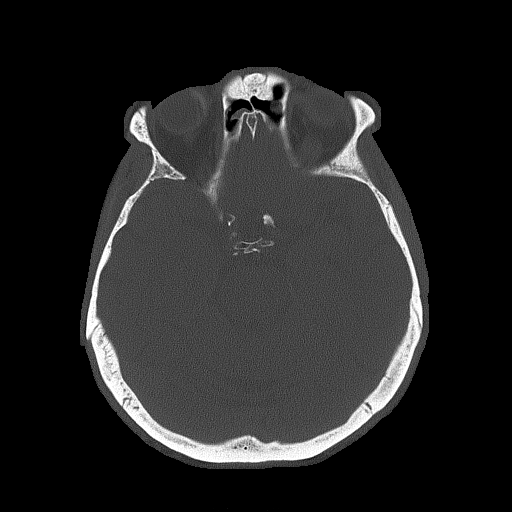

[Series 4: head without ax · axial · non-contrast · 0.46mm/px · z∈[-119,+2]mm · 10 of 81 slices shown, 13 images]
[im 8/81  brain]
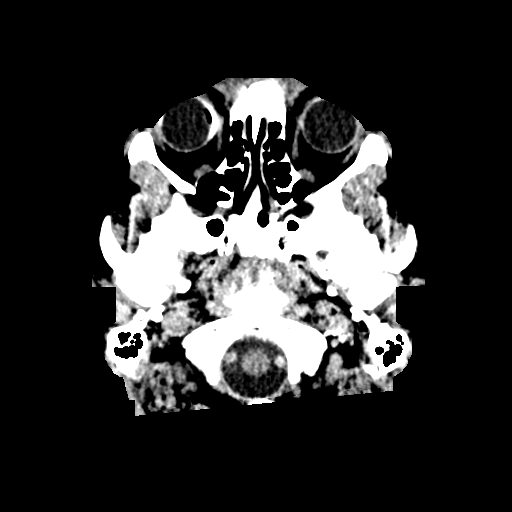
[im 8/81  bone]
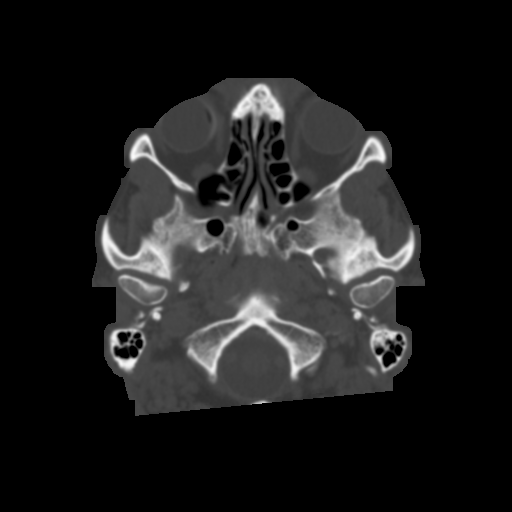
[im 15/81  brain]
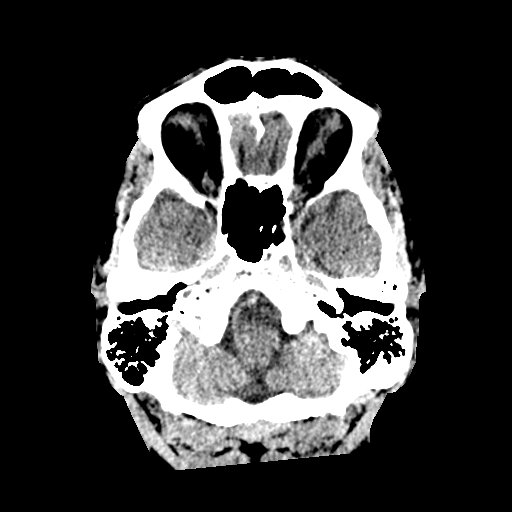
[im 22/81  brain]
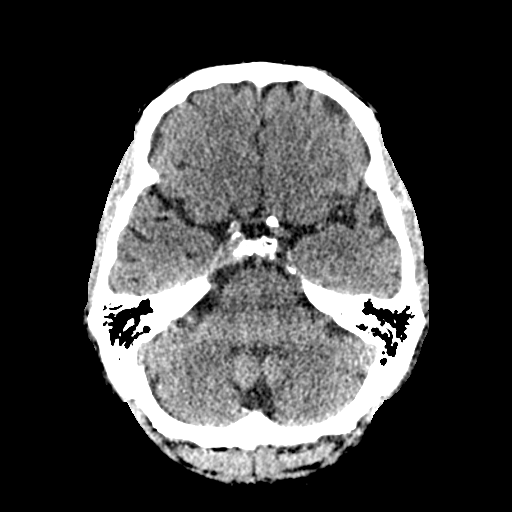
[im 30/81  brain]
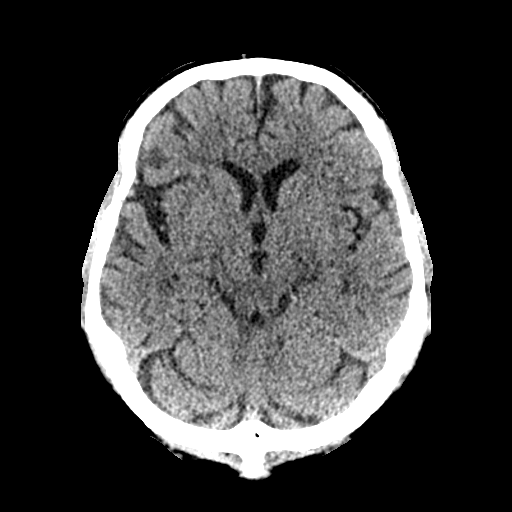
[im 37/81  brain]
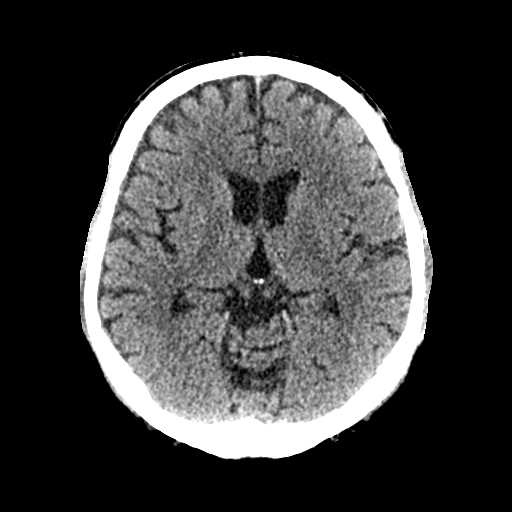
[im 37/81  bone]
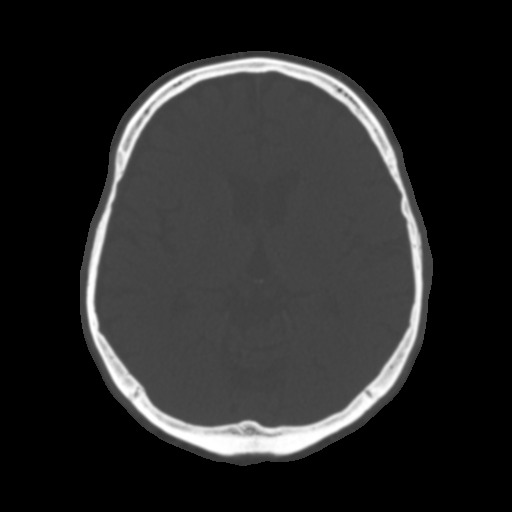
[im 44/81  brain]
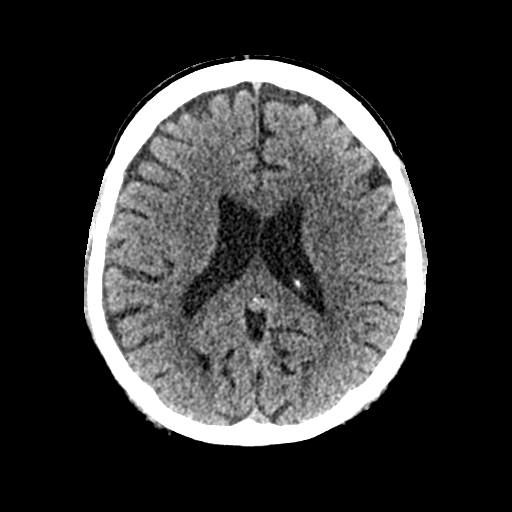
[im 51/81  brain]
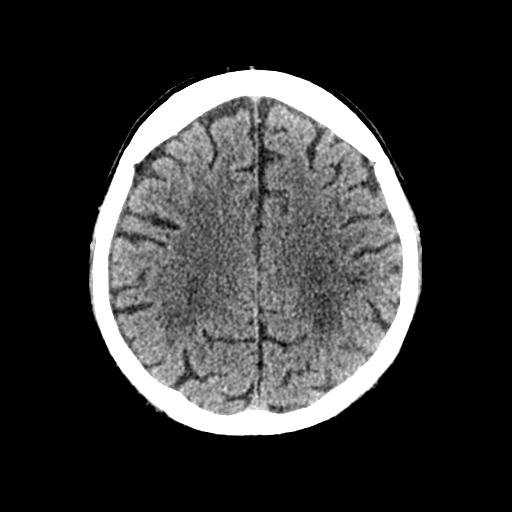
[im 59/81  brain]
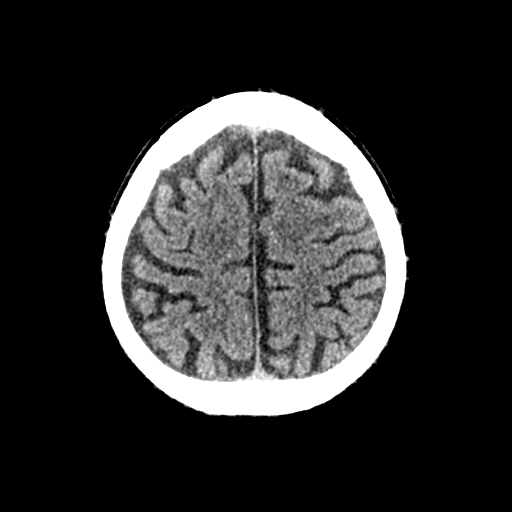
[im 66/81  brain]
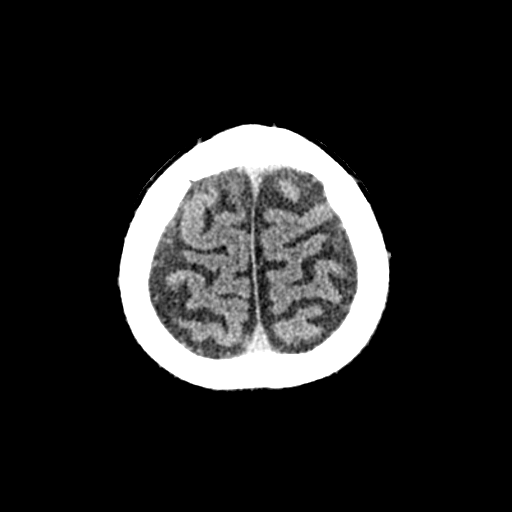
[im 66/81  bone]
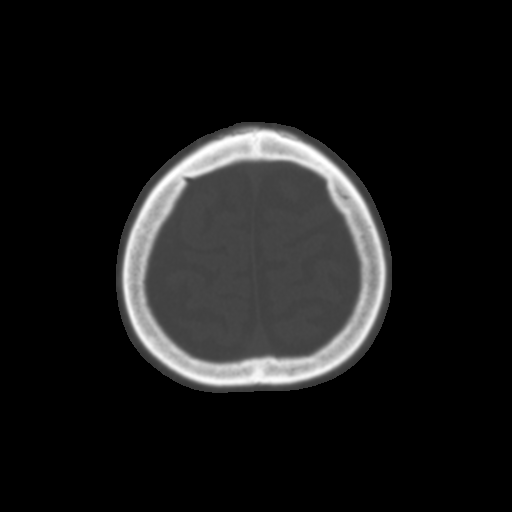
[im 73/81  brain]
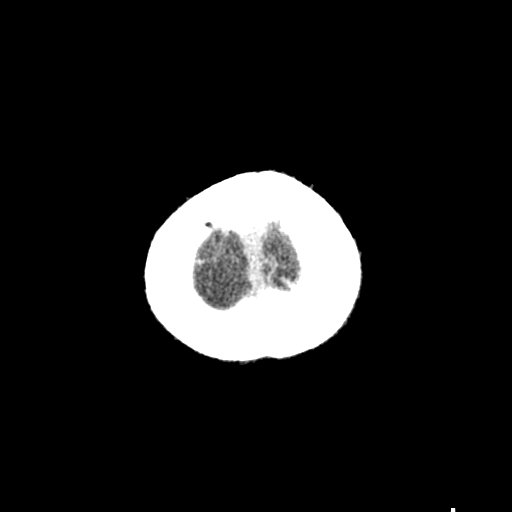

[16 of 30 positions shown; findings below may reference images not displayed]

FINDINGS: Skull and Sinuses:Negative for fracture or destructive process. The
visualized mastoids, middle ears, and imaged paranasal sinuses are
clear.

Visualized orbits: Sequela of right retinal detachment surgery,
including scleral band.

Brain: No evidence of acute infarction, hemorrhage, hydrocephalus,
or mass lesion/mass effect. ASPECTS is 10. No abnormal vessel
hyperdensity. Intracranial calcified atherosclerosis. Mild small
vessel ischemic change for age. Generalized cerebral volume loss.

Critical Value/emergent results were called by telephone at the time
of interpretation on 02/21/2015 at [DATE] to Dr. Uribe , who
verbally acknowledged these results.
IMPRESSION: No acute hemorrhage or visible infarct.

## 2017-12-26 DIAGNOSIS — E785 Hyperlipidemia, unspecified: Secondary | ICD-10-CM | POA: Diagnosis not present

## 2017-12-26 DIAGNOSIS — I10 Essential (primary) hypertension: Secondary | ICD-10-CM | POA: Diagnosis not present

## 2017-12-26 DIAGNOSIS — R5383 Other fatigue: Secondary | ICD-10-CM | POA: Diagnosis not present

## 2017-12-26 DIAGNOSIS — N183 Chronic kidney disease, stage 3 (moderate): Secondary | ICD-10-CM | POA: Diagnosis not present

## 2017-12-26 DIAGNOSIS — I4891 Unspecified atrial fibrillation: Secondary | ICD-10-CM | POA: Diagnosis not present

## 2017-12-26 DIAGNOSIS — R001 Bradycardia, unspecified: Secondary | ICD-10-CM | POA: Diagnosis not present

## 2017-12-26 DIAGNOSIS — I2510 Atherosclerotic heart disease of native coronary artery without angina pectoris: Secondary | ICD-10-CM | POA: Diagnosis not present

## 2017-12-26 DIAGNOSIS — I63512 Cerebral infarction due to unspecified occlusion or stenosis of left middle cerebral artery: Secondary | ICD-10-CM | POA: Diagnosis not present

## 2017-12-26 DIAGNOSIS — I701 Atherosclerosis of renal artery: Secondary | ICD-10-CM | POA: Diagnosis not present

## 2017-12-26 DIAGNOSIS — I119 Hypertensive heart disease without heart failure: Secondary | ICD-10-CM | POA: Diagnosis not present

## 2018-01-09 DIAGNOSIS — E785 Hyperlipidemia, unspecified: Secondary | ICD-10-CM | POA: Diagnosis not present

## 2018-01-09 DIAGNOSIS — I2510 Atherosclerotic heart disease of native coronary artery without angina pectoris: Secondary | ICD-10-CM | POA: Diagnosis not present

## 2018-01-09 DIAGNOSIS — N400 Enlarged prostate without lower urinary tract symptoms: Secondary | ICD-10-CM | POA: Diagnosis not present

## 2018-01-09 DIAGNOSIS — I119 Hypertensive heart disease without heart failure: Secondary | ICD-10-CM | POA: Diagnosis not present

## 2018-01-09 DIAGNOSIS — I63512 Cerebral infarction due to unspecified occlusion or stenosis of left middle cerebral artery: Secondary | ICD-10-CM | POA: Diagnosis not present

## 2018-01-09 DIAGNOSIS — N183 Chronic kidney disease, stage 3 (moderate): Secondary | ICD-10-CM | POA: Diagnosis not present

## 2018-01-09 DIAGNOSIS — I701 Atherosclerosis of renal artery: Secondary | ICD-10-CM | POA: Diagnosis not present

## 2018-01-09 DIAGNOSIS — M170 Bilateral primary osteoarthritis of knee: Secondary | ICD-10-CM | POA: Diagnosis not present

## 2018-01-09 DIAGNOSIS — I10 Essential (primary) hypertension: Secondary | ICD-10-CM | POA: Diagnosis not present

## 2018-01-09 DIAGNOSIS — I4891 Unspecified atrial fibrillation: Secondary | ICD-10-CM | POA: Diagnosis not present

## 2018-01-23 ENCOUNTER — Telehealth: Payer: Self-pay

## 2018-01-23 NOTE — Telephone Encounter (Signed)
Pt. Called c/o swelling in lower leg for about a week and a half. He is still on Eliquis. States he has an appt. With PCP next week. I let him know that we can see him but recommended he call PCP and get a sooner appt. Pt. Verbalized understanding and will let us know if anything changes. No further questions at this time.

## 2018-01-30 DIAGNOSIS — L84 Corns and callosities: Secondary | ICD-10-CM | POA: Diagnosis not present

## 2018-01-30 DIAGNOSIS — I701 Atherosclerosis of renal artery: Secondary | ICD-10-CM | POA: Diagnosis not present

## 2018-01-30 DIAGNOSIS — I63512 Cerebral infarction due to unspecified occlusion or stenosis of left middle cerebral artery: Secondary | ICD-10-CM | POA: Diagnosis not present

## 2018-01-30 DIAGNOSIS — R600 Localized edema: Secondary | ICD-10-CM | POA: Diagnosis not present

## 2018-01-30 DIAGNOSIS — I4891 Unspecified atrial fibrillation: Secondary | ICD-10-CM | POA: Diagnosis not present

## 2018-01-30 DIAGNOSIS — I2510 Atherosclerotic heart disease of native coronary artery without angina pectoris: Secondary | ICD-10-CM | POA: Diagnosis not present

## 2018-01-30 DIAGNOSIS — E785 Hyperlipidemia, unspecified: Secondary | ICD-10-CM | POA: Diagnosis not present

## 2018-01-30 DIAGNOSIS — R001 Bradycardia, unspecified: Secondary | ICD-10-CM | POA: Diagnosis not present

## 2018-01-30 DIAGNOSIS — N183 Chronic kidney disease, stage 3 (moderate): Secondary | ICD-10-CM | POA: Diagnosis not present

## 2018-01-30 DIAGNOSIS — I10 Essential (primary) hypertension: Secondary | ICD-10-CM | POA: Diagnosis not present

## 2018-01-31 ENCOUNTER — Ambulatory Visit: Payer: PPO | Admitting: Podiatry

## 2018-01-31 ENCOUNTER — Encounter: Payer: Self-pay | Admitting: Podiatry

## 2018-01-31 ENCOUNTER — Ambulatory Visit (INDEPENDENT_AMBULATORY_CARE_PROVIDER_SITE_OTHER): Payer: PPO

## 2018-01-31 VITALS — BP 165/72 | HR 50 | Resp 16

## 2018-01-31 DIAGNOSIS — M2040 Other hammer toe(s) (acquired), unspecified foot: Secondary | ICD-10-CM

## 2018-01-31 DIAGNOSIS — M2042 Other hammer toe(s) (acquired), left foot: Secondary | ICD-10-CM

## 2018-01-31 DIAGNOSIS — M21622 Bunionette of left foot: Secondary | ICD-10-CM | POA: Diagnosis not present

## 2018-01-31 DIAGNOSIS — M7752 Other enthesopathy of left foot: Secondary | ICD-10-CM

## 2018-01-31 DIAGNOSIS — M7751 Other enthesopathy of right foot: Secondary | ICD-10-CM | POA: Diagnosis not present

## 2018-01-31 DIAGNOSIS — M2041 Other hammer toe(s) (acquired), right foot: Secondary | ICD-10-CM | POA: Diagnosis not present

## 2018-01-31 DIAGNOSIS — M779 Enthesopathy, unspecified: Secondary | ICD-10-CM

## 2018-01-31 MED ORDER — TRIAMCINOLONE ACETONIDE 10 MG/ML IJ SUSP
10.0000 mg | Freq: Once | INTRAMUSCULAR | Status: AC
  Administered 2018-01-31: 10 mg

## 2018-01-31 MED ORDER — TRIAMCINOLONE ACETONIDE 10 MG/ML IJ SUSP: 10.0000 mg | Freq: Once | INTRAMUSCULAR | Status: DC

## 2018-01-31 NOTE — Progress Notes (Signed)
   Subjective:    Patient ID: [name], male    DOB: May 20, 1940, 78 y.o.   MRN: [id number]  HPI    Review of Systems  All other systems reviewed and are negative.      Objective:   Physical Exam        Assessment & Plan:

## 2018-02-01 NOTE — Progress Notes (Signed)
Subjective:   Patient ID: [name], male   DOB: 78 y.o.   MRN: [id number]   HPI Patient presents stating he is developing a lot of pain on the bottom of his right foot and developed a lot of discomfort on the outside of his left foot.  States he is always had poor foot structure and has had orthotics for a long time that did help him somewhat and states gradually getting worse with patient having history of significant cardiovascular disease and does have vascular disease which is monitored but there is nothing they can do to improve.  Patient does not smoke and would like to be more active   Review of Systems  All other systems reviewed and are negative.       Objective:  Physical Exam Vitals signs and nursing note reviewed.  Constitutional:      Appearance: He is well-developed.  Pulmonary:     Effort: Pulmonary effort is normal.  Musculoskeletal: Normal range of motion.  Skin:    General: Skin is warm.  Neurological:     Mental Status: He is alert.     Neurovascular status found to be diminished with diminished PT DP pulses bilateral.  Patient does have significant digital deformities bilateral with plantarflexed metatarsal with second metatarsal right which is quite exposed with keratotic tissue formation and inflammation around the head of the fifth MPJ left with fluid buildup and pain with palpation.  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  At this point I have recommended a thick orthotic to reduce the pressure against the second metatarsal and ped orthotist agreed and casted him for functional orthotics.  I went ahead did sterile prep  and carefully injected the head of the fifth MPJ left to reduce inflammation with 2 mg dexamethasone Kenalog 5 mg Xylocaine and advised on wider shoes and he will be seen back when orthotics are returned and this will not be a surgical candidate but hopefully we can keep it under control conservative  X-ray indicates that there is severe bony deformity with significant digital contracture and metatarsal prominence fifth MPJ left with no lysis of the

## 2018-02-13 DIAGNOSIS — D485 Neoplasm of uncertain behavior of skin: Secondary | ICD-10-CM | POA: Diagnosis not present

## 2018-02-13 DIAGNOSIS — L821 Other seborrheic keratosis: Secondary | ICD-10-CM | POA: Diagnosis not present

## 2018-02-13 DIAGNOSIS — L570 Actinic keratosis: Secondary | ICD-10-CM | POA: Diagnosis not present

## 2018-02-14 DIAGNOSIS — I4891 Unspecified atrial fibrillation: Secondary | ICD-10-CM | POA: Diagnosis not present

## 2018-02-14 DIAGNOSIS — N183 Chronic kidney disease, stage 3 (moderate): Secondary | ICD-10-CM | POA: Diagnosis not present

## 2018-02-14 DIAGNOSIS — N400 Enlarged prostate without lower urinary tract symptoms: Secondary | ICD-10-CM | POA: Diagnosis not present

## 2018-02-14 DIAGNOSIS — M170 Bilateral primary osteoarthritis of knee: Secondary | ICD-10-CM | POA: Diagnosis not present

## 2018-02-14 DIAGNOSIS — I701 Atherosclerosis of renal artery: Secondary | ICD-10-CM | POA: Diagnosis not present

## 2018-02-14 DIAGNOSIS — I63512 Cerebral infarction due to unspecified occlusion or stenosis of left middle cerebral artery: Secondary | ICD-10-CM | POA: Diagnosis not present

## 2018-02-14 DIAGNOSIS — I10 Essential (primary) hypertension: Secondary | ICD-10-CM | POA: Diagnosis not present

## 2018-02-14 DIAGNOSIS — E785 Hyperlipidemia, unspecified: Secondary | ICD-10-CM | POA: Diagnosis not present

## 2018-02-14 DIAGNOSIS — I119 Hypertensive heart disease without heart failure: Secondary | ICD-10-CM | POA: Diagnosis not present

## 2018-02-14 DIAGNOSIS — I2510 Atherosclerotic heart disease of native coronary artery without angina pectoris: Secondary | ICD-10-CM | POA: Diagnosis not present

## 2018-02-27 ENCOUNTER — Ambulatory Visit: Payer: PPO | Admitting: Orthotics

## 2018-02-27 DIAGNOSIS — M21622 Bunionette of left foot: Secondary | ICD-10-CM

## 2018-02-27 DIAGNOSIS — M779 Enthesopathy, unspecified: Secondary | ICD-10-CM

## 2018-02-27 DIAGNOSIS — M2040 Other hammer toe(s) (acquired), unspecified foot: Secondary | ICD-10-CM

## 2018-02-27 NOTE — Progress Notes (Signed)
Patient came in today to pick up custom made foot orthotics.  The goals were accomplished and the patient reported no dissatisfaction with said orthotics.  Patient was advised of breakin period and how to report any issues. 

## 2018-03-02 DIAGNOSIS — R05 Cough: Secondary | ICD-10-CM | POA: Diagnosis not present

## 2018-03-02 DIAGNOSIS — J209 Acute bronchitis, unspecified: Secondary | ICD-10-CM | POA: Diagnosis not present

## 2018-03-13 DIAGNOSIS — M170 Bilateral primary osteoarthritis of knee: Secondary | ICD-10-CM | POA: Diagnosis not present

## 2018-03-13 DIAGNOSIS — E785 Hyperlipidemia, unspecified: Secondary | ICD-10-CM | POA: Diagnosis not present

## 2018-03-13 DIAGNOSIS — N400 Enlarged prostate without lower urinary tract symptoms: Secondary | ICD-10-CM | POA: Diagnosis not present

## 2018-03-13 DIAGNOSIS — I2510 Atherosclerotic heart disease of native coronary artery without angina pectoris: Secondary | ICD-10-CM | POA: Diagnosis not present

## 2018-03-13 DIAGNOSIS — N183 Chronic kidney disease, stage 3 (moderate): Secondary | ICD-10-CM | POA: Diagnosis not present

## 2018-03-13 DIAGNOSIS — M159 Polyosteoarthritis, unspecified: Secondary | ICD-10-CM | POA: Diagnosis not present

## 2018-03-13 DIAGNOSIS — I63512 Cerebral infarction due to unspecified occlusion or stenosis of left middle cerebral artery: Secondary | ICD-10-CM | POA: Diagnosis not present

## 2018-03-13 DIAGNOSIS — I10 Essential (primary) hypertension: Secondary | ICD-10-CM | POA: Diagnosis not present

## 2018-03-13 DIAGNOSIS — I119 Hypertensive heart disease without heart failure: Secondary | ICD-10-CM | POA: Diagnosis not present

## 2018-03-13 DIAGNOSIS — I4891 Unspecified atrial fibrillation: Secondary | ICD-10-CM | POA: Diagnosis not present

## 2018-03-13 DIAGNOSIS — I701 Atherosclerosis of renal artery: Secondary | ICD-10-CM | POA: Diagnosis not present

## 2018-03-15 DIAGNOSIS — M546 Pain in thoracic spine: Secondary | ICD-10-CM | POA: Diagnosis not present

## 2018-03-15 DIAGNOSIS — M9901 Segmental and somatic dysfunction of cervical region: Secondary | ICD-10-CM | POA: Diagnosis not present

## 2018-03-15 DIAGNOSIS — M9902 Segmental and somatic dysfunction of thoracic region: Secondary | ICD-10-CM | POA: Diagnosis not present

## 2018-03-15 DIAGNOSIS — M542 Cervicalgia: Secondary | ICD-10-CM | POA: Diagnosis not present

## 2018-03-19 DIAGNOSIS — M9901 Segmental and somatic dysfunction of cervical region: Secondary | ICD-10-CM | POA: Diagnosis not present

## 2018-03-19 DIAGNOSIS — M542 Cervicalgia: Secondary | ICD-10-CM | POA: Diagnosis not present

## 2018-03-19 DIAGNOSIS — M546 Pain in thoracic spine: Secondary | ICD-10-CM | POA: Diagnosis not present

## 2018-03-19 DIAGNOSIS — M9902 Segmental and somatic dysfunction of thoracic region: Secondary | ICD-10-CM | POA: Diagnosis not present

## 2018-03-22 DIAGNOSIS — M9901 Segmental and somatic dysfunction of cervical region: Secondary | ICD-10-CM | POA: Diagnosis not present

## 2018-03-22 DIAGNOSIS — M542 Cervicalgia: Secondary | ICD-10-CM | POA: Diagnosis not present

## 2018-03-22 DIAGNOSIS — M546 Pain in thoracic spine: Secondary | ICD-10-CM | POA: Diagnosis not present

## 2018-03-22 DIAGNOSIS — M9902 Segmental and somatic dysfunction of thoracic region: Secondary | ICD-10-CM | POA: Diagnosis not present

## 2018-03-27 DIAGNOSIS — M9901 Segmental and somatic dysfunction of cervical region: Secondary | ICD-10-CM | POA: Diagnosis not present

## 2018-03-27 DIAGNOSIS — M9902 Segmental and somatic dysfunction of thoracic region: Secondary | ICD-10-CM | POA: Diagnosis not present

## 2018-03-27 DIAGNOSIS — M546 Pain in thoracic spine: Secondary | ICD-10-CM | POA: Diagnosis not present

## 2018-03-27 DIAGNOSIS — M542 Cervicalgia: Secondary | ICD-10-CM | POA: Diagnosis not present

## 2018-03-29 DIAGNOSIS — M546 Pain in thoracic spine: Secondary | ICD-10-CM | POA: Diagnosis not present

## 2018-03-29 DIAGNOSIS — M9902 Segmental and somatic dysfunction of thoracic region: Secondary | ICD-10-CM | POA: Diagnosis not present

## 2018-03-29 DIAGNOSIS — M542 Cervicalgia: Secondary | ICD-10-CM | POA: Diagnosis not present

## 2018-03-29 DIAGNOSIS — M9901 Segmental and somatic dysfunction of cervical region: Secondary | ICD-10-CM | POA: Diagnosis not present

## 2018-04-02 DIAGNOSIS — M9901 Segmental and somatic dysfunction of cervical region: Secondary | ICD-10-CM | POA: Diagnosis not present

## 2018-04-02 DIAGNOSIS — M542 Cervicalgia: Secondary | ICD-10-CM | POA: Diagnosis not present

## 2018-04-02 DIAGNOSIS — M546 Pain in thoracic spine: Secondary | ICD-10-CM | POA: Diagnosis not present

## 2018-04-02 DIAGNOSIS — M9902 Segmental and somatic dysfunction of thoracic region: Secondary | ICD-10-CM | POA: Diagnosis not present

## 2018-04-04 ENCOUNTER — Other Ambulatory Visit: Payer: Self-pay

## 2018-04-04 ENCOUNTER — Encounter: Payer: Self-pay | Admitting: Podiatry

## 2018-04-04 ENCOUNTER — Ambulatory Visit: Payer: PPO | Admitting: Podiatry

## 2018-04-04 DIAGNOSIS — M21622 Bunionette of left foot: Secondary | ICD-10-CM

## 2018-04-04 DIAGNOSIS — M2040 Other hammer toe(s) (acquired), unspecified foot: Secondary | ICD-10-CM | POA: Diagnosis not present

## 2018-04-05 DIAGNOSIS — M542 Cervicalgia: Secondary | ICD-10-CM | POA: Diagnosis not present

## 2018-04-05 DIAGNOSIS — M9902 Segmental and somatic dysfunction of thoracic region: Secondary | ICD-10-CM | POA: Diagnosis not present

## 2018-04-05 DIAGNOSIS — M9901 Segmental and somatic dysfunction of cervical region: Secondary | ICD-10-CM | POA: Diagnosis not present

## 2018-04-05 DIAGNOSIS — M546 Pain in thoracic spine: Secondary | ICD-10-CM | POA: Diagnosis not present

## 2018-04-08 NOTE — Progress Notes (Signed)
Subjective:   Patient ID: [name], male   DOB: 78 y.o.   MRN: [id number]   HPI Patient presents with a reoccurrence of pain around the fifth metatarsal head left and states that he wants to know if anything can be done besides surgery   ROS      Objective:  Physical Exam  Neurovascular status intact with patient found to have inflammation of the head of the fifth metatarsal left.  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  I am sending a note to his vascular doctor to get confirmation of healing and I advised him on wider shoes and warm water soaks and he will be seen back for [place] to recheck and again I educated him at great length today on the surgical intervention that would be necessary for this problem

## 2018-04-09 DIAGNOSIS — M546 Pain in thoracic spine: Secondary | ICD-10-CM | POA: Diagnosis not present

## 2018-04-09 DIAGNOSIS — M542 Cervicalgia: Secondary | ICD-10-CM | POA: Diagnosis not present

## 2018-04-09 DIAGNOSIS — M9902 Segmental and somatic dysfunction of thoracic region: Secondary | ICD-10-CM | POA: Diagnosis not present

## 2018-04-09 DIAGNOSIS — M9901 Segmental and somatic dysfunction of cervical region: Secondary | ICD-10-CM | POA: Diagnosis not present

## 2018-04-12 DIAGNOSIS — M9902 Segmental and somatic dysfunction of thoracic region: Secondary | ICD-10-CM | POA: Diagnosis not present

## 2018-04-12 DIAGNOSIS — M546 Pain in thoracic spine: Secondary | ICD-10-CM | POA: Diagnosis not present

## 2018-04-12 DIAGNOSIS — M9901 Segmental and somatic dysfunction of cervical region: Secondary | ICD-10-CM | POA: Diagnosis not present

## 2018-04-12 DIAGNOSIS — M542 Cervicalgia: Secondary | ICD-10-CM | POA: Diagnosis not present

## 2018-04-13 DIAGNOSIS — N400 Enlarged prostate without lower urinary tract symptoms: Secondary | ICD-10-CM | POA: Diagnosis not present

## 2018-04-13 DIAGNOSIS — I10 Essential (primary) hypertension: Secondary | ICD-10-CM | POA: Diagnosis not present

## 2018-04-13 DIAGNOSIS — I119 Hypertensive heart disease without heart failure: Secondary | ICD-10-CM | POA: Diagnosis not present

## 2018-04-13 DIAGNOSIS — I701 Atherosclerosis of renal artery: Secondary | ICD-10-CM | POA: Diagnosis not present

## 2018-04-13 DIAGNOSIS — I4891 Unspecified atrial fibrillation: Secondary | ICD-10-CM | POA: Diagnosis not present

## 2018-04-13 DIAGNOSIS — I2510 Atherosclerotic heart disease of native coronary artery without angina pectoris: Secondary | ICD-10-CM | POA: Diagnosis not present

## 2018-04-13 DIAGNOSIS — I63512 Cerebral infarction due to unspecified occlusion or stenosis of left middle cerebral artery: Secondary | ICD-10-CM | POA: Diagnosis not present

## 2018-04-13 DIAGNOSIS — M170 Bilateral primary osteoarthritis of knee: Secondary | ICD-10-CM | POA: Diagnosis not present

## 2018-04-13 DIAGNOSIS — E785 Hyperlipidemia, unspecified: Secondary | ICD-10-CM | POA: Diagnosis not present

## 2018-04-13 DIAGNOSIS — N183 Chronic kidney disease, stage 3 (moderate): Secondary | ICD-10-CM | POA: Diagnosis not present

## 2018-04-16 DIAGNOSIS — M542 Cervicalgia: Secondary | ICD-10-CM | POA: Diagnosis not present

## 2018-04-16 DIAGNOSIS — M9901 Segmental and somatic dysfunction of cervical region: Secondary | ICD-10-CM | POA: Diagnosis not present

## 2018-04-16 DIAGNOSIS — M546 Pain in thoracic spine: Secondary | ICD-10-CM | POA: Diagnosis not present

## 2018-04-16 DIAGNOSIS — M9902 Segmental and somatic dysfunction of thoracic region: Secondary | ICD-10-CM | POA: Diagnosis not present

## 2018-04-23 DIAGNOSIS — M9902 Segmental and somatic dysfunction of thoracic region: Secondary | ICD-10-CM | POA: Diagnosis not present

## 2018-04-23 DIAGNOSIS — M9901 Segmental and somatic dysfunction of cervical region: Secondary | ICD-10-CM | POA: Diagnosis not present

## 2018-04-23 DIAGNOSIS — M542 Cervicalgia: Secondary | ICD-10-CM | POA: Diagnosis not present

## 2018-04-23 DIAGNOSIS — M546 Pain in thoracic spine: Secondary | ICD-10-CM | POA: Diagnosis not present

## 2018-04-26 DIAGNOSIS — M542 Cervicalgia: Secondary | ICD-10-CM | POA: Diagnosis not present

## 2018-04-26 DIAGNOSIS — M9901 Segmental and somatic dysfunction of cervical region: Secondary | ICD-10-CM | POA: Diagnosis not present

## 2018-04-26 DIAGNOSIS — M546 Pain in thoracic spine: Secondary | ICD-10-CM | POA: Diagnosis not present

## 2018-04-26 DIAGNOSIS — M9902 Segmental and somatic dysfunction of thoracic region: Secondary | ICD-10-CM | POA: Diagnosis not present

## 2018-05-01 DIAGNOSIS — M9902 Segmental and somatic dysfunction of thoracic region: Secondary | ICD-10-CM | POA: Diagnosis not present

## 2018-05-01 DIAGNOSIS — M9901 Segmental and somatic dysfunction of cervical region: Secondary | ICD-10-CM | POA: Diagnosis not present

## 2018-05-01 DIAGNOSIS — M542 Cervicalgia: Secondary | ICD-10-CM | POA: Diagnosis not present

## 2018-05-01 DIAGNOSIS — M546 Pain in thoracic spine: Secondary | ICD-10-CM | POA: Diagnosis not present

## 2018-05-03 DIAGNOSIS — M9902 Segmental and somatic dysfunction of thoracic region: Secondary | ICD-10-CM | POA: Diagnosis not present

## 2018-05-03 DIAGNOSIS — M542 Cervicalgia: Secondary | ICD-10-CM | POA: Diagnosis not present

## 2018-05-03 DIAGNOSIS — M9901 Segmental and somatic dysfunction of cervical region: Secondary | ICD-10-CM | POA: Diagnosis not present

## 2018-05-03 DIAGNOSIS — M546 Pain in thoracic spine: Secondary | ICD-10-CM | POA: Diagnosis not present

## 2018-05-07 DIAGNOSIS — M542 Cervicalgia: Secondary | ICD-10-CM | POA: Diagnosis not present

## 2018-05-07 DIAGNOSIS — M546 Pain in thoracic spine: Secondary | ICD-10-CM | POA: Diagnosis not present

## 2018-05-07 DIAGNOSIS — M9902 Segmental and somatic dysfunction of thoracic region: Secondary | ICD-10-CM | POA: Diagnosis not present

## 2018-05-07 DIAGNOSIS — M9901 Segmental and somatic dysfunction of cervical region: Secondary | ICD-10-CM | POA: Diagnosis not present

## 2018-05-10 DIAGNOSIS — M542 Cervicalgia: Secondary | ICD-10-CM | POA: Diagnosis not present

## 2018-05-10 DIAGNOSIS — M9901 Segmental and somatic dysfunction of cervical region: Secondary | ICD-10-CM | POA: Diagnosis not present

## 2018-05-10 DIAGNOSIS — M546 Pain in thoracic spine: Secondary | ICD-10-CM | POA: Diagnosis not present

## 2018-05-10 DIAGNOSIS — M9902 Segmental and somatic dysfunction of thoracic region: Secondary | ICD-10-CM | POA: Diagnosis not present

## 2018-05-14 DIAGNOSIS — E785 Hyperlipidemia, unspecified: Secondary | ICD-10-CM | POA: Diagnosis not present

## 2018-05-14 DIAGNOSIS — I119 Hypertensive heart disease without heart failure: Secondary | ICD-10-CM | POA: Diagnosis not present

## 2018-05-14 DIAGNOSIS — I4891 Unspecified atrial fibrillation: Secondary | ICD-10-CM | POA: Diagnosis not present

## 2018-05-14 DIAGNOSIS — M546 Pain in thoracic spine: Secondary | ICD-10-CM | POA: Diagnosis not present

## 2018-05-14 DIAGNOSIS — M9902 Segmental and somatic dysfunction of thoracic region: Secondary | ICD-10-CM | POA: Diagnosis not present

## 2018-05-14 DIAGNOSIS — N400 Enlarged prostate without lower urinary tract symptoms: Secondary | ICD-10-CM | POA: Diagnosis not present

## 2018-05-14 DIAGNOSIS — I2510 Atherosclerotic heart disease of native coronary artery without angina pectoris: Secondary | ICD-10-CM | POA: Diagnosis not present

## 2018-05-14 DIAGNOSIS — M9901 Segmental and somatic dysfunction of cervical region: Secondary | ICD-10-CM | POA: Diagnosis not present

## 2018-05-14 DIAGNOSIS — I63512 Cerebral infarction due to unspecified occlusion or stenosis of left middle cerebral artery: Secondary | ICD-10-CM | POA: Diagnosis not present

## 2018-05-14 DIAGNOSIS — I10 Essential (primary) hypertension: Secondary | ICD-10-CM | POA: Diagnosis not present

## 2018-05-14 DIAGNOSIS — N183 Chronic kidney disease, stage 3 (moderate): Secondary | ICD-10-CM | POA: Diagnosis not present

## 2018-05-14 DIAGNOSIS — M170 Bilateral primary osteoarthritis of knee: Secondary | ICD-10-CM | POA: Diagnosis not present

## 2018-05-14 DIAGNOSIS — M542 Cervicalgia: Secondary | ICD-10-CM | POA: Diagnosis not present

## 2018-05-14 DIAGNOSIS — I701 Atherosclerosis of renal artery: Secondary | ICD-10-CM | POA: Diagnosis not present

## 2018-05-16 DIAGNOSIS — M9902 Segmental and somatic dysfunction of thoracic region: Secondary | ICD-10-CM | POA: Diagnosis not present

## 2018-05-16 DIAGNOSIS — M542 Cervicalgia: Secondary | ICD-10-CM | POA: Diagnosis not present

## 2018-05-16 DIAGNOSIS — M9901 Segmental and somatic dysfunction of cervical region: Secondary | ICD-10-CM | POA: Diagnosis not present

## 2018-05-16 DIAGNOSIS — M546 Pain in thoracic spine: Secondary | ICD-10-CM | POA: Diagnosis not present

## 2018-05-17 DIAGNOSIS — M546 Pain in thoracic spine: Secondary | ICD-10-CM | POA: Diagnosis not present

## 2018-05-17 DIAGNOSIS — M9901 Segmental and somatic dysfunction of cervical region: Secondary | ICD-10-CM | POA: Diagnosis not present

## 2018-05-17 DIAGNOSIS — M542 Cervicalgia: Secondary | ICD-10-CM | POA: Diagnosis not present

## 2018-05-17 DIAGNOSIS — M9902 Segmental and somatic dysfunction of thoracic region: Secondary | ICD-10-CM | POA: Diagnosis not present

## 2018-05-21 DIAGNOSIS — M9902 Segmental and somatic dysfunction of thoracic region: Secondary | ICD-10-CM | POA: Diagnosis not present

## 2018-05-21 DIAGNOSIS — M546 Pain in thoracic spine: Secondary | ICD-10-CM | POA: Diagnosis not present

## 2018-05-21 DIAGNOSIS — M9901 Segmental and somatic dysfunction of cervical region: Secondary | ICD-10-CM | POA: Diagnosis not present

## 2018-05-21 DIAGNOSIS — M542 Cervicalgia: Secondary | ICD-10-CM | POA: Diagnosis not present

## 2018-05-24 DIAGNOSIS — M9901 Segmental and somatic dysfunction of cervical region: Secondary | ICD-10-CM | POA: Diagnosis not present

## 2018-05-24 DIAGNOSIS — M9902 Segmental and somatic dysfunction of thoracic region: Secondary | ICD-10-CM | POA: Diagnosis not present

## 2018-05-24 DIAGNOSIS — M546 Pain in thoracic spine: Secondary | ICD-10-CM | POA: Diagnosis not present

## 2018-05-24 DIAGNOSIS — M542 Cervicalgia: Secondary | ICD-10-CM | POA: Diagnosis not present

## 2018-05-28 DIAGNOSIS — M542 Cervicalgia: Secondary | ICD-10-CM | POA: Diagnosis not present

## 2018-05-28 DIAGNOSIS — M9901 Segmental and somatic dysfunction of cervical region: Secondary | ICD-10-CM | POA: Diagnosis not present

## 2018-05-28 DIAGNOSIS — M546 Pain in thoracic spine: Secondary | ICD-10-CM | POA: Diagnosis not present

## 2018-05-28 DIAGNOSIS — M9902 Segmental and somatic dysfunction of thoracic region: Secondary | ICD-10-CM | POA: Diagnosis not present

## 2018-05-29 DIAGNOSIS — M545 Low back pain: Secondary | ICD-10-CM | POA: Diagnosis not present

## 2018-05-31 DIAGNOSIS — M542 Cervicalgia: Secondary | ICD-10-CM | POA: Diagnosis not present

## 2018-05-31 DIAGNOSIS — M9902 Segmental and somatic dysfunction of thoracic region: Secondary | ICD-10-CM | POA: Diagnosis not present

## 2018-05-31 DIAGNOSIS — M9901 Segmental and somatic dysfunction of cervical region: Secondary | ICD-10-CM | POA: Diagnosis not present

## 2018-05-31 DIAGNOSIS — M546 Pain in thoracic spine: Secondary | ICD-10-CM | POA: Diagnosis not present

## 2018-06-05 ENCOUNTER — Telehealth: Payer: Self-pay

## 2018-06-05 NOTE — Telephone Encounter (Signed)
Pt called and said that he had surgery in 2000 and wanted to know if the device used or his substitute aorta is able compatible with an MRI machine.   Advised pt to let the MRI folks know that he has a 16x8 hemoshield and they would know if it can be in the room with the MRI.   Roger Shelter, CMA

## 2018-06-07 DIAGNOSIS — M545 Low back pain: Secondary | ICD-10-CM | POA: Diagnosis not present

## 2018-06-12 DIAGNOSIS — I10 Essential (primary) hypertension: Secondary | ICD-10-CM | POA: Diagnosis not present

## 2018-06-12 DIAGNOSIS — I4891 Unspecified atrial fibrillation: Secondary | ICD-10-CM | POA: Diagnosis not present

## 2018-06-12 DIAGNOSIS — N183 Chronic kidney disease, stage 3 (moderate): Secondary | ICD-10-CM | POA: Diagnosis not present

## 2018-06-12 DIAGNOSIS — E785 Hyperlipidemia, unspecified: Secondary | ICD-10-CM | POA: Diagnosis not present

## 2018-06-12 DIAGNOSIS — I701 Atherosclerosis of renal artery: Secondary | ICD-10-CM | POA: Diagnosis not present

## 2018-06-12 DIAGNOSIS — M170 Bilateral primary osteoarthritis of knee: Secondary | ICD-10-CM | POA: Diagnosis not present

## 2018-06-12 DIAGNOSIS — I119 Hypertensive heart disease without heart failure: Secondary | ICD-10-CM | POA: Diagnosis not present

## 2018-06-12 DIAGNOSIS — I2510 Atherosclerotic heart disease of native coronary artery without angina pectoris: Secondary | ICD-10-CM | POA: Diagnosis not present

## 2018-06-12 DIAGNOSIS — I63512 Cerebral infarction due to unspecified occlusion or stenosis of left middle cerebral artery: Secondary | ICD-10-CM | POA: Diagnosis not present

## 2018-06-12 DIAGNOSIS — N400 Enlarged prostate without lower urinary tract symptoms: Secondary | ICD-10-CM | POA: Diagnosis not present

## 2018-06-13 ENCOUNTER — Other Ambulatory Visit: Payer: Self-pay | Admitting: Neurological Surgery

## 2018-06-13 DIAGNOSIS — M545 Low back pain, unspecified: Secondary | ICD-10-CM

## 2018-06-26 DIAGNOSIS — I701 Atherosclerosis of renal artery: Secondary | ICD-10-CM | POA: Diagnosis not present

## 2018-06-26 DIAGNOSIS — I4891 Unspecified atrial fibrillation: Secondary | ICD-10-CM | POA: Diagnosis not present

## 2018-06-26 DIAGNOSIS — Z0001 Encounter for general adult medical examination with abnormal findings: Secondary | ICD-10-CM | POA: Diagnosis not present

## 2018-06-26 DIAGNOSIS — E785 Hyperlipidemia, unspecified: Secondary | ICD-10-CM | POA: Diagnosis not present

## 2018-06-26 DIAGNOSIS — N183 Chronic kidney disease, stage 3 (moderate): Secondary | ICD-10-CM | POA: Diagnosis not present

## 2018-06-26 DIAGNOSIS — N400 Enlarged prostate without lower urinary tract symptoms: Secondary | ICD-10-CM | POA: Diagnosis not present

## 2018-06-26 DIAGNOSIS — I119 Hypertensive heart disease without heart failure: Secondary | ICD-10-CM | POA: Diagnosis not present

## 2018-06-26 DIAGNOSIS — M170 Bilateral primary osteoarthritis of knee: Secondary | ICD-10-CM | POA: Diagnosis not present

## 2018-06-26 DIAGNOSIS — I63512 Cerebral infarction due to unspecified occlusion or stenosis of left middle cerebral artery: Secondary | ICD-10-CM | POA: Diagnosis not present

## 2018-06-26 DIAGNOSIS — Z1389 Encounter for screening for other disorder: Secondary | ICD-10-CM | POA: Diagnosis not present

## 2018-06-26 DIAGNOSIS — E559 Vitamin D deficiency, unspecified: Secondary | ICD-10-CM | POA: Diagnosis not present

## 2018-06-30 ENCOUNTER — Ambulatory Visit
Admission: RE | Admit: 2018-06-30 | Discharge: 2018-06-30 | Disposition: A | Discharge status: Home / Self Care | Payer: PPO | Source: Ambulatory Visit | Attending: Neurological Surgery | Admitting: Neurological Surgery

## 2018-06-30 DIAGNOSIS — M48061 Spinal stenosis, lumbar region without neurogenic claudication: Secondary | ICD-10-CM | POA: Diagnosis not present

## 2018-06-30 DIAGNOSIS — M545 Low back pain, unspecified: Secondary | ICD-10-CM

## 2018-07-03 DIAGNOSIS — M5416 Radiculopathy, lumbar region: Secondary | ICD-10-CM | POA: Diagnosis not present

## 2018-07-03 DIAGNOSIS — M4316 Spondylolisthesis, lumbar region: Secondary | ICD-10-CM | POA: Diagnosis not present

## 2018-07-10 DIAGNOSIS — N183 Chronic kidney disease, stage 3 (moderate): Secondary | ICD-10-CM | POA: Diagnosis not present

## 2018-07-10 DIAGNOSIS — I119 Hypertensive heart disease without heart failure: Secondary | ICD-10-CM | POA: Diagnosis not present

## 2018-07-10 DIAGNOSIS — N400 Enlarged prostate without lower urinary tract symptoms: Secondary | ICD-10-CM | POA: Diagnosis not present

## 2018-07-10 DIAGNOSIS — I10 Essential (primary) hypertension: Secondary | ICD-10-CM | POA: Diagnosis not present

## 2018-07-10 DIAGNOSIS — E785 Hyperlipidemia, unspecified: Secondary | ICD-10-CM | POA: Diagnosis not present

## 2018-07-10 DIAGNOSIS — M170 Bilateral primary osteoarthritis of knee: Secondary | ICD-10-CM | POA: Diagnosis not present

## 2018-07-10 DIAGNOSIS — I63512 Cerebral infarction due to unspecified occlusion or stenosis of left middle cerebral artery: Secondary | ICD-10-CM | POA: Diagnosis not present

## 2018-07-10 DIAGNOSIS — I2510 Atherosclerotic heart disease of native coronary artery without angina pectoris: Secondary | ICD-10-CM | POA: Diagnosis not present

## 2018-07-10 DIAGNOSIS — I4891 Unspecified atrial fibrillation: Secondary | ICD-10-CM | POA: Diagnosis not present

## 2018-07-10 DIAGNOSIS — I701 Atherosclerosis of renal artery: Secondary | ICD-10-CM | POA: Diagnosis not present

## 2018-07-19 ENCOUNTER — Telehealth: Payer: Self-pay | Admitting: Cardiology

## 2018-07-19 NOTE — Telephone Encounter (Signed)
° °  [facility] Pre-operative Risk Assessment    Request for surgical clearance:  1. What type of surgery is being performed? Epidural steroid injection  2. When is this surgery scheduled? 07/24/18  3. What type of clearance is required (medical clearance vs. Pharmacy clearance to hold med vs. Both)? Pharmacy clearance   4. Are there any medications that need to be held prior to surgery and how long? Eliquis, 3 days prior   5. Practice name and name of physician performing surgery? Dr. Suella Broad, [facility]   6. What is your office phone number [phone] ext [phone]   7.   What is your office fax number [phone]  8.   Anesthesia type (None, local, MAC, general) ? None    [name] 07/19/2018, 8:10 AM  _________________________________________________________________   (provider comments below)

## 2018-07-19 NOTE — Telephone Encounter (Signed)
Pt takes Eliquis for afib with CHADS2VASc score of 6 (age x2, HTN, stroke, CAD/PVD). 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 CrCl is 58mL/min.  Similar request to clearance received in May 2019 - Dr Radford Pax was consulted then and was ok with patient holding Eliquis for 3 days prior to spinal procedure. No major changes noted since last clearance, ok to hold Eliquis for 3 days prior to upcoming ESI.

## 2018-07-19 NOTE — Telephone Encounter (Signed)
   Primary Cardiologist: Fransico Him, MD  Chart reviewed as part of pre-operative protocol coverage. Given past medical history and time since last visit, based on ACC/AHA guidelines, [name] would be at acceptable risk for the planned procedure without further cardiovascular testing.   Ok to hold Eliquis 3 days pre op if needed.   I will route this recommendation to the requesting party via Epic fax function and remove from pre-op pool.  Please call with questions.  Kerin Ransom, PA-C 07/19/2018, 11:03 AM

## 2018-07-23 DIAGNOSIS — I701 Atherosclerosis of renal artery: Secondary | ICD-10-CM | POA: Diagnosis not present

## 2018-07-23 DIAGNOSIS — I2510 Atherosclerotic heart disease of native coronary artery without angina pectoris: Secondary | ICD-10-CM | POA: Diagnosis not present

## 2018-07-23 DIAGNOSIS — N183 Chronic kidney disease, stage 3 (moderate): Secondary | ICD-10-CM | POA: Diagnosis not present

## 2018-07-23 DIAGNOSIS — R002 Palpitations: Secondary | ICD-10-CM | POA: Diagnosis not present

## 2018-07-23 DIAGNOSIS — I119 Hypertensive heart disease without heart failure: Secondary | ICD-10-CM | POA: Diagnosis not present

## 2018-07-23 DIAGNOSIS — I4891 Unspecified atrial fibrillation: Secondary | ICD-10-CM | POA: Diagnosis not present

## 2018-07-24 ENCOUNTER — Telehealth: Payer: Self-pay | Admitting: Cardiology

## 2018-07-24 DIAGNOSIS — R002 Palpitations: Secondary | ICD-10-CM

## 2018-07-24 DIAGNOSIS — M5136 Other intervertebral disc degeneration, lumbar region: Secondary | ICD-10-CM | POA: Diagnosis not present

## 2018-07-24 NOTE — Telephone Encounter (Signed)
New message   Patient c/o Palpitations:  High priority if patient c/o lightheadedness, shortness of breath, or chest pain  1) How long have you had palpitations/irregular HR/ Afib? Are you having the symptoms now? Patient states that been going on for about 8 days with palpitations, no  2) Are you currently experiencing lightheadedness, SOB or CP? No   3) Do you have a history of afib (atrial fibrillation) or irregular heart rhythm? No   4) Have you checked your BP or HR? (document readings if available): b/p 130-145 on high side, hr between  48-52   5) Are you experiencing any other symptoms? No

## 2018-07-24 NOTE — Telephone Encounter (Signed)
Spoke with the patient, he has been feeling his heart "quivering", he said it beats 4 times then pauses or beats 3 times then pauses, etc. He said it only occurs between 2-5 am. He wakes up to the sensation in the early morning. He said it does not occur everyday. When it does occur if he gets out of bed and sit in his recliner, it resolves faster. He denied any other symptoms related to it. He states it only occurs when he wakes up in the morning. He never has felt it before going to bed. He denies SOB, chest pain or dizziness. He is confused about what the problem is since he has never felt anything like it before. The quivering started about 8 days ago. The symptoms in the morning can last 15-25 minutes before they resolve.

## 2018-07-24 NOTE — Telephone Encounter (Signed)
Please get a 2 week Ziopatch monitor

## 2018-07-25 ENCOUNTER — Telehealth: Payer: Self-pay | Admitting: Radiology

## 2018-07-25 NOTE — Telephone Encounter (Signed)
The patient only wanted to wear the monitor for a week. Order placed and message sent to schedule.

## 2018-07-25 NOTE — Telephone Encounter (Signed)
Enrolled patient for a 14 day Zio monitor to be mailed. Brief instructions were gone over with patient and he knows to expect the monitor to arrive in 3-4 days

## 2018-07-31 ENCOUNTER — Ambulatory Visit (INDEPENDENT_AMBULATORY_CARE_PROVIDER_SITE_OTHER): Payer: PPO

## 2018-07-31 DIAGNOSIS — R002 Palpitations: Secondary | ICD-10-CM

## 2018-08-09 ENCOUNTER — Other Ambulatory Visit: Payer: Self-pay | Admitting: Cardiology

## 2018-08-14 DIAGNOSIS — L821 Other seborrheic keratosis: Secondary | ICD-10-CM | POA: Diagnosis not present

## 2018-08-14 DIAGNOSIS — C44319 Basal cell carcinoma of skin of other parts of face: Secondary | ICD-10-CM | POA: Diagnosis not present

## 2018-08-14 DIAGNOSIS — D692 Other nonthrombocytopenic purpura: Secondary | ICD-10-CM | POA: Diagnosis not present

## 2018-08-14 DIAGNOSIS — L565 Disseminated superficial actinic porokeratosis (DSAP): Secondary | ICD-10-CM | POA: Diagnosis not present

## 2018-08-14 DIAGNOSIS — D485 Neoplasm of uncertain behavior of skin: Secondary | ICD-10-CM | POA: Diagnosis not present

## 2018-08-14 DIAGNOSIS — L570 Actinic keratosis: Secondary | ICD-10-CM | POA: Diagnosis not present

## 2018-08-14 DIAGNOSIS — L814 Other melanin hyperpigmentation: Secondary | ICD-10-CM | POA: Diagnosis not present

## 2018-08-17 DIAGNOSIS — I2510 Atherosclerotic heart disease of native coronary artery without angina pectoris: Secondary | ICD-10-CM | POA: Diagnosis not present

## 2018-08-17 DIAGNOSIS — N183 Chronic kidney disease, stage 3 (moderate): Secondary | ICD-10-CM | POA: Diagnosis not present

## 2018-08-17 DIAGNOSIS — N400 Enlarged prostate without lower urinary tract symptoms: Secondary | ICD-10-CM | POA: Diagnosis not present

## 2018-08-17 DIAGNOSIS — M170 Bilateral primary osteoarthritis of knee: Secondary | ICD-10-CM | POA: Diagnosis not present

## 2018-08-17 DIAGNOSIS — I119 Hypertensive heart disease without heart failure: Secondary | ICD-10-CM | POA: Diagnosis not present

## 2018-08-17 DIAGNOSIS — I4891 Unspecified atrial fibrillation: Secondary | ICD-10-CM | POA: Diagnosis not present

## 2018-08-17 DIAGNOSIS — I10 Essential (primary) hypertension: Secondary | ICD-10-CM | POA: Diagnosis not present

## 2018-08-17 DIAGNOSIS — M159 Polyosteoarthritis, unspecified: Secondary | ICD-10-CM | POA: Diagnosis not present

## 2018-08-17 DIAGNOSIS — E7800 Pure hypercholesterolemia, unspecified: Secondary | ICD-10-CM | POA: Diagnosis not present

## 2018-08-17 DIAGNOSIS — I701 Atherosclerosis of renal artery: Secondary | ICD-10-CM | POA: Diagnosis not present

## 2018-08-17 DIAGNOSIS — E785 Hyperlipidemia, unspecified: Secondary | ICD-10-CM | POA: Diagnosis not present

## 2018-08-17 DIAGNOSIS — I63512 Cerebral infarction due to unspecified occlusion or stenosis of left middle cerebral artery: Secondary | ICD-10-CM | POA: Diagnosis not present

## 2018-08-20 ENCOUNTER — Telehealth: Payer: Self-pay | Admitting: Cardiology

## 2018-08-20 DIAGNOSIS — R002 Palpitations: Secondary | ICD-10-CM | POA: Diagnosis not present

## 2018-08-20 DIAGNOSIS — I495 Sick sinus syndrome: Secondary | ICD-10-CM

## 2018-08-20 NOTE — Telephone Encounter (Addendum)
Lm for pt to call back to discuss

## 2018-08-20 NOTE — Telephone Encounter (Signed)
New Message    Abnormal Zio Patch results.

## 2018-08-20 NOTE — Telephone Encounter (Signed)
Please have him stop Amio and order a sleep study to rule out OSA induced brady

## 2018-08-20 NOTE — Telephone Encounter (Signed)
Returned call to Pt to discuss symptoms when Pt had low heart rate on monitor.  Pt is cardiac aware.  Pt reports every other night in the early morning hours his heart will speed up which wakes him from sleep and then he will feel his heart pause.  This sensation causes Pt to feel like "I'm having an anxiety attack" and then he gets up to sit in his den for awhile with the lights on until he feels more calm and then he goes back to bed.  Verified Pt is currently taking all of the following medications: Amiodarone 100 mg daily Amlodipine 5 mg daily Clonidine 0.1 mg daily-states he takes one tablet at bedtime Hydralazine 25 mg daily Losartan 50 mg daily  Advised Pt this nurse would request monitor results (Pt returned monitor 1.5 weeks ago) and send symptoms to Dr. Radford Pax for review.  Advised Pt office would get in touch with him within a few days with further recommendations.

## 2018-08-20 NOTE — Telephone Encounter (Signed)
Received call from [employer] reporting Zio was triggered by pt on 7/23 at 2:35 am for symptomatic bradycardia HR 39 for 30 sec.  Will forward to Dr. Radford Pax for her [facility]

## 2018-08-20 NOTE — Telephone Encounter (Signed)
Patient is returning call.  °

## 2018-08-20 NOTE — Telephone Encounter (Signed)
Please call patient to find out what symptoms he had at that time

## 2018-08-21 ENCOUNTER — Telehealth: Payer: Self-pay | Admitting: *Deleted

## 2018-08-21 NOTE — Telephone Encounter (Signed)
Spoke to the patient and informed him of Dr Theodosia Blender recommendations.  He verbalized understanding.

## 2018-08-21 NOTE — Telephone Encounter (Signed)
-----   Message from Frederik Schmidt, RN sent at 08/21/2018  8:22 AM EDT ----- Regarding: Sleep Study Schedule Please schedule sleep study Dr Radford Pax Bradycardia  Thank you.

## 2018-08-23 ENCOUNTER — Other Ambulatory Visit: Payer: Self-pay

## 2018-08-23 ENCOUNTER — Telehealth: Payer: Self-pay

## 2018-08-23 DIAGNOSIS — I471 Supraventricular tachycardia: Secondary | ICD-10-CM

## 2018-08-23 DIAGNOSIS — I495 Sick sinus syndrome: Secondary | ICD-10-CM

## 2018-08-23 NOTE — Telephone Encounter (Signed)
-----   Message from Sueanne Margarita, MD sent at 08/22/2018  5:57 PM EDT ----- Please let patient know that he has frequent runs of nonsustained atrial tachycardia but also bradycardia at night as low as 37bpm.  Amio has been stopped due to bradycardia but still has atrial tachycardia - please refer to EP for further evaluation.

## 2018-08-23 NOTE — Telephone Encounter (Signed)
No note needed 

## 2018-08-23 NOTE — Telephone Encounter (Signed)
Notes recorded by Frederik Schmidt, RN on 08/23/2018 at 4:03 PM EDT  The patient has been notified of the result and verbalized understanding. All questions (if any) were answered.  Frederik Schmidt, RN 08/23/2018 4:03 PM

## 2018-08-23 NOTE — Telephone Encounter (Signed)
Notes recorded by Frederik Schmidt, RN on 08/23/2018 at 9:41 AM EDT  [id number] 8/6  ------

## 2018-08-29 DIAGNOSIS — S7000XA Contusion of unspecified hip, initial encounter: Secondary | ICD-10-CM | POA: Diagnosis not present

## 2018-08-29 DIAGNOSIS — W19XXXA Unspecified fall, initial encounter: Secondary | ICD-10-CM | POA: Diagnosis not present

## 2018-08-29 DIAGNOSIS — I499 Cardiac arrhythmia, unspecified: Secondary | ICD-10-CM | POA: Diagnosis not present

## 2018-08-29 DIAGNOSIS — I10 Essential (primary) hypertension: Secondary | ICD-10-CM | POA: Diagnosis not present

## 2018-08-29 DIAGNOSIS — I4892 Unspecified atrial flutter: Secondary | ICD-10-CM | POA: Diagnosis not present

## 2018-09-05 DIAGNOSIS — I10 Essential (primary) hypertension: Secondary | ICD-10-CM | POA: Diagnosis not present

## 2018-09-05 DIAGNOSIS — N183 Chronic kidney disease, stage 3 (moderate): Secondary | ICD-10-CM | POA: Diagnosis not present

## 2018-09-05 DIAGNOSIS — N400 Enlarged prostate without lower urinary tract symptoms: Secondary | ICD-10-CM | POA: Diagnosis not present

## 2018-09-05 DIAGNOSIS — I119 Hypertensive heart disease without heart failure: Secondary | ICD-10-CM | POA: Diagnosis not present

## 2018-09-05 DIAGNOSIS — I4891 Unspecified atrial fibrillation: Secondary | ICD-10-CM | POA: Diagnosis not present

## 2018-09-05 DIAGNOSIS — M170 Bilateral primary osteoarthritis of knee: Secondary | ICD-10-CM | POA: Diagnosis not present

## 2018-09-05 DIAGNOSIS — I701 Atherosclerosis of renal artery: Secondary | ICD-10-CM | POA: Diagnosis not present

## 2018-09-05 DIAGNOSIS — I2510 Atherosclerotic heart disease of native coronary artery without angina pectoris: Secondary | ICD-10-CM | POA: Diagnosis not present

## 2018-09-05 DIAGNOSIS — E785 Hyperlipidemia, unspecified: Secondary | ICD-10-CM | POA: Diagnosis not present

## 2018-09-05 DIAGNOSIS — I63512 Cerebral infarction due to unspecified occlusion or stenosis of left middle cerebral artery: Secondary | ICD-10-CM | POA: Diagnosis not present

## 2018-09-14 ENCOUNTER — Other Ambulatory Visit: Payer: Self-pay

## 2018-09-14 ENCOUNTER — Ambulatory Visit: Payer: PPO | Admitting: Cardiology

## 2018-09-14 ENCOUNTER — Encounter: Payer: Self-pay | Admitting: Cardiology

## 2018-09-14 VITALS — BP 144/64 | HR 59 | Ht 73.0 in | Wt 188.0 lb

## 2018-09-14 DIAGNOSIS — R002 Palpitations: Secondary | ICD-10-CM | POA: Diagnosis not present

## 2018-09-14 NOTE — Progress Notes (Signed)
Electrophysiology Office Note   Date:  09/14/2018   ID:  Rizzo, [name] November 23, 1940, MRN [id number]  PCP:  Josetta Huddle, MD  Cardiologist:  Radford Pax Primary Electrophysiologist:  Toren Tucholski Meredith Leeds, MD    No chief complaint on file.    History of Present Illness: [name] is a 78 y.o. male who is being seen today for the evaluation of SVT at the request of Turner, Eber Hong, MD. Presenting today for electrophysiology evaluation.  He has history of coronary artery disease status post CABG, hypertension, hyperlipidemia, paroxysmal atrial fibrillation, carotid stenosis status post left CEA, AAA status post repair.  Today, he denies symptoms of chest pain, shortness of breath, orthopnea, PND, lower extremity edema, claudication, dizziness, presyncope, syncope, bleeding, or neurologic sequela. The patient is tolerating medications without difficulties.  He is currently feeling well.  He does get occasional palpitations that mainly occur at night.  He wakes up with an anxiety feeling in his chest.  He takes his pulse and feels a few irregular beats with an abnormal beat mixed in.  Aside from that he is doing well without major complaint.   Past Medical History:  Diagnosis Date  . Arthritis    HANDS AND FEET  . Asymptomatic carotid artery stenosis BILATERAL   PER DOPPLER  03/2016  RICA  1 - 39%/  s/p  L CEA  1-39%- followed by Dr. Donnetta Hutching  . CAD (coronary artery disease)    s/p remote stenting, s/p CABG with LIMA to LAD, SVG to RCA and SVG to LCx 12/2015  . Cataract immature BILATERAL   . Diverticulosis   . 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 History of diverticulosis    Noted on Colonoscopy  . History of inguinal herniorrhaphy   . Hydrocele, left   . Hyperlipidemia   . Hypertension   . Increased intraocular pressure   . Nocturia   . Paroxysmal atrial fibrillation ([place]) 06/30/14   chad2vasc score is at least 4  .  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 Retinal detachment   . Stroke [facility])    Past Surgical History:  Procedure Laterality Date  . AAA REPAIR W/ AORTOBIFEMORAL BYPASS  OCT  24401  . ABDOMINAL HERNIA REPAIR  X4  (LAST ONE 1990'S)   inguinal herniorrhaphy  . CARDIAC CATHETERIZATION N/A 12/16/2015   Procedure: Left Heart Cath and Coronary Angiography;  Surgeon: Wellington Hampshire, MD;  Location: [place] [facility];  Service: Cardiovascular;  Laterality: N/A;  . CAROTID ENDARTERECTOMY    . CARPAL TUNNEL RELEASE Right Oct. 2013   Hand  . CORONARY ANGIOPLASTY  2002   PTCA  W/ X2 BM STENTS--   PROXIMA LAD  &  LEFT CIRCUMFLEX TO FIRST OBTUSE MARGINAL BRANCH  . CORONARY ARTERY BYPASS GRAFT N/A 12/18/2015   Procedure: CORONARY ARTERY BYPASS GRAFTING (CABG) x 3, using left internal mammary artery and bilateral   leg greater saphenous vein harvested endoscopically  LIMA to LAD, SVG to Circumflex, SVG to RCA;  Surgeon: Grace Isaac, MD;  Location: [place];  Service: Open Heart Surgery;  Laterality: N/A;  . ENDARTERECTOMY Left 03/11/2015   Procedure: ENDARTERECTOMY LEFT CAROTID;  Surgeon: Rosetta Posner, MD;  Location: [place];  Service: Vascular;  Laterality: Left;  . EXPLORATORY LAPAROTOMY W/ ENTEROLYSIS FOR PARTIAL SBO  09-23-2008  . heart stents  02/19/00  . HYDROCELE EXCISION  01/24/2011   Procedure: HYDROCELECTOMY ADULT;  Surgeon: Shanon Brow  Cy Blamer, MD;  Location: [facility];  Service: Urology;  Laterality: Left;  . Ingrown Toe Nail Right Jan. 2015   Right Great Toe nail  . intest blockage  09/22/08  . PATCH ANGIOPLASTY Left 03/11/2015   Procedure: PATCH ANGIOPLASTY LEFT CAROTID USING HEMASHIELD PLATINUM FINESSE PATCH;  Surgeon: Rosetta Posner, MD;  Location: [place];  Service: Vascular;  Laterality: Left;  . PULLEY RELEASE -- RIGHT 5TH FINGER  2009  . RETINAL DETACHMENT SURGERY  2004   BILATERAL  . TEE WITHOUT CARDIOVERSION N/A 12/18/2015   Procedure: TRANSESOPHAGEAL ECHOCARDIOGRAM (TEE);   Surgeon: Grace Isaac, MD;  Location: [place];  Service: Open Heart Surgery;  Laterality: N/A;  . TOOTH EXTRACTION  Dec 2013     Current Outpatient Medications  Medication Sig Dispense Refill  . acetaminophen (TYLENOL) 500 MG tablet Take 2 tablets (1,000 mg total) by mouth every 6 (six) hours as needed. 30 tablet 0  . amLODipine (NORVASC) 5 MG tablet     . apixaban (ELIQUIS) 5 MG TABS tablet Take 1 tablet (5 mg total) by mouth 2 (two) times daily. 60 tablet 6  . b complex vitamins tablet Take 1 tablet by mouth daily.      . calcium carbonate (OS-CAL) 600 MG TABS Take 600 mg by mouth daily.      . Cholecalciferol 1000 UNITS capsule Take 1,000 Units by mouth daily.     . cloNIDine (CATAPRES) 0.1 MG tablet TAKE 1 TO 2 TABLETS BY MOUTH AT BEDTIME EVERY DAY  5  . Coenzyme Q10 (CO Q 10) 100 MG CAPS Take 1 capsule by mouth daily.    . cyanocobalamin 1000 MCG tablet Take 1,000 mcg by mouth daily.    [name] EPINEPHrine (EPIPEN) 0.3 mg/0.3 mL SOAJ injection Inject 0.3 mg into the muscle daily as needed (allergic reaction).     . finasteride (PROSCAR) 5 MG tablet Take 5 mg by mouth daily.    . fluorouracil (EFUDEX) 5 % cream APPLY NN THE SKIN TWICE A DAY TO AFFECTED AREAS TWICE A DAY X2 WEEKS AS DIRECTED  0  . hydrALAZINE (APRESOLINE) 25 MG tablet     . levofloxacin (LEVAQUIN) 500 MG tablet Take 500 mg by mouth daily.    [name] losartan (COZAAR) 50 MG tablet Take 1 tablet (50 mg total) by mouth daily. 90 tablet 3  . Magnesium Hydroxide (MAGNESIA PO) Take 1,000 mg by mouth daily.     . Multiple Vitamin (MULTIVITAMIN) tablet Take 1 tablet by mouth daily.      . Omega-3 Fatty Acids (FISH OIL PO) Take 1,200 mg by mouth daily.    . potassium gluconate 595 (99 K) MG TABS tablet Take 595 mg by mouth.    . rosuvastatin (CRESTOR) 40 MG tablet Take 1 tablet (40 mg total) by mouth every evening. 90 tablet 3  . Tamsulosin HCl (FLOMAX) 0.4 MG CAPS Take 0.8 mg by mouth daily.     . vitamin C (ASCORBIC ACID) 500 MG  tablet Take 2,000 mg by mouth daily.     Current Facility-Administered Medications  Medication Dose Route Frequency Provider Last Rate Last Dose  . triamcinolone acetonide (KENALOG) 10 MG/ML injection 10 mg  10 mg Other Once Wallene Huh, DPM        Allergies:   Wasp venom and Other   Social History:  The patient  reports that he quit smoking about 40 years ago. His smoking use included cigarettes. He quit after 30.00 years  of use. He quit smokeless tobacco use about 40 years ago. He reports current alcohol use of about 22.0 standard drinks of alcohol per week. He reports that he does not use drugs.   Family History:  The patient's family history includes Cancer (age of onset: 38) in his mother; Heart disease in his mother; Heart failure in his father; Hyperlipidemia in his mother.    ROS:  Please see the history of present illness.   Otherwise, review of systems is positive for none.   All other systems are reviewed and negative.    PHYSICAL EXAM: VS:  BP (!) 144/64   Pulse (!) 59   Ht 6\' 1"  (1.854 m)   Wt 188 lb (85.3 kg)   SpO2 99%   BMI 24.80 kg/m  , BMI Body mass index is 24.8 kg/m. GEN: Well nourished, well developed, in no acute distress  HEENT: normal  Neck: no JVD, carotid bruits, or masses Cardiac: RRR; no murmurs, rubs, or gallops,no edema  Respiratory:  clear to auscultation bilaterally, normal work of breathing GI: soft, nontender, nondistended, + BS MS: no deformity or atrophy  Skin: warm and dry Neuro:  Strength and sensation are intact Psych: euthymic mood, full affect  EKG:  EKG is ordered today. Personal review of the ekg ordered shows this rhythm, first-degree AV block, left anterior fascicular block, possible anterior MI, rate 59  Recent Labs: 11/08/2017: ALT 26 11/28/2017: BUN 23; Creatinine, Ser 1.53; Potassium 4.6; Sodium 132    Lipid Panel     Component Value Date/Time   CHOL 155 11/08/2017 1002   CHOL 164 05/24/2013 0916   TRIG 27  11/08/2017 1002   TRIG 50 05/24/2013 0916   HDL 90 11/08/2017 1002   HDL 84 05/24/2013 0916   CHOLHDL 1.7 11/08/2017 1002   CHOLHDL 2.4 12/16/2015 0218   VLDL 15 12/16/2015 0218   LDLCALC 60 11/08/2017 1002   LDLCALC 70 05/24/2013 0916     Wt Readings from Last 3 Encounters:  09/14/18 188 lb (85.3 kg)  11/15/17 194 lb 1.9 oz (88.1 kg)  11/08/17 194 lb 12.8 oz (88.4 kg)      Other studies Reviewed: Additional studies/ records that were reviewed today include: [place] 2017  Review of the above records today demonstrates:   The left ventricular systolic function is normal.  LV end diastolic pressure is normal.  The left ventricular ejection fraction is 55-65% by visual estimate.  There is no aortic valve stenosis.  Prox RCA lesion, 70 %stenosed.  LM lesion, 60 %stenosed.  Ost LM to LM lesion, 70 %stenosed.  Ost 1st Mrg to 1st Mrg lesion, 40 %stenosed.  Ost LAD to Prox LAD lesion, 30 %stenosed.  Ost 1st Diag to 1st Diag lesion, 20 %stenosed.  Cardiac monitor 08/22/18  Sinus bradycardia and normal sinus rhythm with average heart rate 54bpm. The heart rate ranged from 37-112bpm  PVCs and ventricular couplets.  Frequent runs of nonsustained atrial tachycardia up to 7 beats in a row.  Sinus bradycardia occurred during sleep.  ASSESSMENT AND PLAN:  1.  Coronary artery disease: Status post CABG.  No current angina.  Continue statin per primary cardiology.  2.  Persistent atrial fibrillation: Currently on Eliquis.  Amiodarone is been stopped due to bradycardia on telemetry.  He wore a cardiac monitor that showed some evidence of nocturnal bradycardia as well as a few short runs of possibly an atrial tachycardia.  I have reassured him that this is not overtly worrisome and that  we can continue to monitor.  No changes.  3.  Hypertension: Blood pressures are elevated today, but he brings in home blood pressures with systolics closer to [id number].  He has follow-up with Dr. Radford Pax in  October.  We Makeshia Seat make no further changes.  4.  Hyperlipidemia: Crestor per primary cardiology.  Case discussed with primary cardiology  Current medicines are reviewed at length with the patient today.   The patient does not have concerns regarding his medicines.  The following changes were made today:  none  Labs/ tests ordered today include:  Orders Placed This Encounter  Procedures  . EKG 12-Lead     Disposition:   FU with Arber Wiemers PRN  Signed, Muhamad Serano Meredith Leeds, MD  09/14/2018 11:22 AM     [facility] [address] [place] [address] [phone] (office) [phone] (fax)

## 2018-09-14 NOTE — Patient Instructions (Signed)
Medication Instructions:  Your physician recommends that you continue on your current medications as directed. Please refer to the Current Medication list given to you today.  * If you need a refill on your cardiac medications before your next appointment, please call your pharmacy.   Labwork: None ordered  Testing/Procedures: None ordered  Follow-Up: No follow up is needed at this time with Dr. Camnitz.  He will see you on an as needed basis.   Thank you for choosing [facility]!!   Marranda Arakelian, RN [phone]     

## 2018-09-25 ENCOUNTER — Telehealth: Payer: Self-pay | Admitting: *Deleted

## 2018-09-25 NOTE — Telephone Encounter (Signed)

## 2018-09-25 NOTE — Telephone Encounter (Signed)
Appointment 10/20 at 9:40.

## 2018-09-25 NOTE — Telephone Encounter (Signed)
-----   Message from Lauralee Evener, [place] sent at 09/21/2018  4:26 PM EDT ----- Regarding: RE: Sleep Study Schedule There's no office note. Patient needs OV. ----- Message ----- From: Frederik Schmidt, RN Sent: 08/21/2018   8:22 AM EDT To: Freada Bergeron, CMA, Cv Div Sleep Studies Subject: Sleep Study Schedule                           Please schedule sleep study Dr Radford Pax Bradycardia  Thank you.

## 2018-10-09 DIAGNOSIS — I2510 Atherosclerotic heart disease of native coronary artery without angina pectoris: Secondary | ICD-10-CM | POA: Diagnosis not present

## 2018-10-09 DIAGNOSIS — M159 Polyosteoarthritis, unspecified: Secondary | ICD-10-CM | POA: Diagnosis not present

## 2018-10-09 DIAGNOSIS — N400 Enlarged prostate without lower urinary tract symptoms: Secondary | ICD-10-CM | POA: Diagnosis not present

## 2018-10-09 DIAGNOSIS — I4891 Unspecified atrial fibrillation: Secondary | ICD-10-CM | POA: Diagnosis not present

## 2018-10-09 DIAGNOSIS — I63512 Cerebral infarction due to unspecified occlusion or stenosis of left middle cerebral artery: Secondary | ICD-10-CM | POA: Diagnosis not present

## 2018-10-09 DIAGNOSIS — N183 Chronic kidney disease, stage 3 (moderate): Secondary | ICD-10-CM | POA: Diagnosis not present

## 2018-10-09 DIAGNOSIS — E7800 Pure hypercholesterolemia, unspecified: Secondary | ICD-10-CM | POA: Diagnosis not present

## 2018-10-09 DIAGNOSIS — I10 Essential (primary) hypertension: Secondary | ICD-10-CM | POA: Diagnosis not present

## 2018-10-09 DIAGNOSIS — M170 Bilateral primary osteoarthritis of knee: Secondary | ICD-10-CM | POA: Diagnosis not present

## 2018-10-09 DIAGNOSIS — E785 Hyperlipidemia, unspecified: Secondary | ICD-10-CM | POA: Diagnosis not present

## 2018-10-09 DIAGNOSIS — I119 Hypertensive heart disease without heart failure: Secondary | ICD-10-CM | POA: Diagnosis not present

## 2018-10-09 DIAGNOSIS — I701 Atherosclerosis of renal artery: Secondary | ICD-10-CM | POA: Diagnosis not present

## 2018-10-12 ENCOUNTER — Other Ambulatory Visit: Payer: Self-pay

## 2018-10-12 ENCOUNTER — Encounter (INDEPENDENT_AMBULATORY_CARE_PROVIDER_SITE_OTHER): Payer: PPO | Admitting: Ophthalmology

## 2018-10-12 DIAGNOSIS — H43813 Vitreous degeneration, bilateral: Secondary | ICD-10-CM | POA: Diagnosis not present

## 2018-10-12 DIAGNOSIS — H338 Other retinal detachments: Secondary | ICD-10-CM

## 2018-10-12 DIAGNOSIS — I10 Essential (primary) hypertension: Secondary | ICD-10-CM

## 2018-10-12 DIAGNOSIS — H35033 Hypertensive retinopathy, bilateral: Secondary | ICD-10-CM | POA: Diagnosis not present

## 2018-10-12 DIAGNOSIS — H33302 Unspecified retinal break, left eye: Secondary | ICD-10-CM | POA: Diagnosis not present

## 2018-10-19 DIAGNOSIS — M25551 Pain in right hip: Secondary | ICD-10-CM | POA: Diagnosis not present

## 2018-10-19 DIAGNOSIS — M25552 Pain in left hip: Secondary | ICD-10-CM | POA: Diagnosis not present

## 2018-10-19 DIAGNOSIS — M160 Bilateral primary osteoarthritis of hip: Secondary | ICD-10-CM | POA: Diagnosis not present

## 2018-10-19 DIAGNOSIS — M1612 Unilateral primary osteoarthritis, left hip: Secondary | ICD-10-CM | POA: Insufficient documentation

## 2018-10-19 DIAGNOSIS — M169 Osteoarthritis of hip, unspecified: Secondary | ICD-10-CM | POA: Insufficient documentation

## 2018-10-31 DIAGNOSIS — G894 Chronic pain syndrome: Secondary | ICD-10-CM | POA: Diagnosis not present

## 2018-10-31 DIAGNOSIS — M159 Polyosteoarthritis, unspecified: Secondary | ICD-10-CM | POA: Diagnosis not present

## 2018-10-31 DIAGNOSIS — H9202 Otalgia, left ear: Secondary | ICD-10-CM | POA: Diagnosis not present

## 2018-11-01 DIAGNOSIS — M9901 Segmental and somatic dysfunction of cervical region: Secondary | ICD-10-CM | POA: Diagnosis not present

## 2018-11-01 DIAGNOSIS — C44321 Squamous cell carcinoma of skin of nose: Secondary | ICD-10-CM | POA: Diagnosis not present

## 2018-11-01 DIAGNOSIS — L570 Actinic keratosis: Secondary | ICD-10-CM | POA: Diagnosis not present

## 2018-11-01 DIAGNOSIS — M546 Pain in thoracic spine: Secondary | ICD-10-CM | POA: Diagnosis not present

## 2018-11-01 DIAGNOSIS — D485 Neoplasm of uncertain behavior of skin: Secondary | ICD-10-CM | POA: Diagnosis not present

## 2018-11-01 DIAGNOSIS — M542 Cervicalgia: Secondary | ICD-10-CM | POA: Diagnosis not present

## 2018-11-01 DIAGNOSIS — L739 Follicular disorder, unspecified: Secondary | ICD-10-CM | POA: Diagnosis not present

## 2018-11-01 DIAGNOSIS — M9902 Segmental and somatic dysfunction of thoracic region: Secondary | ICD-10-CM | POA: Diagnosis not present

## 2018-11-05 DIAGNOSIS — M546 Pain in thoracic spine: Secondary | ICD-10-CM | POA: Diagnosis not present

## 2018-11-05 DIAGNOSIS — M9901 Segmental and somatic dysfunction of cervical region: Secondary | ICD-10-CM | POA: Diagnosis not present

## 2018-11-05 DIAGNOSIS — M542 Cervicalgia: Secondary | ICD-10-CM | POA: Diagnosis not present

## 2018-11-05 DIAGNOSIS — M9902 Segmental and somatic dysfunction of thoracic region: Secondary | ICD-10-CM | POA: Diagnosis not present

## 2018-11-06 ENCOUNTER — Telehealth: Payer: PPO | Admitting: Cardiology

## 2018-11-07 ENCOUNTER — Ambulatory Visit: Payer: PPO | Admitting: Cardiology

## 2018-11-07 ENCOUNTER — Encounter: Payer: Self-pay | Admitting: Cardiology

## 2018-11-07 ENCOUNTER — Other Ambulatory Visit: Payer: Self-pay

## 2018-11-07 VITALS — BP 130/54 | HR 57 | Ht 73.0 in | Wt 188.4 lb

## 2018-11-07 DIAGNOSIS — R001 Bradycardia, unspecified: Secondary | ICD-10-CM

## 2018-11-07 DIAGNOSIS — M9902 Segmental and somatic dysfunction of thoracic region: Secondary | ICD-10-CM | POA: Diagnosis not present

## 2018-11-07 DIAGNOSIS — I10 Essential (primary) hypertension: Secondary | ICD-10-CM | POA: Diagnosis not present

## 2018-11-07 DIAGNOSIS — I2510 Atherosclerotic heart disease of native coronary artery without angina pectoris: Secondary | ICD-10-CM

## 2018-11-07 DIAGNOSIS — M546 Pain in thoracic spine: Secondary | ICD-10-CM | POA: Diagnosis not present

## 2018-11-07 DIAGNOSIS — M542 Cervicalgia: Secondary | ICD-10-CM | POA: Diagnosis not present

## 2018-11-07 DIAGNOSIS — I4819 Other persistent atrial fibrillation: Secondary | ICD-10-CM | POA: Diagnosis not present

## 2018-11-07 DIAGNOSIS — E7800 Pure hypercholesterolemia, unspecified: Secondary | ICD-10-CM

## 2018-11-07 DIAGNOSIS — M9901 Segmental and somatic dysfunction of cervical region: Secondary | ICD-10-CM | POA: Diagnosis not present

## 2018-11-07 DIAGNOSIS — I6521 Occlusion and stenosis of right carotid artery: Secondary | ICD-10-CM | POA: Diagnosis not present

## 2018-11-07 NOTE — Patient Instructions (Signed)
Medication Instructions:  Your physician has recommended you make the following change in your medication:  STOP Amiodarone (Pacerone)  *If you need a refill on your cardiac medications before your next appointment, please call your pharmacy*  Lab Work: None Ordered   Testing/Procedures: Your physician has recommended that you have a sleep study. This test records several body functions during sleep, including: brain activity, eye movement, oxygen and carbon dioxide blood levels, heart rate and rhythm, breathing rate and rhythm, the flow of air through your mouth and nose, snoring, body muscle movements, and chest and belly movement.    Follow-Up: At [facility], you and your health needs are our priority.  As part of our continuing mission to provide you with exceptional heart care, we have created designated Provider Care Teams.  These Care Teams include your primary Cardiologist (physician) and Advanced Practice Providers (APPs -  Physician Assistants and Nurse Practitioners) who all work together to provide you with the care you need, when you need it.  Your next appointment:   12 months  The format for your next appointment:   Either In Person or Virtual  Provider:   You may see Fransico Him, MD or one of the following Advanced Practice Providers on your designated Care Team:    Melina Copa, PA-C  Ermalinda Barrios, PA-C

## 2018-11-07 NOTE — Progress Notes (Signed)
Cardiology Office Note:    Date:  11/07/2018   ID:  BENEDICT BONEE, DOB [birth year], MRN [id number]  PCP:  Josetta Huddle, MD  Cardiologist:  Fransico Him, MD    Referring MD: Josetta Huddle, MD   Chief Complaint  Patient presents with  . Coronary Artery Disease  . Hyperlipidemia  . Hypertension    History of Present Illness:    [name] is a 78 y.o. male with a hx of CAD (s/p CABG with LIMA to LAD, SVG to RCA and SVG to LCx 12/2015), HTN, dyslipidemia,PAF on chronic anticoagulation with Eliquis, carotid artery stenosis s/p L CEA, AAA s/p repair and followed by Dr. Donnetta Hutching. He recently wore a heart monitor showing nocturnal bradycardia and nonsustained atrial tachycardia.  He was recently seen by Dr. Curt Bears and referred back to discuss possible sleep study. He says that the sleep study was set up but he did not proceed because he feels that he sleeps fine and does not need ti.    He is here today for followup and is doing well.  He denies any chest pain or pressure, SOB, DOE, PND, orthopnea, LE edema, dizziness, palpitations or syncope. He is compliant with his meds and is tolerating meds with no SE.    Past Medical History:  Diagnosis Date  . Arthritis    HANDS AND FEET  . Asymptomatic carotid artery stenosis BILATERAL   PER DOPPLER  03/2016  RICA  1 - 39%/  s/p  L CEA  1-39%- followed by Dr. Donnetta Hutching  . CAD (coronary artery disease)    s/p remote stenting, s/p CABG with LIMA to LAD, SVG to RCA and SVG to LCx 12/2015  . Cataract immature BILATERAL   . Diverticulosis   . 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 History of diverticulosis    Noted on Colonoscopy  . History of inguinal herniorrhaphy   . Hydrocele, left   . Hyperlipidemia   . Hypertension   . Increased intraocular pressure   . Nocturia   . Paroxysmal atrial fibrillation ([place]) 06/30/14   chad2vasc score is at least 4  . 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  Retinal detachment   . Stroke [facility])     Past Surgical History:  Procedure Laterality Date  . AAA REPAIR W/ AORTOBIFEMORAL BYPASS  OCT  24401  . ABDOMINAL HERNIA REPAIR  X4  (LAST ONE 1990'S)   inguinal herniorrhaphy  . CARDIAC CATHETERIZATION N/A 12/16/2015   Procedure: Left Heart Cath and Coronary Angiography;  Surgeon: Wellington Hampshire, MD;  Location: [place] [facility];  Service: Cardiovascular;  Laterality: N/A;  . CAROTID ENDARTERECTOMY    . CARPAL TUNNEL RELEASE Right Oct. 2013   Hand  . CORONARY ANGIOPLASTY  2002   PTCA  W/ X2 BM STENTS--   PROXIMA LAD  &  LEFT CIRCUMFLEX TO FIRST OBTUSE MARGINAL BRANCH  . CORONARY ARTERY BYPASS GRAFT N/A 12/18/2015   Procedure: CORONARY ARTERY BYPASS GRAFTING (CABG) x 3, using left internal mammary artery and bilateral   leg greater saphenous vein harvested endoscopically  LIMA to LAD, SVG to Circumflex, SVG to RCA;  Surgeon: Grace Isaac, MD;  Location: [place];  Service: Open Heart Surgery;  Laterality: N/A;  . ENDARTERECTOMY Left 03/11/2015   Procedure: ENDARTERECTOMY LEFT CAROTID;  Surgeon: Rosetta Posner, MD;  Location: [place];  Service: Vascular;  Laterality: Left;  . EXPLORATORY LAPAROTOMY W/ ENTEROLYSIS FOR PARTIAL SBO  09-23-2008  . heart stents  02/19/00  . HYDROCELE EXCISION  01/24/2011   Procedure: HYDROCELECTOMY ADULT;  Surgeon: Bernestine Amass, MD;  Location: [facility];  Service: Urology;  Laterality: Left;  . Ingrown Toe Nail Right Jan. 2015   Right Great Toe nail  . intest blockage  09/22/08  . PATCH ANGIOPLASTY Left 03/11/2015   Procedure: PATCH ANGIOPLASTY LEFT CAROTID USING HEMASHIELD PLATINUM FINESSE PATCH;  Surgeon: Rosetta Posner, MD;  Location: [place];  Service: Vascular;  Laterality: Left;  . PULLEY RELEASE -- RIGHT 5TH FINGER  2009  . RETINAL DETACHMENT SURGERY  2004   BILATERAL  . TEE WITHOUT CARDIOVERSION N/A 12/18/2015   Procedure: TRANSESOPHAGEAL ECHOCARDIOGRAM (TEE);  Surgeon: Grace Isaac, MD;   Location: [place];  Service: Open Heart Surgery;  Laterality: N/A;  . TOOTH EXTRACTION  Dec 2013    Current Medications: Current Meds  Medication Sig  . acetaminophen (TYLENOL) 500 MG tablet Take 2 tablets (1,000 mg total) by mouth every 6 (six) hours as needed.  [name] amiodarone (PACERONE) 100 MG tablet Take 100 mg by mouth daily.  [name] amLODipine (NORVASC) 5 MG tablet   . apixaban (ELIQUIS) 5 MG TABS tablet Take 1 tablet (5 mg total) by mouth 2 (two) times daily.  [name] Apoaequorin (PREVAGEN) 10 MG CAPS Take 10 mg by mouth daily.  [name] b complex vitamins tablet Take 1 tablet by mouth daily.    . calcium carbonate (OS-CAL) 600 MG TABS Take 600 mg by mouth daily.    . Cholecalciferol 1000 UNITS capsule Take 1,000 Units by mouth daily.   . cloNIDine (CATAPRES) 0.1 MG tablet TAKE 1 TO 2 TABLETS BY MOUTH AT BEDTIME EVERY DAY  . Coenzyme Q10 (CO Q 10) 100 MG CAPS Take 1 capsule by mouth daily.  . cyanocobalamin 1000 MCG tablet Take 1,000 mcg by mouth daily.  . finasteride (PROSCAR) 5 MG tablet Take 5 mg by mouth daily.  [name] losartan (COZAAR) 50 MG tablet Take 1 tablet (50 mg total) by mouth daily.  . Magnesium Hydroxide (MAGNESIA PO) Take 1,000 mg by mouth daily.   . Multiple Vitamin (MULTIVITAMIN) tablet Take 1 tablet by mouth daily.    . Omega-3 Fatty Acids (FISH OIL PO) Take 1,200 mg by mouth daily.  . potassium gluconate 595 (99 K) MG TABS tablet Take 595 mg by mouth.  . rosuvastatin (CRESTOR) 40 MG tablet Take 1 tablet (40 mg total) by mouth every evening.  . Tamsulosin HCl (FLOMAX) 0.4 MG CAPS Take 0.8 mg by mouth daily.   . vitamin C (ASCORBIC ACID) 500 MG tablet Take 2,000 mg by mouth daily.   Current Facility-Administered Medications for the 11/07/18 encounter (Office Visit) with Sueanne Margarita, MD  Medication  . triamcinolone acetonide (KENALOG) 10 MG/ML injection 10 mg     Allergies:   Wasp venom and Other   Social History   Socioeconomic History  . Marital status: Married    Spouse  name: Not on file  . Number of children: Not on file  . Years of education: Not on file  . Highest education level: Not on file  Occupational History  . Not on file  Social Needs  . Financial resource strain: Not on file  . Food insecurity    Worry: Not on file    Inability: Not on file  . Transportation needs    Medical: Not on file    Non-medical: Not on file  Tobacco Use  .  Smoking status: Former Smoker    Years: 30.00    Types: Cigarettes    Quit date: 01/18/1978    Years since quitting: 40.8  . Smokeless tobacco: Former Systems developer    Quit date: 1980  Substance and Sexual Activity  . Alcohol use: Yes    Alcohol/week: 22.0 standard drinks    Types: 1 Shots of liquor, 21 Standard drinks or equivalent per week    Comment: 2 oz of vodka per day  . Drug use: No  . Sexual activity: Not on file  Lifestyle  . Physical activity    Days per week: Not on file    Minutes per session: Not on file  . Stress: Not on file  Relationships  . Social Herbalist on phone: Not on file    Gets together: Not on file    Attends religious service: Not on file    Active member of club or organization: Not on file    Attends meetings of clubs or organizations: Not on file    Relationship status: Not on file  Other Topics Concern  . Not on file  Social History Narrative   Pt lives in [place] with spouse.  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     Family History: The patient's family history includes Cancer (age of onset: 42) in his mother; Heart disease in his mother; Heart failure in his father; Hyperlipidemia in his mother.  ROS:   Please see the history of present illness.    ROS  All other systems reviewed and negative.   EKGs/Labs/Other Studies Reviewed:    The following studies were reviewed today: none  EKG:  EKG is not ordered today.   Recent Labs: 11/08/2017: ALT 26 11/28/2017: BUN 23; Creatinine, Ser 1.53; Potassium 4.6; Sodium 132   Recent  Lipid Panel    Component Value Date/Time   CHOL 155 11/08/2017 1002   CHOL 164 05/24/2013 0916   TRIG 27 11/08/2017 1002   TRIG 50 05/24/2013 0916   HDL 90 11/08/2017 1002   HDL 84 05/24/2013 0916   CHOLHDL 1.7 11/08/2017 1002   CHOLHDL 2.4 12/16/2015 0218   VLDL 15 12/16/2015 0218   LDLCALC 60 11/08/2017 1002   LDLCALC 70 05/24/2013 0916    Physical Exam:    VS:  BP (!) 130/54   Pulse (!) 57   Ht 6\' 1"  (1.854 m)   Wt 188 lb 6.4 oz (85.5 kg)   SpO2 99%   BMI 24.86 kg/m     Wt Readings from Last 3 Encounters:  11/07/18 188 lb 6.4 oz (85.5 kg)  09/14/18 188 lb (85.3 kg)  11/15/17 194 lb 1.9 oz (88.1 kg)     GEN:  Well nourished, well developed in no acute distress HEENT: Normal NECK: No JVD; No carotid bruits LYMPHATICS: No lymphadenopathy CARDIAC: RRR, no murmurs, rubs, gallops RESPIRATORY:  Clear to auscultation without rales, wheezing or rhonchi  ABDOMEN: Soft, non-tender, non-distended MUSCULOSKELETAL:  No edema; No deformity  SKIN: Warm and dry NEUROLOGIC:  Alert and oriented x 3 PSYCHIATRIC:  Normal affect   ASSESSMENT:    1. Atherosclerosis of native coronary artery of native heart without angina pectoris   2. Pure hypercholesterolemia   3. Persistent atrial fibrillation ([place])   4. Essential hypertension   5. Carotid stenosis, right    PLAN:    In order of problems listed above:  1.  ASCAD -s/p CABG with LIMA to LAD, SVG to  RCA and SVG to LCx 12/2015 -denies any anginal sx -continue ASA and statin.  2.  HLD -LDL goal < 70 -LDL was 60 in June -continue Crestor 40mg  daily  3.  Persistent atrial fibrillation -maintaining NSR on exam -no BB due to bradycardia -Amio was stopped in August due to bradycardia but he never stopped it. -his HRs at home have been in the 40's during the day and today 50's. -I instructed him to stop Amio -no bleeding issues on DOAC -continue Eliquis 5mg  BID -creatinine 1.37 and Hbg 12.5 in June  4.  HTN -BP fairly  well controlled at home.  He brought in a list of BP readings with a few outlying mildly elevated readings mainly in the am before his am meds -continue amlodipine 5mg  daily, Clonidine 0.1mg  qhs,  Losartan 50mg  daily.  5.  Right carotid artery stenosis -dopplers 2018 with 1-39% right carotid stenosis -followed by Dr. Donnetta Hutching -continue ASA and statin  6.  Nocturnal Bradycardia -given his significant bradycardia and atrial tachy at night as well as PAF and CAD, recommend split night sleep study for evaluation of OSA.   Medication Adjustments/Labs and Tests Ordered: Current medicines are reviewed at length with the patient today.  Concerns regarding medicines are outlined above.  No orders of the defined types were placed in this encounter.  No orders of the defined types were placed in this encounter.   Signed, Fransico Him, MD  11/07/2018 9:48 AM    [place]

## 2018-11-08 DIAGNOSIS — M546 Pain in thoracic spine: Secondary | ICD-10-CM | POA: Diagnosis not present

## 2018-11-08 DIAGNOSIS — M542 Cervicalgia: Secondary | ICD-10-CM | POA: Diagnosis not present

## 2018-11-08 DIAGNOSIS — M9901 Segmental and somatic dysfunction of cervical region: Secondary | ICD-10-CM | POA: Diagnosis not present

## 2018-11-08 DIAGNOSIS — M9902 Segmental and somatic dysfunction of thoracic region: Secondary | ICD-10-CM | POA: Diagnosis not present

## 2018-11-12 DIAGNOSIS — M9901 Segmental and somatic dysfunction of cervical region: Secondary | ICD-10-CM | POA: Diagnosis not present

## 2018-11-12 DIAGNOSIS — M542 Cervicalgia: Secondary | ICD-10-CM | POA: Diagnosis not present

## 2018-11-12 DIAGNOSIS — M546 Pain in thoracic spine: Secondary | ICD-10-CM | POA: Diagnosis not present

## 2018-11-12 DIAGNOSIS — M9902 Segmental and somatic dysfunction of thoracic region: Secondary | ICD-10-CM | POA: Diagnosis not present

## 2018-11-13 DIAGNOSIS — H1849 Other corneal degeneration: Secondary | ICD-10-CM | POA: Diagnosis not present

## 2018-11-13 DIAGNOSIS — M1612 Unilateral primary osteoarthritis, left hip: Secondary | ICD-10-CM | POA: Diagnosis not present

## 2018-11-13 DIAGNOSIS — E7800 Pure hypercholesterolemia, unspecified: Secondary | ICD-10-CM | POA: Diagnosis not present

## 2018-11-13 DIAGNOSIS — I119 Hypertensive heart disease without heart failure: Secondary | ICD-10-CM | POA: Diagnosis not present

## 2018-11-13 DIAGNOSIS — I701 Atherosclerosis of renal artery: Secondary | ICD-10-CM | POA: Diagnosis not present

## 2018-11-13 DIAGNOSIS — H18503 Unspecified hereditary corneal dystrophies, bilateral: Secondary | ICD-10-CM | POA: Diagnosis not present

## 2018-11-13 DIAGNOSIS — Z961 Presence of intraocular lens: Secondary | ICD-10-CM | POA: Diagnosis not present

## 2018-11-13 DIAGNOSIS — H52223 Regular astigmatism, bilateral: Secondary | ICD-10-CM | POA: Diagnosis not present

## 2018-11-13 DIAGNOSIS — I10 Essential (primary) hypertension: Secondary | ICD-10-CM | POA: Diagnosis not present

## 2018-11-13 DIAGNOSIS — H59811 Chorioretinal scars after surgery for detachment, right eye: Secondary | ICD-10-CM | POA: Diagnosis not present

## 2018-11-13 DIAGNOSIS — M170 Bilateral primary osteoarthritis of knee: Secondary | ICD-10-CM | POA: Diagnosis not present

## 2018-11-13 DIAGNOSIS — N400 Enlarged prostate without lower urinary tract symptoms: Secondary | ICD-10-CM | POA: Diagnosis not present

## 2018-11-13 DIAGNOSIS — I2510 Atherosclerotic heart disease of native coronary artery without angina pectoris: Secondary | ICD-10-CM | POA: Diagnosis not present

## 2018-11-13 DIAGNOSIS — I63512 Cerebral infarction due to unspecified occlusion or stenosis of left middle cerebral artery: Secondary | ICD-10-CM | POA: Diagnosis not present

## 2018-11-13 DIAGNOSIS — H5203 Hypermetropia, bilateral: Secondary | ICD-10-CM | POA: Diagnosis not present

## 2018-11-13 DIAGNOSIS — E785 Hyperlipidemia, unspecified: Secondary | ICD-10-CM | POA: Diagnosis not present

## 2018-11-13 DIAGNOSIS — N1831 Chronic kidney disease, stage 3a: Secondary | ICD-10-CM | POA: Diagnosis not present

## 2018-11-13 DIAGNOSIS — M159 Polyosteoarthritis, unspecified: Secondary | ICD-10-CM | POA: Diagnosis not present

## 2018-11-13 DIAGNOSIS — H524 Presbyopia: Secondary | ICD-10-CM | POA: Diagnosis not present

## 2018-11-13 DIAGNOSIS — I4891 Unspecified atrial fibrillation: Secondary | ICD-10-CM | POA: Diagnosis not present

## 2018-11-14 DIAGNOSIS — M9902 Segmental and somatic dysfunction of thoracic region: Secondary | ICD-10-CM | POA: Diagnosis not present

## 2018-11-14 DIAGNOSIS — M546 Pain in thoracic spine: Secondary | ICD-10-CM | POA: Diagnosis not present

## 2018-11-14 DIAGNOSIS — M9901 Segmental and somatic dysfunction of cervical region: Secondary | ICD-10-CM | POA: Diagnosis not present

## 2018-11-14 DIAGNOSIS — M542 Cervicalgia: Secondary | ICD-10-CM | POA: Diagnosis not present

## 2018-11-23 ENCOUNTER — Telehealth: Payer: Self-pay | Admitting: Cardiology

## 2018-11-23 DIAGNOSIS — M25552 Pain in left hip: Secondary | ICD-10-CM | POA: Diagnosis not present

## 2018-11-23 DIAGNOSIS — M25551 Pain in right hip: Secondary | ICD-10-CM | POA: Diagnosis not present

## 2018-11-23 NOTE — Telephone Encounter (Signed)
Patient calling because he states he is supposed to be scheduled for a sleep study.

## 2018-11-27 DIAGNOSIS — M1612 Unilateral primary osteoarthritis, left hip: Secondary | ICD-10-CM | POA: Diagnosis not present

## 2018-12-04 DIAGNOSIS — N1831 Chronic kidney disease, stage 3a: Secondary | ICD-10-CM | POA: Diagnosis not present

## 2018-12-04 DIAGNOSIS — M25552 Pain in left hip: Secondary | ICD-10-CM | POA: Diagnosis not present

## 2018-12-04 DIAGNOSIS — I4891 Unspecified atrial fibrillation: Secondary | ICD-10-CM | POA: Diagnosis not present

## 2018-12-04 DIAGNOSIS — I63512 Cerebral infarction due to unspecified occlusion or stenosis of left middle cerebral artery: Secondary | ICD-10-CM | POA: Diagnosis not present

## 2018-12-04 DIAGNOSIS — M170 Bilateral primary osteoarthritis of knee: Secondary | ICD-10-CM | POA: Diagnosis not present

## 2018-12-04 DIAGNOSIS — N400 Enlarged prostate without lower urinary tract symptoms: Secondary | ICD-10-CM | POA: Diagnosis not present

## 2018-12-04 DIAGNOSIS — E785 Hyperlipidemia, unspecified: Secondary | ICD-10-CM | POA: Diagnosis not present

## 2018-12-04 DIAGNOSIS — I10 Essential (primary) hypertension: Secondary | ICD-10-CM | POA: Diagnosis not present

## 2018-12-04 DIAGNOSIS — M159 Polyosteoarthritis, unspecified: Secondary | ICD-10-CM | POA: Diagnosis not present

## 2018-12-04 DIAGNOSIS — M25551 Pain in right hip: Secondary | ICD-10-CM | POA: Diagnosis not present

## 2018-12-04 DIAGNOSIS — I701 Atherosclerosis of renal artery: Secondary | ICD-10-CM | POA: Diagnosis not present

## 2018-12-04 DIAGNOSIS — I2510 Atherosclerotic heart disease of native coronary artery without angina pectoris: Secondary | ICD-10-CM | POA: Diagnosis not present

## 2018-12-04 DIAGNOSIS — I119 Hypertensive heart disease without heart failure: Secondary | ICD-10-CM | POA: Diagnosis not present

## 2018-12-04 DIAGNOSIS — E7800 Pure hypercholesterolemia, unspecified: Secondary | ICD-10-CM | POA: Diagnosis not present

## 2018-12-05 DIAGNOSIS — C44321 Squamous cell carcinoma of skin of nose: Secondary | ICD-10-CM | POA: Diagnosis not present

## 2018-12-05 DIAGNOSIS — Z85828 Personal history of other malignant neoplasm of skin: Secondary | ICD-10-CM | POA: Diagnosis not present

## 2018-12-05 DIAGNOSIS — L570 Actinic keratosis: Secondary | ICD-10-CM | POA: Diagnosis not present

## 2018-12-07 DIAGNOSIS — M25551 Pain in right hip: Secondary | ICD-10-CM | POA: Diagnosis not present

## 2018-12-07 DIAGNOSIS — M25552 Pain in left hip: Secondary | ICD-10-CM | POA: Diagnosis not present

## 2018-12-11 DIAGNOSIS — M25552 Pain in left hip: Secondary | ICD-10-CM | POA: Diagnosis not present

## 2018-12-11 DIAGNOSIS — M25551 Pain in right hip: Secondary | ICD-10-CM | POA: Diagnosis not present

## 2018-12-18 ENCOUNTER — Telehealth: Payer: Self-pay

## 2018-12-18 NOTE — Telephone Encounter (Signed)
Spoke with patient and he refused to change office visit to virtual visit.

## 2018-12-19 ENCOUNTER — Ambulatory Visit: Payer: PPO | Admitting: Podiatry

## 2018-12-19 ENCOUNTER — Other Ambulatory Visit: Payer: Self-pay

## 2018-12-19 ENCOUNTER — Encounter: Payer: Self-pay | Admitting: Cardiology

## 2018-12-19 ENCOUNTER — Telehealth: Payer: Self-pay | Admitting: *Deleted

## 2018-12-19 ENCOUNTER — Ambulatory Visit: Payer: PPO | Admitting: Cardiology

## 2018-12-19 VITALS — BP 140/72 | HR 73 | Ht 73.0 in | Wt 182.0 lb

## 2018-12-19 DIAGNOSIS — E7800 Pure hypercholesterolemia, unspecified: Secondary | ICD-10-CM

## 2018-12-19 DIAGNOSIS — I10 Essential (primary) hypertension: Secondary | ICD-10-CM | POA: Diagnosis not present

## 2018-12-19 DIAGNOSIS — I6521 Occlusion and stenosis of right carotid artery: Secondary | ICD-10-CM | POA: Diagnosis not present

## 2018-12-19 DIAGNOSIS — R001 Bradycardia, unspecified: Secondary | ICD-10-CM | POA: Diagnosis not present

## 2018-12-19 DIAGNOSIS — I4819 Other persistent atrial fibrillation: Secondary | ICD-10-CM

## 2018-12-19 DIAGNOSIS — I2510 Atherosclerotic heart disease of native coronary artery without angina pectoris: Secondary | ICD-10-CM

## 2018-12-19 NOTE — Telephone Encounter (Signed)
Submitted PA for in lab sleep study to [facility] via web portal.

## 2018-12-19 NOTE — Patient Instructions (Signed)
Medication Instructions:  Your physician recommends that you continue on your current medications as directed. Please refer to the Current Medication list given to you today.  *If you need a refill on your cardiac medications before your next appointment, please call your pharmacy*  Lab Work: TODAY: CBC and BMET  If you have labs (blood work) drawn today and your tests are completely normal, you will receive your results only by: [name] MyChart Message (if you have MyChart) OR . A paper copy in the mail If you have any lab test that is abnormal or we need to change your treatment, we will call you to review the results.   Follow-Up: At [facility], you and your health needs are our priority.  As part of our continuing mission to provide you with exceptional heart care, we have created designated Provider Care Teams.  These Care Teams include your primary Cardiologist (physician) and Advanced Practice Providers (APPs -  Physician Assistants and Nurse Practitioners) who all work together to provide you with the care you need, when you need it.  Your next appointment:   3 month(s)  The format for your next appointment:   Virtual Visit   Provider:   Fransico Him, MD  Other Instructions You have been referred to the [facility].

## 2018-12-19 NOTE — Progress Notes (Signed)
Cardiology Office Note:    Date:  12/19/2018   ID:  [name], DOB [birth year], MRN [id number]  PCP:  Josetta Huddle, MD  Cardiologist:  Fransico Him, MD    Referring MD: Josetta Huddle, MD   Chief Complaint  Patient presents with  . Atrial Fibrillation  . Hypertension  . Coronary Artery Disease  . Hyperlipidemia    History of Present Illness:    [name] is a 78 y.o. male with a hx of CAD (s/p CABG with LIMA to LAD, SVG to RCA and SVG to LCx 12/2015), HTN, dyslipidemia,PAF on chronic anticoagulation with Eliquis, carotid artery stenosis s/p L CEA, AAA s/p repair and followed by Dr. Donnetta Hutching. He has a hx of nocturnal bradycardia and nonsustained atrial tachycardia and sleep study was recommended by  Dr. Curt Bears but he did not proceed because he feels that he sleeps fine and did not need it. After talking with him, a split night was ordered but his insurance would not approve without an OV. He is here today and thinks he is back in afib.  He has felt poorly since Sat when he tried to find his pulse and noticed it was very irregular. He denies any palpitations, CP, SOB, PND, orthopena, LE edema, dizziness or syncope.  He does fell bad though with fatigue and weakness especially with exertion since Saturday.   Past Medical History:  Diagnosis Date  . Arthritis    HANDS AND FEET  . Asymptomatic carotid artery stenosis BILATERAL   PER DOPPLER  03/2016  RICA  1 - 39%/  s/p  L CEA  1-39%- followed by Dr. Donnetta Hutching  . CAD (coronary artery disease)    s/p remote stenting, s/p CABG with LIMA to LAD, SVG to RCA and SVG to LCx 12/2015  . Cataract immature BILATERAL   . Diverticulosis   . 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 History of diverticulosis    Noted on Colonoscopy  . History of inguinal herniorrhaphy   . Hydrocele, left   . Hyperlipidemia   . Hypertension   . Increased intraocular pressure   . Nocturia   . Paroxysmal  atrial fibrillation ([place]) 06/30/14   chad2vasc score is at least 4  . 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 Retinal detachment   . Stroke [facility])     Past Surgical History:  Procedure Laterality Date  . AAA REPAIR W/ AORTOBIFEMORAL BYPASS  OCT  96295  . ABDOMINAL HERNIA REPAIR  X4  (LAST ONE 1990'S)   inguinal herniorrhaphy  . CARDIAC CATHETERIZATION N/A 12/16/2015   Procedure: Left Heart Cath and Coronary Angiography;  Surgeon: Wellington Hampshire, MD;  Location: [facility] [place] [facility];  Service: Cardiovascular;  Laterality: N/A;  . CAROTID ENDARTERECTOMY    . CARPAL TUNNEL RELEASE Right Oct. 2013   Hand  . CORONARY ANGIOPLASTY  2002   PTCA  W/ X2 BM STENTS--   PROXIMA LAD  &  LEFT CIRCUMFLEX TO FIRST OBTUSE MARGINAL BRANCH  . CORONARY ARTERY BYPASS GRAFT N/A 12/18/2015   Procedure: CORONARY ARTERY BYPASS GRAFTING (CABG) x 3, using left internal mammary artery and bilateral   leg greater saphenous vein harvested endoscopically  LIMA to LAD, SVG to Circumflex, SVG to RCA;  Surgeon: Grace Isaac, MD;  Location: [place];  Service: Open Heart Surgery;  Laterality: N/A;  . ENDARTERECTOMY Left 03/11/2015   Procedure: ENDARTERECTOMY LEFT CAROTID;  Surgeon: Rosetta Posner, MD;  Location: [facility] OR;  Service: Vascular;  Laterality: Left;  . EXPLORATORY LAPAROTOMY W/ ENTEROLYSIS FOR PARTIAL SBO  09-23-2008  . heart stents  02/19/00  . HYDROCELE EXCISION  01/24/2011   Procedure: HYDROCELECTOMY ADULT;  Surgeon: Bernestine Amass, MD;  Location: [facility];  Service: Urology;  Laterality: Left;  . Ingrown Toe Nail Right Jan. 2015   Right Great Toe nail  . intest blockage  09/22/08  . PATCH ANGIOPLASTY Left 03/11/2015   Procedure: PATCH ANGIOPLASTY LEFT CAROTID USING HEMASHIELD PLATINUM FINESSE PATCH;  Surgeon: Rosetta Posner, MD;  Location: [place];  Service: Vascular;  Laterality: Left;  . PULLEY RELEASE -- RIGHT 5TH FINGER  2009  . RETINAL DETACHMENT SURGERY  2004   BILATERAL  . TEE WITHOUT  CARDIOVERSION N/A 12/18/2015   Procedure: TRANSESOPHAGEAL ECHOCARDIOGRAM (TEE);  Surgeon: Grace Isaac, MD;  Location: [place];  Service: Open Heart Surgery;  Laterality: N/A;  . TOOTH EXTRACTION  Dec 2013    Current Medications: Current Meds  Medication Sig  . acetaminophen (TYLENOL) 500 MG tablet Take 2 tablets (1,000 mg total) by mouth every 6 (six) hours as needed.  [name] amLODipine (NORVASC) 5 MG tablet   . apixaban (ELIQUIS) 5 MG TABS tablet Take 1 tablet (5 mg total) by mouth 2 (two) times daily.  [name] Apoaequorin (PREVAGEN) 10 MG CAPS Take 10 mg by mouth daily.  [name] b complex vitamins tablet Take 1 tablet by mouth daily.    . calcium carbonate (OS-CAL) 600 MG TABS Take 600 mg by mouth daily.    . Cholecalciferol 1000 UNITS capsule Take 1,000 Units by mouth daily.   . cloNIDine (CATAPRES) 0.1 MG tablet TAKE 1 TO 2 TABLETS BY MOUTH AT BEDTIME EVERY DAY  . Coenzyme Q10 (CO Q 10) 100 MG CAPS Take 1 capsule by mouth daily.  . cyanocobalamin 1000 MCG tablet Take 1,000 mcg by mouth daily.  [name] EPINEPHrine (EPIPEN) 0.3 mg/0.3 mL SOAJ injection Inject 0.3 mg into the muscle daily as needed (allergic reaction).   . finasteride (PROSCAR) 5 MG tablet Take 5 mg by mouth daily.  . Magnesium Hydroxide (MAGNESIA PO) Take 1,000 mg by mouth daily.   . Multiple Vitamin (MULTIVITAMIN) tablet Take 1 tablet by mouth daily.    . Omega-3 Fatty Acids (FISH OIL PO) Take 1,200 mg by mouth daily.  [name] OVER THE COUNTER MEDICATION Take 0.5 Applicatorfuls by mouth daily. CBD oil  . potassium gluconate 595 (99 K) MG TABS tablet Take 595 mg by mouth.  . rosuvastatin (CRESTOR) 40 MG tablet Take 1 tablet (40 mg total) by mouth every evening.  . Tamsulosin HCl (FLOMAX) 0.4 MG CAPS Take 0.8 mg by mouth daily.   . vitamin C (ASCORBIC ACID) 500 MG tablet Take 2,000 mg by mouth daily.   Current Facility-Administered Medications for the 12/19/18 encounter (Office Visit) with Sueanne Margarita, MD  Medication  . triamcinolone  acetonide (KENALOG) 10 MG/ML injection 10 mg     Allergies:   Wasp venom and Other   Social History   Socioeconomic History  . Marital status: Married    Spouse name: Not on file  . Number of children: Not on file  . Years of education: Not on file  . Highest education level: Not on file  Occupational History  . Not on file  Social Needs  . Financial resource strain: Not on file  . Food insecurity    Worry: Not on file    Inability: Not  on file  . Transportation needs    Medical: Not on file    Non-medical: Not on file  Tobacco Use  . Smoking status: Former Smoker    Years: 30.00    Types: Cigarettes    Quit date: 01/18/1978    Years since quitting: 40.9  . Smokeless tobacco: Former Systems developer    Quit date: 1980  Substance and Sexual Activity  . Alcohol use: Yes    Alcohol/week: 22.0 standard drinks    Types: 1 Shots of liquor, 21 Standard drinks or equivalent per week    Comment: 2 oz of vodka per day  . Drug use: No  . Sexual activity: Not on file  Lifestyle  . Physical activity    Days per week: Not on file    Minutes per session: Not on file  . Stress: Not on file  Relationships  . Social Herbalist on phone: Not on file    Gets together: Not on file    Attends religious service: Not on file    Active member of club or organization: Not on file    Attends meetings of clubs or organizations: Not on file    Relationship status: Not on file  Other Topics Concern  . Not on file  Social History Narrative   Pt lives in [place] with spouse.  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     Family History: The patient's family history includes Cancer (age of onset: 76) in his mother; Heart disease in his mother; Heart failure in his father; Hyperlipidemia in his mother.  ROS:   Please see the history of present illness.    ROS  All other systems reviewed and negative.   EKGs/Labs/Other Studies Reviewed:    The following studies were  reviewed today: none  EKG:  EKG is  ordered today.  The ekg ordered today demonstrates atrial fibrillation with CVR, LAFB, septal infarct  Recent Labs: No results found for requested labs within last 8760 hours.   Recent Lipid Panel    Component Value Date/Time   CHOL 155 11/08/2017 1002   CHOL 164 05/24/2013 0916   TRIG 27 11/08/2017 1002   TRIG 50 05/24/2013 0916   HDL 90 11/08/2017 1002   HDL 84 05/24/2013 0916   CHOLHDL 1.7 11/08/2017 1002   CHOLHDL 2.4 12/16/2015 0218   VLDL 15 12/16/2015 0218   LDLCALC 60 11/08/2017 1002   LDLCALC 70 05/24/2013 0916    Physical Exam:    VS:  BP 140/72   Pulse 73   Ht 6\' 1"  (1.854 m)   Wt 182 lb (82.6 kg)   SpO2 99%   BMI 24.01 kg/m     Wt Readings from Last 3 Encounters:  12/19/18 182 lb (82.6 kg)  11/07/18 188 lb 6.4 oz (85.5 kg)  09/14/18 188 lb (85.3 kg)     GEN:  Well nourished, well developed in no acute distress HEENT: Normal NECK: No JVD; No carotid bruits LYMPHATICS: No lymphadenopathy CARDIAC: irregularly irregular, no murmurs, rubs, gallops RESPIRATORY:  Clear to auscultation without rales, wheezing or rhonchi  ABDOMEN: Soft, non-tender, non-distended MUSCULOSKELETAL:  No edema; No deformity  SKIN: Warm and dry NEUROLOGIC:  Alert and oriented x 3 PSYCHIATRIC:  Normal affect   ASSESSMENT:    1. Atherosclerosis of native coronary artery of native heart without angina pectoris   2. Pure hypercholesterolemia   3. Persistent atrial fibrillation ([place])   4. Essential hypertension  5. Stenosis of right carotid artery   6. Symptomatic sinus bradycardia    PLAN:    In order of problems listed above:  1.  ASCAD -s/p CABG with LIMA to LAD, SVG to RCA and SVG to LCx 12/2015 -he has not had any angina since I saw him last  -continue ASA and statin.  2.  HLD -LDL goal < 70 -LDL was 60 in June -continue Crestor 40mg  daily  3.  Persistent atrial fibrillation -he is back in afib rate controlled -he is  symptomatic with weakness and fatigue -no BB due to bradycardia when in NSR -he has not had any bleeding problems on his DOAC -continue Eliquis 5mg  BID -check BMET and CBC -will refer to afib clinic as he cannot take Amio due to bradycardia -? tikosyn load prior to DCCV  4.  HTN -BP controlled   -continue amlodipine 5mg  daily, Clonidine 0.1mg  qhs,  Losartan 50mg  daily.  5.  Right carotid artery stenosis -dopplers 2018 with 1-39% right carotid stenosis -followed by Dr. Donnetta Hutching -continue ASA and statin  6.  Nocturnal Bradycardia -he had a sleep study ordered but insurance would not approve without an OV -I stressed the importance of diagnosing any OSA and treating it to reduce afib events -will get split night sleep stud done ASAP   Medication Adjustments/Labs and Tests Ordered: Current medicines are reviewed at length with the patient today.  Concerns regarding medicines are outlined above.  Orders Placed This Encounter  Procedures  . CBC  . Basic Metabolic Panel (BMET)  . Amb Referral to AFIB Clinic  . EKG 12-Lead   No orders of the defined types were placed in this encounter.   Signed, Fransico Him, MD  12/19/2018 11:50 AM    [place]

## 2018-12-20 ENCOUNTER — Telehealth: Payer: Self-pay

## 2018-12-20 LAB — BASIC METABOLIC PANEL
BUN/Creatinine Ratio: 22 (ref 10–24)
BUN: 28 mg/dL — ABNORMAL HIGH (ref 8–27)
CO2: 25 mmol/L (ref 20–29)
Calcium: 10 mg/dL (ref 8.6–10.2)
Chloride: 102 mmol/L (ref 96–106)
Creatinine, Ser: 1.29 mg/dL — ABNORMAL HIGH (ref 0.76–1.27)
GFR calc Af Amer: 60 mL/min/{1.73_m2} (ref >59)
GFR calc non Af Amer: 52 mL/min/{1.73_m2} — ABNORMAL LOW (ref >59)
Glucose: 83 mg/dL (ref 65–99)
Potassium: 4.4 mmol/L (ref 3.5–5.2)
Sodium: 140 mmol/L (ref 134–144)

## 2018-12-20 LAB — CBC
Hematocrit: 38 % (ref 37.5–51.0)
Hemoglobin: 13.1 g/dL (ref 13.0–17.7)
MCH: 33.5 pg — ABNORMAL HIGH (ref 26.6–33.0)
MCHC: 34.5 g/dL (ref 31.5–35.7)
MCV: 97 fL (ref 79–97)
Platelets: 185 10*3/uL (ref 150–450)
RBC: 3.91 x10E6/uL — ABNORMAL LOW (ref 4.14–5.80)
RDW: 12.4 % (ref 11.6–15.4)
WBC: 5.4 10*3/uL (ref 3.4–10.8)

## 2018-12-20 NOTE — Telephone Encounter (Signed)
Notes recorded by Frederik Schmidt, RN on 12/20/2018 at 9:56 AM EST  The patient has been notified of the result and verbalized understanding. All questions (if any) were answered.  Frederik Schmidt, RN 12/20/2018 9:56 AM

## 2018-12-20 NOTE — Telephone Encounter (Signed)
-----   Message from Sueanne Margarita, MD sent at 12/20/2018  7:48 AM EST ----- Stable labs - continue current meds and forward to PCP

## 2018-12-21 ENCOUNTER — Encounter (HOSPITAL_COMMUNITY): Payer: Self-pay | Admitting: Physician Assistant

## 2018-12-21 ENCOUNTER — Other Ambulatory Visit: Payer: Self-pay

## 2018-12-21 ENCOUNTER — Ambulatory Visit (HOSPITAL_COMMUNITY)
Admission: RE | Admit: 2018-12-21 | Discharge: 2018-12-21 | Disposition: A | Discharge status: Home / Self Care | Payer: PPO | Source: Ambulatory Visit | Attending: Physician Assistant | Admitting: Physician Assistant

## 2018-12-21 VITALS — BP 152/66 | HR 63 | Ht 73.0 in | Wt 183.0 lb

## 2018-12-21 DIAGNOSIS — I4819 Other persistent atrial fibrillation: Secondary | ICD-10-CM | POA: Insufficient documentation

## 2018-12-21 DIAGNOSIS — Z8673 Personal history of transient ischemic attack (TIA), and cerebral infarction without residual deficits: Secondary | ICD-10-CM | POA: Insufficient documentation

## 2018-12-21 DIAGNOSIS — Z87891 Personal history of nicotine dependence: Secondary | ICD-10-CM | POA: Diagnosis not present

## 2018-12-21 DIAGNOSIS — Z01818 Encounter for other preprocedural examination: Secondary | ICD-10-CM | POA: Diagnosis not present

## 2018-12-21 DIAGNOSIS — Z20828 Contact with and (suspected) exposure to other viral communicable diseases: Secondary | ICD-10-CM | POA: Diagnosis not present

## 2018-12-21 DIAGNOSIS — Z7901 Long term (current) use of anticoagulants: Secondary | ICD-10-CM | POA: Insufficient documentation

## 2018-12-21 DIAGNOSIS — Z8679 Personal history of other diseases of the circulatory system: Secondary | ICD-10-CM | POA: Diagnosis not present

## 2018-12-21 DIAGNOSIS — D6869 Other thrombophilia: Secondary | ICD-10-CM | POA: Diagnosis not present

## 2018-12-21 DIAGNOSIS — I2510 Atherosclerotic heart disease of native coronary artery without angina pectoris: Secondary | ICD-10-CM | POA: Insufficient documentation

## 2018-12-21 DIAGNOSIS — I10 Essential (primary) hypertension: Secondary | ICD-10-CM | POA: Insufficient documentation

## 2018-12-21 DIAGNOSIS — Z79899 Other long term (current) drug therapy: Secondary | ICD-10-CM | POA: Diagnosis not present

## 2018-12-21 DIAGNOSIS — E785 Hyperlipidemia, unspecified: Secondary | ICD-10-CM | POA: Insufficient documentation

## 2018-12-21 DIAGNOSIS — Z8249 Family history of ischemic heart disease and other diseases of the circulatory system: Secondary | ICD-10-CM | POA: Insufficient documentation

## 2018-12-21 DIAGNOSIS — Z951 Presence of aortocoronary bypass graft: Secondary | ICD-10-CM | POA: Diagnosis not present

## 2018-12-21 NOTE — Progress Notes (Addendum)
Primary Care Physician: Josetta Huddle, MD Primary Cardiologist: Dr Radford Pax Primary Electrophysiologist: Dr Curt Bears Referring Physician: Dr Blossom Hoops is a 78 y.o. male with a history of coronary artery disease status post CABG, hypertension, hyperlipidemia, paroxysmal atrial fibrillation, carotid stenosis status post left CEA, AAA status post repair who presents for follow up in the [facility]. Patient was started on amiodarone in 2017 when he developed postoperative afib. Patient's amiodarone was stopped 11/07/18 due to bradycardia. On 12/15/18 patient reports that he started having symptoms of fatigue and weakness. Seen by Dr Radford Pax on 12/19/18 and found to be in rate controlled afib. He denies any specific triggers. He does admit to alcohol use. A sleep study is pending. He is on Eliquis for a CHADS2VASC score of 6.   Today, he denies symptoms of palpitations, chest pain, orthopnea, PND, lower extremity edema, dizziness, presyncope, syncope, snoring, daytime somnolence, bleeding, or neurologic sequela. The patient is tolerating medications without difficulties and is otherwise without complaint today.    Atrial Fibrillation Risk Factors:  he does have symptoms or diagnosis of sleep apnea. Sleep study pending. he does not have a history of rheumatic fever. he does have a history of alcohol use. The patient does not have a history of early familial atrial fibrillation or other arrhythmias.  he has a BMI of Body mass index is 24.14 kg/m.[name] Filed Weights   12/21/18 1133  Weight: 83 kg    Family History  Problem Relation Age of Onset  . Heart disease Mother        AAA  Before age 42  . Cancer Mother 75       Colon cancer  . Hyperlipidemia Mother   . Heart failure Father      Atrial Fibrillation Management history:  Previous antiarrhythmic drugs: amiodarone Previous cardioversions: none Previous ablations: none CHADS2VASC score: 6  Anticoagulation history: Eliquis   Past Medical History:  Diagnosis Date  . Arthritis    HANDS AND FEET  . Asymptomatic carotid artery stenosis BILATERAL   PER DOPPLER  03/2016  RICA  1 - 39%/  s/p  L CEA  1-39%- followed by Dr. Donnetta Hutching  . CAD (coronary artery disease)    s/p remote stenting, s/p CABG with LIMA to LAD, SVG to RCA and SVG to LCx 12/2015  . Cataract immature BILATERAL   . Diverticulosis   . 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 History of diverticulosis    Noted on Colonoscopy  . History of inguinal herniorrhaphy   . Hydrocele, left   . Hyperlipidemia   . Hypertension   . Increased intraocular pressure   . Nocturia   . Paroxysmal atrial fibrillation ([place]) 06/30/14   chad2vasc score is at least 4  . 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 Retinal detachment   . Stroke [facility])    Past Surgical History:  Procedure Laterality Date  . AAA REPAIR W/ AORTOBIFEMORAL BYPASS  OCT  09811  . ABDOMINAL HERNIA REPAIR  X4  (LAST ONE 1990'S)   inguinal herniorrhaphy  . CARDIAC CATHETERIZATION N/A 12/16/2015   Procedure: Left Heart Cath and Coronary Angiography;  Surgeon: Wellington Hampshire, MD;  Location: [place] [facility];  Service: Cardiovascular;  Laterality: N/A;  . CAROTID ENDARTERECTOMY    . CARPAL TUNNEL RELEASE Right Oct. 2013   Hand  . CORONARY ANGIOPLASTY  2002   PTCA  W/ X2 BM STENTS--  PROXIMA LAD  &  LEFT CIRCUMFLEX TO FIRST OBTUSE MARGINAL BRANCH  . CORONARY ARTERY BYPASS GRAFT N/A 12/18/2015   Procedure: CORONARY ARTERY BYPASS GRAFTING (CABG) x 3, using left internal mammary artery and bilateral   leg greater saphenous vein harvested endoscopically  LIMA to LAD, SVG to Circumflex, SVG to RCA;  Surgeon: Grace Isaac, MD;  Location: [place];  Service: Open Heart Surgery;  Laterality: N/A;  . ENDARTERECTOMY Left 03/11/2015   Procedure: ENDARTERECTOMY LEFT CAROTID;  Surgeon: Rosetta Posner, MD;  Location: [place];  Service:  Vascular;  Laterality: Left;  . EXPLORATORY LAPAROTOMY W/ ENTEROLYSIS FOR PARTIAL SBO  09-23-2008  . heart stents  02/19/00  . HYDROCELE EXCISION  01/24/2011   Procedure: HYDROCELECTOMY ADULT;  Surgeon: Bernestine Amass, MD;  Location: [facility];  Service: Urology;  Laterality: Left;  . Ingrown Toe Nail Right Jan. 2015   Right Great Toe nail  . intest blockage  09/22/08  . PATCH ANGIOPLASTY Left 03/11/2015   Procedure: PATCH ANGIOPLASTY LEFT CAROTID USING HEMASHIELD PLATINUM FINESSE PATCH;  Surgeon: Rosetta Posner, MD;  Location: [place];  Service: Vascular;  Laterality: Left;  . PULLEY RELEASE -- RIGHT 5TH FINGER  2009  . RETINAL DETACHMENT SURGERY  2004   BILATERAL  . TEE WITHOUT CARDIOVERSION N/A 12/18/2015   Procedure: TRANSESOPHAGEAL ECHOCARDIOGRAM (TEE);  Surgeon: Grace Isaac, MD;  Location: [place];  Service: Open Heart Surgery;  Laterality: N/A;  . TOOTH EXTRACTION  Dec 2013    Current Outpatient Medications  Medication Sig Dispense Refill  . acetaminophen (TYLENOL) 500 MG tablet Take 2 tablets (1,000 mg total) by mouth every 6 (six) hours as needed. 30 tablet 0  . amLODipine (NORVASC) 5 MG tablet     . apixaban (ELIQUIS) 5 MG TABS tablet Take 1 tablet (5 mg total) by mouth 2 (two) times daily. 60 tablet 6  . Apoaequorin (PREVAGEN) 10 MG CAPS Take 10 mg by mouth daily.    [name] b complex vitamins tablet Take 1 tablet by mouth daily.      . calcium carbonate (OS-CAL) 600 MG TABS Take 600 mg by mouth daily.      . Cholecalciferol 1000 UNITS capsule Take 1,000 Units by mouth daily.     . cloNIDine (CATAPRES) 0.1 MG tablet TAKE 1 TO 2 TABLETS BY MOUTH AT BEDTIME EVERY DAY  5  . Coenzyme Q10 (CO Q 10) 100 MG CAPS Take 1 capsule by mouth daily.    . cyanocobalamin 1000 MCG tablet Take 1,000 mcg by mouth daily.    [name] EPINEPHrine (EPIPEN) 0.3 mg/0.3 mL SOAJ injection Inject 0.3 mg into the muscle daily as needed (allergic reaction).     . finasteride (PROSCAR) 5 MG tablet Take 5 mg  by mouth daily.    [name] FLUAD QUADRIVALENT 0.5 ML injection     . hydrALAZINE (APRESOLINE) 25 MG tablet Take 25 mg by mouth.    . losartan (COZAAR) 50 MG tablet Take 1 tablet (50 mg total) by mouth daily. 90 tablet 3  . Magnesium Hydroxide (MAGNESIA PO) Take 1,000 mg by mouth daily.     . Multiple Vitamin (MULTIVITAMIN) tablet Take 1 tablet by mouth daily.      . Omega-3 Fatty Acids (FISH OIL PO) Take 1,200 mg by mouth daily.    [name] OVER THE COUNTER MEDICATION Take 0.5 Applicatorfuls by mouth daily. CBD oil    . potassium gluconate 595 (99 K) MG TABS tablet Take 595  mg by mouth.    . rosuvastatin (CRESTOR) 40 MG tablet Take 1 tablet (40 mg total) by mouth every evening. 90 tablet 3  . Tamsulosin HCl (FLOMAX) 0.4 MG CAPS Take 0.8 mg by mouth daily.     . vitamin C (ASCORBIC ACID) 500 MG tablet Take 2,000 mg by mouth daily.     Current Facility-Administered Medications  Medication Dose Route Frequency Provider Last Rate Last Dose  . triamcinolone acetonide (KENALOG) 10 MG/ML injection 10 mg  10 mg Other Once Wallene Huh, DPM        Allergies  Allergen Reactions  . Wasp Venom Shortness Of Breath and Swelling  . Other     Social History   Socioeconomic History  . Marital status: Married    Spouse name: Not on file  . Number of children: Not on file  . Years of education: Not on file  . Highest education level: Not on file  Occupational History  . Not on file  Social Needs  . Financial resource strain: Not on file  . Food insecurity    Worry: Not on file    Inability: Not on file  . Transportation needs    Medical: Not on file    Non-medical: Not on file  Tobacco Use  . Smoking status: Former Smoker    Years: 30.00    Types: Cigarettes    Quit date: 01/18/1978    Years since quitting: 40.9  . Smokeless tobacco: Former Systems developer    Quit date: 1980  Substance and Sexual Activity  . Alcohol use: Yes    Alcohol/week: 2.0 standard drinks    Types: 2 Shots of liquor per week     Comment: 2 oz of vodka per day  . Drug use: No  . Sexual activity: Not on file  Lifestyle  . Physical activity    Days per week: Not on file    Minutes per session: Not on file  . Stress: Not on file  Relationships  . Social Herbalist on phone: Not on file    Gets together: Not on file    Attends religious service: Not on file    Active member of club or organization: Not on file    Attends meetings of clubs or organizations: Not on file    Relationship status: Not on file  . Intimate partner violence    Fear of current or ex partner: Not on file    Emotionally abused: Not on file    Physically abused: Not on file    Forced sexual activity: Not on file  Other Topics Concern  . Not on file  Social History Narrative   Pt lives in [place] with spouse.  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     ROS- All systems are reviewed and negative except as per the HPI above.  Physical Exam: Vitals:   12/21/18 1133  BP: (!) 152/66  Pulse: 63  Weight: 83 kg  Height: 6\' 1"  (1.854 m)    GEN- The patient is well appearing elderly male, alert and oriented x 3 today.   Head- normocephalic, atraumatic Eyes-  Sclera clear, conjunctiva pink Ears- hearing intact Oropharynx- clear Neck- supple  Lungs- Clear to ausculation bilaterally, normal work of breathing Heart- irregular rate and rhythm, no murmurs, rubs or gallops  GI- soft, NT, ND, + BS Extremities- no clubbing, cyanosis, or edema MS- no significant deformity or atrophy Skin- no rash or lesion  Psych- euthymic mood, full affect Neuro- strength and sensation are intact  Wt Readings from Last 3 Encounters:  12/21/18 83 kg  12/19/18 82.6 kg  11/07/18 85.5 kg    EKG today demonstrates afib HR 63, LAFB, QRS 112, QTc 417  Echo 02/23/15 demonstrated  - Left ventricle: The cavity size was normal. Wall thickness was   increased in a pattern of moderate LVH. Systolic function was   normal. The  estimated ejection fraction was in the range of 60%   to 65%. Wall motion was normal; there were no regional wall   motion abnormalities. Doppler parameters are consistent with   abnormal left ventricular relaxation (grade 1 diastolic   dysfunction). The E/e&' ratio is between 8-15, suggesting   indeteminate LV filling pressure. - Aortic valve: Trileaflet; mildly calcified leaflets. There was no   stenosis. There was no significant regurgitation. - Mitral valve: Moderate posterior calcified annulus. Mildly   thickened leaflets . - Left atrium: Severely dilated at 58 ml/m2. - Right ventricle: The cavity size was mildly dilated. Systolic   function was normal. - Right atrium: The atrium was mildly dilated. - Atrial septum: Aneurysmal IAS - PFO cannot be excluded. - Tricuspid valve: There was mild regurgitation. - Pulmonary arteries: PA peak pressure: 26 mm Hg (S). - Inferior vena cava: The vessel was normal in size. The   respirophasic diameter changes were in the normal range (= 50%),   consistent with normal central venous pressure.  Impressions:  - Compared with a prior study in 07/2014, there are few changes   except there is now severe LAE. An atrial septal aneurysm is   noted, PFO cannot be excluded.  Epic records are reviewed at length today  Assessment and Plan:  1. Persistent atrial fibrillation/atrial tach Patient has failed amiodarone due to bradycardia. He is back in afib today. We discussed therapeutic options today. Patient is not interested in dofetilide admission.  Will plan for DCCV alone. Recent labs reviewed.  Continue Eliquis 5 mg BID. Patient reports no missed doses. Patient is interested in afib ablation. He did have a severely dilated LA on echo in 2017. Would likely need to recheck prior to ablation consideration. This could make maintaining SR difficult long term.  This patients CHA2DS2-VASc Score and unadjusted Ischemic Stroke Rate (% per year) is equal  to 9.7 % stroke rate/year from a score of 6  Above score calculated as 1 point each if present [CHF, HTN, DM, Vascular=MI/PAD/Aortic Plaque, Age if 65-74, or Male] Above score calculated as 2 points each if present [Age > 75, or Stroke/TIA/TE]   2. CAD s/p CABG with LIMA to LAD, SVG to RCA and SVG to LCx 12/2015. No anginal symptoms. Followed by Dr Radford Pax.  3. Nocturnal bradycardia Sleep study pending per Dr Radford Pax.  4. HTN Stable, no changes today.   Follow up one week post DCCV in the AF clinic.   [facility] [address] [place], [place] [address] [phone] 12/21/2018 12:13 PM

## 2018-12-21 NOTE — Patient Instructions (Signed)
Cardioversion scheduled for December 11th  - Arrive at the [facility] and go to admitting at 10:30am  -Do not eat or drink anything after midnight the night prior to your procedure.  - Take all your morning medications with a sip of water prior to arrival.  - You will not be able to drive home after your procedure.

## 2018-12-24 ENCOUNTER — Telehealth: Payer: Self-pay | Admitting: *Deleted

## 2018-12-24 NOTE — Telephone Encounter (Signed)
Staff message sent to [name] received. Ok to schedule sleep study. Auth # [id number]. Valid dates 12/19/18 to 03/19/19.

## 2018-12-25 ENCOUNTER — Other Ambulatory Visit (HOSPITAL_COMMUNITY)
Admission: RE | Admit: 2018-12-25 | Discharge: 2018-12-25 | Disposition: A | Discharge status: Home / Self Care | Payer: PPO | Source: Ambulatory Visit | Attending: Cardiology | Admitting: Cardiology

## 2018-12-25 DIAGNOSIS — Z20828 Contact with and (suspected) exposure to other viral communicable diseases: Secondary | ICD-10-CM | POA: Diagnosis not present

## 2018-12-25 DIAGNOSIS — Z01812 Encounter for preprocedural laboratory examination: Secondary | ICD-10-CM | POA: Insufficient documentation

## 2018-12-26 LAB — NOVEL CORONAVIRUS, NAA (HOSP ORDER, SEND-OUT TO REF LAB; TAT 18-24 HRS): SARS-CoV-2, NAA: NOT DETECTED

## 2018-12-28 ENCOUNTER — Ambulatory Visit (HOSPITAL_COMMUNITY)
Admission: RE | Admit: 2018-12-28 | Discharge: 2018-12-28 | Disposition: A | Discharge status: Home / Self Care | Payer: PPO | Attending: Cardiology | Admitting: Cardiology

## 2018-12-28 ENCOUNTER — Encounter (HOSPITAL_COMMUNITY)
Admission: RE | Disposition: A | Discharge status: Home / Self Care | Payer: PPO | Source: Home / Self Care | Attending: Cardiology

## 2018-12-28 DIAGNOSIS — I4819 Other persistent atrial fibrillation: Secondary | ICD-10-CM | POA: Insufficient documentation

## 2018-12-28 DIAGNOSIS — Z538 Procedure and treatment not carried out for other reasons: Secondary | ICD-10-CM | POA: Diagnosis not present

## 2018-12-28 SURGERY — CANCELLED PROCEDURE

## 2018-12-28 NOTE — Progress Notes (Signed)
   Pt in normal rhythm. HR 48 with PAC.   Cardioversion cancelled.   Discussed with Mr. Kick.   He is frustrated that he does not feel any better.  Candee Furbish, MD

## 2018-12-28 NOTE — Progress Notes (Signed)
Cardioversion canceled.  Pt in NSR - self converted.

## 2019-01-01 ENCOUNTER — Telehealth: Payer: Self-pay | Admitting: Cardiology

## 2019-01-01 ENCOUNTER — Telehealth: Payer: Self-pay | Admitting: *Deleted

## 2019-01-01 NOTE — Telephone Encounter (Signed)
New Message:  Gilmore Laroche from [facility] was calling to get clearance for the patient to resume PT. The patient was supposed to have a procedure done to correct his Afib. He says that he is able to continue his PT, but Gilmore Laroche will just need formal orders from Dr. Radford Pax that it is safe for him to continue PT. Orders can be faxed to her at [phone]

## 2019-01-01 NOTE — Telephone Encounter (Signed)
Ok to resume PT

## 2019-01-01 NOTE — Telephone Encounter (Signed)
-----   Message from Lauralee Evener, Johnson sent at 12/24/2018 12:28 PM EST ----- Regarding: RE: precert HTA received. Ok to schedule. Auth # [id number]. Valid dates 12/19/18 to 03/19/19. ----- Message ----- From: Freada Bergeron, CMA Sent: 12/19/2018  12:20 PM EST To: [facility] Subject: precert                                        split ----- Message ----- From: Antonieta Iba, RN Sent: 12/19/2018  12:05 PM EST To: Freada Bergeron, CMA Subject: Sleep Study                                    Order was previously placed for a sleep study, but patient was waiting to have visit with Dr. Radford Pax first. Visit has been complete, please schedule sleep study for patient ASAP.   Thanks!

## 2019-01-01 NOTE — Telephone Encounter (Signed)
Patient is scheduled for lab study on 01/06/19. Pt  is schedul ed for COVID screening on 01/03/19 2:30 prior to SS. Patient understands his sleep study will be done at [facility] sleep lab. Patient understands he will receive a sleep packet in a week or so. Patient understands to call if he does not receive the sleep packet in a timely manner. Patient agrees with treatment and thanked me for call.

## 2019-01-01 NOTE — Telephone Encounter (Signed)
Called [name] and let her know that Dr. Radford Pax said it was okay for [name] to return to PT and that I sent over a fax with confirmation from Dr. Radford Pax.

## 2019-01-03 ENCOUNTER — Other Ambulatory Visit (HOSPITAL_COMMUNITY): Payer: Self-pay

## 2019-01-03 ENCOUNTER — Other Ambulatory Visit (HOSPITAL_COMMUNITY)
Admission: RE | Admit: 2019-01-03 | Discharge: 2019-01-03 | Disposition: A | Discharge status: Home / Self Care | Payer: PPO | Source: Ambulatory Visit | Attending: Cardiology | Admitting: Cardiology

## 2019-01-03 DIAGNOSIS — Z01812 Encounter for preprocedural laboratory examination: Secondary | ICD-10-CM | POA: Diagnosis not present

## 2019-01-03 DIAGNOSIS — Z20828 Contact with and (suspected) exposure to other viral communicable diseases: Secondary | ICD-10-CM | POA: Insufficient documentation

## 2019-01-03 LAB — SARS CORONAVIRUS 2 (TAT 6-24 HRS): SARS Coronavirus 2: NEGATIVE

## 2019-01-04 ENCOUNTER — Encounter (HOSPITAL_COMMUNITY): Payer: Self-pay | Admitting: Physician Assistant

## 2019-01-04 ENCOUNTER — Ambulatory Visit (HOSPITAL_COMMUNITY)
Admission: RE | Admit: 2019-01-04 | Discharge: 2019-01-04 | Disposition: A | Discharge status: Home / Self Care | Payer: PPO | Source: Ambulatory Visit | Attending: Physician Assistant | Admitting: Physician Assistant

## 2019-01-04 ENCOUNTER — Other Ambulatory Visit: Payer: Self-pay

## 2019-01-04 VITALS — BP 140/64 | HR 58 | Ht 73.0 in | Wt 187.8 lb

## 2019-01-04 DIAGNOSIS — Z7901 Long term (current) use of anticoagulants: Secondary | ICD-10-CM | POA: Insufficient documentation

## 2019-01-04 DIAGNOSIS — Z8673 Personal history of transient ischemic attack (TIA), and cerebral infarction without residual deficits: Secondary | ICD-10-CM | POA: Insufficient documentation

## 2019-01-04 DIAGNOSIS — Z8249 Family history of ischemic heart disease and other diseases of the circulatory system: Secondary | ICD-10-CM | POA: Diagnosis not present

## 2019-01-04 DIAGNOSIS — Z951 Presence of aortocoronary bypass graft: Secondary | ICD-10-CM | POA: Diagnosis not present

## 2019-01-04 DIAGNOSIS — E785 Hyperlipidemia, unspecified: Secondary | ICD-10-CM | POA: Insufficient documentation

## 2019-01-04 DIAGNOSIS — I10 Essential (primary) hypertension: Secondary | ICD-10-CM | POA: Insufficient documentation

## 2019-01-04 DIAGNOSIS — I4819 Other persistent atrial fibrillation: Secondary | ICD-10-CM

## 2019-01-04 DIAGNOSIS — I714 Abdominal aortic aneurysm, without rupture: Secondary | ICD-10-CM | POA: Diagnosis not present

## 2019-01-04 DIAGNOSIS — Z87891 Personal history of nicotine dependence: Secondary | ICD-10-CM | POA: Insufficient documentation

## 2019-01-04 DIAGNOSIS — I6523 Occlusion and stenosis of bilateral carotid arteries: Secondary | ICD-10-CM | POA: Insufficient documentation

## 2019-01-04 DIAGNOSIS — D6869 Other thrombophilia: Secondary | ICD-10-CM

## 2019-01-04 DIAGNOSIS — I2510 Atherosclerotic heart disease of native coronary artery without angina pectoris: Secondary | ICD-10-CM | POA: Diagnosis not present

## 2019-01-04 DIAGNOSIS — Z79899 Other long term (current) drug therapy: Secondary | ICD-10-CM | POA: Insufficient documentation

## 2019-01-04 NOTE — Progress Notes (Signed)
Primary Care Physician: Josetta Huddle, MD Primary Cardiologist: Dr Radford Pax Primary Electrophysiologist: Dr Curt Bears Referring Physician: Dr Blossom Hoops is a 78 y.o. male with a history of coronary artery disease status post CABG, hypertension, hyperlipidemia, paroxysmal atrial fibrillation, carotid stenosis status post left CEA, AAA status post repair who presents for follow up in the [facility]. Patient was started on amiodarone in 2017 when he developed postoperative afib. Patient's amiodarone was stopped 11/07/18 due to bradycardia. On 12/15/18 patient reports that he started having symptoms of fatigue and weakness. Seen by Dr Radford Pax on 12/19/18 and found to be in rate controlled afib. He denies any specific triggers. He does admit to alcohol use. A sleep study is pending. He is on Eliquis for a CHADS2VASC score of 6.   On follow up today, patient presented for DCCV on 12/28/18 but was in [place]. Patient reports he has felt well with no symptoms of heart racing or palpitations. He is not sure when he converted back to SR but he notes that he feels better overall.  Today, he denies symptoms of palpitations, chest pain, orthopnea, PND, lower extremity edema, dizziness, presyncope, syncope, snoring, daytime somnolence, bleeding, or neurologic sequela. The patient is tolerating medications without difficulties and is otherwise without complaint today.    Atrial Fibrillation Risk Factors:  he does have symptoms or diagnosis of sleep apnea. Sleep study pending. he does not have a history of rheumatic fever. he does have a history of alcohol use. The patient does not have a history of early familial atrial fibrillation or other arrhythmias.  he has a BMI of Body mass index is 24.78 kg/m.[name] Filed Weights   01/04/19 1040  Weight: 85.2 kg    Family History  Problem Relation Age of Onset  . Heart disease Mother        AAA  Before age 84  . Cancer Mother  22       Colon cancer  . Hyperlipidemia Mother   . Heart failure Father      Atrial Fibrillation Management history:  Previous antiarrhythmic drugs: amiodarone Previous cardioversions: none Previous ablations: none CHADS2VASC score: 6 Anticoagulation history: Eliquis   Past Medical History:  Diagnosis Date  . Arthritis    HANDS AND FEET  . Asymptomatic carotid artery stenosis BILATERAL   PER DOPPLER  03/2016  RICA  1 - 39%/  s/p  L CEA  1-39%- followed by Dr. Donnetta Hutching  . CAD (coronary artery disease)    s/p remote stenting, s/p CABG with LIMA to LAD, SVG to RCA and SVG to LCx 12/2015  . Cataract immature BILATERAL   . Diverticulosis   . 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 History of diverticulosis    Noted on Colonoscopy  . History of inguinal herniorrhaphy   . Hydrocele, left   . Hyperlipidemia   . Hypertension   . Increased intraocular pressure   . Nocturia   . Paroxysmal atrial fibrillation ([place]) 06/30/14   chad2vasc score is at least 4  . 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 Retinal detachment   . Stroke [facility])    Past Surgical History:  Procedure Laterality Date  . AAA REPAIR W/ AORTOBIFEMORAL BYPASS  OCT  24401  . ABDOMINAL HERNIA REPAIR  X4  (LAST ONE 1990'S)   inguinal herniorrhaphy  . CARDIAC CATHETERIZATION N/A 12/16/2015   Procedure: Left Heart Cath and Coronary Angiography;  Surgeon:  Wellington Hampshire, MD;  Location: [facility];  Service: Cardiovascular;  Laterality: N/A;  . CAROTID ENDARTERECTOMY    . CARPAL TUNNEL RELEASE Right Oct. 2013   Hand  . CORONARY ANGIOPLASTY  2002   PTCA  W/ X2 BM STENTS--   PROXIMA LAD  &  LEFT CIRCUMFLEX TO FIRST OBTUSE MARGINAL BRANCH  . CORONARY ARTERY BYPASS GRAFT N/A 12/18/2015   Procedure: CORONARY ARTERY BYPASS GRAFTING (CABG) x 3, using left internal mammary artery and bilateral   leg greater saphenous vein harvested endoscopically  LIMA to LAD, SVG to  Circumflex, SVG to RCA;  Surgeon: Grace Isaac, MD;  Location: [place];  Service: Open Heart Surgery;  Laterality: N/A;  . ENDARTERECTOMY Left 03/11/2015   Procedure: ENDARTERECTOMY LEFT CAROTID;  Surgeon: Rosetta Posner, MD;  Location: [place];  Service: Vascular;  Laterality: Left;  . EXPLORATORY LAPAROTOMY W/ ENTEROLYSIS FOR PARTIAL SBO  09-23-2008  . heart stents  02/19/00  . HYDROCELE EXCISION  01/24/2011   Procedure: HYDROCELECTOMY ADULT;  Surgeon: Bernestine Amass, MD;  Location: [facility];  Service: Urology;  Laterality: Left;  . Ingrown Toe Nail Right Jan. 2015   Right Great Toe nail  . intest blockage  09/22/08  . PATCH ANGIOPLASTY Left 03/11/2015   Procedure: PATCH ANGIOPLASTY LEFT CAROTID USING HEMASHIELD PLATINUM FINESSE PATCH;  Surgeon: Rosetta Posner, MD;  Location: [place];  Service: Vascular;  Laterality: Left;  . PULLEY RELEASE -- RIGHT 5TH FINGER  2009  . RETINAL DETACHMENT SURGERY  2004   BILATERAL  . TEE WITHOUT CARDIOVERSION N/A 12/18/2015   Procedure: TRANSESOPHAGEAL ECHOCARDIOGRAM (TEE);  Surgeon: Grace Isaac, MD;  Location: [place];  Service: Open Heart Surgery;  Laterality: N/A;  . TOOTH EXTRACTION  Dec 2013    Current Outpatient Medications  Medication Sig Dispense Refill  . acetaminophen (TYLENOL) 500 MG tablet Take 2 tablets (1,000 mg total) by mouth every 6 (six) hours as needed. (Patient taking differently: Take 1,000 mg by mouth every 6 (six) hours as needed for headache. ) 30 tablet 0  . amLODipine (NORVASC) 10 MG tablet Take 10 mg by mouth daily.     [name] apixaban (ELIQUIS) 5 MG TABS tablet Take 1 tablet (5 mg total) by mouth 2 (two) times daily. 60 tablet 6  . Apoaequorin (PREVAGEN) 10 MG CAPS Take 10 mg by mouth daily.    [name] b complex vitamins tablet Take 1 tablet by mouth daily.      . calcium carbonate (OS-CAL) 600 MG TABS Take 600 mg by mouth daily.      . Cholecalciferol 1000 UNITS capsule Take 2,000 Units by mouth daily.     . cloNIDine  (CATAPRES) 0.1 MG tablet Take 0.1 mg by mouth at bedtime as needed.   5  . Coenzyme Q10 (CO Q 10) 100 MG CAPS Take 200 mg by mouth daily.     . cyanocobalamin 1000 MCG tablet Take 1,000 mcg by mouth daily.    [name] EPINEPHrine (EPIPEN) 0.3 mg/0.3 mL SOAJ injection Inject 0.3 mg into the muscle daily as needed for anaphylaxis (allergic reaction).     . finasteride (PROSCAR) 5 MG tablet Take 5 mg by mouth daily.    [name] losartan (COZAAR) 50 MG tablet Take 1 tablet (50 mg total) by mouth daily. (Patient taking differently: Take 50 mg by mouth 2 (two) times daily. ) 90 tablet 3  . Magnesium Hydroxide (MAGNESIA PO) Take 1,000 mg by mouth daily.     [name]  Multiple Vitamin (MULTIVITAMIN) tablet Take 1 tablet by mouth daily.      . Omega-3 Fatty Acids (FISH OIL PO) Take 600 mg by mouth 2 (two) times daily.     [name] OVER THE COUNTER MEDICATION Take 0.5 Applicatorfuls by mouth daily. CBD oil    . potassium gluconate 595 (99 K) MG TABS tablet Take 595 mg by mouth.    . rosuvastatin (CRESTOR) 40 MG tablet Take 1 tablet (40 mg total) by mouth every evening. 90 tablet 3  . Tamsulosin HCl (FLOMAX) 0.4 MG CAPS Take 0.8 mg by mouth at bedtime.     . vitamin C (ASCORBIC ACID) 500 MG tablet Take 2,000 mg by mouth daily.     Current Facility-Administered Medications  Medication Dose Route Frequency Provider Last Rate Last Admin  . triamcinolone acetonide (KENALOG) 10 MG/ML injection 10 mg  10 mg Other Once Wallene Huh, DPM        Allergies  Allergen Reactions  . Wasp Venom Shortness Of Breath and Swelling  . Other     Social History   Socioeconomic History  . Marital status: Married    Spouse name: Not on file  . Number of children: Not on file  . Years of education: Not on file  . Highest education level: Not on file  Occupational History  . Not on file  Tobacco Use  . Smoking status: Former Smoker    Years: 30.00    Types: Cigarettes    Quit date: 01/18/1978    Years since quitting: 40.9  . Smokeless  tobacco: Former Systems developer    Quit date: 1980  Substance and Sexual Activity  . Alcohol use: Yes    Alcohol/week: 2.0 standard drinks    Types: 2 Shots of liquor per week    Comment: 2 oz of vodka per day  . Drug use: No  . Sexual activity: Not on file  Other Topics Concern  . Not on file  Social History Narrative   Pt lives in [place] with spouse.  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   Social Determinants of Health   Financial Resource Strain:   . Difficulty of Paying Living Expenses: Not on file  Food Insecurity:   . Worried About Charity fundraiser in the Last Year: Not on file  . Ran Out of Food in the Last Year: Not on file  Transportation Needs:   . Lack of Transportation (Medical): Not on file  . Lack of Transportation (Non-Medical): Not on file  Physical Activity:   . Days of Exercise per Week: Not on file  . Minutes of Exercise per Session: Not on file  Stress:   . Feeling of Stress : Not on file  Social Connections:   . Frequency of Communication with Friends and Family: Not on file  . Frequency of Social Gatherings with Friends and Family: Not on file  . Attends Religious Services: Not on file  . Active Member of Clubs or Organizations: Not on file  . Attends Archivist Meetings: Not on file  . Marital Status: Not on file  Intimate Partner Violence:   . Fear of Current or Ex-Partner: Not on file  . Emotionally Abused: Not on file  . Physically Abused: Not on file  . Sexually Abused: Not on file     ROS- All systems are reviewed and negative except as per the HPI above.  Physical Exam: Vitals:   01/04/19 1040  BP: 140/64  Pulse: (!) 58  Weight: 85.2 kg  Height: 6\' 1"  (1.854 m)    GEN- The patient is well appearing elderly male, alert and oriented x 3 today.   HEENT-head normocephalic, atraumatic, sclera clear, conjunctiva pink, hearing intact, trachea midline. Lungs- Clear to ausculation bilaterally, normal work of  breathing Heart- Regular rate and rhythm, no murmurs, rubs or gallops  GI- soft, NT, ND, + BS Extremities- no clubbing, cyanosis, or edema MS- no significant deformity or atrophy Skin- no rash or lesion Psych- euthymic mood, full affect Neuro- strength and sensation are intact   Wt Readings from Last 3 Encounters:  01/04/19 85.2 kg  12/21/18 83 kg  12/19/18 82.6 kg    EKG today demonstrates SR HR 58, 1st degree AV block, LAFB, PAC, PR 220, QRS 122, QTc 424  Echo 02/23/15 demonstrated  - Left ventricle: The cavity size was normal. Wall thickness was   increased in a pattern of moderate LVH. Systolic function was   normal. The estimated ejection fraction was in the range of 60%   to 65%. Wall motion was normal; there were no regional wall   motion abnormalities. Doppler parameters are consistent with   abnormal left ventricular relaxation (grade 1 diastolic   dysfunction). The E/e&' ratio is between 8-15, suggesting   indeteminate LV filling pressure. - Aortic valve: Trileaflet; mildly calcified leaflets. There was no   stenosis. There was no significant regurgitation. - Mitral valve: Moderate posterior calcified annulus. Mildly   thickened leaflets . - Left atrium: Severely dilated at 58 ml/m2. - Right ventricle: The cavity size was mildly dilated. Systolic   function was normal. - Right atrium: The atrium was mildly dilated. - Atrial septum: Aneurysmal IAS - PFO cannot be excluded. - Tricuspid valve: There was mild regurgitation. - Pulmonary arteries: PA peak pressure: 26 mm Hg (S). - Inferior vena cava: The vessel was normal in size. The   respirophasic diameter changes were in the normal range (= 50%),   consistent with normal central venous pressure.  Impressions:  - Compared with a prior study in 07/2014, there are few changes   except there is now severe LAE. An atrial septal aneurysm is   noted, PFO cannot be excluded.  Epic records are reviewed at length  today  Assessment and Plan:  1. Persistent atrial fibrillation/atrial tach Patient has failed amiodarone due to bradycardia.  Patient scheduled for DCCV but presented in [place]. Patient remains in [place] today. We discussed therapeutic options again including dofetilide admission vs ablation. Discussed that he may not be an ablation candidate given h/o severely dilated LA Check echocardiogram to evaluate LA size. As he is stable and in [place] will defer dofetilide admission for now given COVID pandemic.  Continue Eliquis 5 mg BID.   This patients CHA2DS2-VASc Score and unadjusted Ischemic Stroke Rate (% per year) is equal to 9.7 % stroke rate/year from a score of 6  Above score calculated as 1 point each if present [CHF, HTN, DM, Vascular=MI/PAD/Aortic Plaque, Age if 65-74, or Male] Above score calculated as 2 points each if present [Age > 75, or Stroke/TIA/TE]   2. CAD s/p CABG with LIMA to LAD, SVG to RCA and SVG to LCx 12/2015. No anginal symptoms. Followed by Dr Radford Pax.  3. Nocturnal bradycardia Sleep study pending per Dr Radford Pax.  4. HTN Stable, no changes today.   Follow up in the AF clinic in 2 months.    [facility] [address]  [place], [place] [address] [phone] 01/04/2019 11:04 AM

## 2019-01-06 ENCOUNTER — Other Ambulatory Visit: Payer: Self-pay

## 2019-01-06 ENCOUNTER — Ambulatory Visit (HOSPITAL_BASED_OUTPATIENT_CLINIC_OR_DEPARTMENT_OTHER): Payer: PPO | Attending: Cardiology | Admitting: Cardiology

## 2019-01-06 DIAGNOSIS — R001 Bradycardia, unspecified: Secondary | ICD-10-CM

## 2019-01-06 DIAGNOSIS — R0683 Snoring: Secondary | ICD-10-CM | POA: Insufficient documentation

## 2019-01-06 DIAGNOSIS — I4819 Other persistent atrial fibrillation: Secondary | ICD-10-CM | POA: Diagnosis not present

## 2019-01-06 DIAGNOSIS — R351 Nocturia: Secondary | ICD-10-CM | POA: Diagnosis not present

## 2019-01-06 DIAGNOSIS — I6350 Cerebral infarction due to unspecified occlusion or stenosis of unspecified cerebral artery: Secondary | ICD-10-CM | POA: Diagnosis not present

## 2019-01-08 NOTE — Procedures (Signed)
   Patient Name: [name], [name] Date:01/06/2019 Gender: Male D.O.B: 03/06/1940 Age (years): 18 Referring Provider: Fransico Him MD, ABSM Height (inches): 71 Interpreting Physician: Fransico Him MD, ABSM Weight (lbs): 182 RPSGT: Baxter Flattery BMI: 25 MRN: [id number] Neck Size: 17.00  CLINICAL INFORMATION Sleep Study Type: NPSG  Indication for sleep study: Snoring, Witnesses Apnea / Gasping During Sleep  Epworth Sleepiness Score: 1  SLEEP STUDY TECHNIQUE As per the AASM Manual for the Scoring of Sleep and Associated Events v2.3 (April 2016) with a hypopnea requiring 4% desaturations.  The channels recorded and monitored were frontal, central and occipital EEG, electrooculogram (EOG), submentalis EMG (chin), nasal and oral airflow, thoracic and abdominal wall motion, anterior tibialis EMG, snore microphone, electrocardiogram, and pulse oximetry.  MEDICATIONS Medications self-administered by patient taken the night of the study : N/A  SLEEP ARCHITECTURE The study was initiated at 10:14:05 PM and ended at 4:41:56 AM.  Sleep onset time was 13.7 minutes and the sleep efficiency was 68.2%. The total sleep time was 264.6 minutes.  Stage REM latency was 207.0 minutes.  The patient spent 8.5% of the night in stage N1 sleep, 83.0% in stage N2 sleep, 0.0% in stage N3 and 8.5% in REM.  Alpha intrusion was absent.  Supine sleep was 24.04%.  RESPIRATORY PARAMETERS The overall apnea/hypopnea index (AHI) was 0.2 per hour. There were 0 total apneas, including 0 obstructive, 0 central and 0 mixed apneas. There were 1 hypopneas and 3 RERAs.  The AHI during Stage REM sleep was 2.7 per hour.  AHI while supine was 0.9 per hour.  The mean oxygen saturation was 94.1%. The minimum SpO2 during sleep was 92.0%.  soft snoring was noted during this study.  CARDIAC DATA  The 2 lead EKG demonstrated sinus rhythm, atrial fibrillation. The mean heart rate was 44.1 beats per minute. Other EKG  findings include: None.  LEG MOVEMENT DATA The total PLMS were 0 with a resulting PLMS index of 0.0. Associated arousal with leg movement index was 0.0 .  IMPRESSIONS - No significant obstructive sleep apnea occurred during this study (AHI = 0.2/h). - No significant central sleep apnea occurred during this study (CAI = 0.0/h). - The patient had minimal or no oxygen desaturation during the study (Min O2 = 92.0%) - The patient snored with soft snoring volume. - No cardiac abnormalities were noted during this study. - Clinically significant periodic limb movements did not occur during sleep. No significant associated arousals.  DIAGNOSIS - Normal Study  RECOMMENDATIONS - Avoid alcohol, sedatives and other CNS depressants that may worsen sleep apnea and disrupt normal sleep architecture. - Sleep hygiene should be reviewed to assess factors that may improve sleep quality. - Weight management and regular exercise should be initiated or continued if appropriate.  [Electronically signed] 01/08/2019 04:34 PM  Fransico Him MD, ABSM Diplomate, American Board of Sleep Medicine

## 2019-01-09 ENCOUNTER — Telehealth: Payer: Self-pay | Admitting: *Deleted

## 2019-01-09 NOTE — Telephone Encounter (Signed)
-----   Message from Sueanne Margarita, MD sent at 01/08/2019  4:37 PM EST ----- Please let patient know that sleep study showed no significant sleep apnea.

## 2019-01-09 NOTE — Telephone Encounter (Signed)
Informed patient of sleep study results and patient understanding was verbalized. Patient understands his sleep study showed no significant sleep apnea.   Pt is aware and agreeable to normal results. 

## 2019-01-22 DIAGNOSIS — M25552 Pain in left hip: Secondary | ICD-10-CM | POA: Diagnosis not present

## 2019-01-22 DIAGNOSIS — M25551 Pain in right hip: Secondary | ICD-10-CM | POA: Diagnosis not present

## 2019-01-23 ENCOUNTER — Ambulatory Visit (HOSPITAL_COMMUNITY)
Admission: RE | Admit: 2019-01-23 | Discharge: 2019-01-23 | Disposition: A | Discharge status: Home / Self Care | Payer: PPO | Source: Ambulatory Visit | Attending: Physician Assistant | Admitting: Physician Assistant

## 2019-01-23 ENCOUNTER — Other Ambulatory Visit: Payer: Self-pay

## 2019-01-23 DIAGNOSIS — I714 Abdominal aortic aneurysm, without rupture: Secondary | ICD-10-CM | POA: Diagnosis not present

## 2019-01-23 DIAGNOSIS — I4819 Other persistent atrial fibrillation: Secondary | ICD-10-CM | POA: Diagnosis not present

## 2019-01-23 DIAGNOSIS — Z87891 Personal history of nicotine dependence: Secondary | ICD-10-CM | POA: Insufficient documentation

## 2019-01-23 DIAGNOSIS — I739 Peripheral vascular disease, unspecified: Secondary | ICD-10-CM | POA: Insufficient documentation

## 2019-01-23 DIAGNOSIS — Z8673 Personal history of transient ischemic attack (TIA), and cerebral infarction without residual deficits: Secondary | ICD-10-CM | POA: Insufficient documentation

## 2019-01-23 DIAGNOSIS — I081 Rheumatic disorders of both mitral and tricuspid valves: Secondary | ICD-10-CM | POA: Diagnosis not present

## 2019-01-23 DIAGNOSIS — I119 Hypertensive heart disease without heart failure: Secondary | ICD-10-CM | POA: Diagnosis not present

## 2019-01-23 NOTE — Progress Notes (Signed)
  Echocardiogram 2D Echocardiogram has been performed.  Burnett Kanaris 01/23/2019, 10:56 AM

## 2019-01-24 ENCOUNTER — Encounter (HOSPITAL_COMMUNITY): Payer: Self-pay | Admitting: *Deleted

## 2019-01-24 DIAGNOSIS — M25552 Pain in left hip: Secondary | ICD-10-CM | POA: Diagnosis not present

## 2019-01-24 DIAGNOSIS — M25551 Pain in right hip: Secondary | ICD-10-CM | POA: Diagnosis not present

## 2019-01-29 DIAGNOSIS — M25551 Pain in right hip: Secondary | ICD-10-CM | POA: Diagnosis not present

## 2019-01-29 DIAGNOSIS — M25552 Pain in left hip: Secondary | ICD-10-CM | POA: Diagnosis not present

## 2019-01-31 DIAGNOSIS — M25552 Pain in left hip: Secondary | ICD-10-CM | POA: Diagnosis not present

## 2019-01-31 DIAGNOSIS — M1612 Unilateral primary osteoarthritis, left hip: Secondary | ICD-10-CM | POA: Diagnosis not present

## 2019-01-31 DIAGNOSIS — M25551 Pain in right hip: Secondary | ICD-10-CM | POA: Diagnosis not present

## 2019-02-04 DIAGNOSIS — I701 Atherosclerosis of renal artery: Secondary | ICD-10-CM | POA: Diagnosis not present

## 2019-02-04 DIAGNOSIS — N400 Enlarged prostate without lower urinary tract symptoms: Secondary | ICD-10-CM | POA: Diagnosis not present

## 2019-02-04 DIAGNOSIS — N1831 Chronic kidney disease, stage 3a: Secondary | ICD-10-CM | POA: Diagnosis not present

## 2019-02-04 DIAGNOSIS — I10 Essential (primary) hypertension: Secondary | ICD-10-CM | POA: Diagnosis not present

## 2019-02-04 DIAGNOSIS — I119 Hypertensive heart disease without heart failure: Secondary | ICD-10-CM | POA: Diagnosis not present

## 2019-02-04 DIAGNOSIS — E7800 Pure hypercholesterolemia, unspecified: Secondary | ICD-10-CM | POA: Diagnosis not present

## 2019-02-04 DIAGNOSIS — I63512 Cerebral infarction due to unspecified occlusion or stenosis of left middle cerebral artery: Secondary | ICD-10-CM | POA: Diagnosis not present

## 2019-02-04 DIAGNOSIS — I4891 Unspecified atrial fibrillation: Secondary | ICD-10-CM | POA: Diagnosis not present

## 2019-02-04 DIAGNOSIS — M159 Polyosteoarthritis, unspecified: Secondary | ICD-10-CM | POA: Diagnosis not present

## 2019-02-04 DIAGNOSIS — E785 Hyperlipidemia, unspecified: Secondary | ICD-10-CM | POA: Diagnosis not present

## 2019-02-04 DIAGNOSIS — I2510 Atherosclerotic heart disease of native coronary artery without angina pectoris: Secondary | ICD-10-CM | POA: Diagnosis not present

## 2019-02-04 DIAGNOSIS — M170 Bilateral primary osteoarthritis of knee: Secondary | ICD-10-CM | POA: Diagnosis not present

## 2019-02-05 DIAGNOSIS — M25552 Pain in left hip: Secondary | ICD-10-CM | POA: Diagnosis not present

## 2019-02-05 DIAGNOSIS — M25551 Pain in right hip: Secondary | ICD-10-CM | POA: Diagnosis not present

## 2019-02-12 DIAGNOSIS — M25551 Pain in right hip: Secondary | ICD-10-CM | POA: Diagnosis not present

## 2019-02-12 DIAGNOSIS — M25552 Pain in left hip: Secondary | ICD-10-CM | POA: Diagnosis not present

## 2019-02-13 DIAGNOSIS — M25551 Pain in right hip: Secondary | ICD-10-CM | POA: Diagnosis not present

## 2019-02-13 DIAGNOSIS — M25552 Pain in left hip: Secondary | ICD-10-CM | POA: Diagnosis not present

## 2019-02-15 ENCOUNTER — Telehealth: Payer: Self-pay | Admitting: Cardiology

## 2019-02-15 DIAGNOSIS — I4819 Other persistent atrial fibrillation: Secondary | ICD-10-CM

## 2019-02-15 NOTE — Telephone Encounter (Signed)
Patient will come in on Monday for an EKG.

## 2019-02-15 NOTE — Telephone Encounter (Signed)
Please have him come in for EKG

## 2019-02-15 NOTE — Telephone Encounter (Signed)
New Message  Patient c/o Palpitations:  High priority if patient c/o lightheadedness, shortness of breath, or chest pain  1) How long have you had palpitations/irregular HR/ Afib? Are you having the symptoms now? Wednesday evening after cortizone shot; no symptoms at moment  2) Are you currently experiencing lightheadedness, SOB or CP? No  3) Do you have a history of afib (atrial fibrillation) or irregular heart rhythm? Yes  4) Have you checked your BP or HR? (document readings if available): current 140/60; 68; yesterday evening readings were 181/60; 59  180/78; 56 176/80; 107  5) Are you experiencing any other symptoms? No   Pt wants to know if cortizone shot have anything to do with irregular heart beat and elevated blood pressure?

## 2019-02-15 NOTE — Telephone Encounter (Signed)
Patient states that his heart has been beating irregularly. This started Wednesday afternoon after he had two cortisone injections in his hip that morning. Thursday afternoon he took his blood pressure which was increased: BP 181/60 HR 59; BP 180/78 HR 56; BP 176/80 HR107. BP has since come down to 130s/60s. He states that his heart rate today has ranged from 56-62 bpm but that it is still irregular. He is not having any palpitations, SOB, CP, or dizziness. He states that he is feeling good and taking his medications as prescribed. Reassured patient that he is anticoagulated and that I will forward to Dr. Radford Pax.

## 2019-02-18 ENCOUNTER — Ambulatory Visit (INDEPENDENT_AMBULATORY_CARE_PROVIDER_SITE_OTHER): Payer: PPO | Admitting: *Deleted

## 2019-02-18 ENCOUNTER — Other Ambulatory Visit: Payer: Self-pay

## 2019-02-18 VITALS — BP 154/70 | HR 52 | Ht 73.0 in | Wt 186.8 lb

## 2019-02-18 DIAGNOSIS — R002 Palpitations: Secondary | ICD-10-CM

## 2019-02-18 NOTE — Progress Notes (Signed)
Pt came in this am for EKG Per Dr Radford Pax Per pt no complaints at this time See phone note ./cy Pt brought list of B/P's and HR's as well for review will forward to Dr Radford Pax for review .Adonis Housekeeper

## 2019-02-18 NOTE — Progress Notes (Signed)
Note   Please have him come in for EKG        02/15/19 12:00 PM Antonieta Iba, RN routed this conversation to Sueanne Margarita, MD   Antonieta Iba, RN     02/15/19 12:00 PM Note   Patient states that his heart has been beating irregularly. This started Wednesday afternoon after he had two cortisone injections in his hip that morning. Thursday afternoon he took his blood pressure which was increased: BP 181/60 HR 59; BP180/78 HR 56; BP 176/80 HR107. BP has since come down to 130s/60s. He states that his heart rate today has ranged from 56-62 bpm but that it is still irregular. He is not having any palpitations, SOB, CP, or dizziness. He states that he is feeling good and taking his medications as prescribed. Reassured patient that he is anticoagulated and that I will forward to Dr. Radford Pax.         02/15/19 11:37 AM Antonieta Iba, RN contacted [name]       02/15/19 10:57 AM Swinyer, Lanice Schwab, RN routed this conversation to Antonieta Iba, RN     [facility]  02/15/19 10:51 AM Neita Carp Doren Custard routed this conversation to [facility] Triage   Durel Salts   [facility]  02/15/19 10:51 AM Note   New Message  Patient c/o Palpitations:  High priority if patient c/o lightheadedness, shortness of breath, or chest pain  1. How long have you had palpitations/irregular HR/ Afib? Are you having the symptoms now? Wednesday evening after cortizone shot; no symptoms at moment  2. Are you currently experiencing lightheadedness, SOB or CP? No  3. Do you have a history of afib (atrial fibrillation) or irregular heart rhythm? Yes  4. Have you checked your BP or HR? (document readings if available): current 140/60; 68; yesterday evening readings were 181/60; 59  180/78; 56 176/80; 107  5. Are you experiencing any other symptoms? No   Pt wants to know if cortizone shot have anything to do with irregular heart beat and elevated blood pressure?         02/15/19  10:41 AM [name] contacted Durel Salts   Additional Documentation  Encounter Info:   Billing Info,  History,  Allergies,  Detailed Report    Orders Placed   EKG 12-Lead Medication Renewals and Changes    None   Medication List  Visit Diagnoses    Persistent atrial fibrillation ([place])

## 2019-02-19 DIAGNOSIS — D485 Neoplasm of uncertain behavior of skin: Secondary | ICD-10-CM | POA: Diagnosis not present

## 2019-02-19 DIAGNOSIS — D692 Other nonthrombocytopenic purpura: Secondary | ICD-10-CM | POA: Diagnosis not present

## 2019-02-19 DIAGNOSIS — Z85828 Personal history of other malignant neoplasm of skin: Secondary | ICD-10-CM | POA: Diagnosis not present

## 2019-02-19 DIAGNOSIS — D044 Carcinoma in situ of skin of scalp and neck: Secondary | ICD-10-CM | POA: Diagnosis not present

## 2019-02-19 DIAGNOSIS — L570 Actinic keratosis: Secondary | ICD-10-CM | POA: Diagnosis not present

## 2019-02-19 DIAGNOSIS — H61002 Unspecified perichondritis of left external ear: Secondary | ICD-10-CM | POA: Diagnosis not present

## 2019-02-19 DIAGNOSIS — L821 Other seborrheic keratosis: Secondary | ICD-10-CM | POA: Diagnosis not present

## 2019-02-19 DIAGNOSIS — B353 Tinea pedis: Secondary | ICD-10-CM | POA: Diagnosis not present

## 2019-02-25 DIAGNOSIS — M546 Pain in thoracic spine: Secondary | ICD-10-CM | POA: Diagnosis not present

## 2019-02-25 DIAGNOSIS — M9901 Segmental and somatic dysfunction of cervical region: Secondary | ICD-10-CM | POA: Diagnosis not present

## 2019-02-25 DIAGNOSIS — M9902 Segmental and somatic dysfunction of thoracic region: Secondary | ICD-10-CM | POA: Diagnosis not present

## 2019-02-25 DIAGNOSIS — M542 Cervicalgia: Secondary | ICD-10-CM | POA: Diagnosis not present

## 2019-02-26 DIAGNOSIS — E7800 Pure hypercholesterolemia, unspecified: Secondary | ICD-10-CM | POA: Diagnosis not present

## 2019-02-26 DIAGNOSIS — I4891 Unspecified atrial fibrillation: Secondary | ICD-10-CM | POA: Diagnosis not present

## 2019-02-26 DIAGNOSIS — I2510 Atherosclerotic heart disease of native coronary artery without angina pectoris: Secondary | ICD-10-CM | POA: Diagnosis not present

## 2019-02-26 DIAGNOSIS — N183 Chronic kidney disease, stage 3 unspecified: Secondary | ICD-10-CM | POA: Diagnosis not present

## 2019-02-26 DIAGNOSIS — M159 Polyosteoarthritis, unspecified: Secondary | ICD-10-CM | POA: Diagnosis not present

## 2019-02-26 DIAGNOSIS — I10 Essential (primary) hypertension: Secondary | ICD-10-CM | POA: Diagnosis not present

## 2019-02-26 DIAGNOSIS — N400 Enlarged prostate without lower urinary tract symptoms: Secondary | ICD-10-CM | POA: Diagnosis not present

## 2019-02-26 DIAGNOSIS — E785 Hyperlipidemia, unspecified: Secondary | ICD-10-CM | POA: Diagnosis not present

## 2019-02-26 DIAGNOSIS — I63512 Cerebral infarction due to unspecified occlusion or stenosis of left middle cerebral artery: Secondary | ICD-10-CM | POA: Diagnosis not present

## 2019-02-26 DIAGNOSIS — I119 Hypertensive heart disease without heart failure: Secondary | ICD-10-CM | POA: Diagnosis not present

## 2019-02-26 DIAGNOSIS — M1990 Unspecified osteoarthritis, unspecified site: Secondary | ICD-10-CM | POA: Diagnosis not present

## 2019-02-26 DIAGNOSIS — I701 Atherosclerosis of renal artery: Secondary | ICD-10-CM | POA: Diagnosis not present

## 2019-02-27 DIAGNOSIS — M546 Pain in thoracic spine: Secondary | ICD-10-CM | POA: Diagnosis not present

## 2019-02-27 DIAGNOSIS — M542 Cervicalgia: Secondary | ICD-10-CM | POA: Diagnosis not present

## 2019-02-27 DIAGNOSIS — M9902 Segmental and somatic dysfunction of thoracic region: Secondary | ICD-10-CM | POA: Diagnosis not present

## 2019-02-27 DIAGNOSIS — M9901 Segmental and somatic dysfunction of cervical region: Secondary | ICD-10-CM | POA: Diagnosis not present

## 2019-02-28 DIAGNOSIS — M9902 Segmental and somatic dysfunction of thoracic region: Secondary | ICD-10-CM | POA: Diagnosis not present

## 2019-02-28 DIAGNOSIS — M9901 Segmental and somatic dysfunction of cervical region: Secondary | ICD-10-CM | POA: Diagnosis not present

## 2019-02-28 DIAGNOSIS — M542 Cervicalgia: Secondary | ICD-10-CM | POA: Diagnosis not present

## 2019-02-28 DIAGNOSIS — M546 Pain in thoracic spine: Secondary | ICD-10-CM | POA: Diagnosis not present

## 2019-03-04 DIAGNOSIS — M542 Cervicalgia: Secondary | ICD-10-CM | POA: Diagnosis not present

## 2019-03-04 DIAGNOSIS — M9902 Segmental and somatic dysfunction of thoracic region: Secondary | ICD-10-CM | POA: Diagnosis not present

## 2019-03-04 DIAGNOSIS — M9901 Segmental and somatic dysfunction of cervical region: Secondary | ICD-10-CM | POA: Diagnosis not present

## 2019-03-04 DIAGNOSIS — M546 Pain in thoracic spine: Secondary | ICD-10-CM | POA: Diagnosis not present

## 2019-03-06 DIAGNOSIS — M542 Cervicalgia: Secondary | ICD-10-CM | POA: Diagnosis not present

## 2019-03-06 DIAGNOSIS — M9901 Segmental and somatic dysfunction of cervical region: Secondary | ICD-10-CM | POA: Diagnosis not present

## 2019-03-06 DIAGNOSIS — M9902 Segmental and somatic dysfunction of thoracic region: Secondary | ICD-10-CM | POA: Diagnosis not present

## 2019-03-06 DIAGNOSIS — M546 Pain in thoracic spine: Secondary | ICD-10-CM | POA: Diagnosis not present

## 2019-03-07 ENCOUNTER — Ambulatory Visit (HOSPITAL_COMMUNITY): Payer: PPO | Admitting: Physician Assistant

## 2019-03-11 DIAGNOSIS — M546 Pain in thoracic spine: Secondary | ICD-10-CM | POA: Diagnosis not present

## 2019-03-11 DIAGNOSIS — M9901 Segmental and somatic dysfunction of cervical region: Secondary | ICD-10-CM | POA: Diagnosis not present

## 2019-03-11 DIAGNOSIS — M9902 Segmental and somatic dysfunction of thoracic region: Secondary | ICD-10-CM | POA: Diagnosis not present

## 2019-03-11 DIAGNOSIS — M542 Cervicalgia: Secondary | ICD-10-CM | POA: Diagnosis not present

## 2019-03-12 ENCOUNTER — Ambulatory Visit (HOSPITAL_COMMUNITY)
Admission: RE | Admit: 2019-03-12 | Discharge: 2019-03-12 | Disposition: A | Discharge status: Home / Self Care | Payer: PPO | Source: Ambulatory Visit | Attending: Physician Assistant | Admitting: Physician Assistant

## 2019-03-12 ENCOUNTER — Other Ambulatory Visit: Payer: Self-pay

## 2019-03-12 ENCOUNTER — Encounter (HOSPITAL_COMMUNITY): Payer: Self-pay | Admitting: Physician Assistant

## 2019-03-12 VITALS — BP 140/58 | Ht 73.0 in | Wt 191.6 lb

## 2019-03-12 DIAGNOSIS — Z7901 Long term (current) use of anticoagulants: Secondary | ICD-10-CM | POA: Diagnosis not present

## 2019-03-12 DIAGNOSIS — Z951 Presence of aortocoronary bypass graft: Secondary | ICD-10-CM | POA: Diagnosis not present

## 2019-03-12 DIAGNOSIS — Z8679 Personal history of other diseases of the circulatory system: Secondary | ICD-10-CM | POA: Diagnosis not present

## 2019-03-12 DIAGNOSIS — Z79899 Other long term (current) drug therapy: Secondary | ICD-10-CM | POA: Diagnosis not present

## 2019-03-12 DIAGNOSIS — K5790 Diverticulosis of intestine, part unspecified, without perforation or abscess without bleeding: Secondary | ICD-10-CM | POA: Insufficient documentation

## 2019-03-12 DIAGNOSIS — Z8673 Personal history of transient ischemic attack (TIA), and cerebral infarction without residual deficits: Secondary | ICD-10-CM | POA: Diagnosis not present

## 2019-03-12 DIAGNOSIS — Z87891 Personal history of nicotine dependence: Secondary | ICD-10-CM | POA: Diagnosis not present

## 2019-03-12 DIAGNOSIS — M1389 Other specified arthritis, multiple sites: Secondary | ICD-10-CM | POA: Insufficient documentation

## 2019-03-12 DIAGNOSIS — M1990 Unspecified osteoarthritis, unspecified site: Secondary | ICD-10-CM | POA: Diagnosis not present

## 2019-03-12 DIAGNOSIS — E878 Other disorders of electrolyte and fluid balance, not elsewhere classified: Secondary | ICD-10-CM | POA: Diagnosis not present

## 2019-03-12 DIAGNOSIS — R002 Palpitations: Secondary | ICD-10-CM | POA: Diagnosis not present

## 2019-03-12 DIAGNOSIS — I2510 Atherosclerotic heart disease of native coronary artery without angina pectoris: Secondary | ICD-10-CM | POA: Diagnosis not present

## 2019-03-12 DIAGNOSIS — D6869 Other thrombophilia: Secondary | ICD-10-CM | POA: Diagnosis not present

## 2019-03-12 DIAGNOSIS — I4819 Other persistent atrial fibrillation: Secondary | ICD-10-CM

## 2019-03-12 DIAGNOSIS — E785 Hyperlipidemia, unspecified: Secondary | ICD-10-CM | POA: Insufficient documentation

## 2019-03-12 NOTE — Progress Notes (Signed)
Primary Care Physician: Josetta Huddle, MD Primary Cardiologist: Dr Radford Pax Primary Electrophysiologist: Dr Curt Bears Referring Physician: Dr Blossom Hoops is a 79 y.o. male with a history of coronary artery disease status post CABG, hypertension, hyperlipidemia, paroxysmal atrial fibrillation, carotid stenosis status post left CEA, AAA status post repair who presents for follow up in the [facility]. Patient was started on amiodarone in 2017 when he developed postoperative afib. Patient's amiodarone was stopped 11/07/18 due to bradycardia. On 12/15/18 patient reports that he started having symptoms of fatigue and weakness. Seen by Dr Radford Pax on 12/19/18 and found to be in rate controlled afib. He denies any specific triggers. He does admit to alcohol use. A sleep study is pending. He is on Eliquis for a CHADS2VASC score of 6. Patient presented for DCCV on 12/28/18 but was in [place].   On follow up today, patient reports that he had a couple days of high BP and irregular pulse after receiving steroid injections in his hips. He went in for ECG and was in SR. He denies any further episodes of palpitations. He denies any bleeding issues on anticoagulation.  Today, he denies symptoms of palpitations, chest pain, orthopnea, PND, lower extremity edema, dizziness, presyncope, syncope, snoring, daytime somnolence, bleeding, or neurologic sequela. The patient is tolerating medications without difficulties and is otherwise without complaint today.    Atrial Fibrillation Risk Factors:  he does have symptoms or diagnosis of sleep apnea. Sleep study pending. he does not have a history of rheumatic fever. he does have a history of alcohol use. The patient does not have a history of early familial atrial fibrillation or other arrhythmias.  he has a BMI of Body mass index is 25.28 kg/m.[name] Filed Weights   03/12/19 0940  Weight: 86.9 kg    Family History  Problem Relation  Age of Onset  . Heart disease Mother        AAA  Before age 20  . Cancer Mother 58       Colon cancer  . Hyperlipidemia Mother   . Heart failure Father      Atrial Fibrillation Management history:  Previous antiarrhythmic drugs: amiodarone Previous cardioversions: none Previous ablations: none CHADS2VASC score: 6 Anticoagulation history: Eliquis   Past Medical History:  Diagnosis Date  . Arthritis    HANDS AND FEET  . Asymptomatic carotid artery stenosis BILATERAL   PER DOPPLER  03/2016  RICA  1 - 39%/  s/p  L CEA  1-39%- followed by Dr. Donnetta Hutching  . CAD (coronary artery disease)    s/p remote stenting, s/p CABG with LIMA to LAD, SVG to RCA and SVG to LCx 12/2015  . Cataract immature BILATERAL   . Diverticulosis   . 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 History of diverticulosis    Noted on Colonoscopy  . History of inguinal herniorrhaphy   . Hydrocele, left   . Hyperlipidemia   . Hypertension   . Increased intraocular pressure   . Nocturia   . Paroxysmal atrial fibrillation ([place]) 06/30/14   chad2vasc score is at least 4  . 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 Retinal detachment   . Stroke [facility])    Past Surgical History:  Procedure Laterality Date  . AAA REPAIR W/ AORTOBIFEMORAL BYPASS  OCT  42595  . ABDOMINAL HERNIA REPAIR  X4  (LAST ONE 1990'S)   inguinal herniorrhaphy  . CARDIAC CATHETERIZATION N/A  12/16/2015   Procedure: Left Heart Cath and Coronary Angiography;  Surgeon: Wellington Hampshire, MD;  Location: [facility];  Service: Cardiovascular;  Laterality: N/A;  . CAROTID ENDARTERECTOMY    . CARPAL TUNNEL RELEASE Right Oct. 2013   Hand  . CORONARY ANGIOPLASTY  2002   PTCA  W/ X2 BM STENTS--   PROXIMA LAD  &  LEFT CIRCUMFLEX TO FIRST OBTUSE MARGINAL BRANCH  . CORONARY ARTERY BYPASS GRAFT N/A 12/18/2015   Procedure: CORONARY ARTERY BYPASS GRAFTING (CABG) x 3, using left internal mammary artery and bilateral    leg greater saphenous vein harvested endoscopically  LIMA to LAD, SVG to Circumflex, SVG to RCA;  Surgeon: Grace Isaac, MD;  Location: [place];  Service: Open Heart Surgery;  Laterality: N/A;  . ENDARTERECTOMY Left 03/11/2015   Procedure: ENDARTERECTOMY LEFT CAROTID;  Surgeon: Rosetta Posner, MD;  Location: [place];  Service: Vascular;  Laterality: Left;  . EXPLORATORY LAPAROTOMY W/ ENTEROLYSIS FOR PARTIAL SBO  09-23-2008  . heart stents  02/19/00  . HYDROCELE EXCISION  01/24/2011   Procedure: HYDROCELECTOMY ADULT;  Surgeon: Bernestine Amass, MD;  Location: [facility];  Service: Urology;  Laterality: Left;  . Ingrown Toe Nail Right Jan. 2015   Right Great Toe nail  . intest blockage  09/22/08  . PATCH ANGIOPLASTY Left 03/11/2015   Procedure: PATCH ANGIOPLASTY LEFT CAROTID USING HEMASHIELD PLATINUM FINESSE PATCH;  Surgeon: Rosetta Posner, MD;  Location: [facility];  Service: Vascular;  Laterality: Left;  . PULLEY RELEASE -- RIGHT 5TH FINGER  2009  . RETINAL DETACHMENT SURGERY  2004   BILATERAL  . TEE WITHOUT CARDIOVERSION N/A 12/18/2015   Procedure: TRANSESOPHAGEAL ECHOCARDIOGRAM (TEE);  Surgeon: Grace Isaac, MD;  Location: [place];  Service: Open Heart Surgery;  Laterality: N/A;  . TOOTH EXTRACTION  Dec 2013    Current Outpatient Medications  Medication Sig Dispense Refill  . acetaminophen (TYLENOL) 500 MG tablet Take 2 tablets (1,000 mg total) by mouth every 6 (six) hours as needed. (Patient taking differently: Take 1,000 mg by mouth every 6 (six) hours as needed for headache. ) 30 tablet 0  . amLODipine (NORVASC) 10 MG tablet Take 10 mg by mouth daily.     [name] apixaban (ELIQUIS) 5 MG TABS tablet Take 1 tablet (5 mg total) by mouth 2 (two) times daily. 60 tablet 6  . Apoaequorin (PREVAGEN) 10 MG CAPS Take 10 mg by mouth daily.    [name] b complex vitamins tablet Take 1 tablet by mouth daily.      . calcium carbonate (OS-CAL) 600 MG TABS Take 600 mg by mouth daily.      . Cholecalciferol  1000 UNITS capsule Take 2,000 Units by mouth daily.     . cloNIDine (CATAPRES) 0.1 MG tablet Take 0.1 mg by mouth at bedtime as needed.   5  . Coenzyme Q10 (CO Q 10) 100 MG CAPS Take 200 mg by mouth daily.     . cyanocobalamin 1000 MCG tablet Take 1,000 mcg by mouth daily.    [name] EPINEPHrine (EPIPEN) 0.3 mg/0.3 mL SOAJ injection Inject 0.3 mg into the muscle daily as needed for anaphylaxis (allergic reaction).     . finasteride (PROSCAR) 5 MG tablet Take 5 mg by mouth daily.    . hydrALAZINE (APRESOLINE) 25 MG tablet Take 25 mg by mouth 3 (three) times daily.    [name] losartan (COZAAR) 50 MG tablet Take 1 tablet (50 mg total) by  mouth daily. (Patient taking differently: Take 50 mg by mouth 2 (two) times daily. ) 90 tablet 3  . Magnesium Hydroxide (MAGNESIA PO) Take 1,000 mg by mouth daily.     . Multiple Vitamin (MULTIVITAMIN) tablet Take 1 tablet by mouth daily.      . Omega-3 Fatty Acids (FISH OIL PO) Take 600 mg by mouth 2 (two) times daily.     [name] OVER THE COUNTER MEDICATION Take 0.5 Applicatorfuls by mouth daily. CBD oil    . potassium gluconate 595 (99 K) MG TABS tablet Take 595 mg by mouth.    . rosuvastatin (CRESTOR) 40 MG tablet Take 1 tablet (40 mg total) by mouth every evening. 90 tablet 3  . Tamsulosin HCl (FLOMAX) 0.4 MG CAPS Take 0.8 mg by mouth at bedtime.     . vitamin C (ASCORBIC ACID) 500 MG tablet Take 2,000 mg by mouth daily.     Current Facility-Administered Medications  Medication Dose Route Frequency Provider Last Rate Last Admin  . triamcinolone acetonide (KENALOG) 10 MG/ML injection 10 mg  10 mg Other Once Wallene Huh, DPM        Allergies  Allergen Reactions  . Wasp Venom Shortness Of Breath and Swelling  . Other     Social History   Socioeconomic History  . Marital status: Married    Spouse name: Not on file  . Number of children: Not on file  . Years of education: Not on file  . Highest education level: Not on file  Occupational History  . Not on file    Tobacco Use  . Smoking status: Former Smoker    Years: 30.00    Types: Cigarettes    Quit date: 01/18/1978    Years since quitting: 41.1  . Smokeless tobacco: Former Systems developer    Quit date: 1980  Substance and Sexual Activity  . Alcohol use: Yes    Alcohol/week: 2.0 standard drinks    Types: 2 Shots of liquor per week    Comment: 2 oz of vodka per day  . Drug use: No  . Sexual activity: Not on file  Other Topics Concern  . Not on file  Social History Narrative   Pt lives in [place] with spouse.  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   Social Determinants of Health   Financial Resource Strain:   . Difficulty of Paying Living Expenses: Not on file  Food Insecurity:   . Worried About Charity fundraiser in the Last Year: Not on file  . Ran Out of Food in the Last Year: Not on file  Transportation Needs:   . Lack of Transportation (Medical): Not on file  . Lack of Transportation (Non-Medical): Not on file  Physical Activity:   . Days of Exercise per Week: Not on file  . Minutes of Exercise per Session: Not on file  Stress:   . Feeling of Stress : Not on file  Social Connections:   . Frequency of Communication with Friends and Family: Not on file  . Frequency of Social Gatherings with Friends and Family: Not on file  . Attends Religious Services: Not on file  . Active Member of Clubs or Organizations: Not on file  . Attends Archivist Meetings: Not on file  . Marital Status: Not on file  Intimate Partner Violence:   . Fear of Current or Ex-Partner: Not on file  . Emotionally Abused: Not on file  . Physically Abused: Not on  file  . Sexually Abused: Not on file     ROS- All systems are reviewed and negative except as per the HPI above.  Physical Exam: Vitals:   03/12/19 0940  BP: (!) 140/58  Weight: 86.9 kg  Height: 6\' 1"  (1.854 m)    GEN- The patient is well appearing elderly male, alert and oriented x 3 today.   HEENT-head  normocephalic, atraumatic, sclera clear, conjunctiva pink, hearing intact, trachea midline. Lungs- Clear to ausculation bilaterally, normal work of breathing Heart- Regular rate and rhythm, no murmurs, rubs or gallops  GI- soft, NT, ND, + BS Extremities- no clubbing, cyanosis, or edema MS- no significant deformity or atrophy Skin- no rash or lesion Psych- euthymic mood, full affect Neuro- strength and sensation are intact   Wt Readings from Last 3 Encounters:  03/12/19 86.9 kg  02/18/19 84.7 kg  01/06/19 82.6 kg    EKG is not ordered today. Patient refused.   Echo 01/23/19 demonstrated  Left ventricular ejection fraction, by visual estimation, is 60 to 65%. The left ventricle has normal function. There is mildly increased left ventricular hypertrophy. 2. Left ventricular diastolic parameters are indeterminate. 3. Right ventricular volume/pressure overload. 4. The left ventricle has no regional wall motion abnormalities. 5. Global right ventricle has mildly reduced systolic function.The right ventricular size is mildly enlarged. No increase in right ventricular wall thickness. 6. Left atrial size was moderately dilated. 7. Right atrial size was moderately dilated. 8. The mitral valve is normal in structure. Mild mitral valve regurgitation. 9. The tricuspid valve is normal in structure. 10. The aortic valve is tricuspid. Aortic valve regurgitation is trivial. 11. The pulmonic valve was grossly normal. Pulmonic valve regurgitation is trivial. 12. Mildly elevated pulmonary artery systolic pressure. 13. The atrial septum is grossly normal. 14. The average left ventricular global longitudinal strain is -19.8 %.  Epic records are reviewed at length today  Assessment and Plan:  1. Persistent atrial fibrillation/atrial tach Patient has failed amiodarone due to bradycardia.  Patient scheduled for DCCV but presented in SR. Patient appears to be maintaining SR clinically. He may be a  good candidate for dofetilide if his afib becomes more persistent. For now, patient prefers to continue his current therapy.  Continue Eliquis 5 mg BID.   This patients CHA2DS2-VASc Score and unadjusted Ischemic Stroke Rate (% per year) is equal to 9.7 % stroke rate/year from a score of 6  Above score calculated as 1 point each if present [CHF, HTN, DM, Vascular=MI/PAD/Aortic Plaque, Age if 65-74, or Male] Above score calculated as 2 points each if present [Age > 75, or Stroke/TIA/TE]   2. CAD s/p CABG with LIMA to LAD, SVG to RCA and SVG to LCx 12/2015. No anginal symptoms. Followed by Dr Radford Pax.  3. HTN Stable, no changes today.   Follow up with Dr Radford Pax as scheduled. AF clinic as needed.    [facility] [address] [place], [place] [address] [phone] 03/12/2019 10:25 AM

## 2019-03-15 ENCOUNTER — Telehealth: Payer: Self-pay | Admitting: *Deleted

## 2019-03-15 NOTE — Telephone Encounter (Signed)

## 2019-03-18 DIAGNOSIS — M9902 Segmental and somatic dysfunction of thoracic region: Secondary | ICD-10-CM | POA: Diagnosis not present

## 2019-03-18 DIAGNOSIS — M9901 Segmental and somatic dysfunction of cervical region: Secondary | ICD-10-CM | POA: Diagnosis not present

## 2019-03-18 DIAGNOSIS — M542 Cervicalgia: Secondary | ICD-10-CM | POA: Diagnosis not present

## 2019-03-18 DIAGNOSIS — M546 Pain in thoracic spine: Secondary | ICD-10-CM | POA: Diagnosis not present

## 2019-03-19 NOTE — Progress Notes (Signed)
Virtual Visit via Telephone Note   This visit type was conducted due to national recommendations for restrictions regarding the COVID-19 Pandemic (e.g. social distancing) in an effort to limit this patient's exposure and mitigate transmission in our community.  Due to his co-morbid illnesses, this patient is at least at moderate risk for complications without adequate follow up.  This format is felt to be most appropriate for this patient at this time.  All issues noted in this document were discussed and addressed.  A limited physical exam was performed with this format.  Please refer to the patient's chart for his consent to telehealth for [facility].   Evaluation Performed:  Follow-up visit  This visit type was conducted due to national recommendations for restrictions regarding the COVID-19 Pandemic (e.g. social distancing).  This format is felt to be most appropriate for this patient at this time.  All issues noted in this document were discussed and addressed.  No physical exam was performed (except for noted visual exam findings with Video Visits).  Please refer to the patient's chart (MyChart message for video visits and phone note for telephone visits) for the patient's consent to telehealth for [facility].  Date:  03/20/2019   ID:  [name], DOB 1940/08/20, MRN [id number]  Patient Location:  Home  Provider location:   [place]  PCP:  Josetta Huddle, MD  Cardiologist:  Fransico Him, MD  Electrophysiologist:  None   Chief Complaint:  Atrial fibrillation, HTN, CAD, HLD  History of Present Illness:    [name] is a 79 y.o. male who presents via audio/video conferencing for a telehealth visit today.    [name] is a 79 y.o. male with a hx of CAD (s/p CABG with LIMA to LAD, SVG to RCA and SVG to LCx 12/2015), HTN, dyslipidemia,PAF on chronic anticoagulation with Eliquis, carotid artery stenosis s/p L CEA, AAA s/p repair and followed by Dr. Donnetta Hutching.  He has a hx of nocturnal bradycardia and nonsustained atrial tachycardia and sleep study was recommended by  Dr. Curt Bears.  He underwent PSG on 01/06/2019 and showed no significant OSA with an AHI of 0.2/hr with no significant oxygen desaturations.    He was seen in afib clinic recently due to noting irregularities in his heart beating when he takes his BP but he is unaware of it.  This occurred more frequently after he received a steroid injection.  He went in for an EKG and showed NSR. He was seen back in afib clinic and was maintaining NSR.  It was felt that he would be a good candidate for dofetilide if afib became more persistent.  His therapy was not changed at that office visit.   He is here today for followup and is doing well.  He denies any chest pain or pressure, SOB, DOE, PND, orthopnea, LE edema, dizziness, palpitations or syncope. He is compliant with his meds and is tolerating meds with no SE.    The patient does not have symptoms concerning for COVID-19 infection (fever, chills, cough, or new shortness of breath).    Prior CV studies:   The following studies were reviewed today:  Sleep study, EKG, office notes from afib clinic  Past Medical History:  Diagnosis Date  . Arthritis    HANDS AND FEET  . Asymptomatic carotid artery stenosis BILATERAL   PER DOPPLER  03/2016  RICA  1 - 39%/  s/p  L CEA  1-39%- followed by Dr. Donnetta Hutching  .  CAD (coronary artery disease)    s/p remote stenting, s/p CABG with LIMA to LAD, SVG to RCA and SVG to LCx 12/2015  . Cataract immature BILATERAL   . Diverticulosis   . 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 History of diverticulosis    Noted on Colonoscopy  . History of inguinal herniorrhaphy   . Hydrocele, left   . Hyperlipidemia   . Hypertension   . Increased intraocular pressure   . Nocturia   . Paroxysmal atrial fibrillation ([place]) 06/30/14   chad2vasc score is at least 4  . 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 Retinal detachment   . Stroke [facility])    Past Surgical History:  Procedure Laterality Date  . AAA REPAIR W/ AORTOBIFEMORAL BYPASS  OCT  43329  . ABDOMINAL HERNIA REPAIR  X4  (LAST ONE 1990'S)   inguinal herniorrhaphy  . CARDIAC CATHETERIZATION N/A 12/16/2015   Procedure: Left Heart Cath and Coronary Angiography;  Surgeon: Wellington Hampshire, MD;  Location: [facility];  Service: Cardiovascular;  Laterality: N/A;  . CAROTID ENDARTERECTOMY    . CARPAL TUNNEL RELEASE Right Oct. 2013   Hand  . CORONARY ANGIOPLASTY  2002   PTCA  W/ X2 BM STENTS--   PROXIMA LAD  &  LEFT CIRCUMFLEX TO FIRST OBTUSE MARGINAL BRANCH  . CORONARY ARTERY BYPASS GRAFT N/A 12/18/2015   Procedure: CORONARY ARTERY BYPASS GRAFTING (CABG) x 3, using left internal mammary artery and bilateral   leg greater saphenous vein harvested endoscopically  LIMA to LAD, SVG to Circumflex, SVG to RCA;  Surgeon: Grace Isaac, MD;  Location: [place];  Service: Open Heart Surgery;  Laterality: N/A;  . ENDARTERECTOMY Left 03/11/2015   Procedure: ENDARTERECTOMY LEFT CAROTID;  Surgeon: Rosetta Posner, MD;  Location: [place];  Service: Vascular;  Laterality: Left;  . EXPLORATORY LAPAROTOMY W/ ENTEROLYSIS FOR PARTIAL SBO  09-23-2008  . heart stents  02/19/00  . HYDROCELE EXCISION  01/24/2011   Procedure: HYDROCELECTOMY ADULT;  Surgeon: Bernestine Amass, MD;  Location[facility];  Service: Urology;  Laterality: Left;  . Ingrown Toe Nail Right Jan. 2015   Right Great Toe nail  . intest blockage  09/22/08  . PATCH ANGIOPLASTY Left 03/11/2015   Procedure: PATCH ANGIOPLASTY LEFT CAROTID USING HEMASHIELD PLATINUM FINESSE PATCH;  Surgeon: Rosetta Posner, MD;  Location: [place];  Service: Vascular;  Laterality: Left;  . PULLEY RELEASE -- RIGHT 5TH FINGER  2009  . RETINAL DETACHMENT SURGERY  2004   BILATERAL  . TEE WITHOUT CARDIOVERSION N/A 12/18/2015   Procedure: TRANSESOPHAGEAL ECHOCARDIOGRAM (TEE);  Surgeon: Grace Isaac, MD;  Location: [place];  Service: Open Heart Surgery;  Laterality: N/A;  . TOOTH EXTRACTION  Dec 2013     Current Meds  Medication Sig  . acetaminophen (TYLENOL) 500 MG tablet Take 2 tablets (1,000 mg total) by mouth every 6 (six) hours as needed. (Patient taking differently: Take 1,000 mg by mouth every 6 (six) hours as needed for headache. )  . amLODipine (NORVASC) 10 MG tablet Take 10 mg by mouth daily.   [name] apixaban (ELIQUIS) 5 MG TABS tablet Take 1 tablet (5 mg total) by mouth 2 (two) times daily.  [name] Apoaequorin (PREVAGEN) 10 MG CAPS Take 10 mg by mouth daily.  [name] b complex vitamins tablet Take 1 tablet by mouth daily.    . calcium carbonate (OS-CAL) 600 MG TABS Take  600 mg by mouth daily.    . Cholecalciferol 1000 UNITS capsule Take 2,000 Units by mouth daily.   . cloNIDine (CATAPRES) 0.1 MG tablet Take 0.1 mg by mouth at bedtime as needed.   . Coenzyme Q10 (CO Q 10) 100 MG CAPS Take 200 mg by mouth daily.   . cyanocobalamin 1000 MCG tablet Take 1,000 mcg by mouth daily.  [name] EPINEPHrine (EPIPEN) 0.3 mg/0.3 mL SOAJ injection Inject 0.3 mg into the muscle daily as needed for anaphylaxis (allergic reaction).   . finasteride (PROSCAR) 5 MG tablet Take 5 mg by mouth daily.  . hydrALAZINE (APRESOLINE) 25 MG tablet Take 25 mg by mouth 3 (three) times daily.  [name] losartan (COZAAR) 50 MG tablet Take 1 tablet (50 mg total) by mouth daily. (Patient taking differently: Take 50 mg by mouth 2 (two) times daily. )  . Magnesium Hydroxide (MAGNESIA PO) Take 1,000 mg by mouth daily.   . Multiple Vitamin (MULTIVITAMIN) tablet Take 1 tablet by mouth daily.    . Omega-3 Fatty Acids (FISH OIL PO) Take 600 mg by mouth 2 (two) times daily.   [name] OVER THE COUNTER MEDICATION Take 0.5 Applicatorfuls by mouth daily. CBD oil  . potassium gluconate 595 (99 K) MG TABS tablet Take 595 mg by mouth.  . rosuvastatin (CRESTOR) 40 MG tablet Take 1 tablet (40 mg total) by mouth every evening.  . Tamsulosin HCl (FLOMAX)  0.4 MG CAPS Take 0.8 mg by mouth at bedtime.   . vitamin C (ASCORBIC ACID) 500 MG tablet Take 2,000 mg by mouth daily.   Current Facility-Administered Medications for the 03/20/19 encounter (Telemedicine) with Sueanne Margarita, MD  Medication  . triamcinolone acetonide (KENALOG) 10 MG/ML injection 10 mg     Allergies:   Wasp venom and Other   Social History   Tobacco Use  . Smoking status: Former Smoker    Years: 30.00    Types: Cigarettes    Quit date: 01/18/1978    Years since quitting: 41.1  . Smokeless tobacco: Former Systems developer    Quit date: 1980  Substance Use Topics  . Alcohol use: Yes    Alcohol/week: 2.0 standard drinks    Types: 2 Shots of liquor per week    Comment: 2 oz of vodka per day  . Drug use: No     Family Hx: The patient's family history includes Cancer (age of onset: 13) in his mother; Heart disease in his mother; Heart failure in his father; Hyperlipidemia in his mother.  ROS:   Please see the history of present illness.     All other systems reviewed and are negative.   Labs/Other Tests and Data Reviewed:    Recent Labs: 12/19/2018: BUN 28; Creatinine, Ser 1.29; Hemoglobin 13.1; Platelets 185; Potassium 4.4; Sodium 140   Recent Lipid Panel Lab Results  Component Value Date/Time   CHOL 155 11/08/2017 10:02 AM   CHOL 164 05/24/2013 09:16 AM   TRIG 27 11/08/2017 10:02 AM   TRIG 50 05/24/2013 09:16 AM   HDL 90 11/08/2017 10:02 AM   HDL 84 05/24/2013 09:16 AM   CHOLHDL 1.7 11/08/2017 10:02 AM   CHOLHDL 2.4 12/16/2015 02:18 AM   LDLCALC 60 11/08/2017 10:02 AM   LDLCALC 70 05/24/2013 09:16 AM    Wt Readings from Last 3 Encounters:  03/20/19 179 lb (81.2 kg)  03/12/19 191 lb 9.6 oz (86.9 kg)  02/18/19 186 lb 12.8 oz (84.7 kg)     Objective:    Vital  Signs:  BP 137/61   Pulse 66   Ht 6\' 1"  (1.854 m)   Wt 179 lb (81.2 kg)   BMI 23.62 kg/m     ASSESSMENT & PLAN:    1.  Persistent atrial fibrillation -maintaining NSR with occasional  irregularity noted on BP checks but EKGs have shown NSR -no BB due to bradycardia - amio also stopped due to this -continue apixaban 5mg  BID - denies any bleeding problems -creatinine was stable on outside labs from PCP (creatinine 1.29 and Hbg 13.1 on 12/19/2018)  2.  ASCAD -s/p CABG with LIMA to LAD, SVG to RCA and SVG to LCx 12/2015 -he denies any anginal sx -continue statin -no BB due to bradycardia -no ASA due to DOAC  3.  HLD -LDL goal < 70 -LDL was 60 on outside labs from PCP on KPN on 06/2018 -continue Crestor 40mg  daily  4.  HTN -BP controlled -continue Amlodipine 10mg  daily, Clonidine 0.1mg  qhs PRN, Losartan 50mg  daily, Hydralazine 25mg  TID  5.  Right carotid artery stenosis/PVD LE -s/p LCEA -dopplers 2018 with 1-39% right carotid stenosis -followed by Dr. Donnetta Hutching -continue statin  6.  Nocturnal Bradycardia -sleep study showed no sleep apnea -asymptomatic  7.  Cardiac clearance -he is having knee surgery due to severe DJD of knee -he is able to walk several blocks on flat surface and plays 18 holes of golf without SOB or CP -his Revised cardiac risk Index is 2 points corresponding to a periop risk of major cardiac event of 6.6% which is moderate risk and not prohibitive.   COVID-19 Education: The signs and symptoms of COVID-19 were discussed with the patient and how to seek care for testing (follow up with PCP or arrange E-visit).  The importance of social distancing was discussed today.  Patient Risk:   After full review of this patient's clinical status, I feel that they are at least moderate risk at this time.  Time:   Today, I have spent 20 minutes on telemedicine discussing medical problems including PAF, HTN, carotid stenosis, CAD, HLD and reviewing patient's chart including outside labs from PCP, 2D echo, carotid dopplers, LE arterial dopplers.  Medication Adjustments/Labs and Tests Ordered: Current medicines are reviewed at length with the patient today.   Concerns regarding medicines are outlined above.  Tests Ordered: No orders of the defined types were placed in this encounter.  Medication Changes: No orders of the defined types were placed in this encounter.   Disposition:  Follow up in 6 month(s)  Signed, Fransico Him, MD  03/20/2019 10:35 AM    [place]

## 2019-03-20 ENCOUNTER — Other Ambulatory Visit: Payer: Self-pay

## 2019-03-20 ENCOUNTER — Encounter: Payer: Self-pay | Admitting: Cardiology

## 2019-03-20 ENCOUNTER — Telehealth (INDEPENDENT_AMBULATORY_CARE_PROVIDER_SITE_OTHER): Payer: PPO | Admitting: Cardiology

## 2019-03-20 VITALS — BP 137/61 | HR 66 | Ht 73.0 in | Wt 179.0 lb

## 2019-03-20 DIAGNOSIS — M546 Pain in thoracic spine: Secondary | ICD-10-CM | POA: Diagnosis not present

## 2019-03-20 DIAGNOSIS — M9901 Segmental and somatic dysfunction of cervical region: Secondary | ICD-10-CM | POA: Diagnosis not present

## 2019-03-20 DIAGNOSIS — Z01818 Encounter for other preprocedural examination: Secondary | ICD-10-CM | POA: Diagnosis not present

## 2019-03-20 DIAGNOSIS — I4819 Other persistent atrial fibrillation: Secondary | ICD-10-CM

## 2019-03-20 DIAGNOSIS — I10 Essential (primary) hypertension: Secondary | ICD-10-CM | POA: Diagnosis not present

## 2019-03-20 DIAGNOSIS — E7800 Pure hypercholesterolemia, unspecified: Secondary | ICD-10-CM | POA: Diagnosis not present

## 2019-03-20 DIAGNOSIS — M542 Cervicalgia: Secondary | ICD-10-CM | POA: Diagnosis not present

## 2019-03-20 DIAGNOSIS — M1710 Unilateral primary osteoarthritis, unspecified knee: Secondary | ICD-10-CM | POA: Diagnosis not present

## 2019-03-20 DIAGNOSIS — I2510 Atherosclerotic heart disease of native coronary artery without angina pectoris: Secondary | ICD-10-CM

## 2019-03-20 DIAGNOSIS — I6521 Occlusion and stenosis of right carotid artery: Secondary | ICD-10-CM | POA: Diagnosis not present

## 2019-03-20 DIAGNOSIS — I779 Disorder of arteries and arterioles, unspecified: Secondary | ICD-10-CM | POA: Diagnosis not present

## 2019-03-20 DIAGNOSIS — M9902 Segmental and somatic dysfunction of thoracic region: Secondary | ICD-10-CM | POA: Diagnosis not present

## 2019-03-20 NOTE — Patient Instructions (Signed)
Medication Instructions:  Your physician recommends that you continue on your current medications as directed. Please refer to the Current Medication list given to you today.  *If you need a refill on your cardiac medications before your next appointment, please call your pharmacy*2  Follow-Up: At [facility], you and your health needs are our priority.  As part of our continuing mission to provide you with exceptional heart care, we have created designated Provider Care Teams.  These Care Teams include your primary Cardiologist (physician) and Advanced Practice Providers (APPs -  Physician Assistants and Nurse Practitioners) who all work together to provide you with the care you need, when you need it.   Your next appointment:   6 month(s)  The format for your next appointment:   Either In Person or Virtual  Provider:   Fransico Him, MD

## 2019-03-21 DIAGNOSIS — M546 Pain in thoracic spine: Secondary | ICD-10-CM | POA: Diagnosis not present

## 2019-03-21 DIAGNOSIS — M9901 Segmental and somatic dysfunction of cervical region: Secondary | ICD-10-CM | POA: Diagnosis not present

## 2019-03-21 DIAGNOSIS — M542 Cervicalgia: Secondary | ICD-10-CM | POA: Diagnosis not present

## 2019-03-21 DIAGNOSIS — M9902 Segmental and somatic dysfunction of thoracic region: Secondary | ICD-10-CM | POA: Diagnosis not present

## 2019-03-22 DIAGNOSIS — M25551 Pain in right hip: Secondary | ICD-10-CM | POA: Diagnosis not present

## 2019-03-22 DIAGNOSIS — M25552 Pain in left hip: Secondary | ICD-10-CM | POA: Diagnosis not present

## 2019-03-27 DIAGNOSIS — M9902 Segmental and somatic dysfunction of thoracic region: Secondary | ICD-10-CM | POA: Diagnosis not present

## 2019-03-27 DIAGNOSIS — M542 Cervicalgia: Secondary | ICD-10-CM | POA: Diagnosis not present

## 2019-03-27 DIAGNOSIS — M546 Pain in thoracic spine: Secondary | ICD-10-CM | POA: Diagnosis not present

## 2019-03-27 DIAGNOSIS — M9901 Segmental and somatic dysfunction of cervical region: Secondary | ICD-10-CM | POA: Diagnosis not present

## 2019-04-09 DIAGNOSIS — H903 Sensorineural hearing loss, bilateral: Secondary | ICD-10-CM | POA: Diagnosis not present

## 2019-04-30 DIAGNOSIS — H903 Sensorineural hearing loss, bilateral: Secondary | ICD-10-CM | POA: Diagnosis not present

## 2019-05-02 DIAGNOSIS — I10 Essential (primary) hypertension: Secondary | ICD-10-CM | POA: Diagnosis not present

## 2019-05-02 DIAGNOSIS — I63512 Cerebral infarction due to unspecified occlusion or stenosis of left middle cerebral artery: Secondary | ICD-10-CM | POA: Diagnosis not present

## 2019-05-02 DIAGNOSIS — E785 Hyperlipidemia, unspecified: Secondary | ICD-10-CM | POA: Diagnosis not present

## 2019-05-02 DIAGNOSIS — M170 Bilateral primary osteoarthritis of knee: Secondary | ICD-10-CM | POA: Diagnosis not present

## 2019-05-02 DIAGNOSIS — N400 Enlarged prostate without lower urinary tract symptoms: Secondary | ICD-10-CM | POA: Diagnosis not present

## 2019-05-02 DIAGNOSIS — I4891 Unspecified atrial fibrillation: Secondary | ICD-10-CM | POA: Diagnosis not present

## 2019-05-02 DIAGNOSIS — M159 Polyosteoarthritis, unspecified: Secondary | ICD-10-CM | POA: Diagnosis not present

## 2019-05-02 DIAGNOSIS — N183 Chronic kidney disease, stage 3 unspecified: Secondary | ICD-10-CM | POA: Diagnosis not present

## 2019-05-02 DIAGNOSIS — E7800 Pure hypercholesterolemia, unspecified: Secondary | ICD-10-CM | POA: Diagnosis not present

## 2019-05-02 DIAGNOSIS — I119 Hypertensive heart disease without heart failure: Secondary | ICD-10-CM | POA: Diagnosis not present

## 2019-05-02 DIAGNOSIS — I701 Atherosclerosis of renal artery: Secondary | ICD-10-CM | POA: Diagnosis not present

## 2019-05-02 DIAGNOSIS — I2510 Atherosclerotic heart disease of native coronary artery without angina pectoris: Secondary | ICD-10-CM | POA: Diagnosis not present

## 2019-06-12 DIAGNOSIS — Z85828 Personal history of other malignant neoplasm of skin: Secondary | ICD-10-CM | POA: Diagnosis not present

## 2019-06-12 DIAGNOSIS — L821 Other seborrheic keratosis: Secondary | ICD-10-CM | POA: Diagnosis not present

## 2019-06-12 DIAGNOSIS — L570 Actinic keratosis: Secondary | ICD-10-CM | POA: Diagnosis not present

## 2019-06-12 DIAGNOSIS — D485 Neoplasm of uncertain behavior of skin: Secondary | ICD-10-CM | POA: Diagnosis not present

## 2019-06-21 DIAGNOSIS — M1612 Unilateral primary osteoarthritis, left hip: Secondary | ICD-10-CM | POA: Diagnosis not present

## 2019-06-21 DIAGNOSIS — M25551 Pain in right hip: Secondary | ICD-10-CM | POA: Diagnosis not present

## 2019-07-03 DIAGNOSIS — M25552 Pain in left hip: Secondary | ICD-10-CM | POA: Diagnosis not present

## 2019-07-09 ENCOUNTER — Other Ambulatory Visit: Payer: Self-pay | Admitting: Internal Medicine

## 2019-07-09 ENCOUNTER — Ambulatory Visit
Admission: RE | Admit: 2019-07-09 | Discharge: 2019-07-09 | Disposition: A | Discharge status: Home / Self Care | Payer: PPO | Source: Ambulatory Visit | Attending: Internal Medicine | Admitting: Internal Medicine

## 2019-07-09 DIAGNOSIS — E7800 Pure hypercholesterolemia, unspecified: Secondary | ICD-10-CM | POA: Diagnosis not present

## 2019-07-09 DIAGNOSIS — M170 Bilateral primary osteoarthritis of knee: Secondary | ICD-10-CM | POA: Diagnosis not present

## 2019-07-09 DIAGNOSIS — Z0001 Encounter for general adult medical examination with abnormal findings: Secondary | ICD-10-CM | POA: Diagnosis not present

## 2019-07-09 DIAGNOSIS — I10 Essential (primary) hypertension: Secondary | ICD-10-CM | POA: Diagnosis not present

## 2019-07-09 DIAGNOSIS — R202 Paresthesia of skin: Secondary | ICD-10-CM | POA: Diagnosis not present

## 2019-07-09 DIAGNOSIS — M1990 Unspecified osteoarthritis, unspecified site: Secondary | ICD-10-CM | POA: Diagnosis not present

## 2019-07-09 DIAGNOSIS — I4891 Unspecified atrial fibrillation: Secondary | ICD-10-CM | POA: Diagnosis not present

## 2019-07-09 DIAGNOSIS — R1031 Right lower quadrant pain: Secondary | ICD-10-CM

## 2019-07-09 DIAGNOSIS — Z79899 Other long term (current) drug therapy: Secondary | ICD-10-CM | POA: Diagnosis not present

## 2019-07-09 DIAGNOSIS — Z1389 Encounter for screening for other disorder: Secondary | ICD-10-CM | POA: Diagnosis not present

## 2019-07-09 DIAGNOSIS — E559 Vitamin D deficiency, unspecified: Secondary | ICD-10-CM | POA: Diagnosis not present

## 2019-07-09 DIAGNOSIS — R109 Unspecified abdominal pain: Secondary | ICD-10-CM | POA: Diagnosis not present

## 2019-07-09 DIAGNOSIS — Z8673 Personal history of transient ischemic attack (TIA), and cerebral infarction without residual deficits: Secondary | ICD-10-CM | POA: Diagnosis not present

## 2019-07-09 DIAGNOSIS — N183 Chronic kidney disease, stage 3 unspecified: Secondary | ICD-10-CM | POA: Diagnosis not present

## 2019-08-06 DIAGNOSIS — I701 Atherosclerosis of renal artery: Secondary | ICD-10-CM | POA: Diagnosis not present

## 2019-08-06 DIAGNOSIS — I4891 Unspecified atrial fibrillation: Secondary | ICD-10-CM | POA: Diagnosis not present

## 2019-08-06 DIAGNOSIS — M1990 Unspecified osteoarthritis, unspecified site: Secondary | ICD-10-CM | POA: Diagnosis not present

## 2019-08-06 DIAGNOSIS — N400 Enlarged prostate without lower urinary tract symptoms: Secondary | ICD-10-CM | POA: Diagnosis not present

## 2019-08-06 DIAGNOSIS — M159 Polyosteoarthritis, unspecified: Secondary | ICD-10-CM | POA: Diagnosis not present

## 2019-08-06 DIAGNOSIS — N183 Chronic kidney disease, stage 3 unspecified: Secondary | ICD-10-CM | POA: Diagnosis not present

## 2019-08-06 DIAGNOSIS — I63512 Cerebral infarction due to unspecified occlusion or stenosis of left middle cerebral artery: Secondary | ICD-10-CM | POA: Diagnosis not present

## 2019-08-06 DIAGNOSIS — I119 Hypertensive heart disease without heart failure: Secondary | ICD-10-CM | POA: Diagnosis not present

## 2019-08-06 DIAGNOSIS — E7800 Pure hypercholesterolemia, unspecified: Secondary | ICD-10-CM | POA: Diagnosis not present

## 2019-08-06 DIAGNOSIS — I2510 Atherosclerotic heart disease of native coronary artery without angina pectoris: Secondary | ICD-10-CM | POA: Diagnosis not present

## 2019-08-06 DIAGNOSIS — I10 Essential (primary) hypertension: Secondary | ICD-10-CM | POA: Diagnosis not present

## 2019-08-27 DIAGNOSIS — L814 Other melanin hyperpigmentation: Secondary | ICD-10-CM | POA: Diagnosis not present

## 2019-08-27 DIAGNOSIS — D692 Other nonthrombocytopenic purpura: Secondary | ICD-10-CM | POA: Diagnosis not present

## 2019-08-27 DIAGNOSIS — Z85828 Personal history of other malignant neoplasm of skin: Secondary | ICD-10-CM | POA: Diagnosis not present

## 2019-08-27 DIAGNOSIS — L821 Other seborrheic keratosis: Secondary | ICD-10-CM | POA: Diagnosis not present

## 2019-08-27 DIAGNOSIS — L570 Actinic keratosis: Secondary | ICD-10-CM | POA: Diagnosis not present

## 2019-09-13 DIAGNOSIS — N183 Chronic kidney disease, stage 3 unspecified: Secondary | ICD-10-CM | POA: Diagnosis not present

## 2019-09-13 DIAGNOSIS — I2510 Atherosclerotic heart disease of native coronary artery without angina pectoris: Secondary | ICD-10-CM | POA: Diagnosis not present

## 2019-09-13 DIAGNOSIS — E7800 Pure hypercholesterolemia, unspecified: Secondary | ICD-10-CM | POA: Diagnosis not present

## 2019-09-13 DIAGNOSIS — I701 Atherosclerosis of renal artery: Secondary | ICD-10-CM | POA: Diagnosis not present

## 2019-09-13 DIAGNOSIS — N400 Enlarged prostate without lower urinary tract symptoms: Secondary | ICD-10-CM | POA: Diagnosis not present

## 2019-09-13 DIAGNOSIS — M159 Polyosteoarthritis, unspecified: Secondary | ICD-10-CM | POA: Diagnosis not present

## 2019-09-13 DIAGNOSIS — I10 Essential (primary) hypertension: Secondary | ICD-10-CM | POA: Diagnosis not present

## 2019-09-13 DIAGNOSIS — E785 Hyperlipidemia, unspecified: Secondary | ICD-10-CM | POA: Diagnosis not present

## 2019-09-13 DIAGNOSIS — I119 Hypertensive heart disease without heart failure: Secondary | ICD-10-CM | POA: Diagnosis not present

## 2019-09-13 DIAGNOSIS — I4891 Unspecified atrial fibrillation: Secondary | ICD-10-CM | POA: Diagnosis not present

## 2019-09-16 ENCOUNTER — Telehealth (HOSPITAL_COMMUNITY): Payer: Self-pay | Admitting: *Deleted

## 2019-09-16 ENCOUNTER — Other Ambulatory Visit: Payer: Self-pay

## 2019-09-16 DIAGNOSIS — I6523 Occlusion and stenosis of bilateral carotid arteries: Secondary | ICD-10-CM

## 2019-09-16 NOTE — Telephone Encounter (Signed)
Patient states he has been in AF since Saturday HRs have ranged from 68-95 BP 131/79. Feeling ok just fatigued and can feel the erratic heart rates. Appt made per pt request.

## 2019-09-17 ENCOUNTER — Encounter (HOSPITAL_COMMUNITY): Payer: Self-pay | Admitting: Physician Assistant

## 2019-09-17 ENCOUNTER — Ambulatory Visit (HOSPITAL_COMMUNITY)
Admission: RE | Admit: 2019-09-17 | Discharge: 2019-09-17 | Disposition: A | Discharge status: Home / Self Care | Payer: PPO | Source: Ambulatory Visit | Attending: Physician Assistant | Admitting: Physician Assistant

## 2019-09-17 ENCOUNTER — Other Ambulatory Visit: Payer: Self-pay

## 2019-09-17 VITALS — BP 126/62 | HR 61 | Ht 73.0 in | Wt 189.6 lb

## 2019-09-17 DIAGNOSIS — E785 Hyperlipidemia, unspecified: Secondary | ICD-10-CM | POA: Diagnosis not present

## 2019-09-17 DIAGNOSIS — D6869 Other thrombophilia: Secondary | ICD-10-CM | POA: Diagnosis not present

## 2019-09-17 DIAGNOSIS — Z8673 Personal history of transient ischemic attack (TIA), and cerebral infarction without residual deficits: Secondary | ICD-10-CM | POA: Insufficient documentation

## 2019-09-17 DIAGNOSIS — I4819 Other persistent atrial fibrillation: Secondary | ICD-10-CM | POA: Diagnosis not present

## 2019-09-17 DIAGNOSIS — Z8679 Personal history of other diseases of the circulatory system: Secondary | ICD-10-CM | POA: Diagnosis not present

## 2019-09-17 DIAGNOSIS — I10 Essential (primary) hypertension: Secondary | ICD-10-CM | POA: Insufficient documentation

## 2019-09-17 DIAGNOSIS — I6529 Occlusion and stenosis of unspecified carotid artery: Secondary | ICD-10-CM | POA: Insufficient documentation

## 2019-09-17 DIAGNOSIS — Z951 Presence of aortocoronary bypass graft: Secondary | ICD-10-CM | POA: Diagnosis not present

## 2019-09-17 DIAGNOSIS — Z87891 Personal history of nicotine dependence: Secondary | ICD-10-CM | POA: Insufficient documentation

## 2019-09-17 DIAGNOSIS — Z8249 Family history of ischemic heart disease and other diseases of the circulatory system: Secondary | ICD-10-CM | POA: Diagnosis not present

## 2019-09-17 DIAGNOSIS — Z79899 Other long term (current) drug therapy: Secondary | ICD-10-CM | POA: Insufficient documentation

## 2019-09-17 DIAGNOSIS — Z7901 Long term (current) use of anticoagulants: Secondary | ICD-10-CM | POA: Insufficient documentation

## 2019-09-17 DIAGNOSIS — I2510 Atherosclerotic heart disease of native coronary artery without angina pectoris: Secondary | ICD-10-CM | POA: Insufficient documentation

## 2019-09-17 NOTE — Progress Notes (Signed)
Primary Care Physician: Josetta Huddle, MD Primary Cardiologist: Dr Radford Pax Primary Electrophysiologist: Dr Curt Bears Referring Physician: Dr Blossom Hoops is a 79 y.o. male with a history of coronary artery disease status post CABG, hypertension, hyperlipidemia, paroxysmal atrial fibrillation, carotid stenosis status post left CEA, AAA status post repair who presents for follow up in the [facility]. Patient was started on amiodarone in 2017 when he developed postoperative afib. Patient's amiodarone was stopped 11/07/18 due to bradycardia. On 12/15/18 patient reports that he started having symptoms of fatigue and weakness. Seen by Dr Radford Pax on 12/19/18 and found to be in rate controlled afib. He denies any specific triggers. He does admit to alcohol use. A sleep study is pending. He is on Eliquis for a CHADS2VASC score of 6. Patient presented for DCCV on 12/28/18 but was in [place].   On follow up today, patient reports he went into afib on 09/14/19. He did not have symptoms but detected and irregular pulse. He checks his pulse daily. EMS was called and ECG confirmed rate controlled afib (personally reviewed). He was not taken to the ED. His BP and heart rate have been well controlled. He continues to deny any awareness of his arrhythmia.   Today, he denies symptoms of palpitations, chest pain, orthopnea, PND, lower extremity edema, dizziness, presyncope, syncope, snoring, daytime somnolence, bleeding, or neurologic sequela. The patient is tolerating medications without difficulties and is otherwise without complaint today.    Atrial Fibrillation Risk Factors:  he does not have symptoms or diagnosis of sleep apnea. Sleep study negative. he does not have a history of rheumatic fever. he does have a history of alcohol use. The patient does not have a history of early familial atrial fibrillation or other arrhythmias.  he has a BMI of Body mass index is 25.01  kg/m.[name] Filed Weights   09/17/19 0935  Weight: 86 kg    Family History  Problem Relation Age of Onset  . Heart disease Mother        AAA  Before age 63  . Cancer Mother 84       Colon cancer  . Hyperlipidemia Mother   . Heart failure Father      Atrial Fibrillation Management history:  Previous antiarrhythmic drugs: amiodarone Previous cardioversions: none Previous ablations: none CHADS2VASC score: 6 Anticoagulation history: Eliquis   Past Medical History:  Diagnosis Date  . Arthritis    HANDS AND FEET  . Asymptomatic carotid artery stenosis BILATERAL   PER DOPPLER  03/2016  RICA  1 - 39%/  s/p  L CEA  1-39%- followed by Dr. Donnetta Hutching  . CAD (coronary artery disease)    s/p remote stenting, s/p CABG with LIMA to LAD, SVG to RCA and SVG to LCx 12/2015  . Cataract immature BILATERAL   . Diverticulosis   . 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 History of diverticulosis    Noted on Colonoscopy  . History of inguinal herniorrhaphy   . Hydrocele, left   . Hyperlipidemia   . Hypertension   . Increased intraocular pressure   . Nocturia   . Paroxysmal atrial fibrillation ([facility]) 06/30/14   chad2vasc score is at least 4  . 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 Retinal detachment   . Stroke [facility])    Past Surgical History:  Procedure Laterality Date  . AAA REPAIR W/ AORTOBIFEMORAL BYPASS  OCT  17510  . ABDOMINAL  HERNIA REPAIR  X4  (LAST ONE 1990'S)   inguinal herniorrhaphy  . CARDIAC CATHETERIZATION N/A 12/16/2015   Procedure: Left Heart Cath and Coronary Angiography;  Surgeon: Wellington Hampshire, MD;  Location: [facility];  Service: Cardiovascular;  Laterality: N/A;  . CAROTID ENDARTERECTOMY    . CARPAL TUNNEL RELEASE Right Oct. 2013   Hand  . CORONARY ANGIOPLASTY  2002   PTCA  W/ X2 BM STENTS--   PROXIMA LAD  &  LEFT CIRCUMFLEX TO FIRST OBTUSE MARGINAL BRANCH  . CORONARY ARTERY BYPASS GRAFT N/A 12/18/2015    Procedure: CORONARY ARTERY BYPASS GRAFTING (CABG) x 3, using left internal mammary artery and bilateral   leg greater saphenous vein harvested endoscopically  LIMA to LAD, SVG to Circumflex, SVG to RCA;  Surgeon: Grace Isaac, MD;  Location: [facility];  Service: Open Heart Surgery;  Laterality: N/A;  . ENDARTERECTOMY Left 03/11/2015   Procedure: ENDARTERECTOMY LEFT CAROTID;  Surgeon: Rosetta Posner, MD;  Location: [facility];  Service: Vascular;  Laterality: Left;  . EXPLORATORY LAPAROTOMY W/ ENTEROLYSIS FOR PARTIAL SBO  09-23-2008  . heart stents  02/19/00  . HYDROCELE EXCISION  01/24/2011   Procedure: HYDROCELECTOMY ADULT;  Surgeon: Bernestine Amass, MD;  Location: [facility];  Service: Urology;  Laterality: Left;  . Ingrown Toe Nail Right Jan. 2015   Right Great Toe nail  . intest blockage  09/22/08  . PATCH ANGIOPLASTY Left 03/11/2015   Procedure: PATCH ANGIOPLASTY LEFT CAROTID USING HEMASHIELD PLATINUM FINESSE PATCH;  Surgeon: Rosetta Posner, MD;  Location: [facility];  Service: Vascular;  Laterality: Left;  . PULLEY RELEASE -- RIGHT 5TH FINGER  2009  . RETINAL DETACHMENT SURGERY  2004   BILATERAL  . TEE WITHOUT CARDIOVERSION N/A 12/18/2015   Procedure: TRANSESOPHAGEAL ECHOCARDIOGRAM (TEE);  Surgeon: Grace Isaac, MD;  Location: [facility];  Service: Open Heart Surgery;  Laterality: N/A;  . TOOTH EXTRACTION  Dec 2013    Current Outpatient Medications  Medication Sig Dispense Refill  . acetaminophen (TYLENOL) 500 MG tablet Take 2 tablets (1,000 mg total) by mouth every 6 (six) hours as needed. 30 tablet 0  . amLODipine (NORVASC) 5 MG tablet Take 5 mg by mouth daily.    [name] apixaban (ELIQUIS) 5 MG TABS tablet Take 1 tablet (5 mg total) by mouth 2 (two) times daily. 60 tablet 6  . Apoaequorin (PREVAGEN) 10 MG CAPS Take 10 mg by mouth daily.    [name] b complex vitamins tablet Take 1 tablet by mouth daily.      . calcium carbonate (OS-CAL) 600 MG TABS Take 600 mg by mouth daily.      .  Cholecalciferol 1000 UNITS capsule Take 2,000 Units by mouth daily.     . cloNIDine (CATAPRES) 0.1 MG tablet Take 0.1 mg by mouth at bedtime as needed.   5  . Coenzyme Q10 (CO Q 10) 100 MG CAPS Take 200 mg by mouth daily.     . cyanocobalamin 1000 MCG tablet Take 1,000 mcg by mouth daily.    [name] EPINEPHrine (EPIPEN) 0.3 mg/0.3 mL SOAJ injection Inject 0.3 mg into the muscle daily as needed for anaphylaxis (allergic reaction).     . finasteride (PROSCAR) 5 MG tablet Take 5 mg by mouth daily.    . hydrALAZINE (APRESOLINE) 25 MG tablet Take 25 mg by mouth 3 (three) times daily.    [name] losartan (COZAAR) 50 MG tablet Take 1 tablet (50 mg total) by mouth  daily. (Patient taking differently: Take 50 mg by mouth in the morning and at bedtime. ) 90 tablet 3  . Magnesium Hydroxide (MAGNESIA PO) Take 1,000 mg by mouth daily.     . Multiple Vitamin (MULTIVITAMIN) tablet Take 1 tablet by mouth daily.      . Omega-3 Fatty Acids (FISH OIL PO) Take 600 mg by mouth 2 (two) times daily.     [name] OVER THE COUNTER MEDICATION Take 0.5 Applicatorfuls by mouth daily. CBD oil    . potassium gluconate 595 (99 K) MG TABS tablet Take 595 mg by mouth.    . rosuvastatin (CRESTOR) 40 MG tablet Take 1 tablet (40 mg total) by mouth every evening. 90 tablet 3  . Tamsulosin HCl (FLOMAX) 0.4 MG CAPS Take 0.8 mg by mouth at bedtime.     . vitamin C (ASCORBIC ACID) 500 MG tablet Take 2,000 mg by mouth daily.     Current Facility-Administered Medications  Medication Dose Route Frequency Provider Last Rate Last Admin  . triamcinolone acetonide (KENALOG) 10 MG/ML injection 10 mg  10 mg Other Once Wallene Huh, DPM        Allergies  Allergen Reactions  . Wasp Venom Shortness Of Breath and Swelling  . Other     Social History   Socioeconomic History  . Marital status: Married    Spouse name: Not on file  . Number of children: Not on file  . Years of education: Not on file  . Highest education level: Not on file  Occupational  History  . Not on file  Tobacco Use  . Smoking status: Former Smoker    Years: 30.00    Types: Cigarettes    Quit date: 01/18/1978    Years since quitting: 41.6  . Smokeless tobacco: Former Systems developer    Quit date: 1980  Substance and Sexual Activity  . Alcohol use: Not Currently    Alcohol/week: 2.0 standard drinks    Types: 2 Shots of liquor per week    Comment: 2 oz of vodka per day  . Drug use: No  . Sexual activity: Not on file  Other Topics Concern  . Not on file  Social History Narrative   Pt lives in [place] with spouse.  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   Social Determinants of Health   Financial Resource Strain:   . Difficulty of Paying Living Expenses: Not on file  Food Insecurity:   . Worried About Charity fundraiser in the Last Year: Not on file  . Ran Out of Food in the Last Year: Not on file  Transportation Needs:   . Lack of Transportation (Medical): Not on file  . Lack of Transportation (Non-Medical): Not on file  Physical Activity:   . Days of Exercise per Week: Not on file  . Minutes of Exercise per Session: Not on file  Stress:   . Feeling of Stress : Not on file  Social Connections:   . Frequency of Communication with Friends and Family: Not on file  . Frequency of Social Gatherings with Friends and Family: Not on file  . Attends Religious Services: Not on file  . Active Member of Clubs or Organizations: Not on file  . Attends Archivist Meetings: Not on file  . Marital Status: Not on file  Intimate Partner Violence:   . Fear of Current or Ex-Partner: Not on file  . Emotionally Abused: Not on file  . Physically Abused: Not  on file  . Sexually Abused: Not on file     ROS- All systems are reviewed and negative except as per the HPI above.  Physical Exam: Vitals:   09/17/19 0935  BP: 126/62  Pulse: 61  Weight: 86 kg  Height: 6\' 1"  (1.854 m)    GEN- The patient is well appearing elderly male, alert and  oriented x 3 today.   HEENT-head normocephalic, atraumatic, sclera clear, conjunctiva pink, hearing intact, trachea midline. Lungs- Clear to ausculation bilaterally, normal work of breathing Heart- irregular rate and rhythm, no murmurs, rubs or gallops  GI- soft, NT, ND, + BS Extremities- no clubbing, cyanosis, or edema MS- no significant deformity or atrophy Skin- no rash or lesion Psych- euthymic mood, full affect Neuro- strength and sensation are intact   Wt Readings from Last 3 Encounters:  09/17/19 86 kg  03/20/19 81.2 kg  03/12/19 86.9 kg    EKG today demonstrates afib HR 61, LAFB, QRS 112, QTc 412  Echo 01/23/19 demonstrated  Left ventricular ejection fraction, by visual estimation, is 60 to 65%. The left ventricle has normal function. There is mildly increased left ventricular hypertrophy. 2. Left ventricular diastolic parameters are indeterminate. 3. Right ventricular volume/pressure overload. 4. The left ventricle has no regional wall motion abnormalities. 5. Global right ventricle has mildly reduced systolic function.The right ventricular size is mildly enlarged. No increase in right ventricular wall thickness. 6. Left atrial size was moderately dilated. 7. Right atrial size was moderately dilated. 8. The mitral valve is normal in structure. Mild mitral valve regurgitation. 9. The tricuspid valve is normal in structure. 10. The aortic valve is tricuspid. Aortic valve regurgitation is trivial. 11. The pulmonic valve was grossly normal. Pulmonic valve regurgitation is trivial. 12. Mildly elevated pulmonary artery systolic pressure. 13. The atrial septum is grossly normal. 14. The average left ventricular global longitudinal strain is -19.8 %.  Epic records are reviewed at length today  Assessment and Plan:  1. Persistent atrial fibrillation/atrial tach Patient has failed amiodarone due to bradycardia.  We discussed therapeutic options today including DCCV vs rate  control. Given his paucity of symptoms, good rate control, and severely dilated LA, patient would like to pursue rate control for now. Will arrange close follow up to reassess.  Continue Eliquis 5 mg BID  This patients CHA2DS2-VASc Score and unadjusted Ischemic Stroke Rate (% per year) is equal to 9.7 % stroke rate/year from a score of 6  Above score calculated as 1 point each if present [CHF, HTN, DM, Vascular=MI/PAD/Aortic Plaque, Age if 65-74, or Male] Above score calculated as 2 points each if present [Age > 75, or Stroke/TIA/TE]  2. CAD s/p CABG with LIMA to LAD, SVG to RCA and SVG to LCx 12/2015. No anginal symptoms.  3. HTN Stable, no changes today.   Follow up in the AF clinic in one month.    [facility] [address] [place], [place] [address] [phone] 09/17/2019 10:15 AM

## 2019-09-18 ENCOUNTER — Ambulatory Visit: Payer: PPO | Admitting: Cardiology

## 2019-09-19 ENCOUNTER — Other Ambulatory Visit: Payer: Self-pay | Admitting: *Deleted

## 2019-09-19 DIAGNOSIS — I6523 Occlusion and stenosis of bilateral carotid arteries: Secondary | ICD-10-CM

## 2019-09-19 DIAGNOSIS — I779 Disorder of arteries and arterioles, unspecified: Secondary | ICD-10-CM

## 2019-09-19 DIAGNOSIS — I739 Peripheral vascular disease, unspecified: Secondary | ICD-10-CM

## 2019-09-27 ENCOUNTER — Ambulatory Visit: Payer: PPO

## 2019-09-27 ENCOUNTER — Encounter (HOSPITAL_COMMUNITY): Payer: PPO

## 2019-10-01 ENCOUNTER — Ambulatory Visit: Payer: PPO

## 2019-10-01 ENCOUNTER — Encounter (HOSPITAL_COMMUNITY): Payer: PPO

## 2019-10-08 ENCOUNTER — Other Ambulatory Visit: Payer: Self-pay

## 2019-10-08 ENCOUNTER — Ambulatory Visit (HOSPITAL_COMMUNITY)
Admission: RE | Admit: 2019-10-08 | Discharge: 2019-10-08 | Disposition: A | Discharge status: Home / Self Care | Payer: PPO | Source: Ambulatory Visit | Attending: Vascular Surgery | Admitting: Vascular Surgery

## 2019-10-08 ENCOUNTER — Encounter (INDEPENDENT_AMBULATORY_CARE_PROVIDER_SITE_OTHER): Payer: PPO | Admitting: Ophthalmology

## 2019-10-08 DIAGNOSIS — H35033 Hypertensive retinopathy, bilateral: Secondary | ICD-10-CM | POA: Diagnosis not present

## 2019-10-08 DIAGNOSIS — I10 Essential (primary) hypertension: Secondary | ICD-10-CM

## 2019-10-08 DIAGNOSIS — Z87891 Personal history of nicotine dependence: Secondary | ICD-10-CM | POA: Diagnosis not present

## 2019-10-08 DIAGNOSIS — I743 Embolism and thrombosis of arteries of the lower extremities: Secondary | ICD-10-CM | POA: Diagnosis not present

## 2019-10-08 DIAGNOSIS — I779 Disorder of arteries and arterioles, unspecified: Secondary | ICD-10-CM | POA: Insufficient documentation

## 2019-10-08 DIAGNOSIS — H43813 Vitreous degeneration, bilateral: Secondary | ICD-10-CM | POA: Diagnosis not present

## 2019-10-08 DIAGNOSIS — H338 Other retinal detachments: Secondary | ICD-10-CM | POA: Diagnosis not present

## 2019-10-08 DIAGNOSIS — I739 Peripheral vascular disease, unspecified: Secondary | ICD-10-CM | POA: Diagnosis not present

## 2019-10-08 DIAGNOSIS — I6523 Occlusion and stenosis of bilateral carotid arteries: Secondary | ICD-10-CM

## 2019-10-08 DIAGNOSIS — H33302 Unspecified retinal break, left eye: Secondary | ICD-10-CM | POA: Diagnosis not present

## 2019-10-08 DIAGNOSIS — I6521 Occlusion and stenosis of right carotid artery: Secondary | ICD-10-CM | POA: Diagnosis not present

## 2019-10-08 NOTE — Progress Notes (Addendum)
[facility]    Provider:  Dr Jaynee Eagles Requesting Provider: Josetta Huddle, MD Primary Care Provider:  Josetta Huddle, MD  CC:  neuropathy  HPI:  [name] is a 79 y.o. male here as requested by Josetta Huddle, MD for symptoms of peripheral neuropathy.  He has a past medical history of renal artery atherosclerosis, stenosis of carotid artery, A. fib, osteoarthritis involving multiple joints, left MCA stroke, fatigue, chronic kidney disease, peripheral vascular disease, sciatica of the right side, bradycardia, hypertensive heart disease, coronary artery disease, hypertension, aortic aneurysm abdominal, hypercholesterolemia, paresthesias and chronic pain syndrome, peripheral neuropathy.  I reviewed Dr. Inda Merlin notes: Patient decided to cut out alcohol and noticing improvement with palpitations, he reported paresthesias in his right foot where his foot feels like it wet when he is walking when actually has not been wet, Dr. Inda Merlin discussed this may improve after moving away from alcohol, he does not have diabetes, he was screened for hypothyroidism and B12 deficiency.reviewed examination which showed normal lungs, heart, extremities without clubbing cyanosis or edema, pulses 2+ bilaterally, focal neurologic exam overall good cranial nerves grossly intact, sensorimotor cerebellar function intact.  I reviewed labs TSH was normal, vitamin D was excellent 114, CMP showed BUN 25 and creatinine 1.25, B12 was 593.  These labs were collected July 09, 2019.  Sherley Bounds and neurosurgery ordered an MRI of the lumbar spine in May 2020, I reviewed images see below, so it appears as though he is/was also seeing neurosurgery for low back issues  although this is not specifically  stated in Dr. Inda Merlin notes.  He has numbness in the left toes dig 4 and 5 burning, hanging it over the side of the bed makes it better, no color changes, doesn't bother during the day as he sits or walks, 18 months ago, he is  going to see Dr. Donnetta Hutching for follow up soon hasn't seen him yeta again. In the right foot 5-6 times a day his foot feels wet the whole foot, during the day, only when walking or getting up out of the seat, it is immediate, even right now and it is the whole foot, mostly the toes bit possibly up to the lower ankle. He has very little back pain, right foot ongoing He has arthritis everywhere. Right foot is not burning, not numb, he feels everything, he knows where feet are in space, he says it feels "suishy" as he walks. No pain right foot. Vodka and tonic 2 a day all his life. The right foot symptoms just came on one day, not getting better or worse. Stable. Nothing makes it worse or better it is just is there. No weakness. No changes bowel bladder. No other focal neurologic deficits, associated symptoms, inciting events or modifiable factors.  Reviewed notes, labs and imaging from outside physicians, which showed:   06/30/2018: MRI Lumbar Spine personally reviwed   Multiplanar, multisequence MR imaging of the lumbar spine was performed. No intravenous contrast was administered.  COMPARISON:  CT images including the lumbar spine from 08/23/2011.  FINDINGS: Segmentation: The lowest lumbar type non-rib-bearing vertebra is labeled as L5.  Alignment: 3 mm degenerative anterolisthesis at L3-4 and 4 mm degenerative anterolisthesis at L4-5.  Vertebrae: Degenerative loss of intervertebral disc height at L3-4, L4-5, and L5-S1. Mild type 1 degenerative endplate findings at K9-F8.  Conus medullaris and cauda equina: Conus extends to the L1 level. Conus and cauda equina appear normal.  Paraspinal and other soft tissues: Unremarkable  Disc levels:  L1-2: Unremarkable.  L2-3: Unremarkable.  L3-4: Mild bilateral foraminal stenosis and mild left subarticular lateral recess stenosis due to diffuse disc bulge and facet arthropathy.  L4-5: Moderate right and mild to moderate left foraminal  stenosis with mild bilateral subarticular lateral recess stenosis and borderline central narrowing of the thecal sac due to disc uncovering, disc bulge, and facet arthropathy.  L5-S1: Borderline bilateral foraminal stenosis and borderline left subarticular lateral recess stenosis due to facet arthropathy, disc bulge, and central disc protrusion.  IMPRESSION: 1. Lumbar spondylosis and degenerative disc disease, resulting in moderate impingement at L4-5, and mild impingement at L3-4 as detailed above.   Review of Systems: Patient complains of symptoms per HPI as well as the following symptoms: arthritis, sensory changes. Pertinent negatives and positives per HPI. All others negative.   Social History   Socioeconomic History  . Marital status: Married    Spouse name: Not on file  . Number of children: 2  . Years of education: some college  . Highest education level: Not on file  Occupational History  . Not on file  Tobacco Use  . Smoking status: Former Smoker    Years: 30.00    Types: Cigarettes    Quit date: 01/18/1978    Years since quitting: 41.7  . Smokeless tobacco: Former Systems developer    Quit date: 1980  Substance and Sexual Activity  . Alcohol use: Not Currently    Alcohol/week: 2.0 standard drinks    Types: 2 Shots of liquor per week    Comment: 2 oz of vodka per day  . Drug use: No  . Sexual activity: Not on file  Other Topics Concern  . Not on file  Social History Narrative   Pt lives in [place] with spouse.     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   Right Handed   No caffeine   Social Determinants of Health   Financial Resource Strain:   . Difficulty of Paying Living Expenses: Not on file  Food Insecurity:   . Worried About Charity fundraiser in the Last Year: Not on file  . Ran Out of Food in the Last Year: Not on file  Transportation Needs:   . Lack of Transportation (Medical): Not on file  . Lack of Transportation  (Non-Medical): Not on file  Physical Activity:   . Days of Exercise per Week: Not on file  . Minutes of Exercise per Session: Not on file  Stress:   . Feeling of Stress : Not on file  Social Connections:   . Frequency of Communication with Friends and Family: Not on file  . Frequency of Social Gatherings with Friends and Family: Not on file  . Attends Religious Services: Not on file  . Active Member of Clubs or Organizations: Not on file  . Attends Archivist Meetings: Not on file  . Marital Status: Not on file  Intimate Partner Violence:   . Fear of Current or Ex-Partner: Not on file  . Emotionally Abused: Not on file  . Physically Abused: Not on file  . Sexually Abused: Not on file    Family History  Problem Relation Age of Onset  . Heart disease Mother        AAA  Before age 37  . Cancer Mother 75       Colon cancer  . Hyperlipidemia Mother   . Heart failure Father     Past Medical History:  Diagnosis Date  .  Arthritis    HANDS AND FEET  . Asymptomatic carotid artery stenosis BILATERAL   PER DOPPLER  03/2016  RICA  1 - 39%/  s/p  L CEA  1-39%- followed by Dr. Donnetta Hutching  . CAD (coronary artery disease)    s/p remote stenting, s/p CABG with LIMA to LAD, SVG to RCA and SVG to LCx 12/2015  . Cataract immature BILATERAL   . Diverticulosis   . 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 History of diverticulosis    Noted on Colonoscopy  . History of inguinal herniorrhaphy   . Hydrocele, left   . Hyperlipidemia   . Hypertension   . Increased intraocular pressure   . Lumbar degenerative disc disease 03/2017  . Nocturia   . Paroxysmal atrial fibrillation ([place]) 06/30/14   chad2vasc score is at least 4  . 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 PVD (peripheral vascular disease) ([place])   . 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 Retinal detachment   . SBO (small bowel obstruction) ([place])   . Stroke [facility])    mini stroke/2017     Patient Active Problem List   Diagnosis Date Noted  . Secondary hypercoagulable state ([place]) 12/21/2018  . Degeneration of lumbar intervertebral disc 05/25/2017  . Carotid stenosis 02/25/2015  . Essential hypertension 02/25/2015  . Stroke ([place]) 02/22/2015  . CVA (cerebral infarction) 02/22/2015  . Cerebrovascular accident (CVA) due to occlusion of cerebral artery ([employer])   . Renal insufficiency   . Persistent atrial fibrillation ([place]) 09/12/2014  . Migraine aura without headache 09/10/2013  . Peripheral vascular disease, unspecified ([place]) 10/05/2012  . Abdominal aneurysm without mention of rupture 10/05/2012  . Thoracic aneurysm, ruptured ([place]) 10/05/2012  . Pure hypercholesterolemia 10/05/2012  . Coronary atherosclerosis of native coronary artery 10/05/2012  . Other and unspecified hyperlipidemia 10/05/2012  . Atherosclerosis of renal artery ([place]) 10/05/2012  . Other specified type of hydrocele 10/05/2012  . Family history of malignant neoplasm of gastrointestinal tract 10/05/2012  . Bronchitis, not specified as acute or chronic 10/05/2012  . Nocturia   . Arthritis   . History of AAA (abdominal aortic aneurysm) repair   . Status post primary angioplasty with coronary stent   . Popliteal artery aneurysm ([place])   . Retinal detachment   . Increased intraocular pressure   . History of inguinal herniorrhaphy   . History of diverticulosis   . Hydrocele 01/24/2011  . Hydrocele, left 01/17/2011    Past Surgical History:  Procedure Laterality Date  . AAA REPAIR W/ AORTOBIFEMORAL BYPASS  OCT  14481  . ABDOMINAL HERNIA REPAIR  X4  (LAST ONE 1990'S)   inguinal herniorrhaphy  . CARDIAC CATHETERIZATION N/A 12/16/2015   Procedure: Left Heart Cath and Coronary Angiography;  Surgeon: Wellington Hampshire, MD;  Location: [facility];  Service: Cardiovascular;  Laterality: N/A;  . CAROTID ENDARTERECTOMY    . CARPAL TUNNEL RELEASE Right Oct. 2013   Hand  . CORONARY ANGIOPLASTY  2002    PTCA  W/ X2 BM STENTS--   PROXIMA LAD  &  LEFT CIRCUMFLEX TO FIRST OBTUSE MARGINAL BRANCH  . CORONARY ARTERY BYPASS GRAFT N/A 12/18/2015   Procedure: CORONARY ARTERY BYPASS GRAFTING (CABG) x 3, using left internal mammary artery and bilateral   leg greater saphenous vein harvested endoscopically  LIMA to LAD, SVG to Circumflex, SVG to RCA;  Surgeon: Grace Isaac, MD;  Location: [facility];  Service: Open Heart Surgery;  Laterality: N/A;  .  CORONARY ARTERY BYPASS GRAFT     3 vessel  . ENDARTERECTOMY Left 03/11/2015   Procedure: ENDARTERECTOMY LEFT CAROTID;  Surgeon: Rosetta Posner, MD;  Location: [facility];  Service: Vascular;  Laterality: Left;  . EXPLORATORY LAPAROTOMY W/ ENTEROLYSIS FOR PARTIAL SBO  09-23-2008  . heart stents  02/19/00  . HYDROCELE EXCISION  01/24/2011   Procedure: HYDROCELECTOMY ADULT;  Surgeon: Bernestine Amass, MD;  Location: [facility];  Service: Urology;  Laterality: Left;  . Ingrown Toe Nail Right Jan. 2015   Right Great Toe nail  . intest blockage  09/22/08  . PATCH ANGIOPLASTY Left 03/11/2015   Procedure: PATCH ANGIOPLASTY LEFT CAROTID USING HEMASHIELD PLATINUM FINESSE PATCH;  Surgeon: Rosetta Posner, MD;  Location: [facility];  Service: Vascular;  Laterality: Left;  . PULLEY RELEASE -- RIGHT 5TH FINGER  2009  . RETINAL DETACHMENT SURGERY  2004   BILATERAL  . TEE WITHOUT CARDIOVERSION N/A 12/18/2015   Procedure: TRANSESOPHAGEAL ECHOCARDIOGRAM (TEE);  Surgeon: Grace Isaac, MD;  Location: [facility];  Service: Open Heart Surgery;  Laterality: N/A;  . TOOTH EXTRACTION  Dec 2013    Current Outpatient Medications  Medication Sig Dispense Refill  . acetaminophen (TYLENOL) 325 MG tablet Take 325 mg by mouth every 4 (four) hours as needed.    [name] acetaminophen (TYLENOL) 500 MG tablet Take 2 tablets (1,000 mg total) by mouth every 6 (six) hours as needed. 30 tablet 0  . amLODipine (NORVASC) 5 MG tablet Take 5 mg by mouth daily.    [name] apixaban (ELIQUIS) 5 MG TABS tablet Take 1  tablet (5 mg total) by mouth 2 (two) times daily. 60 tablet 6  . Apoaequorin (PREVAGEN) 10 MG CAPS Take 10 mg by mouth daily.    [name] b complex vitamins tablet Take 1 tablet by mouth daily.      . calcium carbonate (OS-CAL) 600 MG TABS Take 600 mg by mouth daily.      . Cholecalciferol 1000 UNITS capsule Take 2,000 Units by mouth daily.     . cloNIDine (CATAPRES) 0.1 MG tablet Take 0.1 mg by mouth at bedtime as needed.   5  . Coenzyme Q10 (CO Q 10) 100 MG CAPS Take 200 mg by mouth daily.     . cyanocobalamin 1000 MCG tablet Take 1,000 mcg by mouth daily.    [name] EPINEPHrine (EPIPEN) 0.3 mg/0.3 mL SOAJ injection Inject 0.3 mg into the muscle daily as needed for anaphylaxis (allergic reaction).     . finasteride (PROSCAR) 5 MG tablet Take 5 mg by mouth daily.    . hydrALAZINE (APRESOLINE) 25 MG tablet Take 25 mg by mouth 3 (three) times daily as needed.     . Magnesium Hydroxide (MAGNESIA PO) Take 1,000 mg by mouth daily.     . Multiple Vitamin (MULTIVITAMIN) tablet Take 1 tablet by mouth daily.      . Omega-3 Fatty Acids (FISH OIL PO) Take 600 mg by mouth 2 (two) times daily.     . potassium gluconate 595 (99 K) MG TABS tablet Take 595 mg by mouth.    . rosuvastatin (CRESTOR) 40 MG tablet Take 1 tablet (40 mg total) by mouth every evening. 90 tablet 3  . Tamsulosin HCl (FLOMAX) 0.4 MG CAPS Take 0.8 mg by mouth at bedtime.     . vitamin C (ASCORBIC ACID) 500 MG tablet Take 2,000 mg by mouth daily.    [name] losartan (COZAAR) 50 MG tablet Take 1 tablet (50  mg total) by mouth daily. (Patient taking differently: Take 50 mg by mouth in the morning and at bedtime. ) 90 tablet 3   No current facility-administered medications for this visit.    Allergies as of 10/09/2019 - Review Complete 10/09/2019  Allergen Reaction Noted  . Wasp venom Shortness Of Breath and Swelling 08/21/2012  . Other  08/21/2017  . Ramipril Swelling 10/09/2019    Vitals: BP (!) 105/59 (BP Location: Left Arm, Patient Position:  Sitting)   Pulse 67   Ht _0  (1.854 m)   Wt 189 lb 9.6 oz (86 kg)   BMI 25.01 kg/m  Last Weight:  Wt Readings from Last 1 Encounters:  10/09/19 189 lb 9.6 oz (86 kg)   Last Height:   Ht Readings from Last 1 Encounters:  10/09/19 _1  (1.854 m)     Physical exam: Exam: Gen: NAD, conversant, well nourised, well groomed                     CV: irregular , no MRG. No Carotid Bruits. No peripheral edema, warm, nontender Eyes: Conjunctivae clear without exudates or hemorrhage  Neuro: Detailed Neurologic Exam  Speech:    Speech is normal; fluent and spontaneous with normal comprehension.  Cognition:    The patient is oriented to person, place, and time;     recent and remote memory intact;     language fluent;     normal attention, concentration,     fund of knowledge Cranial Nerves:    The pupils are equal, round, and reactive to light. Attempted fundoscopy could not visualize due to small pupis.. Visual fields are full to finger confrontation. Extraocular movements are intact. Trigeminal sensation is intact and the muscles of mastication are normal. The face is symmetric. The palate elevates in the midline. Hearing intact. Voice is normal. Shoulder shrug is normal. The tongue has normal motion without fasciculations.   Coordination:    Normal finger to nose   Gait:    Heel-toe intact, tandem gait with imbalance .   Motor Observation:    No asymmetry, no atrophy, and no involuntary movements noted. Tone:    Normal muscle tone.    Posture:    Posture is normal. normal erect    Strength:    Strength is V/V in the upper and lower limbs.      Sensation: intact to LT, intact distally in the feet to pinprick, cold sensation, proprioception, some slightly decreased vibration at the great toe, negative Romberg     Reflex Exam:  DTR's: trace left AJ, absent right AJ, othewise deep tendon reflexes in the upper and lower extremities are normal bilaterally.   Toes:    The  toes are downgoing bilaterally.   Clonus:    Clonus is absent.    Assessment/Plan: This is a really lovely 79 year old male with some symptoms in the feet not really consistent with a peripheral polyneuropathy, however he does have multiple medical conditions that could cause sensory changes such as peripheral artery disease,  chronic kidney disease, history of long-term alcohol use, prior sciatica of the right side and evidence on MRI of the lumbar spine of likely L5-S1 impingementflat feet and hammertoes that can cause mechanical reasons (however I do not think this is CMT or inherited disorder),.  His sensory exam is quite good distally in the feet.  - He has numbness in the left toes dig 4 and 5 burning, hanging it over the side of the  bed makes it better, sounds more vascular.   -He has a "squishy" feeling in the bottom of the right foot only when he walks, not progressive, no numbness or tingling or burning or pain.  I did notice he had absent reflexes on the right AJ with trace in the left AJ so this could be from L5-S1 radiculopathy despite no current back pain, may also be vascular in nature, may also be mechanical he has flat feet;  does not really sound like a classic peripheral neuropathy/nerve damage.  I had a long discussion with patient today, at this time we will test him for other causes for peripheral neuropathy however really cannot diagnose this feeling of "squishiness" on the bottom of the right foot at this time. -He has been screened for B12 deficiency and thyroid issues.  At this time I will order several other tests to see if there are any other causes I can find for peripheral neuropathy.  Again he has multiple risk factors already. especially peripheral artery disease, for sensory symptoms in the feet. -I do not think an EMG nerve conduction study would give [place] any additional information at this time but if he should progress I would encourage him to contact us and we can  further examine. -Follow-up with Dr. Donnetta Hutching in vascular for his peripheral artery disease, he recently went through testing and has a follow-up.  Orders Placed This Encounter  Procedures  . Vitamin B1  . Hemoglobin A1c  . Vitamin B6  . Rheumatoid factor  . Sjogren's syndrome antibods(ssa + ssb)  . Heavy metals, blood  . Multiple Myeloma Panel (SPEP&IFE w/QIG)  . ANA, IFA (with reflex)   No orders of the defined types were placed in this encounter.   Cc: Josetta Huddle, MD,  Josetta Huddle, MD  Sarina Ill, MD  [facility] [address] [place], [place] [address]  Phone [phone] Fax [phone]  I spent more than 70 minutes of face-to-face and non-face-to-face time with patient on the  1. Sensation disturbance of skin    diagnosis.  This included previsit chart review, lab review, study review, order entry, electronic health record documentation, patient education on the different diagnostic and therapeutic options, counseling and coordination of care, risks and benefits of management, compliance, or risk factor reduction

## 2019-10-09 ENCOUNTER — Encounter: Payer: Self-pay | Admitting: Neurology

## 2019-10-09 ENCOUNTER — Encounter: Payer: Self-pay | Admitting: Emergency Medicine

## 2019-10-09 ENCOUNTER — Ambulatory Visit: Payer: PPO | Admitting: Neurology

## 2019-10-09 VITALS — BP 105/59 | HR 67 | Ht 73.0 in | Wt 189.6 lb

## 2019-10-09 DIAGNOSIS — R209 Unspecified disturbances of skin sensation: Secondary | ICD-10-CM

## 2019-10-09 NOTE — Patient Instructions (Addendum)
Blood work Follow clinically If needed in the future consider emg/ncs   Peripheral Neuropathy Peripheral neuropathy is a type of nerve damage. It affects nerves that carry signals between the spinal cord and the arms, legs, and the rest of the body (peripheral nerves). It does not affect nerves in the spinal cord or brain. In peripheral neuropathy, one nerve or a group of nerves may be damaged. Peripheral neuropathy is a broad category that includes many specific nerve disorders, like diabetic neuropathy, hereditary neuropathy, and carpal tunnel syndrome. What are the causes? This condition may be caused by:  Diabetes. This is the most common cause of peripheral neuropathy.  Nerve injury.  Pressure or stress on a nerve that lasts a long time.  Lack (deficiency) of B vitamins. This can result from alcoholism, poor diet, or a restricted diet.  Infections.  Autoimmune diseases, such as rheumatoid arthritis and systemic lupus erythematosus.  Nerve diseases that are passed from parent to child (inherited).  Some medicines, such as cancer medicines (chemotherapy).  Poisonous (toxic) substances, such as lead and mercury.  Too little blood flowing to the legs.  Kidney disease.  Thyroid disease. In some cases, the cause of this condition is not known. What are the signs or symptoms? Symptoms of this condition depend on which of your nerves is damaged. Common symptoms include:  Loss of feeling (numbness) in the feet, hands, or both.  Tingling in the feet, hands, or both.  Burning pain.  Very sensitive skin.  Weakness.  Not being able to move a part of the body (paralysis).  Muscle twitching.  Clumsiness or poor coordination.  Loss of balance.  Not being able to control your bladder.  Feeling dizzy.  Sexual problems. How is this diagnosed? Diagnosing and finding the cause of peripheral neuropathy can be difficult. Your health care provider will take your medical  history and do a physical exam. A neurological exam will also be done. This involves checking things that are affected by your brain, spinal cord, and nerves (nervous system). For example, your health care provider will check your reflexes, how you move, and what you can feel. You may have other tests, such as:  Blood tests.  Electromyogram (EMG) and nerve conduction tests. These tests check nerve function and how well the nerves are controlling the muscles.  Imaging tests, such as CT scans or MRI to rule out other causes of your symptoms.  Removing a small piece of nerve to be examined in a lab (nerve biopsy). This is rare.  Removing and examining a small amount of the fluid that surrounds the brain and spinal cord (lumbar puncture). This is rare. How is this treated? Treatment for this condition may involve:  Treating the underlying cause of the neuropathy, such as diabetes, kidney disease, or vitamin deficiencies.  Stopping medicines that can cause neuropathy, such as chemotherapy.  Medicine to relieve pain. Medicines may include: ? Prescription or over-the-counter pain medicine. ? Antiseizure medicine. ? Antidepressants. ? Pain-relieving patches that are applied to painful areas of skin.  Surgery to relieve pressure on a nerve or to destroy a nerve that is causing pain.  Physical therapy to help improve movement and balance.  Devices to help you move around (assistive devices). Follow these instructions at home: Medicines  Take over-the-counter and prescription medicines only as told by your health care provider. Do not take any other medicines without first asking your health care provider.  Do not drive or use heavy machinery while taking prescription  pain medicine. Lifestyle   Do not use any products that contain nicotine or tobacco, such as cigarettes and e-cigarettes. Smoking keeps blood from reaching damaged nerves. If you need help quitting, ask your health care  provider.  Avoid or limit alcohol. Too much alcohol can cause a vitamin B deficiency, and vitamin B is needed for healthy nerves.  Eat a healthy diet. This includes: ? Eating foods that are high in fiber, such as fresh fruits and vegetables, whole grains, and beans. ? Limiting foods that are high in fat and processed sugars, such as fried or sweet foods. General instructions   If you have diabetes, work closely with your health care provider to keep your blood sugar under control.  If you have numbness in your feet: ? Check every day for signs of injury or infection. Watch for redness, warmth, and swelling. ? Wear padded socks and comfortable shoes. These help protect your feet.  Develop a good support system. Living with peripheral neuropathy can be stressful. Consider talking with a mental health specialist or joining a support group.  Use assistive devices and attend physical therapy as told by your health care provider. This may include using a walker or a cane.  Keep all follow-up visits as told by your health care provider. This is important. Contact a health care provider if:  You have new signs or symptoms of peripheral neuropathy.  You are struggling emotionally from dealing with peripheral neuropathy.  Your pain is not well-controlled. Get help right away if:  You have an injury or infection that is not healing normally.  You develop new weakness in an arm or leg.  You fall frequently. Summary  Peripheral neuropathy is when the nerves in the arms, or legs are damaged, resulting in numbness, weakness, or pain.  There are many causes of peripheral neuropathy, including diabetes, pinched nerves, vitamin deficiencies, autoimmune disease, and hereditary conditions.  Diagnosing and finding the cause of peripheral neuropathy can be difficult. Your health care provider will take your medical history, do a physical exam, and do tests, including blood tests and nerve function  tests.  Treatment involves treating the underlying cause of the neuropathy and taking medicines to help control pain. Physical therapy and assistive devices may also help. This information is not intended to replace advice given to you by your health care provider. Make sure you discuss any questions you have with your health care provider. Document Revised: 12/16/2016 Document Reviewed: 03/14/2016 Elsevier Patient Education  2020 [place] is a test to check how well your muscles and nerves are working. This procedure includes the combined use of electromyogram (EMG) and nerve conduction study (NCS). EMG is used to look for muscular disorders. NCS, which is also called electroneurogram, measures how well your nerves are controlling your muscles. The procedures are usually done together to check if your muscles and nerves are healthy. If the results of the tests are abnormal, this may indicate disease or injury, such as a neuromuscular disease or peripheral nerve damage. Tell a health care provider about:  Any allergies you have.  All medicines you are taking, including vitamins, herbs, eye drops, creams, and over-the-counter medicines.  Any problems you or family members have had with anesthetic medicines.  Any blood disorders you have.  Any surgeries you have had.  Any medical conditions you have.  If you have a pacemaker.  Whether you are pregnant or may be pregnant. What are the risks? Generally, this is  a safe procedure. However, problems may occur, including:  Infection where the electrodes were inserted.  Bleeding. What happens before the procedure? Medicines Ask your health care provider about:  Changing or stopping your regular medicines. This is especially important if you are taking diabetes medicines or blood thinners.  Taking medicines such as aspirin and ibuprofen. These medicines can thin your blood. Do not take  these medicines unless your health care provider tells you to take them.  Taking over-the-counter medicines, vitamins, herbs, and supplements. General instructions  Your health care provider may ask you to avoid: ? Beverages that have caffeine, such as coffee and tea. ? Any products that contain nicotine or tobacco. These products include cigarettes, e-cigarettes, and chewing tobacco. If you need help quitting, ask your health care provider.  Do not use lotions or creams on the same day that you will be having the procedure. What happens during the procedure? For EMG   Your health care provider will ask you to stay in a position so that he or she can access the muscle that will be studied. You may be standing, sitting, or lying down.  You may be given a medicine that numbs the area (local anesthetic).  A very thin needle that has an electrode will be inserted into your muscle.  Another small electrode will be placed on your skin near the muscle.  Your health care provider will ask you to continue to remain still.  The electrodes will send a signal that tells about the electrical activity of your muscles. You may see this on a monitor or hear it in the room.  After your muscles have been studied at rest, your health care provider will ask you to contract or flex your muscles. The electrodes will send a signal that tells about the electrical activity of your muscles.  Your health care provider will remove the electrodes and the electrode needles when the procedure is finished. The procedure may vary among health care providers and hospitals. For NCS   An electrode that records your nerve activity (recording electrode) will be placed on your skin by the muscle that is being studied.  An electrode that is used as a reference (reference electrode) will be placed near the recording electrode.  A paste or gel will be applied to your skin between the recording electrode and the reference  electrode.  Your nerve will be stimulated with a mild shock. Your health care provider will measure how much time it takes for your muscle to react.  Your health care provider will remove the electrodes and the gel when the procedure is finished. The procedure may vary among health care providers and hospitals. What happens after the procedure?  It is up to you to get the results of your procedure. Ask your health care provider, or the department that is doing the procedure, when your results will be ready.  Your health care provider may: ? Give you medicines for any pain. ? Monitor the insertion sites to make sure that bleeding stops. Summary  Electromyoneurogram is a test to check how well your muscles and nerves are working.  If the results of the tests are abnormal, this may indicate disease or injury.  This is a safe procedure. However, problems may occur, such as bleeding and infection.  Your health care provider will do two tests to complete this procedure. One checks your muscles (EMG) and another checks your nerves (NCS).  It is up to you to get the  results of your procedure. Ask your health care provider, or the department that is doing the procedure, when your results will be ready. This information is not intended to replace advice given to you by your health care provider. Make sure you discuss any questions you have with your health care provider. Document Revised: 09/19/2017 Document Reviewed: 09/01/2017 Elsevier Patient Education  [place].

## 2019-10-09 NOTE — Addendum Note (Signed)
Addended by: Sarina Ill B on: 10/09/2019 02:45 PM   Modules accepted: Orders

## 2019-10-11 LAB — ANTINUCLEAR ANTIBODIES, IFA: ANA Titer 1: NEGATIVE

## 2019-10-14 ENCOUNTER — Ambulatory Visit: Payer: PPO | Admitting: Vascular Surgery

## 2019-10-14 DIAGNOSIS — E785 Hyperlipidemia, unspecified: Secondary | ICD-10-CM | POA: Diagnosis not present

## 2019-10-14 DIAGNOSIS — N400 Enlarged prostate without lower urinary tract symptoms: Secondary | ICD-10-CM | POA: Diagnosis not present

## 2019-10-14 DIAGNOSIS — I63512 Cerebral infarction due to unspecified occlusion or stenosis of left middle cerebral artery: Secondary | ICD-10-CM | POA: Diagnosis not present

## 2019-10-14 DIAGNOSIS — I119 Hypertensive heart disease without heart failure: Secondary | ICD-10-CM | POA: Diagnosis not present

## 2019-10-14 DIAGNOSIS — N183 Chronic kidney disease, stage 3 unspecified: Secondary | ICD-10-CM | POA: Diagnosis not present

## 2019-10-14 DIAGNOSIS — M159 Polyosteoarthritis, unspecified: Secondary | ICD-10-CM | POA: Diagnosis not present

## 2019-10-14 DIAGNOSIS — I4891 Unspecified atrial fibrillation: Secondary | ICD-10-CM | POA: Diagnosis not present

## 2019-10-14 DIAGNOSIS — E7800 Pure hypercholesterolemia, unspecified: Secondary | ICD-10-CM | POA: Diagnosis not present

## 2019-10-14 DIAGNOSIS — M1990 Unspecified osteoarthritis, unspecified site: Secondary | ICD-10-CM | POA: Diagnosis not present

## 2019-10-14 DIAGNOSIS — I2510 Atherosclerotic heart disease of native coronary artery without angina pectoris: Secondary | ICD-10-CM | POA: Diagnosis not present

## 2019-10-14 DIAGNOSIS — I701 Atherosclerosis of renal artery: Secondary | ICD-10-CM | POA: Diagnosis not present

## 2019-10-14 DIAGNOSIS — I10 Essential (primary) hypertension: Secondary | ICD-10-CM | POA: Diagnosis not present

## 2019-10-17 LAB — HEAVY METALS, BLOOD
Arsenic: 4 ug/L (ref 2–23)
Lead, Blood: 2 ug/dL (ref 0–4)
Mercury: <1 ug/L (ref 0.0–14.9)

## 2019-10-17 LAB — MULTIPLE MYELOMA PANEL, SERUM
Albumin SerPl Elph-Mcnc: 4.1 g/dL (ref 2.9–4.4)
Albumin/Glob SerPl: 1.5 (ref 0.7–1.7)
Alpha 1: 0.2 g/dL (ref 0.0–0.4)
Alpha2 Glob SerPl Elph-Mcnc: 0.7 g/dL (ref 0.4–1.0)
B-Globulin SerPl Elph-Mcnc: 1.1 g/dL (ref 0.7–1.3)
Gamma Glob SerPl Elph-Mcnc: 0.9 g/dL (ref 0.4–1.8)
Globulin, Total: 2.9 g/dL (ref 2.2–3.9)
IgA/Immunoglobulin A, Serum: 478 mg/dL — ABNORMAL HIGH (ref 61–437)
IgG (Immunoglobin G), Serum: 945 mg/dL (ref 603–1613)
IgM (Immunoglobulin M), Srm: 62 mg/dL (ref 15–143)
Total Protein: 7 g/dL (ref 6.0–8.5)

## 2019-10-17 LAB — SJOGREN'S SYNDROME ANTIBODS(SSA + SSB)
ENA SSA (RO) Ab: <0.2 AI (ref 0.0–0.9)
ENA SSB (LA) Ab: <0.2 AI (ref 0.0–0.9)

## 2019-10-17 LAB — VITAMIN B1: Thiamine: 171.6 nmol/L (ref 66.5–200.0)

## 2019-10-17 LAB — HEMOGLOBIN A1C
Est. average glucose Bld gHb Est-mCnc: 111 mg/dL
Hgb A1c MFr Bld: 5.5 % (ref 4.8–5.6)

## 2019-10-17 LAB — RHEUMATOID FACTOR: Rheumatoid fact SerPl-aCnc: 13.2 IU/mL (ref 0.0–13.9)

## 2019-10-17 LAB — VITAMIN B6: Vitamin B6: 54.4 ug/L — ABNORMAL HIGH (ref 5.3–46.7)

## 2019-10-17 NOTE — Progress Notes (Signed)
Primary Care Physician: Josetta Huddle, MD Primary Cardiologist: Dr Radford Pax Primary Electrophysiologist: Dr Curt Bears Referring Physician: Dr Blossom Hoops is a 79 y.o. male with a history of coronary artery disease status post CABG, hypertension, hyperlipidemia, paroxysmal atrial fibrillation, carotid stenosis status post left CEA, AAA status post repair who presents for follow up in the [facility]. Patient was started on amiodarone in 2017 when he developed postoperative afib. Patient's amiodarone was stopped 11/07/18 due to bradycardia. On 12/15/18 patient reports that he started having symptoms of fatigue and weakness. Seen by Dr Radford Pax on 12/19/18 and found to be in rate controlled afib. He denies any specific triggers. He does admit to alcohol use. A sleep study is pending. He is on Eliquis for a CHADS2VASC score of 6. Patient presented for DCCV on 12/28/18 but was in [place]. Patient reports he went into afib on 09/14/19. He did not have symptoms but detected and irregular pulse. He checks his pulse daily. EMS was called and ECG confirmed rate controlled afib (personally reviewed). He was not taken to the ED.   On follow up today, patient reports he has done well since his last visit from a cardiac standpoint. He remains in rate controlled afib and is unaware of his arrhythmia. He denies any bleeding issues on anticoagulation. He was stung by a bee a couple days ago and the swelling in his hand is improving.   Today, he denies symptoms of palpitations, chest pain, orthopnea, PND, lower extremity edema, dizziness, presyncope, syncope, snoring, daytime somnolence, bleeding, or neurologic sequela. The patient is tolerating medications without difficulties and is otherwise without complaint today.    Atrial Fibrillation Risk Factors:  he does not have symptoms or diagnosis of sleep apnea. Sleep study negative. he does not have a history of rheumatic fever. he  does have a history of alcohol use. The patient does not have a history of early familial atrial fibrillation or other arrhythmias.  he has a BMI of Body mass index is 25.46 kg/m.[name] Filed Weights   10/18/19 0908  Weight: 87.5 kg    Family History  Problem Relation Age of Onset   Heart disease Mother        AAA  Before age 19   Cancer Mother 33       Colon cancer   Hyperlipidemia Mother    Heart failure Father      Atrial Fibrillation Management history:  Previous antiarrhythmic drugs: amiodarone Previous cardioversions: none Previous ablations: none CHADS2VASC score: 6 Anticoagulation history: Eliquis   Past Medical History:  Diagnosis Date   Arthritis    HANDS AND FEET   Asymptomatic carotid artery stenosis BILATERAL   PER DOPPLER  03/2016  RICA  1 - 39%/  s/p  L CEA  1-39%- followed by Dr. 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   Nocturia    Paroxysmal atrial fibrillation ([place]) 06/30/14   chad2vasc score is at least 4  Popliteal artery aneurysm (HCC) LEFT   PVD (peripheral vascular disease) ([place])    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 2013   Hand   CORONARY ANGIOPLASTY  2002   PTCA  W/ X2 BM STENTS--   PROXIMA LAD  &  LEFT CIRCUMFLEX TO FIRST OBTUSE MARGINAL BRANCH   CORONARY ARTERY BYPASS GRAFT N/A 12/18/2015   Procedure: CORONARY ARTERY BYPASS GRAFTING (CABG) x 3, using left internal mammary artery and bilateral   leg greater saphenous vein harvested endoscopically  LIMA to LAD, SVG to Circumflex, SVG to RCA;  Surgeon: Grace Isaac, MD;  Location: [facility];  Service: Open Heart Surgery;  Laterality: N/A;   CORONARY ARTERY BYPASS GRAFT     3 vessel   ENDARTERECTOMY Left 03/11/2015   Procedure: ENDARTERECTOMY LEFT CAROTID;  Surgeon: Rosetta Posner, MD;  Location: [facility];  Service: Vascular;  Laterality: Left;   EXPLORATORY LAPAROTOMY W/ ENTEROLYSIS FOR PARTIAL SBO  09-23-2008   heart stents  02/19/00   HYDROCELE EXCISION  01/24/2011   Procedure: HYDROCELECTOMY ADULT;  Surgeon: Bernestine Amass, MD;  [facility];  Service: Urology;  Laterality: Left;   Ingrown Toe Nail Right Jan. 2015   Right Great Toe nail   intest blockage  09/22/08   PATCH ANGIOPLASTY Left 03/11/2015   Procedure: PATCH ANGIOPLASTY LEFT CAROTID USING HEMASHIELD PLATINUM FINESSE PATCH;  Surgeon: Rosetta Posner, MD;  Location: [facility];  Service: Vascular;  Laterality: Left;   PULLEY RELEASE -- RIGHT 5TH FINGER  2009   RETINAL DETACHMENT SURGERY  2004   BILATERAL   TEE WITHOUT CARDIOVERSION N/A 12/18/2015   Procedure: TRANSESOPHAGEAL ECHOCARDIOGRAM (TEE);  Surgeon: Grace Isaac, MD;  Location: [facility];  Service: Open Heart Surgery;  Laterality: N/A;   TOOTH EXTRACTION  Dec 2013    Current Outpatient Medications  Medication Sig Dispense Refill   acetaminophen (TYLENOL) 325 MG tablet Take 325 mg by mouth every 4 (four) hours as needed.     acetaminophen  (TYLENOL) 500 MG tablet Take 2 tablets (1,000 mg total) by mouth every 6 (six) hours as needed. 30 tablet 0   amLODipine (NORVASC) 5 MG tablet Take 5 mg by mouth daily.     apixaban (ELIQUIS) 5 MG TABS tablet Take 1 tablet (5 mg total) by mouth 2 (two) times daily. 60 tablet 6   Apoaequorin (PREVAGEN) 10 MG CAPS Take 10 mg by mouth daily.     b complex vitamins tablet Take 1 tablet by mouth daily.       calcium carbonate (OS-CAL) 600 MG TABS Take 600 mg by mouth daily.       Cholecalciferol 1000 UNITS capsule Take 2,000 Units by mouth daily.      cloNIDine (CATAPRES) 0.1 MG tablet Take 0.1 mg by mouth at bedtime as needed.   5   Coenzyme Q10 (CO Q 10) 100 MG CAPS Take  200 mg by mouth daily.      cyanocobalamin 1000 MCG tablet Take 1,000 mcg by mouth daily.     EPINEPHrine (EPIPEN) 0.3 mg/0.3 mL SOAJ injection Inject 0.3 mg into the muscle daily as needed for anaphylaxis (allergic reaction).      finasteride (PROSCAR) 5 MG tablet Take 5 mg by mouth daily.     hydrALAZINE (APRESOLINE) 25 MG tablet Take 25 mg by mouth 3 (three) times daily as needed.      losartan (COZAAR) 50 MG tablet Take 1 tablet (50 mg total) by mouth daily. (Patient taking differently: Take 50 mg by mouth in the morning and at bedtime. ) 90 tablet 3   Magnesium Hydroxide (MAGNESIA PO) Take 1,000 mg by mouth daily.      Multiple Vitamin (MULTIVITAMIN) tablet Take 1 tablet by mouth daily.       Omega-3 Fatty Acids (FISH OIL PO) Take 600 mg by mouth 2 (two) times daily.      potassium gluconate 595 (99 K) MG TABS tablet Take 595 mg by mouth.     rosuvastatin (CRESTOR) 40 MG tablet Take 1 tablet (40 mg total) by mouth every evening. 90 tablet 3   Tamsulosin HCl (FLOMAX) 0.4 MG CAPS Take 0.8 mg by mouth at bedtime.      vitamin C (ASCORBIC ACID) 500 MG tablet Take 2,000 mg by mouth daily.     No current facility-administered medications for this encounter.    Allergies  Allergen Reactions   Wasp Venom  Shortness Of Breath and Swelling   Other    Ramipril Swelling    Social History   Socioeconomic History   Marital status: Married    Spouse name: Not on file   Number of children: 2   Years of education: some college   Highest education level: Not on file  Occupational History   Not on file  Tobacco Use   Smoking status: Former Smoker    Years: 30.00    Types: Cigarettes    Quit date: 01/18/1978    Years since quitting: 41.7   Smokeless tobacco: Former Systems developer    Quit date: 1980  Substance and Sexual Activity   Alcohol use: Not Currently    Alcohol/week: 2.0 standard drinks    Types: 2 Shots of liquor per week    Comment: 2 oz of vodka per day   Drug use: No   Sexual activity: Not on file  Other Topics Concern   Not on file  Social History Narrative   Pt lives in [place] with spouse.     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   Right Handed   No caffeine   Social Determinants of Health   Financial Resource Strain:    Difficulty of Paying Living Expenses: Not on file  Food Insecurity:    Worried About [place] in the Last Year: Not on file   Ran Out of Food in the Last Year: Not on file  Transportation Needs:    Lack of Transportation (Medical): Not on file   Lack of Transportation (Non-Medical): Not on file  Physical Activity:    Days of Exercise per Week: Not on file   Minutes of Exercise per Session: Not on file  Stress:    Feeling of Stress : Not on file  Social Connections:    Frequency of Communication with Friends and Family: Not on file   Frequency of Social Gatherings with Friends and Family: Not  on file   Attends Religious Services: Not on file   Active Member of Clubs or Organizations: Not on file   Attends Archivist Meetings: Not on file   Marital Status: Not on file  Intimate Partner Violence:    Fear of Current or Ex-Partner: Not on file   Emotionally Abused: Not on file    Physically Abused: Not on file   Sexually Abused: Not on file     ROS- All systems are reviewed and negative except as per the HPI above.  Physical Exam: Vitals:   10/18/19 0908  BP: (!) 122/58  Pulse: 60  Weight: 87.5 kg  Height: 6\' 1"  (1.854 m)    GEN- The patient is well appearing elderly male, alert and oriented x 3 today.   HEENT-head normocephalic, atraumatic, sclera clear, conjunctiva pink, hearing intact, trachea midline. Lungs- Clear to ausculation bilaterally, normal work of breathing Heart- irregular rate and rhythm, no murmurs, rubs or gallops  GI- soft, NT, ND, + BS Extremities- no clubbing, cyanosis, or edema MS- no significant deformity or atrophy Skin- no rash or lesion Psych- euthymic mood, full affect Neuro- strength and sensation are intact   Wt Readings from Last 3 Encounters:  10/18/19 87.5 kg  10/09/19 86 kg  09/17/19 86 kg    EKG today demonstrates afib HR 60, LAFB, QRS 120, QTc 442  Echo 01/23/19 demonstrated  Left ventricular ejection fraction, by visual estimation, is 60 to 65%. The left ventricle has normal function. There is mildly increased left ventricular hypertrophy. 2. Left ventricular diastolic parameters are indeterminate. 3. Right ventricular volume/pressure overload. 4. The left ventricle has no regional wall motion abnormalities. 5. Global right ventricle has mildly reduced systolic function.The right ventricular size is mildly enlarged. No increase in right ventricular wall thickness. 6. Left atrial size was moderately dilated. 7. Right atrial size was moderately dilated. 8. The mitral valve is normal in structure. Mild mitral valve regurgitation. 9. The tricuspid valve is normal in structure. 10. The aortic valve is tricuspid. Aortic valve regurgitation is trivial. 11. The pulmonic valve was grossly normal. Pulmonic valve regurgitation is trivial. 12. Mildly elevated pulmonary artery systolic pressure. 13. The atrial septum is  grossly normal. 14. The average left ventricular global longitudinal strain is -19.8 %.  Epic records are reviewed at length today  Assessment and Plan:  1. Permanent atrial fibrillation/atrial tach Patient remains in rate controlled, asymptomatic afib. Given his paucity of symptoms, good rate control, and severely dilated LA, patient would like to continue with a rate control strategy.  Continue Eliquis 5 mg BID  This patients CHA2DS2-VASc Score and unadjusted Ischemic Stroke Rate (% per year) is equal to 9.7 % stroke rate/year from a score of 6  Above score calculated as 1 point each if present [CHF, HTN, DM, Vascular=MI/PAD/Aortic Plaque, Age if 65-74, or Male] Above score calculated as 2 points each if present [Age > 75, or Stroke/TIA/TE]  2. CAD s/p CABG with LIMA to LAD, SVG to RCA and SVG to LCx 12/2015. No anginal symptoms.  3. HTN Stable, no changes today.   Follow up with Dr Radford Pax in 3 months. AF clinic as needed.    [facility] [address] [place], [place] [address] [phone] 10/18/2019 9:35 AM

## 2019-10-18 ENCOUNTER — Other Ambulatory Visit: Payer: Self-pay

## 2019-10-18 ENCOUNTER — Encounter (HOSPITAL_COMMUNITY): Payer: Self-pay | Admitting: Physician Assistant

## 2019-10-18 ENCOUNTER — Ambulatory Visit (HOSPITAL_COMMUNITY)
Admission: RE | Admit: 2019-10-18 | Discharge: 2019-10-18 | Disposition: A | Discharge status: Home / Self Care | Payer: PPO | Source: Ambulatory Visit | Attending: Physician Assistant | Admitting: Physician Assistant

## 2019-10-18 VITALS — BP 122/58 | HR 60 | Ht 73.0 in | Wt 193.0 lb

## 2019-10-18 DIAGNOSIS — I10 Essential (primary) hypertension: Secondary | ICD-10-CM | POA: Insufficient documentation

## 2019-10-18 DIAGNOSIS — Z8673 Personal history of transient ischemic attack (TIA), and cerebral infarction without residual deficits: Secondary | ICD-10-CM | POA: Insufficient documentation

## 2019-10-18 DIAGNOSIS — Z7901 Long term (current) use of anticoagulants: Secondary | ICD-10-CM | POA: Diagnosis not present

## 2019-10-18 DIAGNOSIS — Z79899 Other long term (current) drug therapy: Secondary | ICD-10-CM | POA: Insufficient documentation

## 2019-10-18 DIAGNOSIS — D6869 Other thrombophilia: Secondary | ICD-10-CM | POA: Diagnosis not present

## 2019-10-18 DIAGNOSIS — Z955 Presence of coronary angioplasty implant and graft: Secondary | ICD-10-CM | POA: Diagnosis not present

## 2019-10-18 DIAGNOSIS — Z7289 Other problems related to lifestyle: Secondary | ICD-10-CM | POA: Diagnosis not present

## 2019-10-18 DIAGNOSIS — I4891 Unspecified atrial fibrillation: Secondary | ICD-10-CM | POA: Diagnosis not present

## 2019-10-18 DIAGNOSIS — E785 Hyperlipidemia, unspecified: Secondary | ICD-10-CM | POA: Insufficient documentation

## 2019-10-18 DIAGNOSIS — I739 Peripheral vascular disease, unspecified: Secondary | ICD-10-CM | POA: Diagnosis not present

## 2019-10-18 DIAGNOSIS — Z87891 Personal history of nicotine dependence: Secondary | ICD-10-CM | POA: Insufficient documentation

## 2019-10-18 DIAGNOSIS — Z8249 Family history of ischemic heart disease and other diseases of the circulatory system: Secondary | ICD-10-CM | POA: Insufficient documentation

## 2019-10-18 DIAGNOSIS — I6522 Occlusion and stenosis of left carotid artery: Secondary | ICD-10-CM | POA: Diagnosis not present

## 2019-10-18 DIAGNOSIS — I25810 Atherosclerosis of coronary artery bypass graft(s) without angina pectoris: Secondary | ICD-10-CM | POA: Insufficient documentation

## 2019-10-18 DIAGNOSIS — I4821 Permanent atrial fibrillation: Secondary | ICD-10-CM | POA: Insufficient documentation

## 2019-10-21 ENCOUNTER — Ambulatory Visit: Payer: PPO | Admitting: Vascular Surgery

## 2019-10-23 DIAGNOSIS — I119 Hypertensive heart disease without heart failure: Secondary | ICD-10-CM | POA: Diagnosis not present

## 2019-10-23 DIAGNOSIS — I701 Atherosclerosis of renal artery: Secondary | ICD-10-CM | POA: Diagnosis not present

## 2019-10-23 DIAGNOSIS — M1990 Unspecified osteoarthritis, unspecified site: Secondary | ICD-10-CM | POA: Diagnosis not present

## 2019-10-23 DIAGNOSIS — I63512 Cerebral infarction due to unspecified occlusion or stenosis of left middle cerebral artery: Secondary | ICD-10-CM | POA: Diagnosis not present

## 2019-10-23 DIAGNOSIS — N400 Enlarged prostate without lower urinary tract symptoms: Secondary | ICD-10-CM | POA: Diagnosis not present

## 2019-10-23 DIAGNOSIS — I4891 Unspecified atrial fibrillation: Secondary | ICD-10-CM | POA: Diagnosis not present

## 2019-10-23 DIAGNOSIS — M170 Bilateral primary osteoarthritis of knee: Secondary | ICD-10-CM | POA: Diagnosis not present

## 2019-10-23 DIAGNOSIS — I2510 Atherosclerotic heart disease of native coronary artery without angina pectoris: Secondary | ICD-10-CM | POA: Diagnosis not present

## 2019-10-23 DIAGNOSIS — N183 Chronic kidney disease, stage 3 unspecified: Secondary | ICD-10-CM | POA: Diagnosis not present

## 2019-10-23 DIAGNOSIS — I10 Essential (primary) hypertension: Secondary | ICD-10-CM | POA: Diagnosis not present

## 2019-10-23 DIAGNOSIS — E785 Hyperlipidemia, unspecified: Secondary | ICD-10-CM | POA: Diagnosis not present

## 2019-10-23 DIAGNOSIS — E7800 Pure hypercholesterolemia, unspecified: Secondary | ICD-10-CM | POA: Diagnosis not present

## 2019-10-28 ENCOUNTER — Encounter: Payer: Self-pay | Admitting: Vascular Surgery

## 2019-10-28 ENCOUNTER — Other Ambulatory Visit: Payer: Self-pay

## 2019-10-28 ENCOUNTER — Ambulatory Visit: Payer: PPO | Admitting: Vascular Surgery

## 2019-10-28 VITALS — BP 151/84 | HR 67 | Temp 97.2°F | Resp 14 | Ht 73.0 in | Wt 190.0 lb

## 2019-10-28 DIAGNOSIS — I739 Peripheral vascular disease, unspecified: Secondary | ICD-10-CM

## 2019-10-28 DIAGNOSIS — I6523 Occlusion and stenosis of bilateral carotid arteries: Secondary | ICD-10-CM

## 2019-10-28 NOTE — Progress Notes (Signed)
[facility] [place]  Patient name: [name] MRN: [id number] DOB: 1940-10-13 Sex: male  REASON FOR VISIT: Follow-up diffuse peripheral vascular disease  HPI: [name] is a 79 y.o. male here today for follow-up.  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  Has had no difficulty associated with this.  Also underwent left carotid endarterectomy on 2017.  He has known moderate right carotid stenosis.  Also underwent coronary artery bypass grafting in 2017.  He is in chronic atrial fibrillation on Eliquis.  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  He reports stable claudication symptoms.  He is contemplating bilateral staged total hip replacement in the next 3 to 4 months.  He is concerned regarding the effect this may have on his claudication and I stated that I would treat these independently and do not see any contraindication from a vascular standpoint  Past Medical History:  Diagnosis Date  . Arthritis    HANDS AND FEET  . Asymptomatic carotid artery stenosis BILATERAL   PER DOPPLER  03/2016  RICA  1 - 39%/  s/p  L CEA  1-39%- followed by Dr. Donnetta Hutching  . CAD (coronary artery disease)    s/p remote stenting, s/p CABG with LIMA to LAD, SVG to RCA and SVG to LCx 12/2015  . Cataract immature BILATERAL   . Diverticulosis   . 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 History of diverticulosis    Noted on Colonoscopy  . History of inguinal herniorrhaphy   . Hydrocele, left   . Hyperlipidemia   . Hypertension   . Increased intraocular pressure   . Lumbar degenerative disc disease 03/2017  . Nocturia   . Paroxysmal atrial fibrillation ([place]) 06/30/14   chad2vasc score is at least 4  . 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 PVD (peripheral vascular disease) ([place])   . 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  Retinal detachment   . SBO (small bowel obstruction) ([place])   . Stroke [facility])    mini stroke/2017    Family History  Problem Relation Age of Onset  . Heart disease Mother        AAA  Before age 41  . Cancer Mother 56       Colon cancer  . Hyperlipidemia Mother   . Heart failure Father     SOCIAL HISTORY: Social History   Tobacco Use  . Smoking status: Former Smoker    Years: 30.00    Types: Cigarettes    Quit date: 01/18/1978    Years since quitting: 41.8  . Smokeless tobacco: Former Systems developer    Quit date: 1980  Substance Use Topics  . Alcohol use: Not Currently    Alcohol/week: 2.0 standard drinks    Types: 2 Shots of liquor per week    Comment: 2 oz of vodka per day    Allergies  Allergen Reactions  . Wasp Venom Shortness Of Breath and Swelling  . Other   . Ramipril Swelling    Current Outpatient Medications  Medication Sig Dispense Refill  . acetaminophen (TYLENOL) 325 MG tablet Take 325 mg by mouth every 4 (four) hours as needed.    [name] acetaminophen (TYLENOL) 500 MG tablet Take 2 tablets (1,000 mg total) by mouth every 6 (six) hours as needed. 30 tablet 0  . amLODipine (NORVASC) 5 MG tablet Take 5 mg by mouth daily.    [name]  apixaban (ELIQUIS) 5 MG TABS tablet Take 1 tablet (5 mg total) by mouth 2 (two) times daily. 60 tablet 6  . Apoaequorin (PREVAGEN) 10 MG CAPS Take 10 mg by mouth daily.    [name] b complex vitamins tablet Take 1 tablet by mouth daily.      . calcium carbonate (OS-CAL) 600 MG TABS Take 600 mg by mouth daily.      . Cholecalciferol 1000 UNITS capsule Take 2,000 Units by mouth daily.     . cloNIDine (CATAPRES) 0.1 MG tablet Take 0.1 mg by mouth at bedtime as needed.   5  . Coenzyme Q10 (CO Q 10) 100 MG CAPS Take 200 mg by mouth daily.     . cyanocobalamin 1000 MCG tablet Take 1,000 mcg by mouth daily.    [name] EPINEPHrine (EPIPEN) 0.3 mg/0.3 mL SOAJ injection Inject 0.3 mg into the muscle daily as needed for anaphylaxis (allergic reaction).     . finasteride  (PROSCAR) 5 MG tablet Take 5 mg by mouth daily.    . hydrALAZINE (APRESOLINE) 25 MG tablet Take 25 mg by mouth 3 (three) times daily as needed.     . Magnesium Hydroxide (MAGNESIA PO) Take 1,000 mg by mouth daily.     . Multiple Vitamin (MULTIVITAMIN) tablet Take 1 tablet by mouth daily.      . Omega-3 Fatty Acids (FISH OIL PO) Take 600 mg by mouth 2 (two) times daily.     . potassium gluconate 595 (99 K) MG TABS tablet Take 595 mg by mouth.    . rosuvastatin (CRESTOR) 40 MG tablet Take 1 tablet (40 mg total) by mouth every evening. 90 tablet 3  . Tamsulosin HCl (FLOMAX) 0.4 MG CAPS Take 0.8 mg by mouth at bedtime.     . vitamin C (ASCORBIC ACID) 500 MG tablet Take 2,000 mg by mouth daily.    [name] losartan (COZAAR) 50 MG tablet Take 1 tablet (50 mg total) by mouth daily. (Patient taking differently: Take 50 mg by mouth in the morning and at bedtime. ) 90 tablet 3   No current facility-administered medications for this visit.    REVIEW OF SYSTEMS:  [X]  denotes positive finding, [ ]  denotes negative finding Cardiac  Comments:  Chest pain or chest pressure:    Shortness of breath upon exertion:    Short of breath when lying flat:    Irregular heart rhythm: x       Vascular    Pain in calf, thigh, or hip brought on by ambulation: x   Pain in feet at night that wakes you up from your sleep:     Blood clot in your veins:    Leg swelling:           PHYSICAL EXAM: Vitals:   10/28/19 1536  BP: (!) 151/84  Pulse: 67  Resp: 14  Temp: (!) 97.2 F (36.2 C)  TempSrc: Other (Comment)  SpO2: 96%  Weight: 190 lb (86.2 kg)  Height: 6\' 1"  (1.854 m)    GENERAL: The patient is a well-nourished male, in no acute distress. The vital signs are documented above. CARDIOVASCULAR: Well-healed left neck incision.  No carotid bruits bilaterally 2+ radial and 2+ femoral pulses bilaterally.  Absent popliteal and distal pulses bilaterally PULMONARY: There is good air exchange  MUSCULOSKELETAL: There are  no major deformities or cyanosis. NEUROLOGIC: No focal weakness or paresthesias are detected. SKIN: There are no ulcers or rashes noted. PSYCHIATRIC: The patient has a normal affect.  DATA:  Noninvasive studies from 10/08/2019 were reviewed with the patient.  This reveals no change in his moderate right carotid stenosis in the 50 to 69% range.  Left endarterectomy site is widely paten ankle arm index is 0.74 on the right and 0.83 on the left with superficial femoral artery occlusive disease  MEDICAL ISSUES: Stable overall.  We will continue his usual walking program activity.  We will see him again in 1 year with repeat carotid duplex and repeat ankle arm index.  We will also repeat his popliteal duplex for follow-up of ectasia in his popliteal arteries    Rosetta Posner, MD FACS [facility] Tel [phone] Pager [phone]

## 2019-10-30 ENCOUNTER — Telehealth: Payer: Self-pay

## 2019-10-30 NOTE — Telephone Encounter (Signed)
Telephone call from patient stating he reviewed his AVS online from office visit on 10/28/19 and noted it had a history of smokeless tobacco use. Patient verbalized that he has never used smokeless tobacco and requested records updated. Updated patients record to never used smokeless tobacco. Pt voiced understanding.

## 2020-01-15 ENCOUNTER — Other Ambulatory Visit: Payer: Self-pay

## 2020-01-15 ENCOUNTER — Encounter: Payer: Self-pay | Admitting: Cardiology

## 2020-01-15 ENCOUNTER — Ambulatory Visit: Payer: PPO | Admitting: Cardiology

## 2020-01-15 VITALS — BP 122/66 | HR 73 | Ht 73.0 in | Wt 186.6 lb

## 2020-01-15 DIAGNOSIS — I4819 Other persistent atrial fibrillation: Secondary | ICD-10-CM

## 2020-01-15 DIAGNOSIS — I10 Essential (primary) hypertension: Secondary | ICD-10-CM

## 2020-01-15 DIAGNOSIS — I6521 Occlusion and stenosis of right carotid artery: Secondary | ICD-10-CM

## 2020-01-15 DIAGNOSIS — E7800 Pure hypercholesterolemia, unspecified: Secondary | ICD-10-CM

## 2020-01-15 DIAGNOSIS — I2510 Atherosclerotic heart disease of native coronary artery without angina pectoris: Secondary | ICD-10-CM

## 2020-01-15 DIAGNOSIS — R001 Bradycardia, unspecified: Secondary | ICD-10-CM | POA: Diagnosis not present

## 2020-01-15 LAB — COMPREHENSIVE METABOLIC PANEL
ALT: 24 IU/L (ref 0–44)
AST: 21 IU/L (ref 0–40)
Albumin/Globulin Ratio: 2.1 (ref 1.2–2.2)
Albumin: 4.4 g/dL (ref 3.6–4.6)
Alkaline Phosphatase: 42 IU/L — ABNORMAL LOW (ref 44–121)
BUN/Creatinine Ratio: 19 (ref 10–24)
BUN: 25 mg/dL (ref 8–27)
Bilirubin Total: 0.5 mg/dL (ref 0.0–1.2)
CO2: 24 mmol/L (ref 20–29)
Calcium: 9.9 mg/dL (ref 8.6–10.2)
Chloride: 104 mmol/L (ref 96–106)
Creatinine, Ser: 1.35 mg/dL — ABNORMAL HIGH (ref 0.76–1.27)
GFR calc Af Amer: 57 mL/min/{1.73_m2} — ABNORMAL LOW (ref >59)
GFR calc non Af Amer: 49 mL/min/{1.73_m2} — ABNORMAL LOW (ref >59)
Globulin, Total: 2.1 g/dL (ref 1.5–4.5)
Glucose: 87 mg/dL (ref 65–99)
Potassium: 4.4 mmol/L (ref 3.5–5.2)
Sodium: 141 mmol/L (ref 134–144)
Total Protein: 6.5 g/dL (ref 6.0–8.5)

## 2020-01-15 LAB — LIPID PANEL
Chol/HDL Ratio: 2.7 ratio (ref 0.0–5.0)
Cholesterol, Total: 139 mg/dL (ref 100–199)
HDL: 52 mg/dL (ref >39)
LDL Chol Calc (NIH): 73 mg/dL (ref 0–99)
Triglycerides: 69 mg/dL (ref 0–149)
VLDL Cholesterol Cal: 14 mg/dL (ref 5–40)

## 2020-01-15 NOTE — Patient Instructions (Signed)
Medication Instructions:  Your physician recommends that you continue on your current medications as directed. Please refer to the Current Medication list given to you today.  *If you need a refill on your cardiac medications before your next appointment, please call your pharmacy*   Lab Work: TODAY: Fasting lipids and CMET If you have labs (blood work) drawn today and your tests are completely normal, you will receive your results only by: [name] MyChart Message (if you have MyChart) OR . A paper copy in the mail If you have any lab test that is abnormal or we need to change your treatment, we will call you to review the results.  Follow-Up: At [facility], you and your health needs are our priority.  As part of our continuing mission to provide you with exceptional heart care, we have created designated Provider Care Teams.  These Care Teams include your primary Cardiologist (physician) and Advanced Practice Providers (APPs -  Physician Assistants and Nurse Practitioners) who all work together to provide you with the care you need, when you need it.  We recommend signing up for the patient portal called "MyChart".  Sign up information is provided on this After Visit Summary.  MyChart is used to connect with patients for Virtual Visits (Telemedicine).  Patients are able to view lab/test results, encounter notes, upcoming appointments, etc.  Non-urgent messages can be sent to your provider as well.   To learn more about what you can do with MyChart, go to [email or web address].    Your next appointment:   1 year(s)  The format for your next appointment:   In Person  Provider:   You may see Armanda Magic, MD or one of the following Advanced Practice Providers on your designated Care Team:    Ronie Spies, PA-C  Jacolyn Reedy, PA-C

## 2020-01-15 NOTE — Addendum Note (Signed)
Addended by: Theresia Majors on: 01/15/2020 08:41 AM   Modules accepted: Orders

## 2020-01-15 NOTE — Progress Notes (Signed)
Date:  01/15/2020   ID:  [name], DOB October 18, 1940, MRN [id number]   PCP:  Marden Noble, MD  Cardiologist:  Armanda Magic, MD  Electrophysiologist:  None   Chief Complaint:  Atrial fibrillation, HTN, CAD, HLD  History of Present Illness:    [name] is a 79 y.o. male with a hx of CAD (s/p CABG with LIMA to LAD, SVG to RCA and SVG to LCx 12/2015), HTN, dyslipidemia,PAF on chronic anticoagulation with Eliquis, carotid artery stenosis s/p L CEA, AAA s/p repair and followed by Dr. Arbie Cookey. He has a hx of nocturnal bradycardia and nonsustained atrial tachycardia and sleep study was recommended by  Dr. Elberta Fortis.  He underwent PSG on 01/06/2019 and showed no significant OSA with an AHI of 0.2/hr with no significant oxygen desaturations.    He is here today for followup and is doing well.  He denies any chest pain or pressure, SOB, DOE, PND, orthopnea, LE edema (except with long car rides), dizziness, palpitations or syncope. He is compliant with his meds and is tolerating meds with no SE.    Prior CV studies:   The following studies were reviewed today:  Outside labs from PCP  Past Medical History:  Diagnosis Date  . Arthritis    HANDS AND FEET  . Asymptomatic carotid artery stenosis BILATERAL   PER DOPPLER  03/2016  RICA  1 - 39%/  s/p  L CEA  1-39%- followed by Dr. Arbie Cookey  . CAD (coronary artery disease)    s/p remote stenting, s/p CABG with LIMA to LAD, SVG to RCA and SVG to LCx 12/2015  . Cataract immature BILATERAL   . Diverticulosis   . 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 History of diverticulosis    Noted on Colonoscopy  . History of inguinal herniorrhaphy   . Hydrocele, left   . Hyperlipidemia   . Hypertension   . Increased intraocular pressure   . Lumbar degenerative disc disease 03/2017  . Nocturia   . Paroxysmal atrial fibrillation (HCC) 06/30/14   chad2vasc score is at least 4  . 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 PVD (peripheral vascular disease) ([facility])   . 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 Retinal detachment   . SBO (small bowel obstruction) ([facility])   . Stroke [facility])    mini stroke/2017   Past Surgical History:  Procedure Laterality Date  . AAA REPAIR W/ AORTOBIFEMORAL BYPASS  OCT  83151  . ABDOMINAL HERNIA REPAIR  X4  (LAST ONE 1990'S)   inguinal herniorrhaphy  . CARDIAC CATHETERIZATION N/A 12/16/2015   Procedure: Left Heart Cath and Coronary Angiography;  Surgeon: Iran Ouch, MD;  Location: [facility] INVASIVE CV LAB;  Service: Cardiovascular;  Laterality: N/A;  . CAROTID ENDARTERECTOMY    . CARPAL TUNNEL RELEASE Right Oct. 2013   Hand  . CORONARY ANGIOPLASTY  2002   PTCA  W/ X2 BM STENTS--   PROXIMA LAD  &  LEFT CIRCUMFLEX TO FIRST OBTUSE MARGINAL BRANCH  . CORONARY ARTERY BYPASS GRAFT N/A 12/18/2015   Procedure: CORONARY ARTERY BYPASS GRAFTING (CABG) x 3, using left internal mammary artery and bilateral   leg greater saphenous vein harvested endoscopically  LIMA to LAD, SVG to Circumflex, SVG to RCA;  Surgeon: Delight Ovens, MD;  Location: [facility] OR;  Service: Open Heart Surgery;  Laterality: N/A;  . CORONARY ARTERY BYPASS GRAFT     3 vessel  . ENDARTERECTOMY Left  03/11/2015   Procedure: ENDARTERECTOMY LEFT CAROTID;  Surgeon: Rosetta Posner, MD;  Location: [place];  Service: Vascular;  Laterality: Left;  . EXPLORATORY LAPAROTOMY W/ ENTEROLYSIS FOR PARTIAL SBO  09-23-2008  . heart stents  02/19/00  . HYDROCELE EXCISION  01/24/2011   Procedure: HYDROCELECTOMY ADULT;  Surgeon: Bernestine Amass, MD;  [facility];  Service: Urology;  Laterality: Left;  . Ingrown Toe Nail Right Jan. 2015   Right Great Toe nail  . intest blockage  09/22/08  . PATCH ANGIOPLASTY Left 03/11/2015   Procedure: PATCH ANGIOPLASTY LEFT CAROTID USING HEMASHIELD PLATINUM FINESSE PATCH;  Surgeon: Rosetta Posner, MD;  Location: [place];  Service: Vascular;  Laterality: Left;  . PULLEY RELEASE --  RIGHT 5TH FINGER  2009  . RETINAL DETACHMENT SURGERY  2004   BILATERAL  . TEE WITHOUT CARDIOVERSION N/A 12/18/2015   Procedure: TRANSESOPHAGEAL ECHOCARDIOGRAM (TEE);  Surgeon: Grace Isaac, MD;  Location: [place];  Service: Open Heart Surgery;  Laterality: N/A;  . TOOTH EXTRACTION  Dec 2013     Current Meds  Medication Sig  . acetaminophen (TYLENOL) 325 MG tablet Take 325 mg by mouth every 4 (four) hours as needed.  [name] acetaminophen (TYLENOL) 500 MG tablet Take 2 tablets (1,000 mg total) by mouth every 6 (six) hours as needed.  [name] amLODipine (NORVASC) 5 MG tablet Take 5 mg by mouth daily.  [name] apixaban (ELIQUIS) 5 MG TABS tablet Take 1 tablet (5 mg total) by mouth 2 (two) times daily.  [name] Apoaequorin (PREVAGEN) 10 MG CAPS Take 10 mg by mouth daily.  [name] b complex vitamins tablet Take 1 tablet by mouth daily.  . calcium carbonate (OS-CAL) 600 MG TABS Take 600 mg by mouth daily.  . Cholecalciferol 1000 UNITS capsule Take 2,000 Units by mouth daily.   . cloNIDine (CATAPRES) 0.1 MG tablet Take 0.1 mg by mouth at bedtime as needed.   . Coenzyme Q10 (CO Q 10) 100 MG CAPS Take 200 mg by mouth daily.   . cyanocobalamin 1000 MCG tablet Take 1,000 mcg by mouth daily.  [name] EPINEPHrine 0.3 mg/0.3 mL IJ SOAJ injection Inject 0.3 mg into the muscle daily as needed for anaphylaxis (allergic reaction).   . finasteride (PROSCAR) 5 MG tablet Take 5 mg by mouth daily.  . hydrALAZINE (APRESOLINE) 25 MG tablet Take 25 mg by mouth 3 (three) times daily as needed.   . Magnesium Hydroxide (MAGNESIA PO) Take 1,000 mg by mouth daily.  . Multiple Vitamin (MULTIVITAMIN) tablet Take 1 tablet by mouth daily.  . Omega-3 Fatty Acids (FISH OIL PO) Take 600 mg by mouth 2 (two) times daily.   . potassium gluconate 595 (99 K) MG TABS tablet Take 595 mg by mouth.  . rosuvastatin (CRESTOR) 40 MG tablet Take 1 tablet (40 mg total) by mouth every evening.  . Tamsulosin HCl (FLOMAX) 0.4 MG CAPS Take 0.8 mg by mouth at bedtime.  .  vitamin C (ASCORBIC ACID) 500 MG tablet Take 2,000 mg by mouth daily.     Allergies:   Wasp venom, Other, and Ramipril   Social History   Tobacco Use  . Smoking status: Former Smoker    Years: 30.00    Types: Cigarettes    Quit date: 01/18/1978    Years since quitting: 42.0  . Smokeless tobacco: Never Used  Substance Use Topics  . Alcohol use: Not Currently    Alcohol/week: 2.0 standard drinks    Types: 2 Shots of  liquor per week    Comment: 2 oz of vodka per day  . Drug use: No     Family Hx: The patient's family history includes Cancer (age of onset: 77) in his mother; Heart disease in his mother; Heart failure in his father; Hyperlipidemia in his mother.  ROS:   Please see the history of present illness.     All other systems reviewed and are negative.   Labs/Other Tests and Data Reviewed:    Recent Labs: No results found for requested labs within last 8760 hours.   Recent Lipid Panel Lab Results  Component Value Date/Time   CHOL 155 11/08/2017 10:02 AM   CHOL 164 05/24/2013 09:16 AM   TRIG 27 11/08/2017 10:02 AM   TRIG 50 05/24/2013 09:16 AM   HDL 90 11/08/2017 10:02 AM   HDL 84 05/24/2013 09:16 AM   CHOLHDL 1.7 11/08/2017 10:02 AM   CHOLHDL 2.4 12/16/2015 02:18 AM   LDLCALC 60 11/08/2017 10:02 AM   LDLCALC 70 05/24/2013 09:16 AM    Wt Readings from Last 3 Encounters:  01/15/20 186 lb 9.6 oz (84.6 kg)  10/28/19 190 lb (86.2 kg)  10/18/19 193 lb (87.5 kg)     Objective:    Vital Signs:  BP 122/66   Pulse 73   Ht 6\' 1"  (1.854 m)   Wt 186 lb 9.6 oz (84.6 kg)   SpO2 98%   BMI 24.62 kg/m    GEN: Well nourished, well developed in no acute distress HEENT: Normal NECK: No JVD; No carotid bruits LYMPHATICS: No lymphadenopathy CARDIAC:RRR, no murmurs, rubs, gallops RESPIRATORY:  Clear to auscultation without rales, wheezing or rhonchi  ABDOMEN: Soft, non-tender, non-distended MUSCULOSKELETAL:  No edema; No deformity  SKIN: Warm and dry NEUROLOGIC:   Alert and oriented x 3 PSYCHIATRIC:  Normal affect    ASSESSMENT & PLAN:    1.  Persistent atrial fibrillation -he is maintaining NSR with no palpitations -no BB due to bradycardia - amio also stopped due to this -he has not had any bleeding problems on the DOAC -continue Apixaban 5mg  BID. -SCr 1.25 in June 2021 and Hbg 12.7  2.  ASCAD -s/p CABG with LIMA to LAD, SVG to RCA and SVG to LCx 12/2015 -he has not had any anginal sx since I saw him last -continue statin -no BB due to bradycardia -no ASA due to DOAC  3.  HLD -LDL goal < 70 -LDL was 75 in June -repeat FLP and ALT -continue Crestor 40mg  daily  4.  HTN -BP adequately controlled on exam today -continue Amlodipine10mg  daily, Clonidine 0.1mg  qhs PRN, Losartan 50mg  BID, Hydralazine 25mg  TID  5.  Right carotid artery stenosis/PVD LE -s/p LCEA -dopplers 2018 with 1-39% right carotid stenosis -followed by Dr. Donnetta Hutching -continue statin  6.  Nocturnal Bradycardia -sleep study showed no sleep apnea -asymptomatic   Medication Adjustments/Labs and Tests Ordered: Current medicines are reviewed at length with the patient today.  Concerns regarding medicines are outlined above.  Tests Ordered: No orders of the defined types were placed in this encounter.  Medication Changes: No orders of the defined types were placed in this encounter.   Disposition:  Follow up in 6 month(s)  Signed, Fransico Him, MD  01/15/2020 8:32 AM     [facility]

## 2020-01-16 DIAGNOSIS — N400 Enlarged prostate without lower urinary tract symptoms: Secondary | ICD-10-CM | POA: Diagnosis not present

## 2020-01-16 DIAGNOSIS — I119 Hypertensive heart disease without heart failure: Secondary | ICD-10-CM | POA: Diagnosis not present

## 2020-01-16 DIAGNOSIS — N183 Chronic kidney disease, stage 3 unspecified: Secondary | ICD-10-CM | POA: Diagnosis not present

## 2020-01-16 DIAGNOSIS — I2510 Atherosclerotic heart disease of native coronary artery without angina pectoris: Secondary | ICD-10-CM | POA: Diagnosis not present

## 2020-01-16 DIAGNOSIS — I10 Essential (primary) hypertension: Secondary | ICD-10-CM | POA: Diagnosis not present

## 2020-01-16 DIAGNOSIS — E7800 Pure hypercholesterolemia, unspecified: Secondary | ICD-10-CM | POA: Diagnosis not present

## 2020-01-16 DIAGNOSIS — I63512 Cerebral infarction due to unspecified occlusion or stenosis of left middle cerebral artery: Secondary | ICD-10-CM | POA: Diagnosis not present

## 2020-01-16 DIAGNOSIS — M1990 Unspecified osteoarthritis, unspecified site: Secondary | ICD-10-CM | POA: Diagnosis not present

## 2020-01-16 DIAGNOSIS — I701 Atherosclerosis of renal artery: Secondary | ICD-10-CM | POA: Diagnosis not present

## 2020-01-16 DIAGNOSIS — I4891 Unspecified atrial fibrillation: Secondary | ICD-10-CM | POA: Diagnosis not present

## 2020-01-16 DIAGNOSIS — E785 Hyperlipidemia, unspecified: Secondary | ICD-10-CM | POA: Diagnosis not present

## 2020-02-17 DIAGNOSIS — I2510 Atherosclerotic heart disease of native coronary artery without angina pectoris: Secondary | ICD-10-CM | POA: Diagnosis not present

## 2020-02-17 DIAGNOSIS — E7800 Pure hypercholesterolemia, unspecified: Secondary | ICD-10-CM | POA: Diagnosis not present

## 2020-02-17 DIAGNOSIS — I119 Hypertensive heart disease without heart failure: Secondary | ICD-10-CM | POA: Diagnosis not present

## 2020-02-17 DIAGNOSIS — E785 Hyperlipidemia, unspecified: Secondary | ICD-10-CM | POA: Diagnosis not present

## 2020-02-17 DIAGNOSIS — N400 Enlarged prostate without lower urinary tract symptoms: Secondary | ICD-10-CM | POA: Diagnosis not present

## 2020-02-17 DIAGNOSIS — I63512 Cerebral infarction due to unspecified occlusion or stenosis of left middle cerebral artery: Secondary | ICD-10-CM | POA: Diagnosis not present

## 2020-02-17 DIAGNOSIS — I10 Essential (primary) hypertension: Secondary | ICD-10-CM | POA: Diagnosis not present

## 2020-02-17 DIAGNOSIS — I4891 Unspecified atrial fibrillation: Secondary | ICD-10-CM | POA: Diagnosis not present

## 2020-02-17 DIAGNOSIS — I701 Atherosclerosis of renal artery: Secondary | ICD-10-CM | POA: Diagnosis not present

## 2020-02-17 DIAGNOSIS — N183 Chronic kidney disease, stage 3 unspecified: Secondary | ICD-10-CM | POA: Diagnosis not present

## 2020-02-25 DIAGNOSIS — E785 Hyperlipidemia, unspecified: Secondary | ICD-10-CM | POA: Diagnosis not present

## 2020-02-25 DIAGNOSIS — I4891 Unspecified atrial fibrillation: Secondary | ICD-10-CM | POA: Diagnosis not present

## 2020-02-25 DIAGNOSIS — I63512 Cerebral infarction due to unspecified occlusion or stenosis of left middle cerebral artery: Secondary | ICD-10-CM | POA: Diagnosis not present

## 2020-02-25 DIAGNOSIS — I701 Atherosclerosis of renal artery: Secondary | ICD-10-CM | POA: Diagnosis not present

## 2020-02-25 DIAGNOSIS — I2510 Atherosclerotic heart disease of native coronary artery without angina pectoris: Secondary | ICD-10-CM | POA: Diagnosis not present

## 2020-02-25 DIAGNOSIS — I10 Essential (primary) hypertension: Secondary | ICD-10-CM | POA: Diagnosis not present

## 2020-02-25 DIAGNOSIS — N183 Chronic kidney disease, stage 3 unspecified: Secondary | ICD-10-CM | POA: Diagnosis not present

## 2020-02-25 DIAGNOSIS — I119 Hypertensive heart disease without heart failure: Secondary | ICD-10-CM | POA: Diagnosis not present

## 2020-02-25 DIAGNOSIS — M159 Polyosteoarthritis, unspecified: Secondary | ICD-10-CM | POA: Diagnosis not present

## 2020-02-25 DIAGNOSIS — E7800 Pure hypercholesterolemia, unspecified: Secondary | ICD-10-CM | POA: Diagnosis not present

## 2020-02-25 DIAGNOSIS — N400 Enlarged prostate without lower urinary tract symptoms: Secondary | ICD-10-CM | POA: Diagnosis not present

## 2020-03-03 DIAGNOSIS — L821 Other seborrheic keratosis: Secondary | ICD-10-CM | POA: Diagnosis not present

## 2020-03-03 DIAGNOSIS — D2261 Melanocytic nevi of right upper limb, including shoulder: Secondary | ICD-10-CM | POA: Diagnosis not present

## 2020-03-03 DIAGNOSIS — D2262 Melanocytic nevi of left upper limb, including shoulder: Secondary | ICD-10-CM | POA: Diagnosis not present

## 2020-03-03 DIAGNOSIS — D692 Other nonthrombocytopenic purpura: Secondary | ICD-10-CM | POA: Diagnosis not present

## 2020-03-03 DIAGNOSIS — L814 Other melanin hyperpigmentation: Secondary | ICD-10-CM | POA: Diagnosis not present

## 2020-03-03 DIAGNOSIS — D225 Melanocytic nevi of trunk: Secondary | ICD-10-CM | POA: Diagnosis not present

## 2020-03-03 DIAGNOSIS — L570 Actinic keratosis: Secondary | ICD-10-CM | POA: Diagnosis not present

## 2020-03-03 DIAGNOSIS — Z85828 Personal history of other malignant neoplasm of skin: Secondary | ICD-10-CM | POA: Diagnosis not present

## 2020-03-18 DIAGNOSIS — N183 Chronic kidney disease, stage 3 unspecified: Secondary | ICD-10-CM | POA: Diagnosis not present

## 2020-03-18 DIAGNOSIS — E785 Hyperlipidemia, unspecified: Secondary | ICD-10-CM | POA: Diagnosis not present

## 2020-03-18 DIAGNOSIS — M159 Polyosteoarthritis, unspecified: Secondary | ICD-10-CM | POA: Diagnosis not present

## 2020-03-18 DIAGNOSIS — I2510 Atherosclerotic heart disease of native coronary artery without angina pectoris: Secondary | ICD-10-CM | POA: Diagnosis not present

## 2020-03-18 DIAGNOSIS — N400 Enlarged prostate without lower urinary tract symptoms: Secondary | ICD-10-CM | POA: Diagnosis not present

## 2020-03-18 DIAGNOSIS — E7800 Pure hypercholesterolemia, unspecified: Secondary | ICD-10-CM | POA: Diagnosis not present

## 2020-03-18 DIAGNOSIS — I63512 Cerebral infarction due to unspecified occlusion or stenosis of left middle cerebral artery: Secondary | ICD-10-CM | POA: Diagnosis not present

## 2020-03-18 DIAGNOSIS — I4891 Unspecified atrial fibrillation: Secondary | ICD-10-CM | POA: Diagnosis not present

## 2020-03-18 DIAGNOSIS — I119 Hypertensive heart disease without heart failure: Secondary | ICD-10-CM | POA: Diagnosis not present

## 2020-03-18 DIAGNOSIS — I10 Essential (primary) hypertension: Secondary | ICD-10-CM | POA: Diagnosis not present

## 2020-04-17 DIAGNOSIS — I701 Atherosclerosis of renal artery: Secondary | ICD-10-CM | POA: Diagnosis not present

## 2020-04-17 DIAGNOSIS — N400 Enlarged prostate without lower urinary tract symptoms: Secondary | ICD-10-CM | POA: Diagnosis not present

## 2020-04-17 DIAGNOSIS — M159 Polyosteoarthritis, unspecified: Secondary | ICD-10-CM | POA: Diagnosis not present

## 2020-04-17 DIAGNOSIS — E785 Hyperlipidemia, unspecified: Secondary | ICD-10-CM | POA: Diagnosis not present

## 2020-04-17 DIAGNOSIS — E7800 Pure hypercholesterolemia, unspecified: Secondary | ICD-10-CM | POA: Diagnosis not present

## 2020-04-17 DIAGNOSIS — I10 Essential (primary) hypertension: Secondary | ICD-10-CM | POA: Diagnosis not present

## 2020-04-17 DIAGNOSIS — I2510 Atherosclerotic heart disease of native coronary artery without angina pectoris: Secondary | ICD-10-CM | POA: Diagnosis not present

## 2020-04-17 DIAGNOSIS — I119 Hypertensive heart disease without heart failure: Secondary | ICD-10-CM | POA: Diagnosis not present

## 2020-04-17 DIAGNOSIS — I4891 Unspecified atrial fibrillation: Secondary | ICD-10-CM | POA: Diagnosis not present

## 2020-04-17 DIAGNOSIS — I63512 Cerebral infarction due to unspecified occlusion or stenosis of left middle cerebral artery: Secondary | ICD-10-CM | POA: Diagnosis not present

## 2020-04-17 DIAGNOSIS — N183 Chronic kidney disease, stage 3 unspecified: Secondary | ICD-10-CM | POA: Diagnosis not present

## 2020-04-21 ENCOUNTER — Ambulatory Visit (HOSPITAL_COMMUNITY)
Admission: RE | Admit: 2020-04-21 | Discharge: 2020-04-21 | Disposition: A | Discharge status: Home / Self Care | Payer: PPO | Source: Ambulatory Visit | Attending: Physician Assistant | Admitting: Physician Assistant

## 2020-04-21 ENCOUNTER — Other Ambulatory Visit: Payer: Self-pay

## 2020-04-21 ENCOUNTER — Encounter (HOSPITAL_COMMUNITY): Payer: Self-pay | Admitting: Physician Assistant

## 2020-04-21 VITALS — BP 134/74 | HR 57 | Ht 73.0 in | Wt 178.0 lb

## 2020-04-21 DIAGNOSIS — Z79899 Other long term (current) drug therapy: Secondary | ICD-10-CM | POA: Insufficient documentation

## 2020-04-21 DIAGNOSIS — Z7901 Long term (current) use of anticoagulants: Secondary | ICD-10-CM | POA: Insufficient documentation

## 2020-04-21 DIAGNOSIS — I10 Essential (primary) hypertension: Secondary | ICD-10-CM | POA: Insufficient documentation

## 2020-04-21 DIAGNOSIS — Z87891 Personal history of nicotine dependence: Secondary | ICD-10-CM | POA: Insufficient documentation

## 2020-04-21 DIAGNOSIS — D6869 Other thrombophilia: Secondary | ICD-10-CM | POA: Diagnosis not present

## 2020-04-21 DIAGNOSIS — Z8249 Family history of ischemic heart disease and other diseases of the circulatory system: Secondary | ICD-10-CM | POA: Diagnosis not present

## 2020-04-21 DIAGNOSIS — I4821 Permanent atrial fibrillation: Secondary | ICD-10-CM | POA: Diagnosis not present

## 2020-04-21 DIAGNOSIS — I2510 Atherosclerotic heart disease of native coronary artery without angina pectoris: Secondary | ICD-10-CM | POA: Diagnosis not present

## 2020-04-21 DIAGNOSIS — Z951 Presence of aortocoronary bypass graft: Secondary | ICD-10-CM | POA: Insufficient documentation

## 2020-04-21 NOTE — Progress Notes (Signed)
Primary Care Physician: Josetta Huddle, MD Primary Cardiologist: Dr Radford Pax Primary Electrophysiologist: Dr Curt Bears Referring Physician: Dr Blossom Hoops is a 80 y.o. male with a history of coronary artery disease status post CABG, hypertension, hyperlipidemia, paroxysmal atrial fibrillation, carotid stenosis status post left CEA, AAA status post repair who presents for follow up in the [facility]. Patient was started on amiodarone in 2017 when he developed postoperative afib. Patient's amiodarone was stopped 11/07/18 due to bradycardia. On 12/15/18 patient reports that he started having symptoms of fatigue and weakness. Seen by Dr Radford Pax on 12/19/18 and found to be in rate controlled afib. He denies any specific triggers. He does admit to alcohol use. A sleep study is pending. He is on Eliquis for a CHADS2VASC score of 6. Patient presented for DCCV on 12/28/18 but was in [place]. Patient reports he went into afib on 09/14/19. He did not have symptoms but detected and irregular pulse. He checks his pulse daily. EMS was called and ECG confirmed rate controlled afib (personally reviewed). He was not taken to the ED.   On follow up today, patient reports that he has done well since his last visit. He denies any heart racing or palpitations. He denies any bleeding issues on anticoagulation.   Today, he denies symptoms of palpitations, chest pain, orthopnea, PND, lower extremity edema, dizziness, presyncope, syncope, snoring, daytime somnolence, bleeding, or neurologic sequela. The patient is tolerating medications without difficulties and is otherwise without complaint today.    Atrial Fibrillation Risk Factors:  he does not have symptoms or diagnosis of sleep apnea. Sleep study negative. he does not have a history of rheumatic fever. he does have a history of alcohol use. The patient does not have a history of early familial atrial fibrillation or other  arrhythmias.  he has a BMI of Body mass index is 23.48 kg/m.[name] Filed Weights   04/21/20 1017  Weight: 80.7 kg    Family History  Problem Relation Age of Onset  . Heart disease Mother        AAA  Before age 22  . Cancer Mother 24       Colon cancer  . Hyperlipidemia Mother   . Heart failure Father      Atrial Fibrillation Management history:  Previous antiarrhythmic drugs: amiodarone Previous cardioversions: none Previous ablations: none CHADS2VASC score: 6 Anticoagulation history: Eliquis   Past Medical History:  Diagnosis Date  . Arthritis    HANDS AND FEET  . Asymptomatic carotid artery stenosis BILATERAL   PER DOPPLER  03/2016  RICA  1 - 39%/  s/p  L CEA  1-39%- followed by Dr. Donnetta Hutching  . CAD (coronary artery disease)    s/p remote stenting, s/p CABG with LIMA to LAD, SVG to RCA and SVG to LCx 12/2015  . Cataract immature BILATERAL   . Diverticulosis   . 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 History of diverticulosis    Noted on Colonoscopy  . History of inguinal herniorrhaphy   . Hydrocele, left   . Hyperlipidemia   . Hypertension   . Increased intraocular pressure   . Lumbar degenerative disc disease 03/2017  . Nocturia   . Paroxysmal atrial fibrillation ([place]) 06/30/14   chad2vasc score is at least 4  . 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 PVD (peripheral vascular disease) ([place])   . 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 Retinal detachment   .  SBO (small bowel obstruction) ([place])   . Stroke [facility])    mini stroke/2017   Past Surgical History:  Procedure Laterality Date  . AAA REPAIR W/ AORTOBIFEMORAL BYPASS  OCT  03546  . ABDOMINAL HERNIA REPAIR  X4  (LAST ONE 1990'S)   inguinal herniorrhaphy  . CARDIAC CATHETERIZATION N/A 12/16/2015   Procedure: Left Heart Cath and Coronary Angiography;  Surgeon: Wellington Hampshire, MD;  Location: [facility];  Service: Cardiovascular;  Laterality: N/A;  . CAROTID  ENDARTERECTOMY    . CARPAL TUNNEL RELEASE Right Oct. 2013   Hand  . CORONARY ANGIOPLASTY  2002   PTCA  W/ X2 BM STENTS--   PROXIMA LAD  &  LEFT CIRCUMFLEX TO FIRST OBTUSE MARGINAL BRANCH  . CORONARY ARTERY BYPASS GRAFT N/A 12/18/2015   Procedure: CORONARY ARTERY BYPASS GRAFTING (CABG) x 3, using left internal mammary artery and bilateral   leg greater saphenous vein harvested endoscopically  LIMA to LAD, SVG to Circumflex, SVG to RCA;  Surgeon: Grace Isaac, MD;  Location: [place];  Service: Open Heart Surgery;  Laterality: N/A;  . CORONARY ARTERY BYPASS GRAFT     3 vessel  . ENDARTERECTOMY Left 03/11/2015   Procedure: ENDARTERECTOMY LEFT CAROTID;  Surgeon: Rosetta Posner, MD;  Location: [place];  Service: Vascular;  Laterality: Left;  . EXPLORATORY LAPAROTOMY W/ ENTEROLYSIS FOR PARTIAL SBO  09-23-2008  . heart stents  02/19/00  . HYDROCELE EXCISION  01/24/2011   Procedure: HYDROCELECTOMY ADULT;  Surgeon: Bernestine Amass, MD;  Location[facility];  Service: Urology;  Laterality: Left;  . Ingrown Toe Nail Right Jan. 2015   Right Great Toe nail  . intest blockage  09/22/08  . PATCH ANGIOPLASTY Left 03/11/2015   Procedure: PATCH ANGIOPLASTY LEFT CAROTID USING HEMASHIELD PLATINUM FINESSE PATCH;  Surgeon: Rosetta Posner, MD;  Location: [place];  Service: Vascular;  Laterality: Left;  . PULLEY RELEASE -- RIGHT 5TH FINGER  2009  . RETINAL DETACHMENT SURGERY  2004   BILATERAL  . TEE WITHOUT CARDIOVERSION N/A 12/18/2015   Procedure: TRANSESOPHAGEAL ECHOCARDIOGRAM (TEE);  Surgeon: Grace Isaac, MD;  Location: [place];  Service: Open Heart Surgery;  Laterality: N/A;  . TOOTH EXTRACTION  Dec 2013    Current Outpatient Medications  Medication Sig Dispense Refill  . acetaminophen (TYLENOL) 500 MG tablet Take 2 tablets (1,000 mg total) by mouth every 6 (six) hours as needed. 30 tablet 0  . amLODipine (NORVASC) 5 MG tablet Take 5 mg by mouth daily.    [name] apixaban (ELIQUIS) 5 MG TABS tablet Take  1 tablet (5 mg total) by mouth 2 (two) times daily. 60 tablet 6  . Apoaequorin (PREVAGEN) 10 MG CAPS Take 10 mg by mouth daily.    [name] b complex vitamins tablet Take 1 tablet by mouth daily.    . calcium carbonate (OS-CAL) 600 MG TABS Take 600 mg by mouth daily.    . Cholecalciferol 1000 UNITS capsule Take 2,000 Units by mouth daily.     . cloNIDine (CATAPRES) 0.1 MG tablet Take 0.1 mg by mouth at bedtime as needed.   5  . Coenzyme Q10 (CO Q 10) 100 MG CAPS Take 200 mg by mouth daily.     . cyanocobalamin 1000 MCG tablet Take 1,000 mcg by mouth daily.    [name] EPINEPHrine 0.3 mg/0.3 mL IJ SOAJ injection Inject 0.3 mg into the muscle daily as needed for anaphylaxis (allergic reaction).     [name]  finasteride (PROSCAR) 5 MG tablet Take 5 mg by mouth daily.    . hydrALAZINE (APRESOLINE) 25 MG tablet Take 25 mg by mouth 3 (three) times daily as needed.     [name] losartan (COZAAR) 50 MG tablet Take 1 tablet (50 mg total) by mouth daily. (Patient taking differently: Take 50 mg by mouth in the morning and at bedtime.) 90 tablet 3  . Magnesium Hydroxide (MAGNESIA PO) Take 1,000 mg by mouth daily.    . Multiple Vitamin (MULTIVITAMIN) tablet Take 1 tablet by mouth daily.    . Omega-3 Fatty Acids (FISH OIL PO) Take 600 mg by mouth 2 (two) times daily.     . potassium gluconate 595 (99 K) MG TABS tablet Take 595 mg by mouth.    . rosuvastatin (CRESTOR) 40 MG tablet Take 1 tablet (40 mg total) by mouth every evening. 90 tablet 3  . Tamsulosin HCl (FLOMAX) 0.4 MG CAPS Take 0.8 mg by mouth at bedtime.    . vitamin C (ASCORBIC ACID) 500 MG tablet Take 2,000 mg by mouth daily.     No current facility-administered medications for this encounter.    Allergies  Allergen Reactions  . Wasp Venom Shortness Of Breath and Swelling  . Ramipril Swelling    Social History   Socioeconomic History  . Marital status: Married    Spouse name: Not on file  . Number of children: 2  . Years of education: some college  . Highest  education level: Not on file  Occupational History  . Not on file  Tobacco Use  . Smoking status: Former Smoker    Years: 30.00    Types: Cigarettes    Quit date: 01/18/1978    Years since quitting: 42.2  . Smokeless tobacco: Never Used  Substance and Sexual Activity  . Alcohol use: Not Currently    Alcohol/week: 2.0 standard drinks    Types: 2 Shots of liquor per week    Comment: 2 oz of vodka per day  . Drug use: No  . Sexual activity: Not on file  Other Topics Concern  . Not on file  Social History Narrative   Pt lives in [place] with spouse.     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   Right Handed   No caffeine   Social Determinants of Health   Financial Resource Strain: Not on file  Food Insecurity: Not on file  Transportation Needs: Not on file  Physical Activity: Not on file  Stress: Not on file  Social Connections: Not on file  Intimate Partner Violence: Not on file     ROS- All systems are reviewed and negative except as per the HPI above.  Physical Exam: Vitals:   04/21/20 1017  BP: 134/74  Pulse: (!) 57  Weight: 80.7 kg  Height: 6\' 1"  (1.854 m)    GEN- The patient is a well appearing elderly male, alert and oriented x 3 today.   HEENT-head normocephalic, atraumatic, sclera clear, conjunctiva pink, hearing intact, trachea midline. Lungs- Clear to ausculation bilaterally, normal work of breathing Heart- irregular rate and rhythm, no murmurs, rubs or gallops  GI- soft, NT, ND, + BS Extremities- no clubbing, cyanosis, or edema MS- no significant deformity or atrophy Skin- no rash or lesion Psych- euthymic mood, full affect Neuro- strength and sensation are intact   Wt Readings from Last 3 Encounters:  04/21/20 80.7 kg  01/15/20 84.6 kg  10/28/19 86.2 kg    EKG today demonstrates  Afib, LAFB Vent. rate 57 BPM PR interval * ms QRS duration 114 ms QT/QTcB 420/408 ms  Echo 01/23/19 demonstrated  Left ventricular ejection  fraction, by visual estimation, is 60 to 65%. The left ventricle has normal function. There is mildly increased left ventricular hypertrophy. 2. Left ventricular diastolic parameters are indeterminate. 3. Right ventricular volume/pressure overload. 4. The left ventricle has no regional wall motion abnormalities. 5. Global right ventricle has mildly reduced systolic function.The right ventricular size is mildly enlarged. No increase in right ventricular wall thickness. 6. Left atrial size was moderately dilated. 7. Right atrial size was moderately dilated. 8. The mitral valve is normal in structure. Mild mitral valve regurgitation. 9. The tricuspid valve is normal in structure. 10. The aortic valve is tricuspid. Aortic valve regurgitation is trivial. 11. The pulmonic valve was grossly normal. Pulmonic valve regurgitation is trivial. 12. Mildly elevated pulmonary artery systolic pressure. 13. The atrial septum is grossly normal. 14. The average left ventricular global longitudinal strain is -19.8 %.  Epic records are reviewed at length today  Assessment and Plan:  1. Permanent atrial fibrillation/atrial tach Patient had questions today regarding his permanent afib. We discussed that given his age, good rate control, and paucity of symptoms he is a good candidate for rate control at this time. He is in agreement with this plan. He has a Kardia device to monitor his heart rates.  Continue Eliquis 5 mg BID  This patients CHA2DS2-VASc Score and unadjusted Ischemic Stroke Rate (% per year) is equal to 9.7 % stroke rate/year from a score of 6  Above score calculated as 1 point each if present [CHF, HTN, DM, Vascular=MI/PAD/Aortic Plaque, Age if 65-74, or Male] Above score calculated as 2 points each if present [Age > 75, or Stroke/TIA/TE]  2. CAD s/p CABG with LIMA to LAD, SVG to RCA and SVG to LCx 12/2015. No anginal symptoms.  3. HTN Stable, no changes today.   Follow up with Dr  Radford Pax per recall. AF clinic as needed.    [facility] [address] [place], [place] [address] [phone] 04/21/2020 10:51 AM

## 2020-04-22 DIAGNOSIS — I4891 Unspecified atrial fibrillation: Secondary | ICD-10-CM | POA: Diagnosis not present

## 2020-04-22 DIAGNOSIS — E7800 Pure hypercholesterolemia, unspecified: Secondary | ICD-10-CM | POA: Diagnosis not present

## 2020-04-22 DIAGNOSIS — R1032 Left lower quadrant pain: Secondary | ICD-10-CM | POA: Diagnosis not present

## 2020-04-22 DIAGNOSIS — R1011 Right upper quadrant pain: Secondary | ICD-10-CM | POA: Diagnosis not present

## 2020-04-22 DIAGNOSIS — M25551 Pain in right hip: Secondary | ICD-10-CM | POA: Diagnosis not present

## 2020-04-22 DIAGNOSIS — I10 Essential (primary) hypertension: Secondary | ICD-10-CM | POA: Diagnosis not present

## 2020-04-29 DIAGNOSIS — M25552 Pain in left hip: Secondary | ICD-10-CM | POA: Diagnosis not present

## 2020-05-29 DIAGNOSIS — M160 Bilateral primary osteoarthritis of hip: Secondary | ICD-10-CM | POA: Diagnosis not present

## 2020-05-29 DIAGNOSIS — M1612 Unilateral primary osteoarthritis, left hip: Secondary | ICD-10-CM | POA: Diagnosis not present

## 2020-05-29 DIAGNOSIS — M1611 Unilateral primary osteoarthritis, right hip: Secondary | ICD-10-CM | POA: Diagnosis not present

## 2020-06-03 ENCOUNTER — Telehealth: Payer: Self-pay | Admitting: *Deleted

## 2020-06-03 DIAGNOSIS — Z79899 Other long term (current) drug therapy: Secondary | ICD-10-CM

## 2020-06-03 NOTE — Telephone Encounter (Signed)
   [facility] Pre-operative Risk Assessment    Patient Name: Marcus Marcus  DOB: 21-Mar-1940  MRN: [id number]   HEARTCARE STAFF: - Please ensure there is not already an duplicate clearance open for this procedure. - Under Visit Info/Reason for Call, type in Other and utilize the format Clearance MM/DD/YY or Clearance TBD. Do not use dashes or single digits. - If request is for dental extraction, please clarify the # of teeth to be extracted.  Request for surgical clearance:  1. What type of surgery is being performed? RIGHT TOTAL HIP ARTHROPLASTY-ANTERIOR   2. When is this surgery scheduled? 09/02/20   3. What type of clearance is required (medical clearance vs. Pharmacy clearance to hold med vs. Both)? BOTH  4. Are there any medications that need to be held prior to surgery and how long? ELIQUIS   5. Practice name and name of physician performing surgery? [facility]; DR. FRANK Marcus   6. What is the office phone number? [phone]   7.   What is the office fax number? [phone] ATN: [name]  8.   Anesthesia type (None, local, MAC, general) ? CHOICE   [name] 06/03/2020, 5:25 PM  _________________________________________________________________   (provider comments below)

## 2020-06-04 NOTE — Telephone Encounter (Signed)
Spoke with pt who is agreeable to have blood work drawn tomorrow 5-20 at [address] office. Pt thanked me for the call. I have place an order for a CBC.

## 2020-06-04 NOTE — Telephone Encounter (Signed)
Patient has no record of CBC since Dec 2020.  Will need this lab drawn for determination of Eliquis hold

## 2020-06-04 NOTE — Telephone Encounter (Signed)
[name] has not had a CBC since 12/20.  An order for pharmacy to make recommendations he will need to have CBC drawn.  Please contact patient and inform him that he will need to have blood work drawn so that we may make recommendations.

## 2020-06-05 ENCOUNTER — Other Ambulatory Visit: Payer: Self-pay

## 2020-06-05 ENCOUNTER — Other Ambulatory Visit: Payer: PPO | Admitting: *Deleted

## 2020-06-05 DIAGNOSIS — Z79899 Other long term (current) drug therapy: Secondary | ICD-10-CM | POA: Diagnosis not present

## 2020-06-05 LAB — CBC
Hematocrit: 32.8 % — ABNORMAL LOW (ref 37.5–51.0)
Hemoglobin: 11.4 g/dL — ABNORMAL LOW (ref 13.0–17.7)
MCH: 32.2 pg (ref 26.6–33.0)
MCHC: 34.8 g/dL (ref 31.5–35.7)
MCV: 93 fL (ref 79–97)
Platelets: 149 10*3/uL — ABNORMAL LOW (ref 150–450)
RBC: 3.54 x10E6/uL — ABNORMAL LOW (ref 4.14–5.80)
RDW: 13 % (ref 11.6–15.4)
WBC: 5.1 10*3/uL (ref 3.4–10.8)

## 2020-06-05 NOTE — Telephone Encounter (Signed)
Pt came in today and had lab work done for pre op clearance. Will await lab results and further recommendations from pre op provider.

## 2020-06-08 ENCOUNTER — Encounter: Payer: Self-pay | Admitting: Pharmacist

## 2020-06-08 NOTE — Telephone Encounter (Signed)
Patient with diagnosis of afib on Eliquis for anticoagulation.    Procedure: Right total hip arthroplasty - anterior Date of procedure: 09/02/20  CHA2DS2-VASc Score = 6  This indicates a 9.7% annual risk of stroke. The patient's score is based upon: CHF History: No HTN History: Yes Diabetes History: No Stroke History: Yes Vascular Disease History: Yes Age Score: 2 Gender Score: 0  CrCl 49 mL/min Platelet count 149K  Would recommend 3 day hold for Eliquis with Lovenox bridging given history of stroke. Will send to primary cardiologist for final decision.  For orthopedic procedures, be sure to resume at therapeutic (not prophylactic) dosing.

## 2020-06-08 NOTE — Telephone Encounter (Signed)
Pt's procedure is not until 09/02/20. I am going to remove from the pre op call back pool at this time. See notes. Will await final clearance notes from pre op provider.

## 2020-06-08 NOTE — Telephone Encounter (Signed)
clearanace

## 2020-06-08 NOTE — Telephone Encounter (Signed)
Will route to PharmD for rec's re: holding anticoagulation. Lab Results  Component Value Date   WBC 5.1 06/05/2020   HGB 11.4 (L) 06/05/2020   HCT 32.8 (L) 06/05/2020   MCV 93 06/05/2020   PLT 149 (L) 06/05/2020    Richardson Dopp, PA-C    06/08/2020 9:26 AM

## 2020-06-09 DIAGNOSIS — N183 Chronic kidney disease, stage 3 unspecified: Secondary | ICD-10-CM | POA: Diagnosis not present

## 2020-06-09 DIAGNOSIS — I2510 Atherosclerotic heart disease of native coronary artery without angina pectoris: Secondary | ICD-10-CM | POA: Diagnosis not present

## 2020-06-09 DIAGNOSIS — M1990 Unspecified osteoarthritis, unspecified site: Secondary | ICD-10-CM | POA: Diagnosis not present

## 2020-06-09 DIAGNOSIS — M159 Polyosteoarthritis, unspecified: Secondary | ICD-10-CM | POA: Diagnosis not present

## 2020-06-09 DIAGNOSIS — N400 Enlarged prostate without lower urinary tract symptoms: Secondary | ICD-10-CM | POA: Diagnosis not present

## 2020-06-09 DIAGNOSIS — I63512 Cerebral infarction due to unspecified occlusion or stenosis of left middle cerebral artery: Secondary | ICD-10-CM | POA: Diagnosis not present

## 2020-06-09 DIAGNOSIS — I10 Essential (primary) hypertension: Secondary | ICD-10-CM | POA: Diagnosis not present

## 2020-06-09 DIAGNOSIS — I4891 Unspecified atrial fibrillation: Secondary | ICD-10-CM | POA: Diagnosis not present

## 2020-06-09 DIAGNOSIS — E7800 Pure hypercholesterolemia, unspecified: Secondary | ICD-10-CM | POA: Diagnosis not present

## 2020-06-09 DIAGNOSIS — E785 Hyperlipidemia, unspecified: Secondary | ICD-10-CM | POA: Diagnosis not present

## 2020-06-09 DIAGNOSIS — I119 Hypertensive heart disease without heart failure: Secondary | ICD-10-CM | POA: Diagnosis not present

## 2020-06-12 DIAGNOSIS — M25551 Pain in right hip: Secondary | ICD-10-CM | POA: Diagnosis not present

## 2020-07-06 NOTE — Telephone Encounter (Signed)
Called patient to complete pre-op evaluation. He is currently playing golf. Will call back tomorrow when he is able to talk more; however, suspect we will be able to clear patient for surgery given he is able to play golf.  Per Revised Cardiac Risk Index, consider moderate risk with 6.6% change of adverse cardiac events given history of CAD and prior stroke.  Darreld Mclean, PA-C 07/06/2020 12:20 PM

## 2020-07-07 DIAGNOSIS — I4891 Unspecified atrial fibrillation: Secondary | ICD-10-CM | POA: Diagnosis not present

## 2020-07-07 DIAGNOSIS — N183 Chronic kidney disease, stage 3 unspecified: Secondary | ICD-10-CM | POA: Diagnosis not present

## 2020-07-07 DIAGNOSIS — Z0000 Encounter for general adult medical examination without abnormal findings: Secondary | ICD-10-CM | POA: Diagnosis not present

## 2020-07-07 DIAGNOSIS — M25551 Pain in right hip: Secondary | ICD-10-CM | POA: Diagnosis not present

## 2020-07-07 DIAGNOSIS — Z1389 Encounter for screening for other disorder: Secondary | ICD-10-CM | POA: Diagnosis not present

## 2020-07-07 DIAGNOSIS — E7800 Pure hypercholesterolemia, unspecified: Secondary | ICD-10-CM | POA: Diagnosis not present

## 2020-07-07 DIAGNOSIS — M1990 Unspecified osteoarthritis, unspecified site: Secondary | ICD-10-CM | POA: Diagnosis not present

## 2020-07-07 DIAGNOSIS — D6869 Other thrombophilia: Secondary | ICD-10-CM | POA: Diagnosis not present

## 2020-07-07 DIAGNOSIS — E559 Vitamin D deficiency, unspecified: Secondary | ICD-10-CM | POA: Diagnosis not present

## 2020-07-07 DIAGNOSIS — I714 Abdominal aortic aneurysm, without rupture: Secondary | ICD-10-CM | POA: Diagnosis not present

## 2020-07-07 DIAGNOSIS — R202 Paresthesia of skin: Secondary | ICD-10-CM | POA: Diagnosis not present

## 2020-07-07 DIAGNOSIS — N400 Enlarged prostate without lower urinary tract symptoms: Secondary | ICD-10-CM | POA: Diagnosis not present

## 2020-07-07 DIAGNOSIS — M270 Developmental disorders of jaws: Secondary | ICD-10-CM | POA: Diagnosis not present

## 2020-07-07 DIAGNOSIS — I10 Essential (primary) hypertension: Secondary | ICD-10-CM | POA: Diagnosis not present

## 2020-07-07 DIAGNOSIS — G629 Polyneuropathy, unspecified: Secondary | ICD-10-CM | POA: Diagnosis not present

## 2020-07-07 NOTE — Telephone Encounter (Signed)
Spoke with patient and scheduled appointment for 8/5 for Lovenox teaching

## 2020-07-07 NOTE — Telephone Encounter (Signed)
    [name] DOB:  01-12-1941  MRN:  [id number]   Primary Cardiologist: Fransico Him, MD  Chart reviewed as part of pre-operative protocol coverage. Patient was last seen in the [facility] in 04/2020 at which time he was doing well. Patient was contacted today for further pre-op evaluation and reported doing well since last visit. No chest pain, shortness of breath, orthopnea/PND. He atrial fibrillation is well controlled and he denies any significant palpitations. No lightheadedness, dizziness, or syncope. He is able to complete >4.0 METS (he played golf yesterday and had no problems with this). Per Revised Cardiac Risk Index, consider moderate risk with 6.6% change of adverse cardiac events given history of CAD and prior stroke. Given past medical history and time since last visit, based on ACC/AHA guidelines, [place]APE would be at acceptable risk for the planned procedure without further cardiovascular testing.   Per Pharmacy and Dr. Radford Pax, would recommend 3 day hold for Eliquis with Lovenox bridging given history of stroke. Discussed this recommendation with patient he understands and agrees. Our Pharmacist will help arrange Lovenox bridge.  I will route this recommendation to the requesting party via Epic fax function and remove from pre-op pool.  Please call with questions.  Darreld Mclean, PA-C 07/07/2020, 8:49 AM

## 2020-07-07 NOTE — Telephone Encounter (Signed)
Pharmacy, you all will help arrange Lovenox bridge correct? Is there anything else I need to do from my end?  Thank you!

## 2020-07-07 NOTE — Telephone Encounter (Signed)
Yes we will arrange.  Nothing else needed from your end

## 2020-07-13 DIAGNOSIS — I63512 Cerebral infarction due to unspecified occlusion or stenosis of left middle cerebral artery: Secondary | ICD-10-CM | POA: Diagnosis not present

## 2020-07-13 DIAGNOSIS — E7800 Pure hypercholesterolemia, unspecified: Secondary | ICD-10-CM | POA: Diagnosis not present

## 2020-07-13 DIAGNOSIS — N400 Enlarged prostate without lower urinary tract symptoms: Secondary | ICD-10-CM | POA: Diagnosis not present

## 2020-07-13 DIAGNOSIS — I701 Atherosclerosis of renal artery: Secondary | ICD-10-CM | POA: Diagnosis not present

## 2020-07-13 DIAGNOSIS — E785 Hyperlipidemia, unspecified: Secondary | ICD-10-CM | POA: Diagnosis not present

## 2020-07-13 DIAGNOSIS — I4891 Unspecified atrial fibrillation: Secondary | ICD-10-CM | POA: Diagnosis not present

## 2020-07-13 DIAGNOSIS — I119 Hypertensive heart disease without heart failure: Secondary | ICD-10-CM | POA: Diagnosis not present

## 2020-07-13 DIAGNOSIS — I2510 Atherosclerotic heart disease of native coronary artery without angina pectoris: Secondary | ICD-10-CM | POA: Diagnosis not present

## 2020-07-13 DIAGNOSIS — I10 Essential (primary) hypertension: Secondary | ICD-10-CM | POA: Diagnosis not present

## 2020-07-13 DIAGNOSIS — N183 Chronic kidney disease, stage 3 unspecified: Secondary | ICD-10-CM | POA: Diagnosis not present

## 2020-07-13 DIAGNOSIS — M159 Polyosteoarthritis, unspecified: Secondary | ICD-10-CM | POA: Diagnosis not present

## 2020-07-28 DIAGNOSIS — R609 Edema, unspecified: Secondary | ICD-10-CM | POA: Diagnosis not present

## 2020-07-28 DIAGNOSIS — I10 Essential (primary) hypertension: Secondary | ICD-10-CM | POA: Diagnosis not present

## 2020-08-03 ENCOUNTER — Other Ambulatory Visit: Payer: Self-pay

## 2020-08-03 DIAGNOSIS — I779 Disorder of arteries and arterioles, unspecified: Secondary | ICD-10-CM

## 2020-08-03 DIAGNOSIS — I6521 Occlusion and stenosis of right carotid artery: Secondary | ICD-10-CM

## 2020-08-04 NOTE — H&P (Addendum)
TOTAL HIP ADMISSION H&P  Patient is admitted for right total hip arthroplasty.  Subjective:  Chief Complaint: Right hip pain  HPI: [name], 80 y.o. male, has a history of pain and functional disability in the right hip due to arthritis and patient has failed non-surgical conservative treatments for greater than 12 weeks to include corticosteriod injections and activity modification. Onset of symptoms was gradual, starting  several  years ago with rapidlly worsening course since that time. The patient noted no past surgery on the right hip. Patient currently rates pain in the right hip at 9 out of 10 with activity. Patient has night pain, worsening of pain with activity and weight bearing, pain that interfers with activities of daily living, and pain with passive range of motion. Patient has evidence of  bone-on-bone arthritis in the bilateral hips with significant osteophyte formation  by imaging studies. This condition presents safety issues increasing the risk of falls. There is no current active infection.  Patient Active Problem List   Diagnosis Date Noted   Permanent atrial fibrillation ([place]) 10/18/2019   Secondary hypercoagulable state ([place]) 12/21/2018   Pain in joint of right hip 11/23/2018   Osteoarthritis of left hip 10/19/2018   Degeneration of lumbar intervertebral disc 05/25/2017   Carotid stenosis 02/25/2015   Essential hypertension 02/25/2015   Stroke ([place]) 02/22/2015   CVA (cerebral infarction) 02/22/2015   Cerebrovascular accident (CVA) due to occlusion of cerebral artery (HCC)    Renal insufficiency    Persistent atrial fibrillation ([place]) 09/12/2014   Migraine aura without headache 09/10/2013   Peripheral vascular disease, unspecified ([place]) 10/05/2012   Abdominal aneurysm without mention of rupture 10/05/2012   Thoracic aneurysm, ruptured ([place]) 10/05/2012   Pure hypercholesterolemia 10/05/2012   Coronary atherosclerosis of native coronary artery 10/05/2012    Other and unspecified hyperlipidemia 10/05/2012   Atherosclerosis of renal artery ([place]) 10/05/2012   Other specified type of hydrocele 10/05/2012   Family history of malignant neoplasm of gastrointestinal tract 10/05/2012   Bronchitis, not specified as acute or chronic 10/05/2012   Nocturia    Arthritis    History of AAA (abdominal aortic aneurysm) repair    Status post primary angioplasty with coronary stent    Popliteal artery aneurysm (HCC)    Retinal detachment    Increased intraocular pressure    History of inguinal herniorrhaphy    History of diverticulosis    Hydrocele 01/24/2011   Hydrocele, left 01/17/2011    Past Medical History:  Diagnosis Date   Arthritis    HANDS AND FEET   Asymptomatic carotid artery stenosis BILATERAL   PER DOPPLER  03/2016  RICA  1 - 39%/  s/p  L CEA  1-39%- followed by Dr. 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   Nocturia    Paroxysmal atrial fibrillation ([place]) 06/30/14   chad2vasc score is at least 4   Popliteal artery aneurysm (HCC) LEFT   PVD (peripheral vascular disease) ([place])    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 2013   Hand   CORONARY ANGIOPLASTY  2002   PTCA  W/ X2 BM STENTS--   PROXIMA LAD  &  LEFT CIRCUMFLEX TO FIRST OBTUSE MARGINAL BRANCH   CORONARY ARTERY BYPASS GRAFT N/A 12/18/2015   Procedure: CORONARY ARTERY BYPASS GRAFTING (CABG) x 3, using left internal mammary artery and bilateral   leg greater saphenous vein harvested endoscopically  LIMA to LAD, SVG to Circumflex, SVG to RCA;  Surgeon: Grace Isaac, MD;  Location: [facility];  Service: Open Heart Surgery;  Laterality: N/A;   CORONARY ARTERY BYPASS GRAFT     3 vessel   ENDARTERECTOMY Left 03/11/2015   Procedure: ENDARTERECTOMY LEFT CAROTID;  Surgeon: Rosetta Posner, MD;  Location: [facility];  Service: Vascular;  Laterality: Left;   EXPLORATORY LAPAROTOMY W/ ENTEROLYSIS FOR PARTIAL SBO  09-23-2008   heart stents  02/19/00   HYDROCELE EXCISION  01/24/2011   Procedure: HYDROCELECTOMY ADULT;  Surgeon: Bernestine Amass, MD;  Location: [facility];  Service: Urology;  Laterality: Left;   Ingrown Toe Nail Right Jan. 2015   Right Great Toe nail   intest blockage  09/22/08   PATCH ANGIOPLASTY Left 03/11/2015   Procedure: PATCH ANGIOPLASTY LEFT CAROTID USING HEMASHIELD PLATINUM FINESSE PATCH;  Surgeon: Rosetta Posner, MD;  Location: [facility];  Service: Vascular;  Laterality: Left;   PULLEY RELEASE -- RIGHT 5TH FINGER  2009   RETINAL DETACHMENT SURGERY  2004   BILATERAL   TEE WITHOUT CARDIOVERSION N/A 12/18/2015   Procedure: TRANSESOPHAGEAL ECHOCARDIOGRAM (TEE);  Surgeon: Grace Isaac, MD;  Location: [facility];  Service: Open Heart Surgery;  Laterality: N/A;   TOOTH EXTRACTION  Dec 2013    Prior to Admission medications   Medication Sig Start Date End Date Taking? Authorizing Provider  acetaminophen (TYLENOL) 500 MG tablet Take 2 tablets (1,000 mg total) by mouth every 6 (six) hours as needed. 12/22/15    Barrett, Erin R, PA-C  amLODipine (NORVASC) 5 MG tablet Take 5 mg by mouth daily. 07/31/19   [provider]  apixaban (ELIQUIS) 5 MG TABS tablet Take 1 tablet (5 mg total) by mouth 2 (two) times daily. 06/30/14   Sueanne Margarita, MD  Apoaequorin (PREVAGEN) 10 MG CAPS Take 10 mg by mouth daily.    [provider]  b complex vitamins tablet Take 1 tablet by mouth daily.    [provider]  calcium carbonate (OS-CAL) 600 MG TABS Take 600 mg by mouth daily.    [provider]  Cholecalciferol 1000 UNITS capsule Take 2,000 Units by mouth daily.     [provider]  cloNIDine (CATAPRES) 0.1 MG tablet Take 0.1 mg by mouth at bedtime as needed.  03/29/17   [provider]  Coenzyme Q10 (CO Q 10) 100 MG CAPS Take 200 mg by mouth daily.  [provider]  cyanocobalamin 1000 MCG tablet Take 1,000 mcg by mouth daily.    [provider]  EPINEPHrine 0.3 mg/0.3 mL IJ SOAJ injection Inject 0.3 mg into the muscle daily as needed for anaphylaxis (allergic reaction).     [provider]  finasteride (PROSCAR) 5 MG tablet Take 5 mg by mouth daily.    [provider]  hydrALAZINE (APRESOLINE) 25 MG tablet Take 25 mg by mouth 3 (three) times daily as needed.  01/30/19   [provider]  losartan (COZAAR) 50 MG tablet Take 1 tablet (50 mg total) by mouth daily. Patient taking differently: Take 50 mg by mouth in the morning and at bedtime. 11/21/17 10/18/19  Sueanne Margarita, MD  Magnesium Hydroxide (MAGNESIA PO) Take 1,000 mg by mouth daily.    [provider]  Multiple Vitamin (MULTIVITAMIN) tablet Take 1 tablet by mouth daily.    [provider]  Omega-3 Fatty Acids (FISH OIL PO) Take 600 mg by mouth 2 (two) times daily.     [provider]  potassium gluconate 595 (99 K) MG TABS tablet Take 595 mg by mouth.    [provider]  rosuvastatin (CRESTOR) 40 MG tablet Take 1 tablet (40 mg  total) by mouth every evening. 12/16/14   Sueanne Margarita, MD  Tamsulosin HCl (FLOMAX) 0.4 MG CAPS Take 0.8 mg by mouth at bedtime.    [provider]  vitamin C (ASCORBIC ACID) 500 MG tablet Take 2,000 mg by mouth daily.    [provider]    Allergies  Allergen Reactions   Wasp Venom Shortness Of Breath and Swelling   Ramipril Swelling    Social History   Socioeconomic History   Marital status: Married    Spouse name: Not on file   Number of children: 2   Years of education: some college   Highest education level: Not on file  Occupational History   Not on file  Tobacco Use   Smoking status: Former    Years: 30.00    Types: Cigarettes    Quit date: 01/18/1978    Years since quitting: 42.5   Smokeless tobacco: Never  Substance and Sexual Activity   Alcohol use: Not Currently    Alcohol/week: 2.0 standard drinks    Types: 2 Shots of liquor per week    Comment: 2 oz of vodka per day   Drug use: No   Sexual activity: Not on file  Other Topics Concern   Not on file  Social History Narrative   Pt lives in [place] with spouse.     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   Right Handed   No caffeine   Social Determinants of Radio broadcast assistant Strain: Not on file  Food Insecurity: Not on file  Transportation Needs: Not on file  Physical Activity: Not on file  Stress: Not on file  Social Connections: Not on file  Intimate Partner Violence: Not on file    Tobacco Use: Medium Risk   Smoking Tobacco Use: Former   Smokeless Tobacco Use: Never   Social History   Substance and Sexual Activity  Alcohol Use Not Currently   Alcohol/week: 2.0 standard drinks   Types: 2 Shots of liquor per week   Comment: 2 oz of vodka per day    Family History  Problem Relation Age of Onset   Heart disease Mother        AAA  Before  age 63   Cancer Mother 54       Colon cancer   Hyperlipidemia Mother    Heart failure Father      Review of Systems  Constitutional:  Negative for chills and fever.  HENT:  Negative for congestion, sore throat and tinnitus.   Eyes:  Negative for double vision, photophobia and pain.  Respiratory:  Negative for cough, shortness of breath and wheezing.   Cardiovascular:  Negative for chest pain, palpitations and orthopnea.  Gastrointestinal:  Negative for heartburn, nausea and vomiting.  Genitourinary:  Negative for dysuria, frequency and urgency.  Musculoskeletal:  Positive for joint pain.  Neurological:  Negative for dizziness, weakness and headaches.    Objective:  Physical Exam: Well nourished and well developed.  General: Alert and oriented x3, cooperative and pleasant, no acute distress.  Head: normocephalic, atraumatic, neck supple.  Eyes: EOMI.  Respiratory: breath sounds clear in all fields, no wheezing, rales, or rhonchi. Cardiovascular: Regular rate and rhythm, no murmurs, gallops or rubs.  Abdomen: non-tender to palpation and soft, normoactive bowel sounds. Musculoskeletal:  Right Hip Exam:  The range of motion: Flexion to 100 degrees, Internal Rotation is minimal, External Rotation to 10 to 20 degrees, and abduction to 20 degrees without discomfort. There is no tenderness over the greater trochanteric bursa.  Calves soft and nontender. Motor function intact in LE. Strength 5/5 LE bilaterally. Neuro: Distal pulses 2+. Sensation to light touch intact in LE.  Imaging Review Plain radiographs demonstrate severe degenerative joint disease of the right hip. The bone quality appears to be adequate for age and reported activity level.  Assessment/Plan:  End stage arthritis, right hip  The patient history, physical examination, clinical judgement of the provider and imaging studies are consistent with end stage degenerative joint disease of the right hip and total hip arthroplasty is deemed medically necessary. The treatment options including medical management,  injection therapy, arthroscopy and arthroplasty were discussed at length. The risks and benefits of total hip arthroplasty were presented and reviewed. The risks due to aseptic loosening, infection, stiffness, dislocation/subluxation, thromboembolic complications and other imponderables were discussed. The patient acknowledged the explanation, agreed to proceed with the plan and consent was signed. Patient is being admitted for inpatient treatment for surgery, pain control, PT, OT, prophylactic antibiotics, VTE prophylaxis, progressive ambulation and ADLs and discharge planning.The patient is planning to be discharged  home .   Patient's anticipated LOS is less than 2 midnights, meeting these requirements: - Younger than 57 - Lives within 1 hour of care - Has a competent adult at home to recover with post-op recover - NO history of  - Chronic pain requiring opiods  - Diabetes  - Coronary Artery Disease  - Heart failure  - Heart attack  - Stroke  - DVT/VTE  - Cardiac arrhythmia  - Respiratory Failure/COPD  - Renal failure  - Anemia  - Advanced Liver disease  Therapy Plans: HEP Disposition: Home with wife Planned DVT Prophylaxis: Eliquis 5 mg BID DME Needed: None PCP: Josetta Huddle, MD (clearance received) Cardiologist: Fransico Him, MD (clearance received) TXA: IV Allergies: Ramipril (swelling) Anesthesia Concerns: None BMI: 25.6 Last HgbA1c: Not diabetic [facility]  Other:  - Recently stopped amlodipine per Dr. Inda Merlin due to bilateral ankle swelling - Stopping eliquis 3 days prior to surgery per Dr. Radford Pax - Hx CAD, paroxysmal atrial fibrillation  - Patient was instructed on what medications to stop prior to surgery. - Follow-up visit in 2 weeks with Dr.  Aluisio - Begin physical therapy following surgery - Pre-operative lab work as pre-surgical testing - Prescriptions will be provided in hospital at time of discharge  Theresa Duty, PA-C Orthopedic  Surgery [facility]

## 2020-08-10 ENCOUNTER — Other Ambulatory Visit: Payer: Self-pay

## 2020-08-10 ENCOUNTER — Ambulatory Visit (HOSPITAL_COMMUNITY)
Admission: RE | Admit: 2020-08-10 | Discharge: 2020-08-10 | Disposition: A | Discharge status: Home / Self Care | Payer: PPO | Source: Ambulatory Visit | Attending: Surgery | Admitting: Surgery

## 2020-08-10 ENCOUNTER — Ambulatory Visit (INDEPENDENT_AMBULATORY_CARE_PROVIDER_SITE_OTHER)
Admission: RE | Admit: 2020-08-10 | Discharge: 2020-08-10 | Disposition: A | Discharge status: Home / Self Care | Payer: PPO | Source: Ambulatory Visit | Attending: Vascular Surgery | Admitting: Vascular Surgery

## 2020-08-10 ENCOUNTER — Ambulatory Visit (INDEPENDENT_AMBULATORY_CARE_PROVIDER_SITE_OTHER)
Admission: RE | Admit: 2020-08-10 | Discharge: 2020-08-10 | Disposition: A | Discharge status: Home / Self Care | Payer: PPO | Source: Ambulatory Visit | Attending: Surgery | Admitting: Surgery

## 2020-08-10 DIAGNOSIS — I6521 Occlusion and stenosis of right carotid artery: Secondary | ICD-10-CM

## 2020-08-10 DIAGNOSIS — I779 Disorder of arteries and arterioles, unspecified: Secondary | ICD-10-CM | POA: Diagnosis not present

## 2020-08-12 ENCOUNTER — Ambulatory Visit: Payer: PPO | Admitting: Vascular Surgery

## 2020-08-12 ENCOUNTER — Other Ambulatory Visit: Payer: Self-pay

## 2020-08-12 ENCOUNTER — Encounter: Payer: Self-pay | Admitting: Vascular Surgery

## 2020-08-12 VITALS — BP 149/79 | HR 64 | Temp 97.2°F | Ht 73.0 in | Wt 180.0 lb

## 2020-08-12 DIAGNOSIS — I779 Disorder of arteries and arterioles, unspecified: Secondary | ICD-10-CM | POA: Diagnosis not present

## 2020-08-12 DIAGNOSIS — I739 Peripheral vascular disease, unspecified: Secondary | ICD-10-CM | POA: Diagnosis not present

## 2020-08-12 NOTE — Progress Notes (Signed)
[facility] [place]  Patient name: [name] MRN: [id number] DOB: 05/06/1940 Sex: male  REASON FOR VISIT: Follow-up diffuse peripheral vascular occlusive disease  HPI: [name] is a 80 y.o. male here today for follow-up of diffuse peripheral vascular occlusive disease.  He is in his usual very vigorous self.  Main complaints today are of swelling and discomfort in his feet bilaterally.  He continues to have difficulty with claudication but has tolerated this for many years.  He is having swelling in his feet and reports that he has pain in his feet that awaken him at night.  He does have some relief and walking and then returning back to bed.  He has no tissue loss in his lower extremities.  He does have known distal tibial disease from prior imaging studies with single-vessel peroneal runoff bilaterally.  He does have a history of aortobifemoral bypass by myself in 2005 you both to aortic aneurysm and aortoiliac occlusive disease.  He underwent left carotid endarterectomy for severe asymptomatic disease in 2017 and also underwent coronary bypass grafting in 2017.  He has no neurologic symptoms and no new coronary difficulty  Past Medical History:  Diagnosis Date   Arthritis    HANDS AND FEET   Asymptomatic carotid artery stenosis BILATERAL   PER DOPPLER  03/2016  RICA  1 - 39%/  s/p  L CEA  1-39%- followed by Dr. 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   Nocturia    Paroxysmal  atrial fibrillation (HCC) 06/30/14   chad2vasc score is at least 4   Popliteal artery aneurysm (HCC) LEFT   PVD (peripheral vascular disease) (HCC)    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   Retinal detachment    SBO (small bowel obstruction) (HCC)    Stroke ([place])    mini stroke/2017    Family History  Problem Relation Age of Onset   Heart disease Mother        AAA  Before age 77   Cancer Mother 43       Colon cancer   Hyperlipidemia Mother    Heart failure Father     SOCIAL HISTORY: Social History   Tobacco Use   Smoking status: Former    Years: 30.00    Types: Cigarettes    Quit date: 01/18/1978    Years since quitting: 42.5   Smokeless tobacco: Never  Substance Use Topics   Alcohol use: Not Currently    Alcohol/week: 2.0 standard drinks    Types: 2 Shots of liquor per week    Comment: 2 oz of vodka per day    Allergies  Allergen Reactions   Wasp Venom Shortness Of Breath and Swelling   Ramipril Swelling    Current Outpatient Medications  Medication Sig Dispense Refill   acetaminophen (TYLENOL) 500 MG tablet Take 2 tablets (1,000 mg total) by mouth every 6 (six) hours as needed. 30 tablet 0   apixaban (ELIQUIS) 5 MG  TABS tablet Take 1 tablet (5 mg total) by mouth 2 (two) times daily. 60 tablet 6   Apoaequorin (PREVAGEN) 10 MG CAPS Take 10 mg by mouth daily.     b complex vitamins tablet Take 1 tablet by mouth daily.     calcium carbonate (OS-CAL) 600 MG TABS Take 600 mg by mouth daily.     Cholecalciferol 1000 UNITS capsule Take 2,000 Units by mouth daily.      Coenzyme Q10 (CO Q 10) 100 MG CAPS Take 200 mg by mouth daily.      cyanocobalamin 1000 MCG tablet Take 1,000 mcg by mouth daily.     EPINEPHrine 0.3 mg/0.3 mL IJ SOAJ injection Inject 0.3 mg into the muscle daily as needed for anaphylaxis (allergic reaction).      finasteride (PROSCAR) 5 MG tablet Take 5 mg by mouth daily.     hydrALAZINE (APRESOLINE) 25 MG tablet Take 25 mg by mouth 3 (three) times daily as  needed.      Magnesium Hydroxide (MAGNESIA PO) Take 1,000 mg by mouth daily.     Multiple Vitamin (MULTIVITAMIN) tablet Take 1 tablet by mouth daily.     Omega-3 Fatty Acids (FISH OIL PO) Take 600 mg by mouth 2 (two) times daily.      potassium gluconate 595 (99 K) MG TABS tablet Take 595 mg by mouth.     rosuvastatin (CRESTOR) 40 MG tablet Take 1 tablet (40 mg total) by mouth every evening. 90 tablet 3   Tamsulosin HCl (FLOMAX) 0.4 MG CAPS Take 0.8 mg by mouth at bedtime.     vitamin C (ASCORBIC ACID) 500 MG tablet Take 2,000 mg by mouth daily.     losartan (COZAAR) 50 MG tablet Take 1 tablet (50 mg total) by mouth daily. (Patient taking differently: Take 50 mg by mouth in the morning and at bedtime.) 90 tablet 3   No current facility-administered medications for this visit.    REVIEW OF SYSTEMS:  '[X]'$  denotes positive finding, '[ ]'$  denotes negative finding Cardiac  Comments:  Chest pain or chest pressure:    Shortness of breath upon exertion:    Short of breath when lying flat:    Irregular heart rhythm:        Vascular    Pain in calf, thigh, or hip brought on by ambulation: x   Pain in feet at night that wakes you up from your sleep:  x   Blood clot in your veins:    Leg swelling:  x         PHYSICAL EXAM: Vitals:   08/12/20 0858 08/12/20 0900  BP: 129/77 (!) 149/79  Pulse: 65 64  Temp: (!) 97.2 F (36.2 C)   TempSrc: Oral   SpO2: 99% 99%  Weight: 180 lb (81.6 kg)   Height: '6\' 1"'$  (1.854 m)     GENERAL: The patient is a well-nourished male, in no acute distress. The vital signs are documented above. CARDIOVASCULAR: Carotid arteries without bruits bilaterally.  2+ radial and 2+ popliteal pulses bilaterally.  I do not palpate pedal pulses PULMONARY: There is good air exchange  MUSCULOSKELETAL: There are no major deformities or cyanosis. NEUROLOGIC: No focal weakness or paresthesias are detected. SKIN: There are no ulcers or rashes noted. PSYCHIATRIC: The patient has a  normal affect.  DATA:  Noninvasive studies from 08/10/2020 were reviewed with the patient.  Carotid duplex reveals no change in his 40 to 59% right internal carotid artery stenosis.  No significant  stenosis in the left carotid endarterectomy site  Ankle arm index on the right is 0.82 and monophasic and on the left is 0.99 and monophasic  Imaging of his popliteal arteries reveals mild ectasia with 1 cm arteries bilaterally  MEDICAL ISSUES: Had long discussion with the patient.  His claudication is significant but remains stable.  Unclear as to the cause of his rest pain in his feet.  Does not appear to have critical ischemia but his symptoms are suggestive of this.  He is able to tolerate this and we will continue observation.  If he has any tissue loss he will notify us immediately.  I have suggested that he attempt knee-high 20 to 30 mm compression to determine if this is making any improvement regarding the swelling mainly in his feet.  If this is not giving any benefit then he will discontinue this.  We will see him again in 1 year with repeat noninvasive studies    Rosetta Posner, MD FACS [facility] [phone]  Note: Portions of this report may have been transcribed using voice recognition software.  Every effort has been made to ensure accuracy; however, inadvertent computerized transcription errors may still be present.

## 2020-08-19 DIAGNOSIS — M7989 Other specified soft tissue disorders: Secondary | ICD-10-CM

## 2020-08-20 ENCOUNTER — Telehealth: Payer: Self-pay | Admitting: Pharmacist

## 2020-08-20 DIAGNOSIS — I4821 Permanent atrial fibrillation: Secondary | ICD-10-CM

## 2020-08-20 MED ORDER — ENOXAPARIN SODIUM 120 MG/0.8ML IJ SOSY: PREFILLED_SYRINGE | INTRAMUSCULAR | 0 refills | Status: DC

## 2020-08-20 NOTE — Telephone Encounter (Signed)
Sending Rx for Lovenox for upcoming procedure

## 2020-08-21 ENCOUNTER — Ambulatory Visit (INDEPENDENT_AMBULATORY_CARE_PROVIDER_SITE_OTHER): Payer: PPO | Admitting: Pharmacist

## 2020-08-21 ENCOUNTER — Other Ambulatory Visit: Payer: Self-pay

## 2020-08-21 DIAGNOSIS — I4821 Permanent atrial fibrillation: Secondary | ICD-10-CM

## 2020-08-21 NOTE — Progress Notes (Signed)
Patient presents today for Lovenox teaching.  Total hip arthroplasty scheduled for 09/02/20.  Patient picked up 3 Lovenox '120mg'$  syringes and brought to appointment.    Educated patient on: Washing hands, sitting or lying down so he can see his abdomen, selecting an area on abdomen at least 2 inches from belly button, cleaning with alchool swab, removing needle cap, pinching skin, injecting at 90 degree angle, and using safety cap.  Patient voiced understanding.  Provided patient handouts as well which also detailed step by step instructions   Procedure: Right total hip arthroplasty - anterior Date of procedure: 09/02/20   CHA2DS2-VASc Score = 6  This indicates a 9.7% annual risk of stroke. The patient's score is based upon: CHF History: No HTN History: Yes Diabetes History: No Stroke History: Yes Vascular Disease History: Yes Age Score: 2 Gender Score: 0   CrCl 49 mL/min Platelet count 149K   Would recommend 3 day hold for Eliquis with Lovenox bridging given history of stroke

## 2020-08-21 NOTE — Patient Instructions (Addendum)
  Educated patient on: Washing hands, sitting or lying down so he can see his abdomen, selecting an area on abdomen at least 2 inches from belly button, cleaning with alchool swab, removing needle cap, pinching skin, injecting at 90 degree angle, and using safety cap.  Patient voiced understanding.   Provided patient handouts as well which also detailed step by step instructions

## 2020-08-27 NOTE — Patient Instructions (Addendum)
DUE TO COVID-19 ONLY ONE VISITOR IS ALLOWED TO COME WITH YOU AND STAY IN THE WAITING ROOM ONLY DURING PRE OP AND PROCEDURE DAY OF SURGERY. THE 2 VISITORS  MAY VISIT WITH YOU AFTER SURGERY IN YOUR PRIVATE ROOM DURING VISITING HOURS ONLY!  YOU NEED TO HAVE A COVID 19 TEST ON_8/15______ '@_______'$ , THIS TEST MUST BE DONE BEFORE SURGERY,                 [name]     Your procedure is scheduled on: 09/02/20   Report to [name] Main  Entrance   Report to admitting at 6:00 AM     Call this number if you have problems the morning of surgery [place], [place].   No food after midnight.    You may have clear liquid until 5:30 AM.    At 5:00  AM drink pre surgery drink.   Nothing by mouth after 5:30 AM.    Take these medicines the morning of surgery with A SIP OF WATER: Hydralazine, Finasteride  Stop taking _Eliqis__________on _8/13_________as instructed by _Dr. Turner____________.  Start the lovenox injections on 08/30/20  Contact your Surgeon/Cardiologist for instructions on Anticoagulant Therapy prior to surgery.                                  You may not have any metal on your body including              piercings  Do not wear jewelry, lotions, powders or deodorant                         Men may shave face and neck.   Do not bring valuables to the hospital. [name].  Contacts, dentures or bridgework may not be worn into surgery.                  Please read over the following fact sheets you were given: _____________________________________________________________________             [facility] - Preparing for Surgery Before surgery, you can play an important role.  Because skin is not sterile, your skin needs to be as free of germs as possible.  You can reduce the number of germs on your skin by washing with CHG  (chlorahexidine gluconate) soap before surgery.  CHG is an antiseptic cleaner which kills germs and bonds with the skin to continue killing germs even after washing. Please DO NOT use if you have an allergy to CHG or antibacterial soaps.  If your skin becomes reddened/irritated stop using the CHG and inform your nurse when you arrive at Short  Stay. You may shave your face/neck. Please follow these instructions carefully:  1.  Shower with CHG Soap the night before surgery and the  morning of Surgery.  2.  If you choose to wash your hair, wash your hair first as usual with your  normal  shampoo.  3.  After you shampoo, rinse your hair and body thoroughly to remove the  shampoo.  4.  Use CHG as you would any other liquid soap.  You can apply chg directly  to the skin and wash                       Gently with a scrungie or clean washcloth.  5.  Apply the CHG Soap to your body ONLY FROM THE NECK DOWN.   Do not use on face/ open                           Wound or open sores. Avoid contact with eyes, ears mouth and genitals (private parts).                       Wash face,  Genitals (private parts) with your normal soap.             6.  Wash thoroughly, paying special attention to the area where your surgery  will be performed.  7.  Thoroughly rinse your body with warm water from the neck down.  8.  DO NOT shower/wash with your normal soap after using and rinsing off  the CHG Soap.             9.  Pat yourself dry with a clean towel.            10.  Wear clean pajamas.            11.  Place clean sheets on your bed the night of your first shower and do not  sleep with pets. Day of Surgery : Do not apply any lotions/deodorants the morning of surgery.  Please wear clean clothes to the hospital/surgery center.  FAILURE TO FOLLOW THESE INSTRUCTIONS MAY RESULT IN THE CANCELLATION OF YOUR SURGERY PATIENT SIGNATURE_________________________________  NURSE  SIGNATURE__________________________________  ________________________________________________________________________   [name]  An incentive spirometer is a tool that can help keep your lungs clear and active. This tool measures how well you are filling your lungs with each breath. Taking long deep breaths may help reverse or decrease the chance of developing breathing (pulmonary) problems (especially infection) following: A long period of time when you are unable to move or be active. BEFORE THE PROCEDURE  If the spirometer includes an indicator to show your best effort, your nurse or respiratory therapist will set it to a desired goal. If possible, sit up straight or lean slightly forward. Try not to slouch. Hold the incentive spirometer in an upright position. INSTRUCTIONS FOR USE  Sit on the edge of your bed if possible, or sit up as far as you can in bed or on a chair. Hold the incentive spirometer in an upright position. Breathe out normally. Place the mouthpiece in your mouth and seal your lips tightly around it. Breathe in slowly and as deeply as possible, raising the piston or the ball toward the top of the column. Hold your breath for 3-5 seconds or for as long as possible. Allow the piston or ball to fall to the bottom of the column. Remove the mouthpiece from your mouth and breathe out normally. Rest for a few seconds and repeat Steps 1 through 7 at least 10 times every 1-2 hours when you are awake. Take your time and take a few normal breaths between deep breaths. The spirometer may include an indicator to show your best effort. Use the indicator as a goal to work toward during each repetition. After  each set of 10 deep breaths, practice coughing to be sure your lungs are clear. If you have an incision (the cut made at the time of surgery), support your incision when coughing by placing a pillow or rolled up towels firmly against it. Once you are able to get out of  bed, walk around indoors and cough well. You may stop using the incentive spirometer when instructed by your caregiver.  RISKS AND COMPLICATIONS Take your time so you do not get dizzy or light-headed. If you are in pain, you may need to take or ask for pain medication before doing incentive spirometry. It is harder to take a deep breath if you are having pain. AFTER USE Rest and breathe slowly and easily. It can be helpful to keep track of a log of your progress. Your caregiver can provide you with a simple table to help with this. If you are using the spirometer at home, follow these instructions: [place] IF:  You are having difficultly using the spirometer. You have trouble using the spirometer as often as instructed. Your pain medication is not giving enough relief while using the spirometer. You develop fever of 100.5 F (38.1 C) or higher. SEEK IMMEDIATE MEDICAL CARE IF:  You cough up bloody sputum that had not been present before. You develop fever of 102 F (38.9 C) or greater. You develop worsening pain at or near the incision site. MAKE SURE YOU:  Understand these instructions. Will watch your condition. Will get help right away if you are not doing well or get worse. Document Released: 05/16/2006 Document Revised: 03/28/2011 Document Reviewed: 07/17/2006 [employer] Patient Information 2014 [place], [place].   ________________________________________________________________________

## 2020-08-28 ENCOUNTER — Encounter (HOSPITAL_COMMUNITY)
Admission: RE | Admit: 2020-08-28 | Discharge: 2020-08-28 | Disposition: A | Discharge status: Home / Self Care | Payer: PPO | Source: Ambulatory Visit | Attending: Orthopedic Surgery | Admitting: Orthopedic Surgery

## 2020-08-28 ENCOUNTER — Encounter (HOSPITAL_COMMUNITY): Payer: Self-pay

## 2020-08-28 ENCOUNTER — Other Ambulatory Visit: Payer: Self-pay

## 2020-08-28 DIAGNOSIS — Z01812 Encounter for preprocedural laboratory examination: Secondary | ICD-10-CM | POA: Diagnosis not present

## 2020-08-28 LAB — SURGICAL PCR SCREEN
MRSA, PCR: NEGATIVE
Staphylococcus aureus: NEGATIVE

## 2020-08-28 LAB — COMPREHENSIVE METABOLIC PANEL
ALT: 28 U/L (ref 0–44)
AST: 30 U/L (ref 15–41)
Albumin: 4.5 g/dL (ref 3.5–5.0)
Alkaline Phosphatase: 38 U/L (ref 38–126)
Anion gap: 8 (ref 5–15)
BUN: 29 mg/dL — ABNORMAL HIGH (ref 8–23)
CO2: 26 mmol/L (ref 22–32)
Calcium: 9.8 mg/dL (ref 8.9–10.3)
Chloride: 107 mmol/L (ref 98–111)
Creatinine, Ser: 1.17 mg/dL (ref 0.61–1.24)
GFR, Estimated: >60 mL/min (ref >60)
Glucose, Bld: 107 mg/dL — ABNORMAL HIGH (ref 70–99)
Potassium: 5 mmol/L (ref 3.5–5.1)
Sodium: 141 mmol/L (ref 135–145)
Total Bilirubin: 0.8 mg/dL (ref 0.3–1.2)
Total Protein: 7.4 g/dL (ref 6.5–8.1)

## 2020-08-28 LAB — CBC
HCT: 40.9 % (ref 39.0–52.0)
Hemoglobin: 13.1 g/dL (ref 13.0–17.0)
MCH: 31.6 pg (ref 26.0–34.0)
MCHC: 32 g/dL (ref 30.0–36.0)
MCV: 98.8 fL (ref 80.0–100.0)
Platelets: 143 10*3/uL — ABNORMAL LOW (ref 150–400)
RBC: 4.14 MIL/uL — ABNORMAL LOW (ref 4.22–5.81)
RDW: 13.3 % (ref 11.5–15.5)
WBC: 4.4 10*3/uL (ref 4.0–10.5)
nRBC: 0 % (ref 0.0–0.2)

## 2020-08-28 LAB — APTT: aPTT: 34 seconds (ref 24–36)

## 2020-08-28 LAB — PROTIME-INR
INR: 1.4 — ABNORMAL HIGH (ref 0.8–1.2)
Prothrombin Time: 16.8 seconds — ABNORMAL HIGH (ref 11.4–15.2)

## 2020-08-28 NOTE — Progress Notes (Addendum)
  COVID Vaccine Completed:Yes Date COVID Vaccine completed:03/27/19, Booster 11/10/19, 05/12/20 COVID vaccine manufacturer:   Moderna    Covid test  date 08/31/20   PCP - Dr. Brayton Layman [place] 07/07/20 Cardiologist - Dr. Ashok Norris LOV 08/21/20, clearance 06/03/20 epic  Chest x-ray - no EKG - 04/21/20-epic Stress Test - no ECHO - 01/23/19-epic Cardiac Cath - 2002 with 2 stents, 2017, CABG x3 2017 Pacemaker/ICD device last checked:NA  Sleep Study - yes negative findings CPAP - no  Fasting Blood Sugar - NA Checks Blood Sugar _____ times a day  Blood Thinner Instructions:[place]/ Dr. Radford Pax Aspirin Instructions: Stop 3 days prior to DOS. Take Lovenox injections starting 3 days prior./ Dr. Radford Pax Last Dose:08/29/20  Anesthesia review: yes  Patient denies shortness of breath, fever, cough and chest pain at PAT appointment Pt reports no SOB with activities.  Patient verbalized understanding of instructions that were given to them at the PAT appointment. Patient was also instructed that they will need to review over the PAT instructions again at home before surgery. yes

## 2020-08-31 ENCOUNTER — Other Ambulatory Visit: Payer: Self-pay | Admitting: Orthopedic Surgery

## 2020-08-31 NOTE — Progress Notes (Signed)
Anesthesia Chart Review:   Case: [id number] Date/Time: 09/02/20 0815   Procedure: TOTAL HIP ARTHROPLASTY ANTERIOR APPROACH (Right: Hip)   Anesthesia type: Choice   Pre-op diagnosis: Right hip osteoarthritis   Location: [facility] ROOM 10 / [facility]   Surgeons: Gaynelle Arabian, MD       DISCUSSION: Pt is 80 years old with hx CAD (s/p CABG 2017; s/p remote stenting), carotid artery disease (s/p L CEA 2017), HTN, AAA (s/p repair with aortobifem BG 2003), stroke, PAF  - PT 16.8, INR 1.4.  - Pt to hold eliquis 3 days before surgery and start lovenox bridge.   Will defer to assigned anesthesiologist whether or not to recheck PT/INT day of surgery   VS: BP 139/75   Pulse (!) 57   Temp (!) 36.4 C (Oral)   Resp 19   Ht 6' (1.829 m)   Wt 82.6 kg   BMI 24.68 kg/m   PROVIDERS: - PCP is Josetta Huddle, MD - Cardiologist is Fransico Him, MD, last office visit 01/15/20. Cleared for surgery 07/07/20 by Sande Rives, PA - EP cardiologist is Allegra Lai MD. Last office visit 04/21/20 with Malka So, PA in afib clinic.  - Vascular surgeon is Curt Jews, MD   LABS:  - PT 16.8, INR 1.4. Pt still on eliquis at the time of this result.   (all labs ordered are listed, but only abnormal results are displayed)  Labs Reviewed  CBC - Abnormal; Notable for the following components:      Result Value   RBC 4.14 (*)    Platelets 143 (*)    All other components within normal limits  COMPREHENSIVE METABOLIC PANEL - Abnormal; Notable for the following components:   Glucose, Bld 107 (*)    BUN 29 (*)    All other components within normal limits  PROTIME-INR - Abnormal; Notable for the following components:   Prothrombin Time 16.8 (*)    INR 1.4 (*)    All other components within normal limits  SURGICAL PCR SCREEN  APTT  TYPE AND SCREEN     EKG 04/21/20: Atrial fibrillation with slow ventricular response.  Incomplete RBBB. LAFB. Cannot rule out Anterior infarct, age undetermined   CV: Carotid  duplex 08/10/20: - Right Carotid: Velocities in the right ICA are consistent with a 40-59% stenosis.  - Left Carotid: Velocities in the left ICA are consistent with a 1-39% stenosis.  Echo 01/23/19:  1. Left ventricular ejection fraction, by visual estimation, is 60 to 65%. The left ventricle has normal function. There is mildly increased left ventricular hypertrophy.   2. Left ventricular diastolic parameters are indeterminate.   3. Right ventricular volume/pressure overload.   4. The left ventricle has no regional wall motion bnormalities.   5. Global right ventricle has mildly reduced systolic function.The right ventricular size is mildly enlarged. No increase in right ventricular wall thickness.   6. Left atrial size was moderately dilated.   7. Right atrial size was moderately dilated.   8. The mitral valve is normal in structure. Mild mitral valve regurgitation.   9. The tricuspid valve is normal in structure.  10. The aortic valve is tricuspid. Aortic valve regurgitation is trivial.  11. The pulmonic valve was grossly normal. Pulmonic valve regurgitation is trivial.  12. Mildly elevated pulmonary artery systolic pressure.  13. The atrial septum is grossly normal.  14. The average left ventricular global longitudinal strain is -19.8 %.   Cardiac event monitor 08/20/18:  Sinus bradycardia and normal  sinus rhythm with average heart rate 54bpm. The heart rate ranged from 37-112bpm PVCs and ventricular couplets. Frequent runs of nonsustained atrial tachycardia up to 7 beats in a row. Sinus bradycardia occurred during sleep  Cardiac cath 12/16/15 (pre-CABG):  The left ventricular systolic function is normal. LV end diastolic pressure is normal. The left ventricular ejection fraction is 55-65% by visual estimate. There is no aortic valve stenosis. Prox RCA lesion, 70 %stenosed. LM lesion, 60 %stenosed. Ost LM to LM lesion, 70 %stenosed. Ost 1st Mrg to 1st Mrg lesion, 40 %stenosed. Ost  LAD to Prox LAD lesion, 30 %stenosed. Ost 1st Diag to 1st Diag lesion, 20 %stenosed. 1. Significant left main and RCA disease. Patent stents in the LAD and OM1. 2. Normal LV systolic function with normal left ventricular end-diastolic pressure. 3. Severe mitral annular calcifications.  Past Medical History:  Diagnosis Date   Arthritis    HANDS AND FEET   Asymptomatic carotid artery stenosis BILATERAL   PER DOPPLER  03/2016  RICA  1 - 39%/  s/p  L CEA  1-39%- followed by Dr. 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   Nocturia    Paroxysmal atrial fibrillation (HCC) 06/30/2014   chad2vasc score is at least 4   Popliteal artery aneurysm (HCC) LEFT   PVD (peripheral vascular disease) (HCC)    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   Retinal detachment    SBO (small bowel obstruction) (HCC)    Stroke (HCC)    mini stroke/2017    Past Surgical History:  Procedure Laterality Date   AAA REPAIR W/ AORTOBIFEMORAL BYPASS  OCT  91478   ABDOMINAL HERNIA REPAIR  X4  (LAST ONE 1990'S)   inguinal herniorrhaphy   CARDIAC CATHETERIZATION N/A 12/16/2015   Procedure: Left Heart Cath and Coronary Angiography;  Surgeon: Wellington Hampshire, MD;  Location: [facility];  Service: Cardiovascular;  Laterality: N/A;   CARPAL TUNNEL RELEASE Right 10/2011   Hand   CORONARY ANGIOPLASTY  2002   PTCA  W/ X2 BM STENTS--   PROXIMA LAD  &  LEFT CIRCUMFLEX TO FIRST OBTUSE MARGINAL BRANCH   CORONARY ARTERY BYPASS GRAFT N/A 12/18/2015   Procedure: CORONARY ARTERY BYPASS GRAFTING (CABG)  x 3, using left internal mammary artery and bilateral   leg greater saphenous vein harvested endoscopically  LIMA to LAD, SVG to Circumflex, SVG to RCA;  Surgeon: Grace Isaac, MD;  Location: [facility];  Service: Open Heart Surgery;  Laterality: N/A;   CORONARY ARTERY BYPASS GRAFT     3 vessel   ENDARTERECTOMY Left 03/11/2015   Procedure: ENDARTERECTOMY LEFT CAROTID;  Surgeon: Rosetta Posner, MD;  Location: [place];  Service: Vascular;  Laterality: Left;   EXPLORATORY LAPAROTOMY W/ ENTEROLYSIS FOR PARTIAL SBO  09/23/2008   heart stents  02/19/2000   HYDROCELE EXCISION  01/24/2011   Procedure: HYDROCELECTOMY ADULT;  Surgeon: Bernestine Amass, MD;  Location[facility];  Service: Urology;  Laterality: Left;  Ingrown Toe Nail Right 01/2013   Right Great Toe nail   intest blockage  09/22/2008   PATCH ANGIOPLASTY Left 03/11/2015   Procedure: PATCH ANGIOPLASTY LEFT CAROTID USING HEMASHIELD PLATINUM FINESSE PATCH;  Surgeon: Rosetta Posner, MD;  Location: [place];  Service: Vascular;  Laterality: Left;   PULLEY RELEASE -- RIGHT 5TH FINGER  2009   RETINAL DETACHMENT SURGERY  2004   BILATERAL   TEE WITHOUT CARDIOVERSION N/A 12/18/2015   Procedure: TRANSESOPHAGEAL ECHOCARDIOGRAM (TEE);  Surgeon: Grace Isaac, MD;  Location: [place];  Service: Open Heart Surgery;  Laterality: N/A;   TOOTH EXTRACTION  12/2011    MEDICATIONS:  acetaminophen (TYLENOL) 500 MG tablet   apixaban (ELIQUIS) 5 MG TABS tablet   Apoaequorin (PREVAGEN) 10 MG CAPS   Artificial Tear Solution (SOOTHE XP) SOLN   b complex vitamins tablet   calcium carbonate (OS-CAL) 600 MG TABS   Cholecalciferol 1000 UNITS capsule   Coenzyme Q10 (CO Q 10) 100 MG CAPS   cyanocobalamin 1000 MCG tablet   enoxaparin (LOVENOX) 120 MG/0.8ML injection   EPINEPHrine 0.3 mg/0.3 mL IJ SOAJ injection   finasteride (PROSCAR) 5 MG tablet   hydrALAZINE (APRESOLINE) 25 MG tablet   losartan (COZAAR) 50 MG tablet   Magnesium Hydroxide (MAGNESIA  PO)   Multiple Vitamin (MULTIVITAMIN) tablet   Omega-3 Fatty Acids (FISH OIL PO)   potassium gluconate 595 (99 K) MG TABS tablet   rosuvastatin (CRESTOR) 40 MG tablet   Tamsulosin HCl (FLOMAX) 0.4 MG CAPS   Turmeric (QC TUMERIC COMPLEX) 500 MG CAPS   vitamin C (ASCORBIC ACID) 500 MG tablet   zinc gluconate 50 MG tablet   No current facility-administered medications for this encounter.   Pt to hold eliquis 3 days before surgery and start lovenox bridge.    Will defer to assigned anesthesiologist whether or not to recheck PT/INT day of surgery  If no changes, I anticipate pt can proceed with surgery as scheduled.    Marcus Cass, PhD, FNP-BC [facility]/Anesthesiology Phone: [phone] 08/31/2020 10:14 AM

## 2020-08-31 NOTE — Anesthesia Preprocedure Evaluation (Addendum)
Anesthesia Evaluation  Patient identified by MRN, date of birth, ID band Patient awake    Reviewed: Allergy & Precautions, NPO status , Patient's Chart, lab work & pertinent test results  Airway Mallampati: II  TM Distance: >3 FB Neck ROM: Full    Dental   Pulmonary former smoker,    breath sounds clear to auscultation       Cardiovascular hypertension, Pt. on medications + CAD, + Cardiac Stents, + CABG and + Peripheral Vascular Disease  + dysrhythmias Atrial Fibrillation  Rhythm:Irregular Rate:Normal     Neuro/Psych  Headaches, CVA    GI/Hepatic negative GI ROS, Neg liver ROS,   Endo/Other  negative endocrine ROS  Renal/GU Renal disease     Musculoskeletal  (+) Arthritis ,   Abdominal   Peds  Hematology negative hematology ROS (+) Last dose of eliquis 8/13. Last dose of lovenox 8/16 in the AM   Anesthesia Other Findings   Reproductive/Obstetrics                            Lab Results  Component Value Date   WBC 4.4 08/28/2020   HGB 13.1 08/28/2020   HCT 40.9 08/28/2020   MCV 98.8 08/28/2020   PLT 143 (L) 08/28/2020   Lab Results  Component Value Date   CREATININE 1.17 08/28/2020   BUN 29 (H) 08/28/2020   NA 141 08/28/2020   K 5.0 08/28/2020   CL 107 08/28/2020   CO2 26 08/28/2020    Anesthesia Physical Anesthesia Plan  ASA: 3  Anesthesia Plan: Spinal   Post-op Pain Management:    Induction:   PONV Risk Score and Plan: 1 and Propofol infusion, Ondansetron and Treatment may vary due to age or medical condition  Airway Management Planned: Natural Airway and Simple Face Mask  Additional Equipment:   Intra-op Plan:   Post-operative Plan:   Informed Consent: I have reviewed the patients History and Physical, chart, labs and discussed the procedure including the risks, benefits and alternatives for the proposed anesthesia with the patient or authorized representative who  has indicated his/her understanding and acceptance.     Dental advisory given  Plan Discussed with: CRNA  Anesthesia Plan Comments: (See APP note by Durel Salts, FNP )       Anesthesia Quick Evaluation

## 2020-09-01 LAB — SARS CORONAVIRUS 2 (TAT 6-24 HRS): SARS Coronavirus 2: NEGATIVE

## 2020-09-02 ENCOUNTER — Observation Stay (HOSPITAL_COMMUNITY)
Admission: RE | Admit: 2020-09-02 | Discharge: 2020-09-04 | Disposition: A | Discharge status: Home / Self Care | Payer: PPO | Attending: Orthopedic Surgery | Admitting: Orthopedic Surgery

## 2020-09-02 ENCOUNTER — Ambulatory Visit (HOSPITAL_COMMUNITY): Payer: PPO | Admitting: Emergency Medicine

## 2020-09-02 ENCOUNTER — Other Ambulatory Visit: Payer: Self-pay

## 2020-09-02 ENCOUNTER — Encounter (HOSPITAL_COMMUNITY)
Admission: RE | Disposition: A | Discharge status: Home / Self Care | Payer: Self-pay | Source: Home / Self Care | Attending: Orthopedic Surgery

## 2020-09-02 ENCOUNTER — Observation Stay (HOSPITAL_COMMUNITY): Payer: PPO

## 2020-09-02 ENCOUNTER — Ambulatory Visit (HOSPITAL_COMMUNITY): Payer: PPO

## 2020-09-02 ENCOUNTER — Encounter (HOSPITAL_COMMUNITY): Payer: Self-pay | Admitting: Orthopedic Surgery

## 2020-09-02 ENCOUNTER — Ambulatory Visit (HOSPITAL_COMMUNITY): Payer: PPO | Admitting: Anesthesiology

## 2020-09-02 DIAGNOSIS — M169 Osteoarthritis of hip, unspecified: Secondary | ICD-10-CM | POA: Diagnosis present

## 2020-09-02 DIAGNOSIS — I4821 Permanent atrial fibrillation: Secondary | ICD-10-CM | POA: Insufficient documentation

## 2020-09-02 DIAGNOSIS — Z96641 Presence of right artificial hip joint: Secondary | ICD-10-CM | POA: Diagnosis not present

## 2020-09-02 DIAGNOSIS — Z87891 Personal history of nicotine dependence: Secondary | ICD-10-CM | POA: Diagnosis not present

## 2020-09-02 DIAGNOSIS — M1611 Unilateral primary osteoarthritis, right hip: Secondary | ICD-10-CM | POA: Diagnosis not present

## 2020-09-02 DIAGNOSIS — Z79899 Other long term (current) drug therapy: Secondary | ICD-10-CM | POA: Insufficient documentation

## 2020-09-02 DIAGNOSIS — E785 Hyperlipidemia, unspecified: Secondary | ICD-10-CM | POA: Diagnosis not present

## 2020-09-02 DIAGNOSIS — Z951 Presence of aortocoronary bypass graft: Secondary | ICD-10-CM | POA: Diagnosis not present

## 2020-09-02 DIAGNOSIS — I2510 Atherosclerotic heart disease of native coronary artery without angina pectoris: Secondary | ICD-10-CM | POA: Diagnosis not present

## 2020-09-02 DIAGNOSIS — Z7901 Long term (current) use of anticoagulants: Secondary | ICD-10-CM | POA: Diagnosis not present

## 2020-09-02 DIAGNOSIS — I10 Essential (primary) hypertension: Secondary | ICD-10-CM | POA: Diagnosis not present

## 2020-09-02 DIAGNOSIS — Z96649 Presence of unspecified artificial hip joint: Secondary | ICD-10-CM

## 2020-09-02 DIAGNOSIS — Z471 Aftercare following joint replacement surgery: Secondary | ICD-10-CM | POA: Diagnosis not present

## 2020-09-02 DIAGNOSIS — Z419 Encounter for procedure for purposes other than remedying health state, unspecified: Secondary | ICD-10-CM

## 2020-09-02 HISTORY — PX: TOTAL HIP ARTHROPLASTY: SHX124

## 2020-09-02 LAB — TYPE AND SCREEN
ABO/RH(D): O POS
Antibody Screen: NEGATIVE

## 2020-09-02 SURGERY — ARTHROPLASTY, HIP, TOTAL, ANTERIOR APPROACH
Anesthesia: Spinal | Site: Hip | Laterality: Right

## 2020-09-02 MED ORDER — FENTANYL CITRATE (PF) 100 MCG/2ML IJ SOLN
INTRAMUSCULAR | Status: AC
  Filled 2020-09-02: qty 2

## 2020-09-02 MED ORDER — FENTANYL CITRATE (PF) 100 MCG/2ML IJ SOLN: 25.0000 ug | INTRAMUSCULAR | Status: DC | PRN

## 2020-09-02 MED ORDER — LOSARTAN POTASSIUM 50 MG PO TABS
50.0000 mg | ORAL_TABLET | Freq: Every day | ORAL | Status: DC
  Administered 2020-09-03 – 2020-09-04 (×2): 50 mg via ORAL
  Filled 2020-09-02 (×2): qty 1

## 2020-09-02 MED ORDER — APIXABAN 2.5 MG PO TABS
2.5000 mg | ORAL_TABLET | Freq: Two times a day (BID) | ORAL | Status: DC
  Administered 2020-09-03 – 2020-09-04 (×3): 2.5 mg via ORAL
  Filled 2020-09-02 (×3): qty 1

## 2020-09-02 MED ORDER — FENTANYL CITRATE (PF) 100 MCG/2ML IJ SOLN
INTRAMUSCULAR | Status: AC
  Administered 2020-09-02: 25 ug via INTRAVENOUS
  Filled 2020-09-02: qty 2

## 2020-09-02 MED ORDER — 0.9 % SODIUM CHLORIDE (POUR BTL) OPTIME
TOPICAL | Status: DC | PRN
  Administered 2020-09-02: 1000 mL

## 2020-09-02 MED ORDER — POVIDONE-IODINE 10 % EX SWAB
2.0000 "application " | Freq: Once | CUTANEOUS | Status: AC
  Administered 2020-09-02: 2 via TOPICAL

## 2020-09-02 MED ORDER — LACTATED RINGERS IV SOLN: INTRAVENOUS | Status: DC

## 2020-09-02 MED ORDER — MENTHOL 3 MG MT LOZG
1.0000 | LOZENGE | OROMUCOSAL | Status: DC | PRN
  Administered 2020-09-02: 3 mg via ORAL
  Filled 2020-09-02: qty 9

## 2020-09-02 MED ORDER — FENTANYL CITRATE (PF) 100 MCG/2ML IJ SOLN
INTRAMUSCULAR | Status: DC | PRN
  Administered 2020-09-02 (×2): 50 ug via INTRAVENOUS

## 2020-09-02 MED ORDER — LIDOCAINE 2% (20 MG/ML) 5 ML SYRINGE
INTRAMUSCULAR | Status: DC | PRN
Start: 2020-09-02 — End: 2020-09-02
  Administered 2020-09-02: 50 mg via INTRAVENOUS

## 2020-09-02 MED ORDER — CHLORHEXIDINE GLUCONATE 0.12 % MT SOLN
15.0000 mL | Freq: Once | OROMUCOSAL | Status: AC
  Administered 2020-09-02: 15 mL via OROMUCOSAL

## 2020-09-02 MED ORDER — METHOCARBAMOL 500 MG IVPB - SIMPLE MED
500.0000 mg | Freq: Four times a day (QID) | INTRAVENOUS | Status: DC | PRN
  Administered 2020-09-02: 500 mg via INTRAVENOUS
  Filled 2020-09-02: qty 50

## 2020-09-02 MED ORDER — ONDANSETRON HCL 4 MG PO TABS: 4.0000 mg | ORAL_TABLET | Freq: Four times a day (QID) | ORAL | Status: DC | PRN

## 2020-09-02 MED ORDER — TAMSULOSIN HCL 0.4 MG PO CAPS
0.8000 mg | ORAL_CAPSULE | Freq: Every day | ORAL | Status: DC
  Administered 2020-09-02 – 2020-09-03 (×2): 0.8 mg via ORAL
  Filled 2020-09-02 (×2): qty 2

## 2020-09-02 MED ORDER — CEFAZOLIN SODIUM-DEXTROSE 2-4 GM/100ML-% IV SOLN
2.0000 g | INTRAVENOUS | Status: AC
  Administered 2020-09-02: 2 g via INTRAVENOUS
  Filled 2020-09-02: qty 100

## 2020-09-02 MED ORDER — PROPOFOL 1000 MG/100ML IV EMUL
INTRAVENOUS | Status: AC
  Filled 2020-09-02: qty 100

## 2020-09-02 MED ORDER — ORAL CARE MOUTH RINSE: 15.0000 mL | Freq: Once | OROMUCOSAL | Status: AC

## 2020-09-02 MED ORDER — HYDROCODONE-ACETAMINOPHEN 7.5-325 MG PO TABS
1.0000 | ORAL_TABLET | ORAL | Status: DC | PRN
  Administered 2020-09-02 – 2020-09-03 (×3): 2 via ORAL
  Filled 2020-09-02 (×3): qty 2

## 2020-09-02 MED ORDER — BUPIVACAINE HCL 0.25 % IJ SOLN
INTRAMUSCULAR | Status: DC | PRN
  Administered 2020-09-02: 30 mL

## 2020-09-02 MED ORDER — SODIUM CHLORIDE 0.9 % IV SOLN: INTRAVENOUS | Status: DC

## 2020-09-02 MED ORDER — HYDRALAZINE HCL 25 MG PO TABS
25.0000 mg | ORAL_TABLET | Freq: Every day | ORAL | Status: DC
  Administered 2020-09-03 – 2020-09-04 (×2): 25 mg via ORAL
  Filled 2020-09-02 (×2): qty 1

## 2020-09-02 MED ORDER — HYDRALAZINE HCL 25 MG PO TABS: 25.0000 mg | ORAL_TABLET | ORAL | Status: DC

## 2020-09-02 MED ORDER — BUPIVACAINE-EPINEPHRINE (PF) 0.25% -1:200000 IJ SOLN
INTRAMUSCULAR | Status: AC
  Filled 2020-09-02: qty 30

## 2020-09-02 MED ORDER — HYDROCODONE-ACETAMINOPHEN 5-325 MG PO TABS
1.0000 | ORAL_TABLET | ORAL | Status: DC | PRN
Start: 2020-09-02 — End: 2020-09-04
  Administered 2020-09-03 – 2020-09-04 (×3): 2 via ORAL
  Filled 2020-09-02 (×3): qty 2

## 2020-09-02 MED ORDER — PROPOFOL 10 MG/ML IV BOLUS
INTRAVENOUS | Status: DC | PRN
  Administered 2020-09-02: 20 mg via INTRAVENOUS

## 2020-09-02 MED ORDER — TRANEXAMIC ACID-NACL 1000-0.7 MG/100ML-% IV SOLN
1000.0000 mg | INTRAVENOUS | Status: AC
  Administered 2020-09-02: 1000 mg via INTRAVENOUS
  Filled 2020-09-02: qty 100

## 2020-09-02 MED ORDER — PHENYLEPHRINE HCL-NACL 20-0.9 MG/250ML-% IV SOLN
INTRAVENOUS | Status: AC
  Filled 2020-09-02: qty 500

## 2020-09-02 MED ORDER — ROSUVASTATIN CALCIUM 20 MG PO TABS
40.0000 mg | ORAL_TABLET | Freq: Every evening | ORAL | Status: DC
  Administered 2020-09-03: 40 mg via ORAL
  Filled 2020-09-02: qty 2

## 2020-09-02 MED ORDER — FINASTERIDE 5 MG PO TABS
5.0000 mg | ORAL_TABLET | Freq: Every day | ORAL | Status: DC
  Administered 2020-09-02 – 2020-09-03 (×2): 5 mg via ORAL
  Filled 2020-09-02 (×2): qty 1

## 2020-09-02 MED ORDER — BUPIVACAINE IN DEXTROSE 0.75-8.25 % IT SOLN
INTRATHECAL | Status: DC | PRN
  Administered 2020-09-02: 2 mL via INTRATHECAL

## 2020-09-02 MED ORDER — ONDANSETRON HCL 4 MG/2ML IJ SOLN: 4.0000 mg | Freq: Four times a day (QID) | INTRAMUSCULAR | Status: DC | PRN

## 2020-09-02 MED ORDER — ACETAMINOPHEN 10 MG/ML IV SOLN
1000.0000 mg | Freq: Four times a day (QID) | INTRAVENOUS | Status: DC
  Filled 2020-09-02: qty 100

## 2020-09-02 MED ORDER — WATER FOR IRRIGATION, STERILE IR SOLN
Status: DC | PRN
  Administered 2020-09-02: 2000 mL

## 2020-09-02 MED ORDER — PHENOL 1.4 % MT LIQD: 1.0000 | OROMUCOSAL | Status: DC | PRN

## 2020-09-02 MED ORDER — ONDANSETRON HCL 4 MG/2ML IJ SOLN
INTRAMUSCULAR | Status: DC | PRN
  Administered 2020-09-02: 4 mg via INTRAVENOUS

## 2020-09-02 MED ORDER — PROPOFOL 500 MG/50ML IV EMUL
INTRAVENOUS | Status: AC
  Filled 2020-09-02: qty 50

## 2020-09-02 MED ORDER — DEXAMETHASONE SODIUM PHOSPHATE 10 MG/ML IJ SOLN
INTRAMUSCULAR | Status: AC
  Filled 2020-09-02: qty 1

## 2020-09-02 MED ORDER — CHLORHEXIDINE GLUCONATE CLOTH 2 % EX PADS
6.0000 | MEDICATED_PAD | Freq: Every day | CUTANEOUS | Status: DC
  Administered 2020-09-03: 6 via TOPICAL

## 2020-09-02 MED ORDER — LIDOCAINE 2% (20 MG/ML) 5 ML SYRINGE
INTRAMUSCULAR | Status: AC
  Filled 2020-09-02: qty 5

## 2020-09-02 MED ORDER — DOCUSATE SODIUM 100 MG PO CAPS
100.0000 mg | ORAL_CAPSULE | Freq: Two times a day (BID) | ORAL | Status: DC
  Administered 2020-09-02 – 2020-09-04 (×4): 100 mg via ORAL
  Filled 2020-09-02 (×4): qty 1

## 2020-09-02 MED ORDER — DEXAMETHASONE SODIUM PHOSPHATE 10 MG/ML IJ SOLN
10.0000 mg | Freq: Once | INTRAMUSCULAR | Status: AC
  Administered 2020-09-03: 10 mg via INTRAVENOUS
  Filled 2020-09-02: qty 1

## 2020-09-02 MED ORDER — METHOCARBAMOL 500 MG PO TABS
500.0000 mg | ORAL_TABLET | Freq: Four times a day (QID) | ORAL | Status: DC | PRN
  Administered 2020-09-02 – 2020-09-03 (×2): 500 mg via ORAL
  Filled 2020-09-02 (×2): qty 1

## 2020-09-02 MED ORDER — BUPIVACAINE HCL (PF) 0.25 % IJ SOLN
INTRAMUSCULAR | Status: AC
  Filled 2020-09-02: qty 30

## 2020-09-02 MED ORDER — MORPHINE SULFATE (PF) 2 MG/ML IV SOLN
0.5000 mg | INTRAVENOUS | Status: DC | PRN
  Administered 2020-09-02: 1 mg via INTRAVENOUS
  Filled 2020-09-02: qty 1

## 2020-09-02 MED ORDER — ACETAMINOPHEN 325 MG PO TABS: 325.0000 mg | ORAL_TABLET | Freq: Four times a day (QID) | ORAL | Status: DC | PRN

## 2020-09-02 MED ORDER — METHOCARBAMOL 500 MG IVPB - SIMPLE MED
INTRAVENOUS | Status: AC
  Filled 2020-09-02: qty 50

## 2020-09-02 MED ORDER — METOCLOPRAMIDE HCL 5 MG/ML IJ SOLN: 5.0000 mg | Freq: Three times a day (TID) | INTRAMUSCULAR | Status: DC | PRN

## 2020-09-02 MED ORDER — MEPERIDINE HCL 50 MG/ML IJ SOLN
6.2500 mg | INTRAMUSCULAR | Status: DC | PRN
  Administered 2020-09-02 (×2): 6.25 mg via INTRAVENOUS

## 2020-09-02 MED ORDER — PROPOFOL 500 MG/50ML IV EMUL
INTRAVENOUS | Status: DC | PRN
  Administered 2020-09-02: 100 ug/kg/min via INTRAVENOUS

## 2020-09-02 MED ORDER — ONDANSETRON HCL 4 MG/2ML IJ SOLN
INTRAMUSCULAR | Status: AC
  Filled 2020-09-02: qty 2

## 2020-09-02 MED ORDER — CEFAZOLIN SODIUM-DEXTROSE 2-4 GM/100ML-% IV SOLN
2.0000 g | Freq: Four times a day (QID) | INTRAVENOUS | Status: AC
  Administered 2020-09-02 (×2): 2 g via INTRAVENOUS
  Filled 2020-09-02 (×2): qty 100

## 2020-09-02 MED ORDER — BUPIVACAINE HCL 0.25 % IJ SOLN
INTRAMUSCULAR | Status: AC
  Filled 2020-09-02: qty 1

## 2020-09-02 MED ORDER — METOCLOPRAMIDE HCL 5 MG PO TABS: 5.0000 mg | ORAL_TABLET | Freq: Three times a day (TID) | ORAL | Status: DC | PRN

## 2020-09-02 MED ORDER — TRAMADOL HCL 50 MG PO TABS
50.0000 mg | ORAL_TABLET | Freq: Four times a day (QID) | ORAL | Status: DC | PRN
  Administered 2020-09-03 (×2): 100 mg via ORAL
  Filled 2020-09-02 (×2): qty 2

## 2020-09-02 MED ORDER — BISACODYL 10 MG RE SUPP: 10.0000 mg | Freq: Every day | RECTAL | Status: DC | PRN

## 2020-09-02 MED ORDER — HYDRALAZINE HCL 50 MG PO TABS
50.0000 mg | ORAL_TABLET | Freq: Every day | ORAL | Status: DC
  Administered 2020-09-03: 50 mg via ORAL
  Filled 2020-09-02 (×2): qty 1

## 2020-09-02 MED ORDER — MEPERIDINE HCL 50 MG/ML IJ SOLN
INTRAMUSCULAR | Status: AC
  Filled 2020-09-02: qty 1

## 2020-09-02 MED ORDER — POLYETHYLENE GLYCOL 3350 17 G PO PACK
17.0000 g | PACK | Freq: Every day | ORAL | Status: DC | PRN
  Administered 2020-09-03: 17 g via ORAL
  Filled 2020-09-02: qty 1

## 2020-09-02 MED ORDER — DEXAMETHASONE SODIUM PHOSPHATE 10 MG/ML IJ SOLN
8.0000 mg | Freq: Once | INTRAMUSCULAR | Status: AC
  Administered 2020-09-02: 8 mg via INTRAVENOUS

## 2020-09-02 SURGICAL SUPPLY — 44 items
BAG COUNTER SPONGE SURGICOUNT (BAG) ×1 IMPLANT
BAG DECANTER FOR FLEXI CONT (MISCELLANEOUS) IMPLANT
BAG SPEC THK2 15X12 ZIP CLS (MISCELLANEOUS)
BAG SPNG CNTER NS LX DISP (BAG) ×1
BAG ZIPLOCK 12X15 (MISCELLANEOUS) IMPLANT
BLADE SAG 18X100X1.27 (BLADE) ×2 IMPLANT
COVER PERINEAL POST (MISCELLANEOUS) ×2 IMPLANT
COVER SURGICAL LIGHT HANDLE (MISCELLANEOUS) ×2 IMPLANT
CUP ACET PINNACLE SECTR 56MM (Hips) IMPLANT
DECANTER SPIKE VIAL GLASS SM (MISCELLANEOUS) ×2 IMPLANT
DRAPE FOOT SWITCH (DRAPES) ×2 IMPLANT
DRAPE STERI IOBAN 125X83 (DRAPES) ×2 IMPLANT
DRAPE U-SHAPE 47X51 STRL (DRAPES) ×4 IMPLANT
DRSG AQUACEL AG ADV 3.5X10 (GAUZE/BANDAGES/DRESSINGS) ×2 IMPLANT
DURAPREP 26ML APPLICATOR (WOUND CARE) ×2 IMPLANT
ELECT REM PT RETURN 15FT ADLT (MISCELLANEOUS) ×2 IMPLANT
GLOVE SRG 8 PF TXTR STRL LF DI (GLOVE) ×1 IMPLANT
GLOVE SURG ENC MOIS LTX SZ6.5 (GLOVE) ×2 IMPLANT
GLOVE SURG ENC MOIS LTX SZ7 (GLOVE) ×2 IMPLANT
GLOVE SURG ENC MOIS LTX SZ8 (GLOVE) ×4 IMPLANT
GLOVE SURG UNDER POLY LF SZ7 (GLOVE) ×2 IMPLANT
GLOVE SURG UNDER POLY LF SZ8 (GLOVE) ×2
GLOVE SURG UNDER POLY LF SZ8.5 (GLOVE) ×1 IMPLANT
GOWN STRL REUS W/TWL LRG LVL3 (GOWN DISPOSABLE) ×4 IMPLANT
GOWN STRL REUS W/TWL XL LVL3 (GOWN DISPOSABLE) ×1 IMPLANT
HEAD CERAMIC 36 PLUS5 (Hips) ×1 IMPLANT
HOLDER FOLEY CATH W/STRAP (MISCELLANEOUS) ×2 IMPLANT
KIT TURNOVER KIT A (KITS) ×2 IMPLANT
LINER MARATHON 4 NEUTRAL 36X56 (Hips) ×1 IMPLANT
MANIFOLD NEPTUNE II (INSTRUMENTS) ×2 IMPLANT
PACK ANTERIOR HIP CUSTOM (KITS) ×2 IMPLANT
PENCIL SMOKE EVACUATOR COATED (MISCELLANEOUS) ×2 IMPLANT
PINNACLE SECTOR CUP 56MM (Hips) ×2 IMPLANT
STEM FEMORAL SZ8 STD ACTIS (Stem) ×1 IMPLANT
STRIP CLOSURE SKIN 1/2X4 (GAUZE/BANDAGES/DRESSINGS) ×2 IMPLANT
SUT ETHIBOND NAB CT1 #1 30IN (SUTURE) ×2 IMPLANT
SUT MNCRL AB 4-0 PS2 18 (SUTURE) ×2 IMPLANT
SUT STRATAFIX 0 PDS 27 VIOLET (SUTURE) ×2
SUT VIC AB 2-0 CT1 27 (SUTURE) ×4
SUT VIC AB 2-0 CT1 TAPERPNT 27 (SUTURE) ×2 IMPLANT
SUTURE STRATFX 0 PDS 27 VIOLET (SUTURE) ×1 IMPLANT
SYR 50ML LL SCALE MARK (SYRINGE) IMPLANT
TRAY FOLEY MTR SLVR 16FR STAT (SET/KITS/TRAYS/PACK) ×2 IMPLANT
TUBE SUCTION HIGH CAP CLEAR NV (SUCTIONS) ×2 IMPLANT

## 2020-09-02 NOTE — Discharge Instructions (Signed)
°Frank Aluisio, MD °Total Joint Specialist °[facility] °[address] °[place], [place] [address] °[phone] ° °ANTERIOR APPROACH TOTAL HIP REPLACEMENT POSTOPERATIVE DIRECTIONS ° ° ° ° °Hip Rehabilitation, Guidelines Following Surgery  °The results of a hip operation are greatly improved after range of motion and muscle strengthening exercises. Follow all safety measures which are given to protect your hip. If any of these exercises cause increased pain or swelling in your joint, decrease the amount until you are comfortable again. Then slowly increase the exercises. Call your caregiver if you have problems or questions.  ° °HOME CARE INSTRUCTIONS  °Remove items at home which could result in a fall. This includes throw rugs or furniture in walking pathways.  °ICE to the affected hip as frequently as 20-30 minutes an hour and then as needed for pain and swelling. Continue to use ice on the hip for pain and swelling from surgery. You may notice swelling that will progress down to the foot and ankle. This is normal after surgery. Elevate the leg when you are not up walking on it.   °Continue to use the breathing machine which will help keep your temperature down.  It is common for your temperature to cycle up and down following surgery, especially at night when you are not up moving around and exerting yourself.  The breathing machine keeps your lungs expanded and your temperature down. ° °DIET °You may resume your previous home diet once your are discharged from the hospital. ° °DRESSING / WOUND CARE / SHOWERING °You have an adhesive waterproof bandage over the incision. Leave this in place until your first follow-up appointment. Once you remove this you will not need to place another bandage.  °You may begin showering 3 days following surgery, but do not submerge the incision under water. ° °ACTIVITY °For the first 3-5 days, it is important to rest and keep the operative leg elevated.  You should, as a general rule, rest for 50 minutes and walk/stretch for 10 minutes per hour. After 5 days, you may slowly increase activity as tolerated.  °Perform the exercises you were provided twice a day for about 15-20 minutes each session. Begin these 2 days following surgery. °Walk with your walker as instructed. Use the walker until you are comfortable transitioning to a cane. Walk with the cane in the opposite hand of the operative leg. You may discontinue the cane once you are comfortable and walking steadily. °Avoid periods of inactivity such as sitting longer than an hour when not asleep. This helps prevent blood clots.  °Do not drive a car for 6 weeks or until released by your surgeon.  °Do not drive while taking narcotics. ° °TED HOSE STOCKINGS °Wear the elastic stockings on both legs for three weeks following surgery during the day. You may remove them at night while sleeping. ° °WEIGHT BEARING °Weight bearing as tolerated with assist device (walker, cane, etc) as directed, use it as long as suggested by your surgeon or therapist, typically at least 4-6 weeks. ° °POSTOPERATIVE CONSTIPATION PROTOCOL °Constipation - defined medically as fewer than three stools per week and severe constipation as less than one stool per week. ° °One of the most common issues patients have following surgery is constipation.  Even if you have a regular bowel pattern at home, your normal regimen is likely to be disrupted due to multiple reasons following surgery.  Combination of anesthesia, postoperative narcotics, change in appetite and fluid intake all can affect your bowels.    In order to avoid complications following surgery, here are some recommendations in order to help you during your recovery period. ° °Colace (docusate) - Pick up an over-the-counter form of Colace or another stool softener and take twice a day as long as you are requiring postoperative pain medications.  Take with a full glass of water daily.  If  you experience loose stools or diarrhea, hold the colace until you stool forms back up.  If your symptoms do not get better within 1 week or if they get worse, check with your doctor. °Dulcolax (bisacodyl) - Pick up over-the-counter and take as directed by the product packaging as needed to assist with the movement of your bowels.  Take with a full glass of water.  Use this product as needed if not relieved by Colace only.  °MiraLax (polyethylene glycol) - Pick up over-the-counter to have on hand.  MiraLax is a solution that will increase the amount of water in your bowels to assist with bowel movements.  Take as directed and can mix with a glass of water, juice, soda, coffee, or tea.  Take if you go more than two days without a movement.Do not use MiraLax more than once per day. Call your doctor if you are still constipated or irregular after using this medication for 7 days in a row. ° °If you continue to have problems with postoperative constipation, please contact the office for further assistance and recommendations.  If you experience "the worst abdominal pain ever" or develop nausea or vomiting, please contact the office immediatly for further recommendations for treatment. ° °ITCHING ° If you experience itching with your medications, try taking only a single pain pill, or even half a pain pill at a time.  You can also use Benadryl over the counter for itching or also to help with sleep.  ° °MEDICATIONS °See your medication summary on the “After Visit Summary” that the nursing staff will review with you prior to discharge.  You may have some home medications which will be placed on hold until you complete the course of blood thinner medication.  It is important for you to complete the blood thinner medication as prescribed by your surgeon.  Continue your approved medications as instructed at time of discharge. ° °PRECAUTIONS °If you experience chest pain or shortness of breath - call 911 immediately for  transfer to the hospital emergency department.  °If you develop a fever greater that 101 F, purulent drainage from wound, increased redness or drainage from wound, foul odor from the wound/dressing, or calf pain - CONTACT YOUR SURGEON.   °                                                °FOLLOW-UP APPOINTMENTS °Make sure you keep all of your appointments after your operation with your surgeon and caregivers. You should call the office at the above phone number and make an appointment for approximately two weeks after the date of your surgery or on the date instructed by your surgeon outlined in the "After Visit Summary". ° °RANGE OF MOTION AND STRENGTHENING EXERCISES  °These exercises are designed to help you keep full movement of your hip joint. Follow your caregiver's or physical therapist's instructions. Perform all exercises about fifteen times, three times per day or as directed. Exercise both hips, even if you have had only   one joint replacement. These exercises can be done on a training (exercise) mat, on the floor, on a table or on a bed. Use whatever works the best and is most comfortable for you. Use music or television while you are exercising so that the exercises are a pleasant break in your day. This will make your life better with the exercises acting as a break in routine you can look forward to.  °Lying on your back, slowly slide your foot toward your buttocks, raising your knee up off the floor. Then slowly slide your foot back down until your leg is straight again.  °Lying on your back spread your legs as far apart as you can without causing discomfort.  °Lying on your side, raise your upper leg and foot straight up from the floor as far as is comfortable. Slowly lower the leg and repeat.  °Lying on your back, tighten up the muscle in the front of your thigh (quadriceps muscles). You can do this by keeping your leg straight and trying to raise your heel off the floor. This helps strengthen the  largest muscle supporting your knee.  °Lying on your back, tighten up the muscles of your buttocks both with the legs straight and with the knee bent at a comfortable angle while keeping your heel on the floor.  ° °POST-OPERATIVE OPIOID TAPER INSTRUCTIONS: °It is important to wean off of your opioid medication as soon as possible. If you do not need pain medication after your surgery it is ok to stop day one. °Opioids include: °Codeine, Hydrocodone(Norco, Vicodin), Oxycodone(Percocet, oxycontin) and hydromorphone amongst others.  °Long term and even short term use of opiods can cause: °Increased pain response °Dependence °Constipation °Depression °Respiratory depression °And more.  °Withdrawal symptoms can include °Flu like symptoms °Nausea, vomiting °And more °Techniques to manage these symptoms °Hydrate well °Eat regular healthy meals °Stay active °Use relaxation techniques(deep breathing, meditating, yoga) °Do Not substitute Alcohol to help with tapering °If you have been on opioids for less than two weeks and do not have pain than it is ok to stop all together.  °Plan to wean off of opioids °This plan should start within one week post op of your joint replacement. °Maintain the same interval or time between taking each dose and first decrease the dose.  °Cut the total daily intake of opioids by one tablet each day °Next start to increase the time between doses. °The last dose that should be eliminated is the evening dose.  ° °IF YOU ARE TRANSFERRED TO A SKILLED REHAB FACILITY °If the patient is transferred to a skilled rehab facility following release from the hospital, a list of the current medications will be sent to the facility for the patient to continue.  When discharged from the skilled rehab facility, please have the facility set up the patient's Home Health Physical Therapy prior to being released. Also, the skilled facility will be responsible for providing the patient with their medications at time of  release from the facility to include their pain medication, the muscle relaxants, and their blood thinner medication. If the patient is still at the rehab facility at time of the two week follow up appointment, the skilled rehab facility will also need to assist the patient in arranging follow up appointment in our office and any transportation needs. ° °MAKE SURE YOU:  °Understand these instructions.  °Get help right away if you are not doing well or get worse.  ° ° °DENTAL ANTIBIOTICS: ° °In most   cases prophylactic antibiotics for Dental procdeures after total joint surgery are not necessary. ° °Exceptions are as follows: ° °1. History of prior total joint infection ° °2. Severely immunocompromised (Organ Transplant, cancer chemotherapy, Rheumatoid biologic °meds such as Humera) ° °3. Poorly controlled diabetes (A1C &gt; 8.0, blood glucose over 200) ° °If you have one of these conditions, contact your surgeon for an antibiotic prescription, prior to your °dental procedure.  ° ° °Pick up stool softner and laxative for home use following surgery while on pain medications. °Do not submerge incision under water. °Please use good hand washing techniques while changing dressing each day. °May shower starting three days after surgery. °Please use a clean towel to pat the incision dry following showers. °Continue to use ice for pain and swelling after surgery. °Do not use any lotions or creams on the incision until instructed by your surgeon. ° °

## 2020-09-02 NOTE — Interval H&P Note (Signed)
History and Physical Interval Note:  09/02/2020 8:17 AM  [name]  has presented today for surgery, with the diagnosis of Right hip osteoarthritis.  The various methods of treatment have been discussed with the patient and family. After consideration of risks, benefits and other options for treatment, the patient has consented to  Procedure(s): TOTAL HIP ARTHROPLASTY ANTERIOR APPROACH (Right) as a surgical intervention.  The patient's history has been reviewed, patient examined, no change in status, stable for surgery.  I have reviewed the patient's chart and labs.  Questions were answered to the patient's satisfaction.     [name]

## 2020-09-02 NOTE — Plan of Care (Signed)
?  Problem: Activity: ?Goal: Risk for activity intolerance will decrease ?Outcome: Progressing ?  ?Problem: Pain Managment: ?Goal: General experience of comfort will improve ?Outcome: Progressing ?  ?Problem: Nutrition: ?Goal: Adequate nutrition will be maintained ?Outcome: Progressing ?  ?

## 2020-09-02 NOTE — Anesthesia Postprocedure Evaluation (Signed)
Anesthesia Post Note  Patient: [name]  Procedure(s) Performed: TOTAL HIP ARTHROPLASTY ANTERIOR APPROACH (Right: Hip)     Patient location during evaluation: PACU Anesthesia Type: Spinal Level of consciousness: awake and alert Pain management: pain level controlled Vital Signs Assessment: post-procedure vital signs reviewed and stable Respiratory status: spontaneous breathing and respiratory function stable Cardiovascular status: blood pressure returned to baseline and stable Postop Assessment: spinal receding Anesthetic complications: no   No notable events documented.  Last Vitals:  Vitals:   09/02/20 1441 09/02/20 1648  BP: (!) 155/80 117/60  Pulse: 68 69  Resp: 16 17  Temp: 36.7 C 36.5 C  SpO2:  99%    Last Pain:  Vitals:   09/02/20 1810  TempSrc:   PainSc: [name]

## 2020-09-02 NOTE — Transfer of Care (Signed)
Immediate Anesthesia Transfer of Care Note  Patient: [name]  Procedure(s) Performed: Procedure(s) (LRB): TOTAL HIP ARTHROPLASTY ANTERIOR APPROACH (Right)  Patient Location: PACU  Anesthesia Type: Spinal  Level of Consciousness: awake, drowsy , oriented and patient cooperative  Airway & Oxygen Therapy: Patient Spontanous Breathing and Patient connected to face mask oxygen  Post-op Assessment: Report given to PACU RN and Post -op Vital signs reviewed and stable  Post vital signs: Reviewed and stable  Complications: No apparent anesthesia complications Last Vitals:  Vitals Value Taken Time  BP 132/64 09/02/20 1145  Temp    Pulse 65 09/02/20 1149  Resp 11 09/02/20 1149  SpO2 100 % 09/02/20 1149  Vitals shown include unvalidated device data.  Last Pain:  Vitals:   09/02/20 0824  TempSrc:   PainSc: 0-No pain      Patients Stated Pain Goal: 3 ([id number])  Complications: No notable events documented.

## 2020-09-02 NOTE — Evaluation (Signed)
Physical Therapy Evaluation Patient Details Name: [name] MRN: [id number] DOB: 03-01-1940 Today's Date: 09/02/2020   History of Present Illness  Patient is 80 y.o. male s/p Rt THA anterior approach on 09/02/20 with PMH significant for OA, CAD, AAA s/p repair. HLD, HTN, PAF, PVD, SBO, Stroke, CABG in 2017.    Clinical Impression  [name] is a 80 y.o. male POD 0 s/p Rt THA. Patient reports independence with mobility at baseline. Patient is now limited by functional impairments (see PT problem list below) and requires min assist for transfers and gait with RW. Patient was able to ambulate ~25 feet with RW and min assist but was limited by dizziness/lightheadedness/diaphoresis and required seated rest break where symptoms resolved. Patient instructed in exercise to facilitate circulation to manage edema and reduce risk of DVT. Patient will benefit from continued skilled PT interventions to address impairments and progress towards PLOF. Acute PT will follow to progress mobility and stair training in preparation for safe discharge home.     Follow Up Recommendations Follow surgeon's recommendation for DC plan and follow-up therapies    Equipment Recommendations  None recommended by PT    Recommendations for Other Services       Precautions / Restrictions Precautions Precautions: Fall Restrictions Weight Bearing Restrictions: No      Mobility  Bed Mobility Overal bed mobility: Needs Assistance Bed Mobility: Supine to Sit     Supine to sit: Min assist;HOB elevated     General bed mobility comments: cues to pivot with use of bed rail and assist to bring Rt LE off EOB. Assist with bed pad to scoot hips anteriorly on bed.    Transfers Overall transfer level: Needs assistance Equipment used: Rolling walker (2 wheeled) Transfers: Sit to/from Stand Sit to Stand: Min assist;From elevated surface         General transfer comment: cues for hand placement/technique  for power up. bed elevated slightly, assist to complete and steady with rise.  Ambulation/Gait Ambulation/Gait assistance: Min assist Gait Distance (Feet): 25 Feet Assistive device: Rolling walker (2 wheeled) Gait Pattern/deviations: Step-to pattern;Decreased stride length;Decreased weight shift to right Gait velocity: decr   General Gait Details: cues for step pattern and walker position, assist to manage. pt c/o slight dizziness and then became diaphoretic. seated rest required and symptoms improved in sitting.  Stairs            Wheelchair Mobility    Modified Rankin (Stroke Patients Only)       Balance Overall balance assessment: Needs assistance Sitting-balance support: Feet supported Sitting balance-Leahy Scale: Good     Standing balance support: During functional activity;Bilateral upper extremity supported Standing balance-Leahy Scale: Poor                               Pertinent Vitals/Pain Pain Assessment: Faces Faces Pain Scale: Hurts even more Pain Location: Rt hip Pain Descriptors / Indicators: Burning;Aching Pain Intervention(s): Limited activity within patient's tolerance;Monitored during session;Premedicated before session;Repositioned;Ice applied    Home Living Family/patient expects to be discharged to:: Private residence Living Arrangements: Spouse/significant other Available Help at Discharge: Family Type of Home: House       Home Layout: Multi-level;Full bath on main level;Able to live on main level with bedroom/bathroom Home Equipment: Bedside commode;Walker - 2 wheels;Cane - single point;Shower seat - built in;Grab bars - tub/shower      Prior Function Level of Independence: Independent  Hand Dominance   Dominant Hand: Right    Extremity/Trunk Assessment   Upper Extremity Assessment Upper Extremity Assessment: Overall WFL for tasks assessed    Lower Extremity Assessment Lower Extremity  Assessment: Overall WFL for tasks assessed    Cervical / Trunk Assessment Cervical / Trunk Assessment: Normal  Communication   Communication: No difficulties  Cognition Arousal/Alertness: Awake/alert Behavior During Therapy: WFL for tasks assessed/performed Overall Cognitive Status: Within Functional Limits for tasks assessed                                        General Comments      Exercises Total Joint Exercises Ankle Circles/Pumps: AROM;Both;20 reps;Seated   Assessment/Plan    PT Assessment Patient needs continued PT services  PT Problem List Decreased strength;Decreased range of motion;Decreased activity tolerance;Decreased balance;Decreased mobility;Decreased knowledge of use of DME;Decreased knowledge of precautions;Pain       PT Treatment Interventions DME instruction;Gait training;Stair training;Functional mobility training;Therapeutic activities;Therapeutic exercise;Balance training;Patient/family education    PT Goals (Current goals can be found in the Care Plan section)  Acute Rehab PT Goals Patient Stated Goal: get back independence to keep working in yard and golfing PT Goal Formulation: With patient Time For Goal Achievement: 09/09/20 Potential to Achieve Goals: Good    Frequency 7X/week   Barriers to discharge        Co-evaluation               AM-PAC PT "6 Clicks" Mobility  Outcome Measure Help needed turning from your back to your side while in a flat bed without using bedrails?: A Little Help needed moving from lying on your back to sitting on the side of a flat bed without using bedrails?: A Little Help needed moving to and from a bed to a chair (including a wheelchair)?: A Little Help needed standing up from a chair using your arms (e.g., wheelchair or bedside chair)?: A Little Help needed to walk in hospital room?: A Little Help needed climbing 3-5 steps with a railing? : A Little 6 Click Score: 18    End of Session  Equipment Utilized During Treatment: Gait belt Activity Tolerance: Patient tolerated treatment well;Treatment limited secondary to medical complications (Comment) (dizziness, diaphoresis while ambuating (suspect hypotesion)) Patient left: in chair;with call bell/phone within reach;with family/visitor present Nurse Communication: Mobility status PT Visit Diagnosis: Muscle weakness (generalized) (M62.81);Difficulty in walking, not elsewhere classified (R26.2)    Time: [id number] PT Time Calculation (min) (ACUTE ONLY): 22 min   Charges:   PT Evaluation $PT Eval Low Complexity: 1 Low          [name], DPT Acute Rehabilitation Services Office [phone] Pager [phone]   Jacques Navy 09/02/2020, 5:03 PM

## 2020-09-02 NOTE — Anesthesia Procedure Notes (Signed)
Spinal  Patient location during procedure: OR Start time: 09/02/2020 9:45 AM End time: 09/02/2020 9:50 AM Reason for block: surgical anesthesia Staffing Performed: anesthesiologist  Anesthesiologist: Suzette Battiest, MD Preanesthetic Checklist Completed: patient identified, IV checked, site marked, risks and benefits discussed, surgical consent, monitors and equipment checked, pre-op evaluation and timeout performed Spinal Block Patient position: sitting Prep: DuraPrep Patient monitoring: heart rate, cardiac monitor, continuous pulse ox and blood pressure Approach: midline Location: L3-4 Injection technique: single-shot Needle Needle type: Pencan  Needle gauge: 24 G Needle length: 9 cm Assessment Sensory level: T4 Events: CSF return

## 2020-09-02 NOTE — Op Note (Signed)
OPERATIVE REPORT- TOTAL HIP ARTHROPLASTY   PREOPERATIVE DIAGNOSIS: Osteoarthritis of the Right hip.   POSTOPERATIVE DIAGNOSIS: Osteoarthritis of the Right  hip.   PROCEDURE: Right total hip arthroplasty, anterior approach.   SURGEON: Gaynelle Arabian, MD   ASSISTANT: Theresa Duty, PA-C  ANESTHESIA:  Spinal  ESTIMATED BLOOD LOSS:-650 mL    DRAINS: Hemovac x1.   COMPLICATIONS: None   CONDITION: PACU - hemodynamically stable.   BRIEF CLINICAL NOTE: [name] is a 80 y.o. male who has advanced end-  stage arthritis of their Right  hip with progressively worsening pain and  dysfunction.The patient has failed nonoperative management and presents for  total hip arthroplasty.   PROCEDURE IN DETAIL: After successful administration of spinal  anesthetic, the traction boots for the [facility] bed were placed on both  feet and the patient was placed onto the [facility] bed, boots placed into the leg  holders. The Right hip was then isolated from the perineum with plastic  drapes and prepped and draped in the usual sterile fashion. ASIS and  greater trochanter were marked and a oblique incision was made, starting  at about 1 cm lateral and 2 cm distal to the ASIS and coursing towards  the anterior cortex of the femur. The skin was cut with a 10 blade  through subcutaneous tissue to the level of the fascia overlying the  tensor fascia lata muscle. The fascia was then incised in line with the  incision at the junction of the anterior third and posterior 2/3rd. The  muscle was teased off the fascia and then the interval between the TFL  and the rectus was developed. The Hohmann retractor was then placed at  the top of the femoral neck over the capsule. The vessels overlying the  capsule were cauterized and the fat on top of the capsule was removed.  A Hohmann retractor was then placed anterior underneath the rectus  femoris to give exposure to the entire anterior capsule. A T-shaped   capsulotomy was performed. The edges were tagged and the femoral head  was identified.       Osteophytes are removed off the superior acetabulum.  The femoral neck was then cut in situ with an oscillating saw. Traction  was then applied to the left lower extremity utilizing the [facility]  traction. The femoral head was then removed. Retractors were placed  around the acetabulum and then circumferential removal of the labrum was  performed. Osteophytes were also removed. Reaming starts at 51 mm to  medialize and  Increased in 2 mm increments to 55 mm. We reamed in  approximately 40 degrees of abduction, 20 degrees anteversion. A 56 mm  pinnacle acetabular shell was then impacted in anatomic position under  fluoroscopic guidance with excellent purchase. We did not need to place  any additional dome screws. A 36 mm neutral + 4 marathon liner was then  placed into the acetabular shell.       The femoral lift was then placed along the lateral aspect of the femur  just distal to the vastus ridge. The leg was  externally rotated and capsule  was stripped off the inferior aspect of the femoral neck down to the  level of the lesser trochanter, this was done with electrocautery. The femur was lifted after this was performed. The  leg was then placed in an extended and adducted position essentially delivering the femur. We also removed the capsule superiorly and the piriformis from the piriformis  fossa to gain excellent exposure of the  proximal femur. Rongeur was used to remove some cancellous bone to get  into the lateral portion of the proximal femur for placement of the  initial starter reamer. The starter broaches was placed  the starter broach  and was shown to go down the center of the canal. Broaching  with the Actis system was then performed starting at size 0  coursing  Up to size 8. A size 8 had excellent torsional and rotational  and axial stability. The trial standard offset neck was then  placed  with a 36 + 5 trial head. The hip was then reduced. We confirmed that  the stem was in the canal both on AP and lateral x-rays. It also has excellent sizing. The hip was reduced with outstanding stability through full extension and full external rotation.. AP pelvis was taken and the leg lengths were measured and found to be equal. Hip was then dislocated again and the femoral head and neck removed. The  femoral broach was removed. Size 8 Actis stem with a standard offset  neck was then impacted into the femur following native anteversion. Has  excellent purchase in the canal. Excellent torsional and rotational and  axial stability. It is confirmed to be in the canal on AP and lateral  fluoroscopic views. The 36 + 5 ceramic head was placed and the hip  reduced with outstanding stability. Again AP pelvis was taken and it  confirmed that the leg lengths were equal. The wound was then copiously  irrigated with saline solution and the capsule reattached and repaired  with Ethibond suture. 30 ml of .25% Bupivicaine was  injected into the capsule and into the edge of the tensor fascia lata as well as subcutaneous tissue. The fascia overlying the tensor fascia lata was then closed with a running #1 V-Loc. Subcu was closed with interrupted 2-0 Vicryl and subcuticular running 4-0 Monocryl. Incision was cleaned  and dried. Steri-Strips and a bulky sterile dressing applied. The patient was awakened and transported to  recovery in stable condition.        Please note that a surgical assistant was a medical necessity for this procedure to perform it in a safe and expeditious manner. Assistant was necessary to provide appropriate retraction of vital neurovascular structures and to prevent femoral fracture and allow for anatomic placement of the prosthesis.  Gaynelle Arabian, M.D.

## 2020-09-03 ENCOUNTER — Encounter (HOSPITAL_COMMUNITY): Payer: Self-pay | Admitting: Orthopedic Surgery

## 2020-09-03 DIAGNOSIS — M1611 Unilateral primary osteoarthritis, right hip: Secondary | ICD-10-CM | POA: Diagnosis not present

## 2020-09-03 LAB — BASIC METABOLIC PANEL
Anion gap: 7 (ref 5–15)
BUN: 17 mg/dL (ref 8–23)
CO2: 26 mmol/L (ref 22–32)
Calcium: 8.8 mg/dL — ABNORMAL LOW (ref 8.9–10.3)
Chloride: 106 mmol/L (ref 98–111)
Creatinine, Ser: 1.2 mg/dL (ref 0.61–1.24)
GFR, Estimated: >60 mL/min (ref >60)
Glucose, Bld: 139 mg/dL — ABNORMAL HIGH (ref 70–99)
Potassium: 4.4 mmol/L (ref 3.5–5.1)
Sodium: 139 mmol/L (ref 135–145)

## 2020-09-03 LAB — CBC
HCT: 31 % — ABNORMAL LOW (ref 39.0–52.0)
Hemoglobin: 10.1 g/dL — ABNORMAL LOW (ref 13.0–17.0)
MCH: 31.9 pg (ref 26.0–34.0)
MCHC: 32.6 g/dL (ref 30.0–36.0)
MCV: 97.8 fL (ref 80.0–100.0)
Platelets: 124 10*3/uL — ABNORMAL LOW (ref 150–400)
RBC: 3.17 MIL/uL — ABNORMAL LOW (ref 4.22–5.81)
RDW: 13.2 % (ref 11.5–15.5)
WBC: 10.5 10*3/uL (ref 4.0–10.5)
nRBC: 0 % (ref 0.0–0.2)

## 2020-09-03 MED ORDER — SODIUM CHLORIDE 0.9 % IV BOLUS
250.0000 mL | Freq: Once | INTRAVENOUS | Status: AC
  Administered 2020-09-03: 250 mL via INTRAVENOUS

## 2020-09-03 MED ORDER — TRAMADOL HCL 50 MG PO TABS: 50.0000 mg | ORAL_TABLET | Freq: Four times a day (QID) | ORAL | 0 refills | Status: DC | PRN

## 2020-09-03 MED ORDER — SODIUM CHLORIDE 0.9 % IV BOLUS
500.0000 mL | Freq: Once | INTRAVENOUS | Status: AC
  Administered 2020-09-03: 500 mL via INTRAVENOUS

## 2020-09-03 MED ORDER — HYDROCODONE-ACETAMINOPHEN 5-325 MG PO TABS: 1.0000 | ORAL_TABLET | ORAL | 0 refills | Status: DC | PRN

## 2020-09-03 MED ORDER — METHOCARBAMOL 500 MG PO TABS: 500.0000 mg | ORAL_TABLET | Freq: Four times a day (QID) | ORAL | 0 refills | Status: DC | PRN

## 2020-09-03 NOTE — Plan of Care (Signed)
  Problem: Pain Managment: Goal: General experience of comfort will improve Outcome: Progressing   Problem: Safety: Goal: Ability to remain free from injury will improve Outcome: Progressing   

## 2020-09-03 NOTE — TOC Transition Note (Signed)
Transition of Care [facility]) - CM/SW Discharge Note  Patient Details  Name: [name] MRN: [id number] Date of Birth: 03-Jun-1940  Transition of Care [facility]) CM/SW Contact:  Sherie Don, LCSW Phone Number: 09/03/2020, 10:26 AM  Clinical Narrative: Patient is expected to discharge home after working with PT. CSW met with patient to review discharge plan. Patient will discharge home with a home exercise program (HEP). Patient has a rolling walker, 3N1, and grab bars by the toilets and in the shower at home, so there are no DME needs at this time. TOC signing off.  Final next level of care: Home/Self Care Barriers to Discharge: No Barriers Identified  Patient Goals and CMS Choice Patient states their goals for this hospitalization and ongoing recovery are:: Discharge home with HEP CMS Medicare.gov Compare Post Acute Care list provided to:: Patient Choice offered to / list presented to : Patient  Discharge Plan and Services        DME Arranged: N/A DME Agency: NA HH Arranged: NA [place] Agency: NA  Readmission Risk Interventions No flowsheet data found.

## 2020-09-03 NOTE — Care Plan (Signed)
Ortho Bundle Case Management Note  Patient Details  Name: [name] MRN: [id number] Date of Birth: [birth year]  R THA on 09-02-20 DCP:  Home with wife.  1 story home with 3 ste. DME:  No needs.  Has a RW and 3-in-1 PT:  HEP                   DME Arranged:  N/A DME Agency:  NA  HH Arranged:  NA HH Agency:  NA  Additional Comments: Please contact me with any questions of if this plan should need to change.  Marianne Sofia, RN,CCM [facility]  [phone] 09/03/2020, 8:04 AM

## 2020-09-03 NOTE — Progress Notes (Signed)
Physical Therapy Treatment Patient Details Name: [name] MRN: [id number] DOB: Nov 16, 1940 Today's Date: 09/03/2020    History of Present Illness Patient is 80 y.o. male s/p Rt THA anterior approach on 09/02/20 with PMH significant for OA, CAD, AAA s/p repair. HLD, HTN, PAF, PVD, SBO, Stroke, CABG in 2017.    PT Comments    Patient making much improved progress with mobility and demonstrated greater tolerance to activity. Patient demonstrated good carryover for safe technique with walker during transfers and gait. Pt progressed to step through pattern with RW and VSS (see below). He will benefit from additional skilled PT interventions to progress mobility as able in preparation for safe discharge home.  Vital Signs   Pulse Rate 75 95 96  Pulse Rate Source Dinamap Dinamap Dinamap  BP 134/58 133/67 132/74  BP Location Right Arm Right Arm Right Arm  BP Method Automatic Automatic Automatic  Patient Position (if appropriate) Sitting Standing (after walking 90') Sitting (after walking 180')        Follow Up Recommendations  Follow surgeon's recommendation for DC plan and follow-up therapies     Equipment Recommendations  None recommended by PT    Recommendations for Other Services       Precautions / Restrictions Precautions Precautions: Fall Restrictions Weight Bearing Restrictions: No RLE Weight Bearing: Weight bearing as tolerated    Mobility  Bed Mobility Overal bed mobility: Needs Assistance Bed Mobility: Sit to Supine       Sit to supine: Min assist;HOB elevated   General bed mobility comments: cues to use Lt LE to assist Rt LE back onto bed, pt unable to complete and  min assist required to bring LE back onto bed.    Transfers Overall transfer level: Needs assistance Equipment used: Rolling walker (2 wheeled) Transfers: Sit to/from Stand Sit to Stand: From elevated surface;Min guard         General transfer comment: pt with good carryover for  safe hand placement, bil UE use for power up from recliner.  Ambulation/Gait Ambulation/Gait assistance: Min assist;Min guard Gait Distance (Feet): 160 Feet (standing rest break halfway) Assistive device: Rolling walker (2 wheeled) Gait Pattern/deviations: Step-to pattern;Decreased stride length;Decreased weight shift to right Gait velocity: decr   General Gait Details: pt much improved tolerance to ambulation, no diaphoresis observed. pt ambulated ~80' then took standing rest break, VSS and ambulated back. Pt prgressed to step through pattern, noted slight buckling of Rt LE 1x during gait.   Stairs             Wheelchair Mobility    Modified Rankin (Stroke Patients Only)       Balance Overall balance assessment: Needs assistance Sitting-balance support: Feet supported Sitting balance-Leahy Scale: Good     Standing balance support: During functional activity;Bilateral upper extremity supported Standing balance-Leahy Scale: Poor                              Cognition Arousal/Alertness: Awake/alert Behavior During Therapy: WFL for tasks assessed/performed Overall Cognitive Status: Within Functional Limits for tasks assessed                                        Exercises      General Comments        Pertinent Vitals/Pain Pain Assessment: No/denies pain Pain Score: 0-No pain Pain Intervention(s): Limited  activity within patient's tolerance;Monitored during session    Home Living                      Prior Function            PT Goals (current goals can now be found in the care plan section) Acute Rehab PT Goals Patient Stated Goal: get back independence to keep working in yard and golfing PT Goal Formulation: With patient Time For Goal Achievement: 09/09/20 Potential to Achieve Goals: Good Progress towards PT goals: Progressing toward goals    Frequency    7X/week      PT Plan Current plan remains  appropriate    Co-evaluation              AM-PAC PT "6 Clicks" Mobility   Outcome Measure  Help needed turning from your back to your side while in a flat bed without using bedrails?: A Little Help needed moving from lying on your back to sitting on the side of a flat bed without using bedrails?: A Little Help needed moving to and from a bed to a chair (including a wheelchair)?: A Little Help needed standing up from a chair using your arms (e.g., wheelchair or bedside chair)?: A Little Help needed to walk in hospital room?: A Little Help needed climbing 3-5 steps with a railing? : A Lot 6 Click Score: 17    End of Session Equipment Utilized During Treatment: Gait belt Activity Tolerance: Patient tolerated treatment well;Treatment limited secondary to medical complications (Comment) (diaphoresis while ambuation (hypotension and tachycardia)) Patient left: in chair;with call bell/phone within reach;with family/visitor present Nurse Communication: Mobility status PT Visit Diagnosis: Muscle weakness (generalized) (M62.81);Difficulty in walking, not elsewhere classified (R26.2)     Time: [id number] PT Time Calculation (min) (ACUTE ONLY): 29 min  Charges:  $Gait Training: 23-37 mins                     [name], DPT Acute Rehabilitation Services Office [phone] Pager [phone]    Jacques Navy 09/03/2020, 6:16 PM

## 2020-09-03 NOTE — Progress Notes (Signed)
Subjective: 1 Day Post-Op Procedure(s) (LRB): TOTAL HIP ARTHROPLASTY ANTERIOR APPROACH (Right) Patient reports pain as mild.   Patient seen in rounds with Dr. Wynelle Link. Patient is well, and has had no acute complaints or problems other than pain in the right hip. Denies chest pain or SOB. Foley catheter removed this AM. No issues overnight.  We will continue therapy today, ambulated 25' yesterday.   Objective: Vital signs in last 24 hours: Temp:  [96.3 F (35.7 C)-98.2 F (36.8 C)] 98.2 F (36.8 C) (08/18 0416) Pulse Rate:  [61-70] 70 (08/18 0416) Resp:  [9-27] 16 (08/18 0416) BP: (110-177)/(53-95) 110/53 (08/18 0416) SpO2:  [98 %-100 %] 99 % (08/18 0416) Weight:  [82.6 kg] 82.6 kg (08/17 0824)  Intake/Output from previous day:  Intake/Output Summary (Last 24 hours) at 09/03/2020 0759 Last data filed at 09/03/2020 0600 Gross per 24 hour  Intake 3927.19 ml  Output 3075 ml  Net 852.19 ml     Intake/Output this shift: No intake/output data recorded.  Labs: Recent Labs    09/03/20 0348  HGB 10.1*   Recent Labs    09/03/20 0348  WBC 10.5  RBC 3.17*  HCT 31.0*  PLT 124*   Recent Labs    09/03/20 0348  NA 139  K 4.4  CL 106  CO2 26  BUN 17  CREATININE 1.20  GLUCOSE 139*  CALCIUM 8.8*   No results for input(s): LABPT, INR in the last 72 hours.  Exam: General - Patient is Alert and Oriented Extremity - Neurologically intact Neurovascular intact Sensation intact distally Dorsiflexion/Plantar flexion intact Dressing - dressing C/D/I Motor Function - intact, moving foot and toes well on exam.   Past Medical History:  Diagnosis Date   Arthritis    HANDS AND FEET   Asymptomatic carotid artery stenosis BILATERAL   PER DOPPLER  03/2016  RICA  1 - 39%/  s/p  L CEA  1-39%- followed by Dr. Donnetta Hutching   CAD (coronary artery disease)    s/p remote stenting, s/p CABG with LIMA to LAD, SVG to RCA and SVG to LCx 12/2015   Cataract immature BILATERAL    Diverticulosis     History of AAA (abdominal aortic aneurysm) repair 2000   W/ AORTOVIFEMORAL BYPASS   History of diverticulosis    Noted on Colonoscopy   History of inguinal herniorrhaphy    Hydrocele, left    Hyperlipidemia    Hypertension    Increased intraocular pressure    Lumbar degenerative disc disease 03/2017   Nocturia    Paroxysmal atrial fibrillation (HCC) 06/30/2014   chad2vasc score is at least 4   Popliteal artery aneurysm (HCC) LEFT   PVD (peripheral vascular disease) (HCC)    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   Weight as of this encounter: 82.6 kg. Advance diet Up with therapy  DVT Prophylaxis -  Eliquis Weight bearing as tolerated. Continue therapy.  BP soft this AM, 250 mL bolus ordered.   Plan is to go Home after hospital stay. Plan for discharge to home later today if  progresses with therapy and meeting his goals. HEP Follow-up in the office in 2 weeks  The PDMP database was reviewed today prior to any opioid medications being prescribed to this patient.  Theresa Duty, PA-C Orthopedic Surgery [phone] 09/03/2020, 7:59 AM

## 2020-09-03 NOTE — Progress Notes (Signed)
Physical Therapy Treatment Patient Details Name: [name] MRN: [id number] DOB: Jul 12, 1940 Today's Date: 09/03/2020    History of Present Illness Patient is 80 y.o. male s/p Rt THA anterior approach on 09/02/20 with PMH significant for OA, CAD, AAA s/p repair. HLD, HTN, PAF, PVD, SBO, Stroke, CABG in 2017.    PT Comments    Patient progressing mobility but remains limited by diaphoresis and hypotension. Pt required min assist for bed mobility, transfers, and gait and ambulated ~80'. He became very diaphoretic and hypotensive/tachycardic (see below details). RN/MD notified. Acute PT will continue to progress pt as able.     Significant Vitals: Following gait BP dropped to 89/54 and HR was at 130 bpm while standing. With sitting HR continued to fluctuate from 120's -155 bpm max. Pt denied dizziness, SOB, CP, lightheaded sensation. He just felt warm and was sweating. BP recovered to 130/71 after sitting ~4 minutes and HR back to 80's.    09/03/20 1050 09/03/20 1056  Vital Signs  Pulse Rate (!) 130 (jumped up to 155 bpm max) 82  Pulse Rate Source Dinamap Dinamap  BP (!) 89/54 130/71  BP Location Right Arm Right Arm  BP Method Automatic Automatic  Patient Position (if appropriate) Standing Sitting     Follow Up Recommendations  Follow surgeon's recommendation for DC plan and follow-up therapies     Equipment Recommendations  None recommended by PT    Recommendations for Other Services       Precautions / Restrictions Precautions Precautions: Fall Restrictions Weight Bearing Restrictions: No RLE Weight Bearing: Weight bearing as tolerated    Mobility  Bed Mobility Overal bed mobility: Needs Assistance Bed Mobility: Supine to Sit     Supine to sit: Min assist;HOB elevated     General bed mobility comments: cues for use of gait belt to assist Rt LE off EOB, assist required to fully raise trunk and pivot/scoot to EOB.    Transfers Overall transfer level:  Needs assistance Equipment used: Rolling walker (2 wheeled) Transfers: Sit to/from Stand Sit to Stand: From elevated surface;Min guard         General transfer comment: cues for hand placement, min guard for rise from elevated EOB and extra time/effort by patient.  Ambulation/Gait Ambulation/Gait assistance: Min assist Gait Distance (Feet): 80 Feet Assistive device: Rolling walker (2 wheeled) Gait Pattern/deviations: Step-to pattern;Decreased stride length;Decreased weight shift to right Gait velocity: decr   General Gait Details: pt denied dizziness with gait this session. pt required min assist and cues to maintain safe proximity to RW and step pattern. Pt became diaphoretic during gait but denied dizziness, lightheadedness, SOB, CP, nausea. Pt hypotension and tachycardic (see vitals in note).   Stairs             Wheelchair Mobility    Modified Rankin (Stroke Patients Only)       Balance Overall balance assessment: Needs assistance Sitting-balance support: Feet supported Sitting balance-Leahy Scale: Good     Standing balance support: During functional activity;Bilateral upper extremity supported Standing balance-Leahy Scale: Poor                              Cognition Arousal/Alertness: Awake/alert Behavior During Therapy: WFL for tasks assessed/performed Overall Cognitive Status: Within Functional Limits for tasks assessed  Exercises Total Joint Exercises Ankle Circles/Pumps: AROM;Both;20 reps;Seated    General Comments        Pertinent Vitals/Pain Pain Assessment: Faces Pain Score: 2  Pain Location: Rt hip Pain Descriptors / Indicators: Burning;Aching Pain Intervention(s): Limited activity within patient's tolerance;Monitored during session;Repositioned    Home Living                      Prior Function            PT Goals (current goals can now be found in the  care plan section) Acute Rehab PT Goals Patient Stated Goal: get back independence to keep working in yard and golfing PT Goal Formulation: With patient Time For Goal Achievement: 09/09/20 Potential to Achieve Goals: Good Progress towards PT goals: Progressing toward goals    Frequency    7X/week      PT Plan Current plan remains appropriate    Co-evaluation              AM-PAC PT "6 Clicks" Mobility   Outcome Measure  Help needed turning from your back to your side while in a flat bed without using bedrails?: A Little Help needed moving from lying on your back to sitting on the side of a flat bed without using bedrails?: A Little Help needed moving to and from a bed to a chair (including a wheelchair)?: A Little Help needed standing up from a chair using your arms (e.g., wheelchair or bedside chair)?: A Little Help needed to walk in hospital room?: A Little Help needed climbing 3-5 steps with a railing? : A Lot 6 Click Score: 17    End of Session Equipment Utilized During Treatment: Gait belt Activity Tolerance: Patient tolerated treatment well;Treatment limited secondary to medical complications (Comment) (diaphoresis while ambuation (hypotension and tachycardia)) Patient left: in chair;with call bell/phone within reach;with family/visitor present Nurse Communication: Mobility status PT Visit Diagnosis: Muscle weakness (generalized) (M62.81);Difficulty in walking, not elsewhere classified (R26.2)     Time: [id number] PT Time Calculation (min) (ACUTE ONLY): 29 min  Charges:  $Gait Training: 8-22 mins $Therapeutic Activity: 8-22 mins                     [name], DPT Acute Rehabilitation Services Office [phone] Pager [phone]    Jacques Navy 09/03/2020, 12:22 PM

## 2020-09-03 NOTE — Plan of Care (Signed)
  Problem: Activity: Goal: Risk for activity intolerance will decrease Outcome: Progressing   Problem: Elimination: Goal: Will not experience complications related to bowel motility Outcome: Progressing   Problem: Pain Managment: Goal: General experience of comfort will improve Outcome: Progressing   Problem: Safety: Goal: Ability to remain free from injury will improve Outcome: Progressing   

## 2020-09-04 DIAGNOSIS — M1611 Unilateral primary osteoarthritis, right hip: Secondary | ICD-10-CM | POA: Diagnosis not present

## 2020-09-04 LAB — BASIC METABOLIC PANEL
Anion gap: 4 — ABNORMAL LOW (ref 5–15)
BUN: 16 mg/dL (ref 8–23)
CO2: 24 mmol/L (ref 22–32)
Calcium: 8.8 mg/dL — ABNORMAL LOW (ref 8.9–10.3)
Chloride: 112 mmol/L — ABNORMAL HIGH (ref 98–111)
Creatinine, Ser: 0.94 mg/dL (ref 0.61–1.24)
GFR, Estimated: >60 mL/min (ref >60)
Glucose, Bld: 127 mg/dL — ABNORMAL HIGH (ref 70–99)
Potassium: 3.9 mmol/L (ref 3.5–5.1)
Sodium: 140 mmol/L (ref 135–145)

## 2020-09-04 LAB — CBC
HCT: 27.8 % — ABNORMAL LOW (ref 39.0–52.0)
Hemoglobin: 9.1 g/dL — ABNORMAL LOW (ref 13.0–17.0)
MCH: 31.9 pg (ref 26.0–34.0)
MCHC: 32.7 g/dL (ref 30.0–36.0)
MCV: 97.5 fL (ref 80.0–100.0)
Platelets: 107 10*3/uL — ABNORMAL LOW (ref 150–400)
RBC: 2.85 MIL/uL — ABNORMAL LOW (ref 4.22–5.81)
RDW: 13.7 % (ref 11.5–15.5)
WBC: 11.5 10*3/uL — ABNORMAL HIGH (ref 4.0–10.5)
nRBC: 0 % (ref 0.0–0.2)

## 2020-09-04 NOTE — Progress Notes (Signed)
Physical Therapy Treatment Patient Details Name: [name] MRN: [id number] DOB: 1940-05-15 Today's Date: 09/04/2020    History of Present Illness Patient is 80 y.o. male s/p Rt THA anterior approach on 09/02/20 with PMH significant for OA, CAD, AAA s/p repair. HLD, HTN, PAF, PVD, SBO, Stroke, CABG in 2017.    PT Comments    Patient progressing well with acute PT and spouse present for family training. Pt ambulated ~60' with guarding from spouse and completed stair mobility with no overt LOB and safe assist from wife. Reviewed HEP form ROM and strengthening and encouraged pt to mobility. Pt will benefit from HHPT follow to progress strength and mobility and reduce fall risk. Acute PT will continue to progress during stay.     Follow Up Recommendations  Home health PT (pt requested HPPT follow up - "I want to be more than mobile")     Equipment Recommendations  None recommended by PT    Recommendations for Other Services       Precautions / Restrictions Precautions Precautions: Fall Restrictions Weight Bearing Restrictions: No RLE Weight Bearing: Weight bearing as tolerated    Mobility  Bed Mobility Overal bed mobility: Needs Assistance Bed Mobility: Supine to Sit     Supine to sit: Min guard;Supervision;HOB elevated     General bed mobility comments: exercises performed to loosen up hip stiffness in supine. min guard/supervision to move to EOB, pt using belt for Rt LE and bed rail to pivot to EOB.    Transfers Overall transfer level: Needs assistance Equipment used: Rolling walker (2 wheeled) Transfers: Sit to/from Stand Sit to Stand: Min guard         General transfer comment: pt using bil UE on EOB to rise to walker, guarding for safety.  Ambulation/Gait Ambulation/Gait assistance: Min guard Gait Distance (Feet): 60 Feet Assistive device: Rolling walker (2 wheeled) Gait Pattern/deviations: Step-to pattern;Decreased stride length;Decreased weight shift  to right;Step-through pattern Gait velocity: decr   General Gait Details: good carryover for safe step pattern with RW and proximity. Pt's wife present and provided safe guarding during gait, pt's wife using SPC to steady self as well and educated pt/spouse that it may help to have additional person for safety with stairs when going home   Stairs   Stairs assistance: Min guard Stair Management: Two rails;Step to pattern;Forwards Number of Stairs: 3 General stair comments: pt with good recall for step pattern, cues for pt's wife to provide safe guarding with cues and supervision from therapist.   Wheelchair Mobility    Modified Rankin (Stroke Patients Only)       Balance Overall balance assessment: Needs assistance Sitting-balance support: Feet supported Sitting balance-Leahy Scale: Good     Standing balance support: During functional activity;Bilateral upper extremity supported Standing balance-Leahy Scale: Poor                              Cognition Arousal/Alertness: Awake/alert Behavior During Therapy: WFL for tasks assessed/performed Overall Cognitive Status: Within Functional Limits for tasks assessed                                        Exercises Total Joint Exercises Heel Slides: Right;AAROM;10 reps;Supine Hip ABduction/ADduction: Right;AAROM;10 reps;Supine Long Arc Quad: AROM;Right;Seated;5 reps    General Comments        Pertinent Vitals/Pain Pain Assessment: Faces  Faces Pain Scale: Hurts little more Pain Location: Rt hip Pain Descriptors / Indicators: Burning;Aching;Tightness (stiff) Pain Intervention(s): Limited activity within patient's tolerance;Monitored during session;Repositioned;Ice applied;Patient requesting pain meds-RN notified    Home Living                      Prior Function            PT Goals (current goals can now be found in the care plan section) Acute Rehab PT Goals Patient Stated Goal:  get back independence to keep working in yard and golfing PT Goal Formulation: With patient Time For Goal Achievement: 09/09/20 Potential to Achieve Goals: Good Progress towards PT goals: Progressing toward goals    Frequency    7X/week      PT Plan Current plan remains appropriate    Co-evaluation              AM-PAC PT "6 Clicks" Mobility   Outcome Measure  Help needed turning from your back to your side while in a flat bed without using bedrails?: A Little Help needed moving from lying on your back to sitting on the side of a flat bed without using bedrails?: A Little Help needed moving to and from a bed to a chair (including a wheelchair)?: A Little Help needed standing up from a chair using your arms (e.g., wheelchair or bedside chair)?: A Little Help needed to walk in hospital room?: A Little Help needed climbing 3-5 steps with a railing? : A Little 6 Click Score: 18    End of Session Equipment Utilized During Treatment: Gait belt Activity Tolerance: Patient tolerated treatment well;Treatment limited secondary to medical complications (Comment) (diaphoresis while ambuation (hypotension and tachycardia)) Patient left: in chair;with call bell/phone within reach;with family/visitor present Nurse Communication: Mobility status PT Visit Diagnosis: Muscle weakness (generalized) (M62.81);Difficulty in walking, not elsewhere classified (R26.2)     Time: [id number] PT Time Calculation (min) (ACUTE ONLY): 19 min  Charges:  $Gait Training: 8-22 mins                     [name], DPT Acute Rehabilitation Services Office [phone] Pager [phone]    Jacques Navy 09/04/2020, 6:42 PM

## 2020-09-04 NOTE — Plan of Care (Signed)
  Problem: Activity: Goal: Risk for activity intolerance will decrease Outcome: Progressing   Problem: Pain Managment: Goal: General experience of comfort will improve Outcome: Progressing   Problem: Safety: Goal: Ability to remain free from injury will improve Outcome: Progressing   

## 2020-09-04 NOTE — Progress Notes (Addendum)
Physical Therapy Treatment Patient Details Name: [name] MRN: [id number] DOB: Apr 14, 1940 Today's Date: 09/04/2020    History of Present Illness Patient is 80 y.o. male s/p Rt THA anterior approach on 09/02/20 with PMH significant for OA, CAD, AAA s/p repair. HLD, HTN, PAF, PVD, SBO, Stroke, CABG in 2017.    PT Comments    Patient progressing well with acute PT and ambulated ~ 100' with min guard. He demonstrated good carryover for hand placement/technique to transfer with RW. Pt initiated stair training with cues for sequence and guarding for safety. No overt LOB noted. Pt educated on HEP for ROM and strength. He will benefit from skilled PT interventions and follow for PM session to perform family training in preparation for safe discharge.    Follow Up Recommendations  Outpatient PT (pt requested OPPT follow up - "I want to be more than mobile")     Equipment Recommendations  None recommended by PT    Recommendations for Other Services       Precautions / Restrictions Precautions Precautions: Fall Restrictions Weight Bearing Restrictions: No RLE Weight Bearing: Weight bearing as tolerated    Mobility  Bed Mobility               General bed mobility comments: pt OOB in recliner    Transfers Overall transfer level: Needs assistance Equipment used: Rolling walker (2 wheeled) Transfers: Sit to/from Stand Sit to Stand: From elevated surface;Min guard         General transfer comment: pt with good carryover for safe hand placement, bil UE use for power up from recliner. extra time and effort required due to being stiff.  Ambulation/Gait Ambulation/Gait assistance: Min guard Gait Distance (Feet): 100 Feet Assistive device: Rolling walker (2 wheeled) Gait Pattern/deviations: Step-to pattern;Decreased stride length;Decreased weight shift to right;Step-through pattern Gait velocity: decr   General Gait Details: initially step to pattern with pt  progressing to step through. no overt LOB noted and pt throughout. 3 occasions where pt stopped due to tightness in Rt thigh, performed knee flexion stretch for relief in standing.   Stairs Stairs: Yes Stairs assistance: Min guard Stair Management: Two rails;Step to pattern;Forwards Number of Stairs: 3 General stair comments: cues for sequence "up with good, down with bad" no overt LOB noted. pt verbalized safe guarding position for transfers and gait.   Wheelchair Mobility    Modified Rankin (Stroke Patients Only)       Balance Overall balance assessment: Needs assistance Sitting-balance support: Feet supported Sitting balance-Leahy Scale: Good     Standing balance support: During functional activity;Bilateral upper extremity supported Standing balance-Leahy Scale: Poor                              Cognition Arousal/Alertness: Awake/alert Behavior During Therapy: WFL for tasks assessed/performed Overall Cognitive Status: Within Functional Limits for tasks assessed                                        Exercises Total Joint Exercises Ankle Circles/Pumps: AROM;Both;Seated;15 reps Quad Sets: AROM;Right;Seated;5 reps Short Arc Quad: AROM;Right;Seated;5 reps Heel Slides: Right;Seated;5 reps;AAROM Hip ABduction/ADduction: Right;Seated;5 reps;AAROM    General Comments        Pertinent Vitals/Pain Pain Assessment: Faces Faces Pain Scale: Hurts little more Pain Location: Rt hip Pain Descriptors / Indicators: Burning;Aching;Tightness Pain Intervention(s): Limited activity within  patient's tolerance;Monitored during session;Repositioned;Ice applied;Patient requesting pain meds-RN notified    Home Living                      Prior Function            PT Goals (current goals can now be found in the care plan section) Acute Rehab PT Goals Patient Stated Goal: get back independence to keep working in yard and golfing PT Goal  Formulation: With patient Time For Goal Achievement: 09/09/20 Potential to Achieve Goals: Good Progress towards PT goals: Progressing toward goals    Frequency    7X/week      PT Plan Current plan remains appropriate    Co-evaluation              AM-PAC PT "6 Clicks" Mobility   Outcome Measure  Help needed turning from your back to your side while in a flat bed without using bedrails?: A Little Help needed moving from lying on your back to sitting on the side of a flat bed without using bedrails?: A Little Help needed moving to and from a bed to a chair (including a wheelchair)?: A Little Help needed standing up from a chair using your arms (e.g., wheelchair or bedside chair)?: A Little Help needed to walk in hospital room?: A Little Help needed climbing 3-5 steps with a railing? : A Little 6 Click Score: 18    End of Session Equipment Utilized During Treatment: Gait belt Activity Tolerance: Patient tolerated treatment well;Treatment limited secondary to medical complications (Comment) (diaphoresis while ambuation (hypotension and tachycardia)) Patient left: in chair;with call bell/phone within reach;with family/visitor present Nurse Communication: Mobility status PT Visit Diagnosis: Muscle weakness (generalized) (M62.81);Difficulty in walking, not elsewhere classified (R26.2)     Time: [id number] PT Time Calculation (min) (ACUTE ONLY): 23 min  Charges:  $Gait Training: 8-22 mins $Therapeutic Exercise: 8-22 mins                     [name], DPT Acute Rehabilitation Services Office [phone] Pager [phone]    [name] 09/04/2020, 9:06 AM

## 2020-09-04 NOTE — Progress Notes (Signed)
Discharge package printed and instructions given to pt. Patient verbalizes understanding. 

## 2020-09-04 NOTE — Progress Notes (Signed)
Subjective: 2 Days Post-Op Procedure(s) (LRB): TOTAL HIP ARTHROPLASTY ANTERIOR APPROACH (Right) Patient reports pain as mild.   Patient seen in rounds for Dr. Wynelle Link. Patient is well, and has had no acute complaints or problems. Had issues with orthostatic hypotension yesterday, fluids increased with improvement. Performed second session without difficulty and feels very encouraged this AM. Denies chest pain or SOB, vital signs WNL. Had two bowel movements last night. States he is ready to go home.  Plan is to go Home after hospital stay.  Objective: Vital signs in last 24 hours: Temp:  [97 F (36.1 C)-99.2 F (37.3 C)] 97.6 F (36.4 C) (08/19 0532) Pulse Rate:  [63-130] 73 (08/19 0532) Resp:  [16-18] 17 (08/19 0532) BP: (89-140)/(53-72) 140/68 (08/19 0532) SpO2:  [97 %-100 %] 99 % (08/19 0532)  Intake/Output from previous day:  Intake/Output Summary (Last 24 hours) at 09/04/2020 0700 Last data filed at 09/04/2020 [id number] Gross per 24 hour  Intake 2670.4 ml  Output 1550 ml  Net 1120.4 ml    Intake/Output this shift: Total I/O In: 2130.4 [P.O.:480; I.V.:1650.4] Out: 1100 [Urine:1100]  Labs: Recent Labs    09/03/20 0348 09/04/20 0318  HGB 10.1* 9.1*   Recent Labs    09/03/20 0348 09/04/20 0318  WBC 10.5 11.5*  RBC 3.17* 2.85*  HCT 31.0* 27.8*  PLT 124* 107*   Recent Labs    09/03/20 0348 09/04/20 0318  NA 139 140  K 4.4 3.9  CL 106 112*  CO2 26 24  BUN 17 16  CREATININE 1.20 0.94  GLUCOSE 139* 127*  CALCIUM 8.8* 8.8*   No results for input(s): LABPT, INR in the last 72 hours.  Exam: General - Patient is Alert and Oriented Extremity - Neurologically intact Neurovascular intact Sensation intact distally Dorsiflexion/Plantar flexion intact Dressing/Incision - clean, dry, no drainage Motor Function - intact, moving foot and toes well on exam.   Past Medical History:  Diagnosis Date   Arthritis    HANDS AND FEET   Asymptomatic carotid artery  stenosis BILATERAL   PER DOPPLER  03/2016  RICA  1 - 39%/  s/p  L CEA  1-39%- followed by Dr. 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   Nocturia    Paroxysmal atrial fibrillation (HCC) 06/30/2014   chad2vasc score is at least 4   Popliteal artery aneurysm (HCC) LEFT   PVD (peripheral vascular disease) (HCC)    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   Weight as of this encounter: 82.6 kg. Up with therapy  DVT Prophylaxis -  Eliquis Weight-bearing as tolerated  Plan for discharge to home with HEP after one session of PT if no further issues.  Follow-up in the office in 2 weeks  Theresa Duty, PA-C Orthopedic Surgery [phone] 09/04/2020, 7:00 AM

## 2020-09-07 NOTE — Discharge Summary (Signed)
Physician Discharge Summary   Patient ID: [name] MRN: [id number] DOB/AGE: [birth year] 80 y.o.  Admit date: 09/02/2020 Discharge date: 09/04/2020  Primary Diagnosis: Osteoarthritis, right hip   Admission Diagnoses:  Past Medical History:  Diagnosis Date   Arthritis    HANDS AND FEET   Asymptomatic carotid artery stenosis BILATERAL   PER DOPPLER  03/2016  RICA  1 - 39%/  s/p  L CEA  1-39%- followed by Dr. 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   Nocturia    Paroxysmal atrial fibrillation (HCC) 06/30/2014   chad2vasc score is at least 4   Popliteal artery aneurysm (HCC) LEFT   PVD (peripheral vascular disease) (HCC)    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   Retinal detachment    SBO (small bowel obstruction) (HCC)    Stroke ([place])    mini stroke/2017   Discharge Diagnoses:   Principal Problem:   OA (osteoarthritis) of hip Active Problems:   Primary osteoarthritis of right hip  Estimated body mass index is 24.68 kg/m as calculated from the following:   Height as of this encounter: 6' (1.829 m).   Weight as of this encounter: 82.6 kg.  Procedure:  Procedure(s) (LRB): TOTAL HIP ARTHROPLASTY ANTERIOR APPROACH (Right)   Consults: None  HPI: [name] is a 80 y.o. male who has advanced end- stage arthritis of their Right  hip with progressively worsening pain and dysfunction.The patient has failed nonoperative management and presents for total hip arthroplasty.   Laboratory Data: Admission on 09/02/2020, Discharged on 09/04/2020   Component Date Value Ref Range Status   WBC 09/03/2020 10.5  4.0 - 10.5 K/uL Final   RBC 09/03/2020 3.17 (A) 4.22 - 5.81 MIL/uL Final   Hemoglobin 09/03/2020 10.1 (A) 13.0 - 17.0 g/dL Final   HCT 09/03/2020 31.0 (A) 39.0 - 52.0 % Final   MCV 09/03/2020 97.8  80.0 - 100.0 fL Final   MCH 09/03/2020 31.9  26.0 - 34.0 pg Final   MCHC 09/03/2020 32.6  30.0 - 36.0 g/dL Final   RDW 09/03/2020 13.2  11.5 - 15.5 % Final   Platelets 09/03/2020 124 (A) 150 - 400 K/uL Final   Comment: Immature Platelet Fraction may be clinically indicated, consider ordering this additional test [id number] CONSISTENT WITH PREVIOUS RESULT REPEATED TO VERIFY    nRBC 09/03/2020 0.0  0.0 - 0.2 % Final   Performed at [facility], [place] [address], [place], [place] [address]   Sodium 09/03/2020 139  135 - 145 mmol/L Final   Potassium 09/03/2020 4.4  3.5 - 5.1 mmol/L Final   Chloride 09/03/2020 106  98 - 111 mmol/L Final   CO2 09/03/2020 26  22 - 32 mmol/L Final   Glucose, Bld 09/03/2020 139 (A) 70 - 99 mg/dL Final   Glucose reference range applies only to samples taken after fasting for at least 8 hours.   BUN 09/03/2020 17  8 - 23 mg/dL Final   Creatinine, Ser 09/03/2020 1.20  0.61 - 1.24 mg/dL Final   Calcium 09/03/2020 8.8 (A) 8.9 - 10.3 mg/dL Final   GFR, Estimated 09/03/2020 >60  >60 mL/min Final   Comment: (NOTE) Calculated using the CKD-EPI Creatinine Equation (2021)    Anion gap 09/03/2020 7  5 - 15 Final   Performed at [facility], [place] [address], [place], [place] [address]   WBC 09/04/2020 11.5 (A) 4.0 - 10.5 K/uL Final   RBC 09/04/2020 2.85 (A) 4.22 - 5.81 MIL/uL Final   Hemoglobin 09/04/2020 9.1 (A) 13.0 - 17.0 g/dL Final   HCT 09/04/2020 27.8 (A) 39.0 - 52.0 % Final   MCV 09/04/2020 97.5  80.0 - 100.0 fL Final   MCH 09/04/2020 31.9  26.0 - 34.0 pg Final   MCHC 09/04/2020 32.7  30.0 - 36.0 g/dL Final   RDW 09/04/2020 13.7  11.5 - 15.5 % Final   Platelets  09/04/2020 107 (A) 150 - 400 K/uL Final   Comment: SPECIMEN CHECKED FOR CLOTS Immature Platelet Fraction may be clinically indicated, consider ordering this additional test [id number] REPEATED TO VERIFY PLATELET COUNT CONFIRMED BY SMEAR    nRBC 09/04/2020 0.0  0.0 - 0.2 % Final   Performed at [facility], [place] [address], [place], [place] [address]   Sodium 09/04/2020 140  135 - 145 mmol/L Final   Potassium 09/04/2020 3.9  3.5 - 5.1 mmol/L Final   Chloride 09/04/2020 112 (A) 98 - 111 mmol/L Final   CO2 09/04/2020 24  22 - 32 mmol/L Final   Glucose, Bld 09/04/2020 127 (A) 70 - 99 mg/dL Final   Glucose reference range applies only to samples taken after fasting for at least 8 hours.   BUN 09/04/2020 16  8 - 23 mg/dL Final   Creatinine, Ser 09/04/2020 0.94  0.61 - 1.24 mg/dL Final   Calcium 09/04/2020 8.8 (A) 8.9 - 10.3 mg/dL Final   GFR, Estimated 09/04/2020 >60  >60 mL/min Final   Comment: (NOTE) Calculated using the CKD-EPI Creatinine Equation (2021)    Anion gap 09/04/2020 4 (A) 5 - 15 Final   Performed at [facility], [place] [address] [place], [place] [address]  Orders Only on 08/31/2020  Component Date Value Ref Range Status   SARS Coronavirus 2 08/31/2020 RESULT: NEGATIVE   Final   Comment: RESULT: NEGATIVESARS-CoV-2 INTERPRETATION:A NEGATIVE  test result means that SARS-CoV-2 RNA was not present in the specimen above the limit of detection of this test. This does not preclude a possible SARS-CoV-2 infection and should not be used as the  sole basis for patient management decisions. Negative results must be combined with clinical observations, patient history, and epidemiological information. Optimum specimen types and timing for peak viral levels during infections caused by SARS-CoV-2  have not been determined. Collection of multiple specimens or types of specimens may be necessary to detect virus. Improper specimen collection and handling,  sequence variability under primers/probes, or organism present below the limit of detection may  lead to false negative results. Positive and negative predictive values of testing are highly dependent on prevalence. False negative test results are more likely when prevalence of disease is high.The expected result is NEGATIVE.Fact S                          heet for  Healthcare Providers: LocalChronicle.no Sheet for Patients: SalonLookup.es Reference Range - Negative   Hospital Outpatient Visit on 08/28/2020  Component Date Value Ref Range  Status   WBC 08/28/2020 4.4  4.0 - 10.5 K/uL Final   RBC 08/28/2020 4.14 (A) 4.22 - 5.81 MIL/uL Final   Hemoglobin 08/28/2020 13.1  13.0 - 17.0 g/dL Final   HCT 08/28/2020 40.9  39.0 - 52.0 % Final   MCV 08/28/2020 98.8  80.0 - 100.0 fL Final   MCH 08/28/2020 31.6  26.0 - 34.0 pg Final   MCHC 08/28/2020 32.0  30.0 - 36.0 g/dL Final   RDW 08/28/2020 13.3  11.5 - 15.5 % Final   Platelets 08/28/2020 143 (A) 150 - 400 K/uL Final   nRBC 08/28/2020 0.0  0.0 - 0.2 % Final   Performed at [facility], [place] [address], [place], [place] [address]   Sodium 08/28/2020 141  135 - 145 mmol/L Final   Potassium 08/28/2020 5.0  3.5 - 5.1 mmol/L Final   Chloride 08/28/2020 107  98 - 111 mmol/L Final   CO2 08/28/2020 26  22 - 32 mmol/L Final   Glucose, Bld 08/28/2020 107 (A) 70 - 99 mg/dL Final   Glucose reference range applies only to samples taken after fasting for at least 8 hours.   BUN 08/28/2020 29 (A) 8 - 23 mg/dL Final   Creatinine, Ser 08/28/2020 1.17  0.61 - 1.24 mg/dL Final   Calcium 08/28/2020 9.8  8.9 - 10.3 mg/dL Final   Total Protein 08/28/2020 7.4  6.5 - 8.1 g/dL Final   Albumin 08/28/2020 4.5  3.5 - 5.0 g/dL Final   AST 08/28/2020 30  15 - 41 U/L Final   ALT 08/28/2020 28  0 - 44 U/L Final   Alkaline Phosphatase 08/28/2020 38  38 - 126 U/L Final   Total Bilirubin 08/28/2020  0.8  0.3 - 1.2 mg/dL Final   GFR, Estimated 08/28/2020 >60  >60 mL/min Final   Comment: (NOTE) Calculated using the CKD-EPI Creatinine Equation (2021)    Anion gap 08/28/2020 8  5 - 15 Final   Performed at [facility], [place] [address], [place], [place] [address]   Prothrombin Time 08/28/2020 16.8 (A) 11.4 - 15.2 seconds Final   INR 08/28/2020 1.4 (A) 0.8 - 1.2 Final   Comment: (NOTE) INR goal varies based on device and disease states. Performed at [facility], [place] [address], [place], [place] [address]    aPTT 08/28/2020 34  24 - 36 seconds Final   Performed at [facility], [place] [address] [place], [place] [address]   ABO/RH(D) 08/28/2020 O POS   Final   Antibody Screen 08/28/2020 NEG   Final   Sample Expiration 08/28/2020 09/05/2020,2359   Final   Extend sample reason 08/28/2020    Final                   Value:NO TRANSFUSIONS OR PREGNANCY IN THE PAST 3 MONTHS Performed at [place] [address], [place], [place] [address]    MRSA, PCR 08/28/2020 NEGATIVE  NEGATIVE Final   Staphylococcus aureus 08/28/2020 NEGATIVE  NEGATIVE Final   Comment: (NOTE) The Xpert SA Assay (FDA approved for NASAL specimens in patients 44 years of age and older), is one component of a comprehensive surveillance program. It is not intended to diagnose infection nor to guide or monitor treatment. Performed at [facility], [place] [address] [place], [place] [address]      X-Rays:DG Pelvis Portable  Result Date: 09/02/2020 CLINICAL DATA:  Status post hip replacement EXAM: PORTABLE PELVIS 1-2  VIEWS COMPARISON:  09/02/2020 FINDINGS: Status post right hip replacement with intact hardware and normal alignment. Gas in the soft tissues consistent with recent surgery. Vascular calcifications. Moderate arthritis of the left hip IMPRESSION: Status post right hip replacement with expected postsurgical change  Electronically Signed   By: Donavan Foil M.D.   On: 09/02/2020 15:18   DG C-Arm 1-60 Min-No Report  Result Date: 09/02/2020 Fluoroscopy was utilized by the requesting physician.  No radiographic interpretation.   VAS [place] ABI WITH/WO TBI  Result Date: 08/10/2020  LOWER EXTREMITY DOPPLER STUDY Patient Name:  [name]  Date of Exam:   08/10/2020 Medical Rec #: [id number]         Accession #:    [id number] Date of Birth: [birth year]        Patient Gender: M Patient Age:   79Y Exam Location:  [facility] Procedure:      VAS [place] ABI WITH/WO TBI Referring Phys: [id number] TODD F EARLY --------------------------------------------------------------------------------  Indications: Claudication, peripheral artery disease, and Status post              aortobifemoral bypass in 2003. High Risk         Hypertension, hyperlipidemia, coronary artery disease, prior Factors:          CVA.  Comparison Study: 10/03/17 Performing Technologist: June Leap RDMS, RVT  Examination Guidelines: A complete evaluation includes at minimum, Doppler waveform signals and systolic blood pressure reading at the level of bilateral brachial, anterior tibial, and posterior tibial arteries, when vessel segments are accessible. Bilateral testing is considered an integral part of a complete examination. Photoelectric Plethysmograph (PPG) waveforms and toe systolic pressure readings are included as required and additional duplex testing as needed. Limited examinations for reoccurring indications may be performed as noted.  ABI Findings: +---------+------------------+-----+----------+--------+ Right    Rt Pressure (mmHg)IndexWaveform  Comment  +---------+------------------+-----+----------+--------+ Brachial 130                                       +---------+------------------+-----+----------+--------+ ATA      79                0.61 monophasic         +---------+------------------+-----+----------+--------+ PTA       106               0.82 monophasic         +---------+------------------+-----+----------+--------+ Great Toe65                0.50 Abnormal           +---------+------------------+-----+----------+--------+ +---------+------------------+-----+----------+-------+ Left     Lt Pressure (mmHg)IndexWaveform  Comment +---------+------------------+-----+----------+-------+ ATA      110               0.85 monophasic        +---------+------------------+-----+----------+-------+ PTA      129               0.99 monophasic        +---------+------------------+-----+----------+-------+ Great Toe59                0.45 Abnormal          +---------+------------------+-----+----------+-------+ +-------+-----------+-----------+------------+------------+ ABI/TBIToday's ABIToday's TBIPrevious ABIPrevious TBI +-------+-----------+-----------+------------+------------+ Right  0.82       0.5        0.61        0.36         +-------+-----------+-----------+------------+------------+  Left   0.99       0.45       0.82        0.35         +-------+-----------+-----------+------------+------------+  Summary: Right: Resting right ankle-brachial index indicates mild right lower extremity arterial disease. The right toe-brachial index is abnormal. Pedal artery waveforms indicate a possibly more severe level of disease. Left: Resting left ankle-brachial index indicates mild left lower extremity arterial disease. The left toe-brachial index is abnormal. Pedal artery waveforms indicate a possibly more severe level of disease.  *See table(s) above for measurements and observations.  Electronically signed by Harold Barban MD on 08/10/2020 at 6:48:43 PM.    Final    DG HIP OPERATIVE UNILAT WITH PELVIS RIGHT  Result Date: 09/02/2020 CLINICAL DATA:  Right hip replacement. EXAM: OPERATIVE right HIP (WITH PELVIS IF PERFORMED) 2 VIEWS TECHNIQUE: Fluoroscopic spot image(s) were submitted for  interpretation post-operatively. Radiation exposure index: 1.2107 mGy. COMPARISON:  None. FINDINGS: Two intraoperative fluoroscopic images were obtained of the right hip. The right femoral and acetabular components appear to be well situated. IMPRESSION: Fluoroscopic guidance provided during right total hip arthroplasty. Electronically Signed   By: Marijo Conception M.D.   On: 09/02/2020 13:51   VAS US CAROTID  Result Date: 08/10/2020 Carotid Arterial Duplex Study Patient Name:  [name]  Date of Exam:   08/10/2020 Medical Rec #: [id number]         Accession #:    [id number] Date of Birth: March 11, 1940        Patient Gender: M Patient Age:   79Y Exam Location:  [facility] Procedure:      VAS US CAROTID Referring Phys: [id number] TODD F EARLY --------------------------------------------------------------------------------  Risk Factors:     Hypertension, hyperlipidemia, coronary artery disease, prior                   CVA. Other Factors:    Left carotid endarterectomy 03/11/2015. Comparison Study: 10/08/19 R 50-69% L WNL [place] Na) Performing Technologist: June Leap RDMS, RVT  Examination Guidelines: A complete evaluation includes B-mode imaging, spectral Doppler, color Doppler, and power Doppler as needed of all accessible portions of each vessel. Bilateral testing is considered an integral part of a complete examination. Limited examinations for reoccurring indications may be performed as noted.  Right Carotid Findings: +----------+--------+--------+--------+------------------+--------+           PSV cm/sEDV cm/sStenosisPlaque DescriptionComments +----------+--------+--------+--------+------------------+--------+ CCA Prox  76      14                                         +----------+--------+--------+--------+------------------+--------+ CCA Distal108     21                                         +----------+--------+--------+--------+------------------+--------+ ICA  Prox  144     36      40-59%  heterogenous               +----------+--------+--------+--------+------------------+--------+ ICA Mid   96      27                                         +----------+--------+--------+--------+------------------+--------+  ICA Distal82      26                                         +----------+--------+--------+--------+------------------+--------+ ECA       80      7               heterogenous               +----------+--------+--------+--------+------------------+--------+ +----------+--------+-------+----------------+-------------------+           PSV cm/sEDV cmsDescribe        Arm Pressure (mmHG) +----------+--------+-------+----------------+-------------------+ [id number]             Multiphasic, WNL                    +----------+--------+-------+----------------+-------------------+ +---------+--------+--+--------+--+---------+ VertebralPSV cm/s44EDV cm/s16Antegrade +---------+--------+--+--------+--+---------+  Left Carotid Findings: +----------+--------+--------+--------+------------------+--------+           PSV cm/sEDV cm/sStenosisPlaque DescriptionComments +----------+--------+--------+--------+------------------+--------+ CCA Prox  83      19                                         +----------+--------+--------+--------+------------------+--------+ CCA Distal110     23                                         +----------+--------+--------+--------+------------------+--------+ ICA Prox  62      22      1-39%   heterogenous               +----------+--------+--------+--------+------------------+--------+ ICA Mid   80      29                                         +----------+--------+--------+--------+------------------+--------+ ICA Distal71      22                                         +----------+--------+--------+--------+------------------+--------+ ECA       95      13                                          +----------+--------+--------+--------+------------------+--------+ +----------+--------+--------+----------------+-------------------+           PSV cm/sEDV cm/sDescribe        Arm Pressure (mmHG) +----------+--------+--------+----------------+-------------------+ [id number]              Multiphasic, WNL                    +----------+--------+--------+----------------+-------------------+ +---------+--------+--+--------+-+---------+ VertebralPSV cm/s39EDV cm/s8Antegrade +---------+--------+--+--------+-+---------+   Summary: Right Carotid: Velocities in the right ICA are consistent with a 40-59%                stenosis. Left Carotid: Velocities in the left ICA are consistent with a 1-39% stenosis.  *See table(s) above for measurements and observations.  Electronically signed by Harold Barban MD on 08/10/2020 at 6:49:01 PM.  Final    VAS [place] LOWER EXTREMITY ARTERIAL DUPLEX  Result Date: 08/10/2020 LIMITED LOWER EXTREMITY ARTERIAL DUPLEX STUDY Patient Name:  [name]  Date of Exam:   08/10/2020 Medical Rec #: [id number]         Accession #:    [id number] Date of Birth: Jan 29, 1940        Patient Gender: M Patient Age:   79Y Exam Location:  [facility] Procedure:      VAS [place] LOWER EXTREMITY ARTERIAL DUPLEX Referring Phys: [id number] TODD F EARLY --------------------------------------------------------------------------------  Indications: Peripheral artery disease. Other Factors: Hx AAA, S/P Aorta bifem. Hx ectatic popliteal arteries.  Current ABI: R 0.82 L 0.99 Comparison Study: 10/03/17 ectatic popliteal arteries Performing Technologist: June Leap RDMS, RVT  Examination Guidelines: A complete evaluation includes B-mode imaging, spectral Doppler, color Doppler, and power Doppler as needed of all accessible portions of each vessel. Bilateral testing is considered an integral part of a complete examination. Limited examinations for  reoccurring indications may be performed as noted.  +---------------+-------+-----------+----------+--------+-----+--------+ Right PoplitealAP (cm)Transv (cm)Waveform  StenosisShapeComments +---------------+-------+-----------+----------+--------+-----+--------+ Proximal       1.01   1.09       monophasic                      +---------------+-------+-----------+----------+--------+-----+--------+ Mid            0.90   0.85       monophasic                      +---------------+-------+-----------+----------+--------+-----+--------+ Distal         0.84   0.99       monophasic                      +---------------+-------+-----------+----------+--------+-----+--------+ +--------------+-------+-----------+----------+--------+-----+--------+ Left PoplitealAP (cm)Transv (cm)Waveform  StenosisShapeComments +--------------+-------+-----------+----------+--------+-----+--------+ Proximal      0.88   0.96       monophasic                      +--------------+-------+-----------+----------+--------+-----+--------+ Mid           0.99   0.84       monophasic                      +--------------+-------+-----------+----------+--------+-----+--------+ Distal        0.53   0.55       monophasic                      +--------------+-------+-----------+----------+--------+-----+--------+  Summary: Right: Ectatic popliteal artery. Measurements appear unchanged from previous exam. Left: Ectatic popliteal artery. Measurements appear unchanged from previous exam.  See table(s) above for measurements and observations. Electronically signed by Harold Barban MD on 08/10/2020 at 6:49:42 PM.    Final     EKG: Orders placed or performed during the hospital encounter of 04/21/20   EKG 12-Lead   EKG 12-Lead     Hospital Course: [name] is a 80 y.o. who was admitted to [facility]. They were brought to the operating room on 09/02/2020 and underwent  Procedure(s): [place].  Patient tolerated the procedure well and was later transferred to the recovery room and then to the orthopaedic floor for postoperative care. They were given PO and IV analgesics for pain control following their surgery. They were given 24 hours of postoperative antibiotics of  Anti-infectives (From  admission, onward)    Start     Dose/Rate Route Frequency Ordered Stop   09/02/20 1600  ceFAZolin (ANCEF) IVPB 2g/100 mL premix        2 g 200 mL/hr over 30 Minutes Intravenous Every 6 hours 09/02/20 1428 09/02/20 2245   09/02/20 0800  ceFAZolin (ANCEF) IVPB 2g/100 mL premix        2 g 200 mL/hr over 30 Minutes Intravenous On call to O.R. 09/02/20 [id number] 09/02/20 0958      and started on DVT prophylaxis in the form of  Eliquis .   PT and OT were ordered for total joint protocol. Discharge planning consulted to help with postop disposition and equipment needs. Patient had a good night on the evening of surgery. They started to get up OOB with therapy on POD #0. BP was noted to be soft, a 250 mL bolus was ordered. Patient had orthostatic hypotension with first session of physical therapy that AM. A 500 mL bolus was ordered and IV fluids were kept running. By the second session of PT, patient did much better with no blood pressure issues.  Pt was seen during rounds on day two and was ready to go home pending progress with therapy. Incision was clean, dry, and intact with no drainage. Pt worked with therapy for two additional sessions and was meeting their goals. He was discharged to home later that day in stable condition.  Diet: Cardiac diet Activity: WBAT Follow-up: in 2 weeks Disposition: Home with HEP Discharged Condition: stable   Discharge Instructions     Call MD / Call 911   Complete by: As directed    If you experience chest pain or shortness of breath, CALL 911 and be transported to the hospital emergency room.  If you develope a  fever above 101 F, pus (white drainage) or increased drainage or redness at the wound, or calf pain, call your surgeon's office.   Change dressing   Complete by: As directed    You have an adhesive waterproof bandage over the incision. Leave this in place until your first follow-up appointment. Once you remove this you will not need to place another bandage.   Constipation Prevention   Complete by: As directed    Drink plenty of fluids.  Prune juice may be helpful.  You may use a stool softener, such as Colace (over the counter) 100 mg twice a day.  Use MiraLax (over the counter) for constipation as needed.   Diet - low sodium heart healthy   Complete by: As directed    Do not sit on low chairs, stoools or toilet seats, as it may be difficult to get up from low surfaces   Complete by: As directed    Driving restrictions   Complete by: As directed    No driving for two weeks   Post-operative opioid taper instructions:   Complete by: As directed    POST-OPERATIVE OPIOID TAPER INSTRUCTIONS: It is important to wean off of your opioid medication as soon as possible. If you do not need pain medication after your surgery it is ok to stop day one. Opioids include: Codeine, Hydrocodone(Norco, Vicodin), Oxycodone(Percocet, oxycontin) and hydromorphone amongst others.  Long term and even short term use of opiods can cause: Increased pain response Dependence Constipation Depression Respiratory depression And more.  Withdrawal symptoms can include Flu like symptoms Nausea, vomiting And more Techniques to manage these symptoms Hydrate well Eat regular healthy meals Stay active Use relaxation techniques(deep breathing,  meditating, yoga) Do Not substitute Alcohol to help with tapering If you have been on opioids for less than two weeks and do not have pain than it is ok to stop all together.  Plan to wean off of opioids This plan should start within one week post op of your joint  replacement. Maintain the same interval or time between taking each dose and first decrease the dose.  Cut the total daily intake of opioids by one tablet each day Next start to increase the time between doses. The last dose that should be eliminated is the evening dose.      TED hose   Complete by: As directed    Use stockings (TED hose) for three weeks on both leg(s).  You may remove them at night for sleeping.   Weight bearing as tolerated   Complete by: As directed       Allergies as of 09/04/2020       Reactions   Wasp Venom Shortness Of Breath, Swelling   Ramipril Swelling        Medication List     STOP taking these medications    enoxaparin 120 MG/0.8ML injection Commonly known as: LOVENOX       TAKE these medications    acetaminophen 500 MG tablet Commonly known as: TYLENOL Take 2 tablets (1,000 mg total) by mouth every 6 (six) hours as needed. What changed: reasons to take this   apixaban 5 MG Tabs tablet Commonly known as: Eliquis Take 1 tablet (5 mg total) by mouth 2 (two) times daily.   b complex vitamins tablet Take 1 tablet by mouth daily.   calcium carbonate 600 MG Tabs tablet Commonly known as: OS-CAL Take 600 mg by mouth daily.   Cholecalciferol 25 MCG (1000 UT) capsule Take 1,000 Units by mouth daily.   Co Q 10 100 MG Caps Take 300 mg by mouth daily.   cyanocobalamin 1000 MCG tablet Take 1,000 mcg by mouth daily.   EPINEPHrine 0.3 mg/0.3 mL Soaj injection Commonly known as: EPI-PEN Inject 0.3 mg into the muscle daily as needed for anaphylaxis (allergic reaction).   finasteride 5 MG tablet Commonly known as: PROSCAR Take 5 mg by mouth at bedtime.   FISH OIL PO Take 1,200 mg by mouth 2 (two) times daily.   hydrALAZINE 25 MG tablet Commonly known as: APRESOLINE Take 25-50 mg by mouth See admin instructions. Take 25 mg in the morning and 50 mg in the evening   HYDROcodone-acetaminophen 5-325 MG tablet Commonly known as:  NORCO/VICODIN Take 1-2 tablets by mouth every 4 (four) hours as needed for severe pain.   losartan 50 MG tablet Commonly known as: COZAAR Take 1 tablet (50 mg total) by mouth daily.   MAGNESIA PO Take 500-1,000 mg by mouth See admin instructions. Take 500 mg in the morning 1000 mg at bedtime   methocarbamol 500 MG tablet Commonly known as: ROBAXIN Take 1 tablet (500 mg total) by mouth every 6 (six) hours as needed for muscle spasms.   multivitamin tablet Take 1 tablet by mouth daily.   potassium gluconate 595 (99 K) MG Tabs tablet Take 99 mg by mouth daily.   Prevagen 10 MG Caps Generic drug: Apoaequorin Take 10 mg by mouth daily.   rosuvastatin 40 MG tablet Commonly known as: CRESTOR Take 1 tablet (40 mg total) by mouth every evening.   Soothe XP Soln Place 1 drop into both eyes daily.   tamsulosin 0.4 MG Caps capsule Commonly known  as: FLOMAX Take 0.8 mg by mouth at bedtime.   traMADol 50 MG tablet Commonly known as: ULTRAM Take 1-2 tablets (50-100 mg total) by mouth every 6 (six) hours as needed for moderate pain.   Turmeric 500 MG Caps Take 500 mg by mouth daily.   vitamin C 500 MG tablet Commonly known as: ASCORBIC ACID Take 500 mg by mouth daily.   zinc gluconate 50 MG tablet Take 50 mg by mouth daily.               Discharge Care Instructions  (From admission, onward)           Start     Ordered   09/03/20 0000  Weight bearing as tolerated        09/03/20 0804   09/03/20 0000  Change dressing       Comments: You have an adhesive waterproof bandage over the incision. Leave this in place until your first follow-up appointment. Once you remove this you will not need to place another bandage.   09/03/20 0804            Follow-up Information     Gaynelle Arabian, MD. Go on 09/15/2020.   Specialty: Orthopedic Surgery Why: You are scheduled for a follow up appointment on 09-15-20 at 1:00 pm. Contact information: [address] [place]  [place] [address] [id number]                 Signed: Theresa Duty, PA-C Orthopedic Surgery 09/07/2020, 8:01 AM

## 2020-09-22 DIAGNOSIS — M25561 Pain in right knee: Secondary | ICD-10-CM | POA: Diagnosis not present

## 2020-09-23 DIAGNOSIS — R600 Localized edema: Secondary | ICD-10-CM | POA: Diagnosis not present

## 2020-09-23 DIAGNOSIS — R231 Pallor: Secondary | ICD-10-CM | POA: Diagnosis not present

## 2020-09-29 DIAGNOSIS — H61002 Unspecified perichondritis of left external ear: Secondary | ICD-10-CM | POA: Diagnosis not present

## 2020-09-29 DIAGNOSIS — D692 Other nonthrombocytopenic purpura: Secondary | ICD-10-CM | POA: Diagnosis not present

## 2020-09-29 DIAGNOSIS — Z85828 Personal history of other malignant neoplasm of skin: Secondary | ICD-10-CM | POA: Diagnosis not present

## 2020-09-29 DIAGNOSIS — L821 Other seborrheic keratosis: Secondary | ICD-10-CM | POA: Diagnosis not present

## 2020-09-29 DIAGNOSIS — L820 Inflamed seborrheic keratosis: Secondary | ICD-10-CM | POA: Diagnosis not present

## 2020-09-29 DIAGNOSIS — L570 Actinic keratosis: Secondary | ICD-10-CM | POA: Diagnosis not present

## 2020-09-29 DIAGNOSIS — L281 Prurigo nodularis: Secondary | ICD-10-CM | POA: Diagnosis not present

## 2020-10-06 DIAGNOSIS — M159 Polyosteoarthritis, unspecified: Secondary | ICD-10-CM | POA: Diagnosis not present

## 2020-10-06 DIAGNOSIS — Z96641 Presence of right artificial hip joint: Secondary | ICD-10-CM | POA: Diagnosis not present

## 2020-10-06 DIAGNOSIS — I4891 Unspecified atrial fibrillation: Secondary | ICD-10-CM | POA: Diagnosis not present

## 2020-10-06 DIAGNOSIS — I10 Essential (primary) hypertension: Secondary | ICD-10-CM | POA: Diagnosis not present

## 2020-10-06 DIAGNOSIS — I119 Hypertensive heart disease without heart failure: Secondary | ICD-10-CM | POA: Diagnosis not present

## 2020-10-06 DIAGNOSIS — Z471 Aftercare following joint replacement surgery: Secondary | ICD-10-CM | POA: Diagnosis not present

## 2020-10-06 DIAGNOSIS — N183 Chronic kidney disease, stage 3 unspecified: Secondary | ICD-10-CM | POA: Diagnosis not present

## 2020-10-06 DIAGNOSIS — I63512 Cerebral infarction due to unspecified occlusion or stenosis of left middle cerebral artery: Secondary | ICD-10-CM | POA: Diagnosis not present

## 2020-10-06 DIAGNOSIS — E785 Hyperlipidemia, unspecified: Secondary | ICD-10-CM | POA: Diagnosis not present

## 2020-10-06 DIAGNOSIS — E7800 Pure hypercholesterolemia, unspecified: Secondary | ICD-10-CM | POA: Diagnosis not present

## 2020-10-06 DIAGNOSIS — I2510 Atherosclerotic heart disease of native coronary artery without angina pectoris: Secondary | ICD-10-CM | POA: Diagnosis not present

## 2020-10-06 DIAGNOSIS — N400 Enlarged prostate without lower urinary tract symptoms: Secondary | ICD-10-CM | POA: Diagnosis not present

## 2020-10-07 ENCOUNTER — Other Ambulatory Visit: Payer: Self-pay

## 2020-10-07 ENCOUNTER — Encounter (INDEPENDENT_AMBULATORY_CARE_PROVIDER_SITE_OTHER): Payer: PPO | Admitting: Ophthalmology

## 2020-10-07 DIAGNOSIS — I10 Essential (primary) hypertension: Secondary | ICD-10-CM

## 2020-10-07 DIAGNOSIS — H33302 Unspecified retinal break, left eye: Secondary | ICD-10-CM | POA: Diagnosis not present

## 2020-10-07 DIAGNOSIS — H338 Other retinal detachments: Secondary | ICD-10-CM | POA: Diagnosis not present

## 2020-10-07 DIAGNOSIS — H43813 Vitreous degeneration, bilateral: Secondary | ICD-10-CM | POA: Diagnosis not present

## 2020-10-07 DIAGNOSIS — H35033 Hypertensive retinopathy, bilateral: Secondary | ICD-10-CM | POA: Diagnosis not present

## 2020-10-19 DIAGNOSIS — M9901 Segmental and somatic dysfunction of cervical region: Secondary | ICD-10-CM | POA: Diagnosis not present

## 2020-10-19 DIAGNOSIS — M546 Pain in thoracic spine: Secondary | ICD-10-CM | POA: Diagnosis not present

## 2020-10-19 DIAGNOSIS — M9902 Segmental and somatic dysfunction of thoracic region: Secondary | ICD-10-CM | POA: Diagnosis not present

## 2020-10-19 DIAGNOSIS — M542 Cervicalgia: Secondary | ICD-10-CM | POA: Diagnosis not present

## 2020-10-20 DIAGNOSIS — M542 Cervicalgia: Secondary | ICD-10-CM | POA: Diagnosis not present

## 2020-10-20 DIAGNOSIS — M546 Pain in thoracic spine: Secondary | ICD-10-CM | POA: Diagnosis not present

## 2020-10-20 DIAGNOSIS — M9902 Segmental and somatic dysfunction of thoracic region: Secondary | ICD-10-CM | POA: Diagnosis not present

## 2020-10-20 DIAGNOSIS — M9901 Segmental and somatic dysfunction of cervical region: Secondary | ICD-10-CM | POA: Diagnosis not present

## 2020-10-21 DIAGNOSIS — M542 Cervicalgia: Secondary | ICD-10-CM | POA: Diagnosis not present

## 2020-10-21 DIAGNOSIS — M9901 Segmental and somatic dysfunction of cervical region: Secondary | ICD-10-CM | POA: Diagnosis not present

## 2020-10-21 DIAGNOSIS — E162 Hypoglycemia, unspecified: Secondary | ICD-10-CM | POA: Diagnosis not present

## 2020-10-21 DIAGNOSIS — Z23 Encounter for immunization: Secondary | ICD-10-CM | POA: Diagnosis not present

## 2020-10-21 DIAGNOSIS — R600 Localized edema: Secondary | ICD-10-CM | POA: Diagnosis not present

## 2020-10-21 DIAGNOSIS — M546 Pain in thoracic spine: Secondary | ICD-10-CM | POA: Diagnosis not present

## 2020-10-21 DIAGNOSIS — M9902 Segmental and somatic dysfunction of thoracic region: Secondary | ICD-10-CM | POA: Diagnosis not present

## 2020-10-21 DIAGNOSIS — D649 Anemia, unspecified: Secondary | ICD-10-CM | POA: Diagnosis not present

## 2020-10-21 DIAGNOSIS — R231 Pallor: Secondary | ICD-10-CM | POA: Diagnosis not present

## 2020-10-21 DIAGNOSIS — Z79899 Other long term (current) drug therapy: Secondary | ICD-10-CM | POA: Diagnosis not present

## 2020-10-26 DIAGNOSIS — M9902 Segmental and somatic dysfunction of thoracic region: Secondary | ICD-10-CM | POA: Diagnosis not present

## 2020-10-26 DIAGNOSIS — M542 Cervicalgia: Secondary | ICD-10-CM | POA: Diagnosis not present

## 2020-10-26 DIAGNOSIS — M546 Pain in thoracic spine: Secondary | ICD-10-CM | POA: Diagnosis not present

## 2020-10-26 DIAGNOSIS — M9901 Segmental and somatic dysfunction of cervical region: Secondary | ICD-10-CM | POA: Diagnosis not present

## 2020-10-27 DIAGNOSIS — M542 Cervicalgia: Secondary | ICD-10-CM | POA: Diagnosis not present

## 2020-10-27 DIAGNOSIS — M9901 Segmental and somatic dysfunction of cervical region: Secondary | ICD-10-CM | POA: Diagnosis not present

## 2020-10-27 DIAGNOSIS — M546 Pain in thoracic spine: Secondary | ICD-10-CM | POA: Diagnosis not present

## 2020-10-27 DIAGNOSIS — M9902 Segmental and somatic dysfunction of thoracic region: Secondary | ICD-10-CM | POA: Diagnosis not present

## 2020-10-28 DIAGNOSIS — M542 Cervicalgia: Secondary | ICD-10-CM | POA: Diagnosis not present

## 2020-10-28 DIAGNOSIS — M546 Pain in thoracic spine: Secondary | ICD-10-CM | POA: Diagnosis not present

## 2020-10-28 DIAGNOSIS — M9902 Segmental and somatic dysfunction of thoracic region: Secondary | ICD-10-CM | POA: Diagnosis not present

## 2020-10-28 DIAGNOSIS — M9901 Segmental and somatic dysfunction of cervical region: Secondary | ICD-10-CM | POA: Diagnosis not present

## 2020-11-02 DIAGNOSIS — M542 Cervicalgia: Secondary | ICD-10-CM | POA: Diagnosis not present

## 2020-11-02 DIAGNOSIS — M546 Pain in thoracic spine: Secondary | ICD-10-CM | POA: Diagnosis not present

## 2020-11-02 DIAGNOSIS — M9901 Segmental and somatic dysfunction of cervical region: Secondary | ICD-10-CM | POA: Diagnosis not present

## 2020-11-02 DIAGNOSIS — M9902 Segmental and somatic dysfunction of thoracic region: Secondary | ICD-10-CM | POA: Diagnosis not present

## 2020-12-01 DIAGNOSIS — I739 Peripheral vascular disease, unspecified: Secondary | ICD-10-CM | POA: Diagnosis not present

## 2020-12-01 DIAGNOSIS — Z87891 Personal history of nicotine dependence: Secondary | ICD-10-CM | POA: Diagnosis not present

## 2020-12-01 DIAGNOSIS — I4891 Unspecified atrial fibrillation: Secondary | ICD-10-CM | POA: Diagnosis not present

## 2020-12-01 DIAGNOSIS — I10 Essential (primary) hypertension: Secondary | ICD-10-CM | POA: Diagnosis not present

## 2020-12-01 DIAGNOSIS — Z7901 Long term (current) use of anticoagulants: Secondary | ICD-10-CM | POA: Diagnosis not present

## 2020-12-07 DIAGNOSIS — M9901 Segmental and somatic dysfunction of cervical region: Secondary | ICD-10-CM | POA: Diagnosis not present

## 2020-12-07 DIAGNOSIS — M546 Pain in thoracic spine: Secondary | ICD-10-CM | POA: Diagnosis not present

## 2020-12-07 DIAGNOSIS — M9902 Segmental and somatic dysfunction of thoracic region: Secondary | ICD-10-CM | POA: Diagnosis not present

## 2020-12-07 DIAGNOSIS — M542 Cervicalgia: Secondary | ICD-10-CM | POA: Diagnosis not present

## 2020-12-08 DIAGNOSIS — M542 Cervicalgia: Secondary | ICD-10-CM | POA: Diagnosis not present

## 2020-12-08 DIAGNOSIS — M9902 Segmental and somatic dysfunction of thoracic region: Secondary | ICD-10-CM | POA: Diagnosis not present

## 2020-12-08 DIAGNOSIS — M9901 Segmental and somatic dysfunction of cervical region: Secondary | ICD-10-CM | POA: Diagnosis not present

## 2020-12-08 DIAGNOSIS — M546 Pain in thoracic spine: Secondary | ICD-10-CM | POA: Diagnosis not present

## 2020-12-23 DIAGNOSIS — D649 Anemia, unspecified: Secondary | ICD-10-CM | POA: Diagnosis not present

## 2020-12-23 DIAGNOSIS — Z79899 Other long term (current) drug therapy: Secondary | ICD-10-CM | POA: Diagnosis not present

## 2020-12-23 DIAGNOSIS — R231 Pallor: Secondary | ICD-10-CM | POA: Diagnosis not present

## 2020-12-23 DIAGNOSIS — R600 Localized edema: Secondary | ICD-10-CM | POA: Diagnosis not present

## 2020-12-23 DIAGNOSIS — E162 Hypoglycemia, unspecified: Secondary | ICD-10-CM | POA: Diagnosis not present

## 2020-12-23 DIAGNOSIS — E7800 Pure hypercholesterolemia, unspecified: Secondary | ICD-10-CM | POA: Diagnosis not present

## 2020-12-30 DIAGNOSIS — L570 Actinic keratosis: Secondary | ICD-10-CM | POA: Diagnosis not present

## 2020-12-30 DIAGNOSIS — Z85828 Personal history of other malignant neoplasm of skin: Secondary | ICD-10-CM | POA: Diagnosis not present

## 2021-01-13 DIAGNOSIS — M1990 Unspecified osteoarthritis, unspecified site: Secondary | ICD-10-CM | POA: Diagnosis not present

## 2021-01-13 DIAGNOSIS — M170 Bilateral primary osteoarthritis of knee: Secondary | ICD-10-CM | POA: Diagnosis not present

## 2021-01-13 DIAGNOSIS — I10 Essential (primary) hypertension: Secondary | ICD-10-CM | POA: Diagnosis not present

## 2021-01-13 DIAGNOSIS — I2510 Atherosclerotic heart disease of native coronary artery without angina pectoris: Secondary | ICD-10-CM | POA: Diagnosis not present

## 2021-01-13 DIAGNOSIS — I63512 Cerebral infarction due to unspecified occlusion or stenosis of left middle cerebral artery: Secondary | ICD-10-CM | POA: Diagnosis not present

## 2021-01-13 DIAGNOSIS — M159 Polyosteoarthritis, unspecified: Secondary | ICD-10-CM | POA: Diagnosis not present

## 2021-01-13 DIAGNOSIS — N183 Chronic kidney disease, stage 3 unspecified: Secondary | ICD-10-CM | POA: Diagnosis not present

## 2021-01-13 DIAGNOSIS — D649 Anemia, unspecified: Secondary | ICD-10-CM | POA: Diagnosis not present

## 2021-01-13 DIAGNOSIS — E7800 Pure hypercholesterolemia, unspecified: Secondary | ICD-10-CM | POA: Diagnosis not present

## 2021-01-13 DIAGNOSIS — I4891 Unspecified atrial fibrillation: Secondary | ICD-10-CM | POA: Diagnosis not present

## 2021-01-13 DIAGNOSIS — I119 Hypertensive heart disease without heart failure: Secondary | ICD-10-CM | POA: Diagnosis not present

## 2021-01-13 DIAGNOSIS — I701 Atherosclerosis of renal artery: Secondary | ICD-10-CM | POA: Diagnosis not present

## 2021-01-26 DIAGNOSIS — H6981 Other specified disorders of Eustachian tube, right ear: Secondary | ICD-10-CM | POA: Diagnosis not present

## 2021-01-26 DIAGNOSIS — I129 Hypertensive chronic kidney disease with stage 1 through stage 4 chronic kidney disease, or unspecified chronic kidney disease: Secondary | ICD-10-CM | POA: Diagnosis not present

## 2021-01-26 DIAGNOSIS — N1831 Chronic kidney disease, stage 3a: Secondary | ICD-10-CM | POA: Diagnosis not present

## 2021-01-26 DIAGNOSIS — J342 Deviated nasal septum: Secondary | ICD-10-CM | POA: Diagnosis not present

## 2021-01-26 DIAGNOSIS — I4891 Unspecified atrial fibrillation: Secondary | ICD-10-CM | POA: Diagnosis not present

## 2021-01-26 DIAGNOSIS — E785 Hyperlipidemia, unspecified: Secondary | ICD-10-CM | POA: Diagnosis not present

## 2021-01-26 DIAGNOSIS — J3489 Other specified disorders of nose and nasal sinuses: Secondary | ICD-10-CM | POA: Diagnosis not present

## 2021-01-26 DIAGNOSIS — R0982 Postnasal drip: Secondary | ICD-10-CM | POA: Diagnosis not present

## 2021-01-27 ENCOUNTER — Telehealth: Payer: PPO | Admitting: Cardiology

## 2021-02-02 DIAGNOSIS — H5203 Hypermetropia, bilateral: Secondary | ICD-10-CM | POA: Diagnosis not present

## 2021-02-02 DIAGNOSIS — H5053 Vertical heterophoria: Secondary | ICD-10-CM | POA: Diagnosis not present

## 2021-02-02 DIAGNOSIS — H524 Presbyopia: Secondary | ICD-10-CM | POA: Diagnosis not present

## 2021-02-02 DIAGNOSIS — Z961 Presence of intraocular lens: Secondary | ICD-10-CM | POA: Diagnosis not present

## 2021-02-02 DIAGNOSIS — H52223 Regular astigmatism, bilateral: Secondary | ICD-10-CM | POA: Diagnosis not present

## 2021-02-17 ENCOUNTER — Ambulatory Visit (HOSPITAL_BASED_OUTPATIENT_CLINIC_OR_DEPARTMENT_OTHER): Payer: PPO | Admitting: Family

## 2021-02-17 ENCOUNTER — Encounter (HOSPITAL_BASED_OUTPATIENT_CLINIC_OR_DEPARTMENT_OTHER): Payer: Self-pay | Admitting: Family

## 2021-02-17 ENCOUNTER — Other Ambulatory Visit: Payer: Self-pay

## 2021-02-17 VITALS — BP 128/64 | HR 78 | Ht 72.0 in | Wt 178.0 lb

## 2021-02-17 DIAGNOSIS — I4821 Permanent atrial fibrillation: Secondary | ICD-10-CM

## 2021-02-17 DIAGNOSIS — I25118 Atherosclerotic heart disease of native coronary artery with other forms of angina pectoris: Secondary | ICD-10-CM

## 2021-02-17 DIAGNOSIS — D6859 Other primary thrombophilia: Secondary | ICD-10-CM | POA: Diagnosis not present

## 2021-02-17 DIAGNOSIS — E785 Hyperlipidemia, unspecified: Secondary | ICD-10-CM

## 2021-02-17 NOTE — Patient Instructions (Signed)
Medication Instructions:  Continue your current medications.   *If you need a refill on your cardiac medications before your next appointment, please call your pharmacy*   Lab Work: None ordered today. Your labs in December looked great!  If you have labs (blood work) drawn today and your tests are completely normal, you will receive your results only by: [place] (if you have MyChart) OR A paper copy in the mail If you have any lab test that is abnormal or we need to change your treatment, we will call you to review the results.   Testing/Procedures: Your EKG today shows rate controlled atrial fibrillation.    Follow-Up: At [facility], you and your health needs are our priority.  As part of our continuing mission to provide you with exceptional heart care, we have created designated Provider Care Teams.  These Care Teams include your primary Cardiologist (physician) and Advanced Practice Providers (APPs -  Physician Assistants and Nurse Practitioners) who all work together to provide you with the care you need, when you need it.  We recommend signing up for the patient portal called "MyChart".  Sign up information is provided on this After Visit Summary.  MyChart is used to connect with patients for Virtual Visits (Telemedicine).  Patients are able to view lab/test results, encounter notes, upcoming appointments, etc.  Non-urgent messages can be sent to your provider as well.   To learn more about what you can do with MyChart, go to [email or web address].    Your next appointment:   1 year(s)  The format for your next appointment:   In Person  Provider:   Fransico Him, MD     Other Instructions  Heart Healthy Diet Recommendations: A low-salt diet is recommended. Meats should be grilled, baked, or boiled. Avoid fried foods. Focus on lean protein sources like fish or chicken with vegetables and fruits. The American Heart Association is a Microbiologist!  American  Heart Association Diet and Lifeystyle Recommendations    Exercise recommendations: The American Heart Association recommends 150 minutes of moderate intensity exercise weekly. Try 30 minutes of moderate intensity exercise 4-5 times per week. This could include walking, jogging, or swimming.

## 2021-02-17 NOTE — Progress Notes (Signed)
Office Visit    Patient Name: [name] Date of Encounter: 02/17/2021  PCP:  Josetta Huddle, MD   [facility]  Cardiologist:  Fransico Him, MD  Advanced Practice Provider:  No care team member to display Electrophysiologist:  None      Chief Complaint    [name] is a 81 y.o. male with a hx of CAD s/p CABG, hypertension, hyperlipidemia, persistent atrial fibrillation, carotid stenosis s/p left CEA, AAA s/p repair presents today for follow up of coronary disease and atrial fibrillation.    Past Medical History    Past Medical History:  Diagnosis Date   Arthritis    HANDS AND FEET   Asymptomatic carotid artery stenosis BILATERAL   PER DOPPLER  03/2016  RICA  1 - 39%/  s/p  L CEA  1-39%- followed by Dr. 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   Nocturia    Paroxysmal atrial fibrillation (HCC) 06/30/2014   chad2vasc score is at least 4   Popliteal artery aneurysm (HCC) LEFT   PVD (peripheral vascular disease) (HCC)    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   Retinal detachment    SBO (small bowel obstruction) (HCC)    Stroke (HCC)    mini stroke/2017   Past Surgical History:  Procedure Laterality Date   AAA REPAIR W/ AORTOBIFEMORAL BYPASS  OCT  00762   ABDOMINAL HERNIA REPAIR  X4  (LAST ONE 1990'S)   inguinal herniorrhaphy   CARDIAC CATHETERIZATION N/A 12/16/2015   Procedure: Left Heart Cath and Coronary Angiography;  Surgeon: Wellington Hampshire, MD;  Location: [facility];  Service: Cardiovascular;   Laterality: N/A;   CARPAL TUNNEL RELEASE Right 10/2011   Hand   CORONARY ANGIOPLASTY  2002   PTCA  W/ X2 BM STENTS--   PROXIMA LAD  &  LEFT CIRCUMFLEX TO FIRST OBTUSE MARGINAL BRANCH   CORONARY ARTERY BYPASS GRAFT N/A 12/18/2015   Procedure: CORONARY ARTERY BYPASS GRAFTING (CABG) x 3, using left internal mammary artery and bilateral   leg greater saphenous vein harvested endoscopically  LIMA to LAD, SVG to Circumflex, SVG to RCA;  Surgeon: Grace Isaac, MD;  Location: [facility];  Service: Open Heart Surgery;  Laterality: N/A;   CORONARY ARTERY BYPASS GRAFT     3 vessel   ENDARTERECTOMY Left 03/11/2015   Procedure: ENDARTERECTOMY LEFT CAROTID;  Surgeon: Rosetta Posner, MD;  Location: [facility];  Service: Vascular;  Laterality: Left;   EXPLORATORY LAPAROTOMY W/ ENTEROLYSIS FOR PARTIAL SBO  09/23/2008   heart stents  02/19/2000   HYDROCELE EXCISION  01/24/2011   Procedure: HYDROCELECTOMY ADULT;  Surgeon: Bernestine Amass, MD;  Location: [facility];  Service: Urology;  Laterality: Left;   Ingrown Toe Nail Right 01/2013   Right Great Toe nail   intest blockage  09/22/2008   PATCH ANGIOPLASTY Left 03/11/2015   Procedure: PATCH ANGIOPLASTY LEFT CAROTID USING HEMASHIELD PLATINUM FINESSE PATCH;  Surgeon: Sherren Mocha  Katina Dung, MD;  Location: [facility];  Service: Vascular;  Laterality: Left;   PULLEY RELEASE -- RIGHT 5TH FINGER  2009   RETINAL DETACHMENT SURGERY  2004   BILATERAL   TEE WITHOUT CARDIOVERSION N/A 12/18/2015   Procedure: TRANSESOPHAGEAL ECHOCARDIOGRAM (TEE);  Surgeon: Grace Isaac, MD;  Location: [facility];  Service: Open Heart Surgery;  Laterality: N/A;   TOOTH EXTRACTION  12/2011   TOTAL HIP ARTHROPLASTY Right 09/02/2020   Procedure: TOTAL HIP ARTHROPLASTY ANTERIOR APPROACH;  Surgeon: Gaynelle Arabian, MD;  Location: [facility];  Service: Orthopedics;  Laterality: Right;    Allergies  Allergies  Allergen Reactions   Wasp Venom Shortness Of Breath and Swelling   Ramipril Swelling     History of Present Illness    [name] is a 81 y.o. male with a hx of CAD s/p CABG, hypertension, hyperlipidemia, persistent atrial fibrillation, carotid stenosis s/p left CEA, AAA s/p repair  last seen 08/21/2020 pharmacy team for Lovenox teaching.  Amiodarone previously discontinued due to bradycardia.   He presents today for follow up. Tells me his wife is having cardiac catheterization later today and that she has mobility difficulties so he assists with caretaking. They are hoping to take a trip to [place] when she is in better health as they enjoy travelling. Tells me mobility somewhat improved since hip surgery in August but now needs left hip replacement which limites mobility. No chest pain. Notes exertional dyspnea with more than usual activity which is stable at baseline. Denies palpitations, orthopnea, edema.   EKGs/Labs/Other Studies Reviewed:   The following studies were reviewed today:    Echo 01/23/19 demonstrated  Left ventricular ejection fraction, by visual estimation, is 60 to 65%. The left ventricle has normal function. There is mildly increased left ventricular hypertrophy. 2. Left ventricular diastolic parameters are indeterminate. 3. Right ventricular volume/pressure overload. 4. The left ventricle has no regional wall motion abnormalities. 5. Global right ventricle has mildly reduced systolic function.The right ventricular size is mildly enlarged. No increase in right ventricular wall thickness. 6. Left atrial size was moderately dilated. 7. Right atrial size was moderately dilated. 8. The mitral valve is normal in structure. Mild mitral valve regurgitation. 9. The tricuspid valve is normal in structure. 10. The aortic valve is tricuspid. Aortic valve regurgitation is trivial. 11. The pulmonic valve was grossly normal. Pulmonic valve regurgitation is trivial. 12. Mildly elevated pulmonary artery systolic pressure. 13. The atrial septum is grossly  normal. 14. The average left ventricular global longitudinal strain is -19.8 %.  EKG:  EKG is  ordered today.  The ekg ordered today demonstrates rate controlled atrial fibrillation 78 bpm with LAFB and no acute ST/T wave changes. Recent Labs: 08/28/2020: ALT 28 09/04/2020: BUN 16; Creatinine, Ser 0.94; Hemoglobin 9.1; Platelets 107; Potassium 3.9; Sodium 140  Recent Lipid Panel    Component Value Date/Time   CHOL 139 01/15/2020 0846   CHOL 164 05/24/2013 0916   TRIG 69 01/15/2020 0846   TRIG 50 05/24/2013 0916   HDL 52 01/15/2020 0846   HDL 84 05/24/2013 0916   CHOLHDL 2.7 01/15/2020 0846   CHOLHDL 2.4 12/16/2015 0218   VLDL 15 12/16/2015 0218   LDLCALC 73 01/15/2020 0846   LDLCALC 70 05/24/2013 0916    Risk Assessment/Calculations:   CHA2DS2-VASc Score = 6   This indicates a 9.7% annual risk of stroke. The patient's score is based upon: CHF History: 0 HTN History: 1 Diabetes History: 0 Stroke History: 2 Vascular Disease History:  1 Age Score: 2 Gender Score: 0   Home Medications   Current Meds  Medication Sig   acetaminophen (TYLENOL) 500 MG tablet Take 2 tablets (1,000 mg total) by mouth every 6 (six) hours as needed. (Patient taking differently: Take 1,000 mg by mouth every 6 (six) hours as needed for moderate pain or headache.)   apixaban (ELIQUIS) 5 MG TABS tablet Take 1 tablet (5 mg total) by mouth 2 (two) times daily.   Apoaequorin (PREVAGEN) 10 MG CAPS Take 10 mg by mouth daily.   Artificial Tear Solution (SOOTHE XP) SOLN Place 1 drop into both eyes daily.   b complex vitamins tablet Take 1 tablet by mouth daily.   calcium carbonate (OS-CAL) 600 MG TABS Take 600 mg by mouth daily.   Cholecalciferol 1000 UNITS capsule Take 1,000 Units by mouth daily.   Coenzyme Q10 (CO Q 10) 100 MG CAPS Take 300 mg by mouth daily.   cyanocobalamin 1000 MCG tablet Take 1,000 mcg by mouth daily.   EPINEPHrine 0.3 mg/0.3 mL IJ SOAJ injection Inject 0.3 mg into the muscle daily as  needed for anaphylaxis (allergic reaction).    finasteride (PROSCAR) 5 MG tablet Take 5 mg by mouth at bedtime.   hydrALAZINE (APRESOLINE) 25 MG tablet Take 25-50 mg by mouth See admin instructions. Take 25 mg in the morning and 50 mg in the evening   Magnesium Hydroxide (MAGNESIA PO) Take 500-1,000 mg by mouth See admin instructions. Take 500 mg in the morning 1000 mg at bedtime   Multiple Vitamin (MULTIVITAMIN) tablet Take 1 tablet by mouth daily.   Omega-3 Fatty Acids (FISH OIL PO) Take 1,200 mg by mouth 2 (two) times daily.   potassium gluconate 595 (99 K) MG TABS tablet Take 99 mg by mouth daily.   rosuvastatin (CRESTOR) 40 MG tablet Take 1 tablet (40 mg total) by mouth every evening.   Tamsulosin HCl (FLOMAX) 0.4 MG CAPS Take 0.8 mg by mouth at bedtime.   vitamin C (ASCORBIC ACID) 500 MG tablet Take 500 mg by mouth daily.   zinc gluconate 50 MG tablet Take 50 mg by mouth daily.     Review of Systems      All other systems reviewed and are otherwise negative except as noted above.  Physical Exam    VS:  BP 128/64    Pulse 78    Ht 6' (1.829 m)    Wt 178 lb (80.7 kg)    BMI 24.14 kg/m  , BMI Body mass index is 24.14 kg/m.  Wt Readings from Last 3 Encounters:  02/17/21 178 lb (80.7 kg)  09/02/20 182 lb (82.6 kg)  08/28/20 182 lb (82.6 kg)     GEN: Well nourished, well developed, in no acute distress. HEENT: normal. Neck: Supple, no JVD, carotid bruits, or masses. Cardiac: irregularly irregular, no murmurs, rubs, or gallops. No clubbing, cyanosis, edema.  Radials/PT 2+ and equal bilaterally.  Respiratory:  Respirations regular and unlabored, clear to auscultation bilaterally. GI: Soft, nontender, nondistended. MS: No deformity or atrophy. Skin: Warm and dry, no rash. Neuro:  Strength and sensation are intact. Psych: Normal affect.  Assessment & Plan    Permanent atrial fibrillation - Rate controlled today. Denies palpitations. AV nodal blocking agent and Amiodarone  previously discontinued due to bradycardia. CHA2DS2-VASc Score = 6 [CHF History: 0, HTN History: 1, Diabetes History: 0, Stroke History: 2, Vascular Disease History: 1, Age Score: 2, Gender Score: 0].  Therefore, the patient's annual risk of stroke is  9.7 %.    Continue Eliquis 5mg  BID. Does not meet dose reduction criteria. Denies bleeding complications.   CAD s/p CABG - Stable with no anginal symptoms. No indication for ischemic evaluation.  GDMT includes Rosuvastatin. No aspirin due to chronic anticoagulation. No BB due to bradycardia.   HLD - Continue Rosuvastatin 40mg  QD. 12/2019 LDL 81. Discussed LDL goal <70 given coronary disease. He is not interested in additional medical therapy. Lifestyle changes encouraged.   HTN - BP well controlled. Continue current antihypertensive regimen.    Carotid artery stenosis / AAA s/p repair- Follows annually with Dr. Donnetta Hutching.  Nocturnal bradycardia - Sleep study with no OSA. Asymptomatic. No longer on Amiodarone or AV nodal blocking hterapy.   Disposition: Follow up in 1 year(s) with Fransico Him, MD or APP.  Signed, Loel Dubonnet, NP 02/17/2021, 9:20 AM [place]

## 2021-03-05 DIAGNOSIS — Z96641 Presence of right artificial hip joint: Secondary | ICD-10-CM | POA: Diagnosis not present

## 2021-03-19 DIAGNOSIS — M25551 Pain in right hip: Secondary | ICD-10-CM | POA: Diagnosis not present

## 2021-03-23 DIAGNOSIS — M25551 Pain in right hip: Secondary | ICD-10-CM | POA: Diagnosis not present

## 2021-03-26 DIAGNOSIS — M25551 Pain in right hip: Secondary | ICD-10-CM | POA: Diagnosis not present

## 2021-03-30 DIAGNOSIS — M25551 Pain in right hip: Secondary | ICD-10-CM | POA: Diagnosis not present

## 2021-04-02 DIAGNOSIS — M25551 Pain in right hip: Secondary | ICD-10-CM | POA: Diagnosis not present

## 2021-04-06 DIAGNOSIS — M25551 Pain in right hip: Secondary | ICD-10-CM | POA: Diagnosis not present

## 2021-04-09 DIAGNOSIS — M25551 Pain in right hip: Secondary | ICD-10-CM | POA: Diagnosis not present

## 2021-04-13 DIAGNOSIS — M25551 Pain in right hip: Secondary | ICD-10-CM | POA: Diagnosis not present

## 2021-04-16 DIAGNOSIS — M25551 Pain in right hip: Secondary | ICD-10-CM | POA: Diagnosis not present

## 2021-04-28 ENCOUNTER — Ambulatory Visit: Payer: PPO | Admitting: Podiatry

## 2021-04-28 ENCOUNTER — Encounter: Payer: Self-pay | Admitting: Podiatry

## 2021-04-28 ENCOUNTER — Ambulatory Visit (INDEPENDENT_AMBULATORY_CARE_PROVIDER_SITE_OTHER): Payer: PPO

## 2021-04-28 DIAGNOSIS — M205X1 Other deformities of toe(s) (acquired), right foot: Secondary | ICD-10-CM | POA: Diagnosis not present

## 2021-04-28 DIAGNOSIS — L84 Corns and callosities: Secondary | ICD-10-CM

## 2021-04-28 DIAGNOSIS — M2041 Other hammer toe(s) (acquired), right foot: Secondary | ICD-10-CM

## 2021-04-28 DIAGNOSIS — Q828 Other specified congenital malformations of skin: Secondary | ICD-10-CM | POA: Diagnosis not present

## 2021-04-28 NOTE — Progress Notes (Signed)
Subjective:  ? ?Patient ID: [name], male   DOB: 81 y.o.   MRN: [id number]  ? ?HPI ?Patient presents after having hip surgery and heart surgery and not been seen for around 3 years with inflammation of the first metatarsal head right that is developed callus formation and is painful to walk.  Has old orthotics which are not addressing this particular issue.  Patient does not smoke likes to be active ? ? ?Review of Systems  ?All other systems reviewed and are negative. ? ? ?   ?Objective:  ?Physical Exam ?Vitals and nursing note reviewed.  ?Constitutional:   ?   Appearance: He is well-developed.  ?Pulmonary:  ?   Effort: Pulmonary effort is normal.  ?Musculoskeletal:     ?   General: Normal range of motion.  ?Skin: ?   General: Skin is warm.  ?Neurological:  ?   Mental Status: He is alert.  ?  ?Neurovascular status intact muscle strength found to be within normal limits with patient having mild diminishment of PT DP pulses but intact.  Patient has rigidly can tract and first MPJ right with plantar inflammation keratotic tissue with rigid first metatarsal plantarflexion right foot.  Good digital perfusion well oriented x3 ? ?   ?Assessment:  ?Probability that this is stress on the first metatarsal head right secondary to rigid contracture and loss of motion first MPJ ? ?   ?Plan:  ?H&P x-rays reviewed condition discussed and I do think a new orthotic with a deep pocket around the first metatarsal right would be best.  He does have old orthotics I do not think those will work for what he needs but they may be recovered as a second pair.  Patient will see Aaron Edelman and he will casted him and I do think again offloading the first metatarsal head right will be beneficial and today I debrided the callus along with applying metatarsal dancers pad to reduce pressure ? 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   ? ? ?

## 2021-05-04 ENCOUNTER — Ambulatory Visit: Payer: PPO

## 2021-05-04 DIAGNOSIS — M2041 Other hammer toe(s) (acquired), right foot: Secondary | ICD-10-CM

## 2021-05-04 DIAGNOSIS — M205X1 Other deformities of toe(s) (acquired), right foot: Secondary | ICD-10-CM

## 2021-05-04 NOTE — Progress Notes (Signed)
SITUATION ?Reason for Consult: Evaluation for Bilateral Custom Foot Orthoses ?Patient / Caregiver Report: Patient is ready for foot orthotics ? ?OBJECTIVE DATA: ?Patient History / Diagnosis:  ?  ICD-10-CM   ?1. 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  ?2. 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  ? ? ?Current or Previous Devices:   Historical ? ?Foot Examination: ?Skin presentation:   Compromised ?Ulcers & Callousing:   Callus of 1st met ?Toe / Foot Deformities:  Hammertoes ?Weight Bearing Presentation:  Rectus ?Sensation:    Intact ? ?Shoe Size:    13XW ? ?ORTHOTIC RECOMMENDATION ?Recommended Device: 1x pair of custom functional foot orthotics ? ?GOALS OF ORTHOSES ?- Reduce Pain ?- Prevent Foot Deformity ?- Prevent Progression of Further Foot Deformity ?- Relieve Pressure ?- Improve the Overall Biomechanical Function of the Foot and Lower Extremity. ? ?ACTIONS PERFORMED ?Potential out of pocket cost was communicated to patient. Patient understood and consent to casting. Patient was casted for Foot Orthoses via crush box. Procedure was explained and patient tolerated procedure well. Casts were shipped to central fabrication. All questions were answered and concerns addressed. ? ?PLAN ?Patient is to be called for fitting when devices are ready.  ? ? ?

## 2021-05-12 ENCOUNTER — Telehealth: Payer: Self-pay

## 2021-05-12 ENCOUNTER — Other Ambulatory Visit: Payer: Self-pay

## 2021-05-12 DIAGNOSIS — I779 Disorder of arteries and arterioles, unspecified: Secondary | ICD-10-CM

## 2021-05-12 DIAGNOSIS — I6523 Occlusion and stenosis of bilateral carotid arteries: Secondary | ICD-10-CM

## 2021-05-12 NOTE — Telephone Encounter (Signed)
Patient called into office stating he has prolonged swelling of the RLE. Patient denies rest pains or walking pains. He requested to be seen sooner for reassurance. Pts appt moved to sooner date with Dr. Donnetta Hutching. ?

## 2021-05-18 DIAGNOSIS — I480 Paroxysmal atrial fibrillation: Secondary | ICD-10-CM | POA: Diagnosis not present

## 2021-05-18 DIAGNOSIS — I10 Essential (primary) hypertension: Secondary | ICD-10-CM | POA: Diagnosis not present

## 2021-05-26 ENCOUNTER — Ambulatory Visit (INDEPENDENT_AMBULATORY_CARE_PROVIDER_SITE_OTHER)
Admission: RE | Admit: 2021-05-26 | Discharge: 2021-05-26 | Disposition: A | Discharge status: Home / Self Care | Payer: PPO | Source: Ambulatory Visit | Attending: Vascular Surgery | Admitting: Vascular Surgery

## 2021-05-26 ENCOUNTER — Ambulatory Visit (HOSPITAL_COMMUNITY)
Admission: RE | Admit: 2021-05-26 | Discharge: 2021-05-26 | Disposition: A | Discharge status: Home / Self Care | Payer: PPO | Source: Ambulatory Visit | Attending: Vascular Surgery | Admitting: Vascular Surgery

## 2021-05-26 ENCOUNTER — Ambulatory Visit (INDEPENDENT_AMBULATORY_CARE_PROVIDER_SITE_OTHER): Payer: PPO

## 2021-05-26 DIAGNOSIS — L84 Corns and callosities: Secondary | ICD-10-CM

## 2021-05-26 DIAGNOSIS — M2041 Other hammer toe(s) (acquired), right foot: Secondary | ICD-10-CM

## 2021-05-26 DIAGNOSIS — M205X1 Other deformities of toe(s) (acquired), right foot: Secondary | ICD-10-CM | POA: Diagnosis not present

## 2021-05-26 DIAGNOSIS — I6523 Occlusion and stenosis of bilateral carotid arteries: Secondary | ICD-10-CM | POA: Insufficient documentation

## 2021-05-26 DIAGNOSIS — I779 Disorder of arteries and arterioles, unspecified: Secondary | ICD-10-CM

## 2021-05-26 NOTE — Progress Notes (Signed)
SITUATION: ?Reason for Visit: Fitting and Delivery of Custom Fabricated Foot Orthoses ?Patient Report: Patient reports comfort and is satisfied with device. ? ?OBJECTIVE DATA: ?Patient History / Diagnosis:   ?  ICD-10-CM   ?1. 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  ?2. 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  ?3. Corns and callosities  L84   ?  ? ? ?Provided Device:  Custom Functional Foot Orthotics ?    [facility]: [id number] ? ?GOAL OF ORTHOSIS ?- Improve gait ?- Decrease energy expenditure ?- Improve Balance ?- Provide Triplanar stability of foot complex ?- Facilitate motion ? ?ACTIONS PERFORMED ?Patient was fit with foot orthotics trimmed to shoe last. Patient tolerated fittign procedure.  ? ?Patient was provided with verbal and written instruction and demonstration regarding donning, doffing, wear, care, proper fit, function, purpose, cleaning, and use of the orthosis and in all related precautions and risks and benefits regarding the orthosis. ? ?Patient was also provided with verbal instruction regarding how to report any failures or malfunctions of the orthosis and necessary follow up care. Patient was also instructed to contact our office regarding any change in status that may affect the function of the orthosis. ? ?Patient demonstrated independence with proper donning, doffing, and fit and verbalized understanding of all instructions. ? ?PLAN: ?Patient is to follow up in one week or as necessary (PRN). All questions were answered and concerns addressed. Plan of care was discussed with and agreed upon by the patient. ? ?

## 2021-06-01 ENCOUNTER — Ambulatory Visit: Payer: PPO

## 2021-06-01 DIAGNOSIS — M2041 Other hammer toe(s) (acquired), right foot: Secondary | ICD-10-CM

## 2021-06-01 DIAGNOSIS — M205X1 Other deformities of toe(s) (acquired), right foot: Secondary | ICD-10-CM

## 2021-06-01 NOTE — Progress Notes (Signed)
SITUATION ?Reason for Consult: Follow-up with foot orthotics ?Patient / Caregiver Report: Patient would like metpad added to right foot orthotic ? ?OBJECTIVE DATA ?History / Diagnosis:  ?  ICD-10-CM   ?1. 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  ?2. 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  ? ? ?Change in Pathology: None ? ?ACTIONS PERFORMED ?Patient's equipment was checked for structural stability and fit. Devices returned to [employer] for [employer] addition.All questions answered and concerns addressed. ? ?PLAN ?Patient to return in six weeks or sooner if possible. Plan of care discussed with and agreed upon by patient / caregiver. ? ?

## 2021-06-02 ENCOUNTER — Encounter: Payer: Self-pay | Admitting: Vascular Surgery

## 2021-06-02 ENCOUNTER — Ambulatory Visit: Payer: PPO | Admitting: Vascular Surgery

## 2021-06-02 VITALS — BP 150/82 | HR 66 | Temp 98.2°F | Ht 72.0 in | Wt 183.0 lb

## 2021-06-02 DIAGNOSIS — I739 Peripheral vascular disease, unspecified: Secondary | ICD-10-CM

## 2021-06-02 DIAGNOSIS — I779 Disorder of arteries and arterioles, unspecified: Secondary | ICD-10-CM | POA: Diagnosis not present

## 2021-06-02 DIAGNOSIS — I6523 Occlusion and stenosis of bilateral carotid arteries: Secondary | ICD-10-CM

## 2021-06-02 NOTE — Progress Notes (Signed)
? ? [facility] [place] ? ?Patient name: [name] MRN: [id number] DOB: October 22, 1940 Sex: male ? ?REASON FOR VISIT: Follow-up diffuse peripheral vascular disease ? ?HPI: ?[name] is a 81 y.o. male here today for follow-up.  He he looks good today.  He reports mainly orthopedic issues are limiting to him.  He does not have any new neurologic deficits.  He has been stable from a cardiac standpoint.  Does have intermittent claudication in his right calf but this has remained stable.  He does have progressive difficulty from an orthopedic standpoint in his right knee and is scheduled to see his orthopedic surgeon in 1 month regarding this.  He has no lower extremity tissue loss.  He has mild swelling in his feet.  He does have issues with hammertoes with no tissue loss and is having inserts to help with this as well. ? ?He is status post aortobifemoral bypass in 2005 and also underwent left carotid endarterectomy at the time of coronary bypass grafting in 2016 ? ?Past Medical History:  ?Diagnosis Date  ? Arthritis   ? HANDS AND FEET  ? Asymptomatic carotid artery stenosis BILATERAL  ? PER DOPPLER  03/2016  RICA  1 - 39%/  s/p  L CEA  1-39%- followed by Dr. Donnetta Hutching  ? CAD (coronary artery disease)   ? s/p remote stenting, s/p CABG with LIMA to LAD, SVG to RCA and SVG to LCx 12/2015  ? Cataract immature BILATERAL   ? Diverticulosis   ? History of AAA (abdominal aortic aneurysm) repair 2000  ? W/ AORTOVIFEMORAL BYPASS  ? History of diverticulosis   ? Noted on Colonoscopy  ? History of inguinal herniorrhaphy   ? Hydrocele, left   ? Hyperlipidemia   ? Hypertension   ? Increased intraocular pressure   ? Lumbar degenerative disc disease 03/2017  ? Nocturia   ? Paroxysmal atrial fibrillation ([place]) 06/30/2014  ? chad2vasc score is at least 4  ? Popliteal artery aneurysm (HCC) LEFT  ? PVD (peripheral vascular disease) ([place])   ? 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  Retinal detachment   ? SBO (small bowel obstruction) ([place])   ? Stroke [facility])   ? mini stroke/2017  ? ? ?Family History  ?Problem Relation Age of Onset  ? Heart disease Mother   ?     AAA  Before age 42  ? Cancer Mother 26  ?     Colon cancer  ? Hyperlipidemia Mother   ? Heart failure Father   ? ? ?SOCIAL HISTORY: ?Social History  ? ?Tobacco Use  ? Smoking status: Former  ?  Packs/day: 1.00  ?  Years: 30.00  ?  Pack years: 30.00  ?  Types: Cigarettes  ?  Quit date: 01/18/1978  ?  Years since quitting: 43.4  ?  Passive exposure: Never  ? Smokeless tobacco: Never  ?Substance Use Topics  ? Alcohol use: Not Currently  ?  Alcohol/week: 2.0 standard drinks  ?  Types: 2 Shots of liquor per week  ?  Comment: 2 oz of vodka per day  ? ? ?Allergies  ?Allergen Reactions  ? Wasp Venom Shortness Of Breath and Swelling  ? Ramipril Swelling  ? ? ?Current Outpatient Medications  ?Medication Sig Dispense Refill  ? acetaminophen (TYLENOL) 500 MG tablet Take 2 tablets (1,000 mg total) by mouth every 6 (six) hours as needed. (Patient taking differently: Take 1,000 mg by mouth every 6 (six) hours as needed for moderate  pain or headache.) 30 tablet 0  ? apixaban (ELIQUIS) 5 MG TABS tablet Take 1 tablet (5 mg total) by mouth 2 (two) times daily. 60 tablet 6  ? Apoaequorin (PREVAGEN) 10 MG CAPS Take 10 mg by mouth daily.    ? Artificial Tear Solution (SOOTHE XP) SOLN Place 1 drop into both eyes daily.    ? b complex vitamins tablet Take 1 tablet by mouth daily.    ? calcium carbonate (OS-CAL) 600 MG TABS Take 600 mg by mouth daily.    ? Cholecalciferol 1000 UNITS capsule Take 1,000 Units by mouth daily.    ? Coenzyme Q10 (CO Q 10) 100 MG CAPS Take 300 mg by mouth daily.    ? cyanocobalamin 1000 MCG tablet Take 1,000 mcg by mouth daily.    ? EPINEPHrine 0.3 mg/0.3 mL IJ SOAJ injection Inject 0.3 mg into the muscle daily as needed for anaphylaxis (allergic reaction).     ? finasteride (PROSCAR) 5 MG tablet Take 5 mg by mouth at bedtime.    ?  hydrALAZINE (APRESOLINE) 25 MG tablet Take 25-50 mg by mouth See admin instructions. Take 25 mg in the morning and 50 mg in the evening    ? losartan (COZAAR) 50 MG tablet Take 1 tablet (50 mg total) by mouth daily. 90 tablet 3  ? Magnesium Hydroxide (MAGNESIA PO) Take 500-1,000 mg by mouth See admin instructions. Take 500 mg in the morning 1000 mg at bedtime    ? Multiple Vitamin (MULTIVITAMIN) tablet Take 1 tablet by mouth daily.    ? Omega-3 Fatty Acids (FISH OIL PO) Take 1,200 mg by mouth 2 (two) times daily.    ? potassium gluconate 595 (99 K) MG TABS tablet Take 99 mg by mouth daily.    ? rosuvastatin (CRESTOR) 40 MG tablet Take 1 tablet (40 mg total) by mouth every evening. 90 tablet 3  ? Tamsulosin HCl (FLOMAX) 0.4 MG CAPS Take 0.8 mg by mouth at bedtime.    ? vitamin C (ASCORBIC ACID) 500 MG tablet Take 500 mg by mouth daily.    ? zinc gluconate 50 MG tablet Take 50 mg by mouth daily.    ? ?No current facility-administered medications for this visit.  ? ? ?REVIEW OF SYSTEMS:  ?'[X]'$  denotes positive finding, '[ ]'$  denotes negative finding ?Cardiac  Comments:  ?Chest pain or chest pressure:    ?Shortness of breath upon exertion:    ?Short of breath when lying flat:    ?Irregular heart rhythm:    ?    ?Vascular    ?Pain in calf, thigh, or hip brought on by ambulation: x   ?Pain in feet at night that wakes you up from your sleep:     ?Blood clot in your veins:    ?Leg swelling:     ?    ? ? ?PHYSICAL EXAM: ?Vitals:  ? 06/02/21 1117  ?BP: (!) 150/82  ?Pulse: 66  ?Temp: 98.2 ?F (36.8 ?C)  ?TempSrc: Temporal  ?SpO2: 99%  ?Weight: 183 lb (83 kg)  ?Height: 6' (1.829 m)  ? ? ?GENERAL: The patient is a well-nourished male, in no acute distress. The vital signs are documented above. ?CARDIOVASCULAR: Carotid arteries without bruits bilaterally.  Well-healed left neck incision.  2+ femoral pulses.  I do not palpate popliteal or distal pulses. ?PULMONARY: There is good air exchange  ?MUSCULOSKELETAL: There are no major  deformities or cyanosis. ?NEUROLOGIC: No focal weakness or paresthesias are detected. ?SKIN: There are no ulcers  or rashes noted. ?PSYCHIATRIC: The patient has a normal affect. ? ?DATA:  ?Noninvasive studies from 05/26/2021 were reviewed with the patient.  This revealed no evidence of significant carotid disease bilaterally. 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  This is unchanged from CT scan from 10 years ago ? ?MEDICAL ISSUES: ?Stable overall.  He does have claudication on the right which is tolerable.  He does have main limitations of orthopedic issues.  He will notify should he develop any tissue loss.  Otherwise we will see him 1 year with repeat carotid duplex and ankle arm indices ? ? ? ?Rosetta Posner, MD FACS ?[facility] [place] ?Office Tel [phone] ? ?Note: Portions of this report may have been transcribed using voice recognition software.  Every effort has been made to ensure accuracy; however, inadvertent computerized transcription errors may still be present. ?

## 2021-06-10 DIAGNOSIS — M9902 Segmental and somatic dysfunction of thoracic region: Secondary | ICD-10-CM | POA: Diagnosis not present

## 2021-06-10 DIAGNOSIS — M9901 Segmental and somatic dysfunction of cervical region: Secondary | ICD-10-CM | POA: Diagnosis not present

## 2021-06-10 DIAGNOSIS — M542 Cervicalgia: Secondary | ICD-10-CM | POA: Diagnosis not present

## 2021-06-10 DIAGNOSIS — M546 Pain in thoracic spine: Secondary | ICD-10-CM | POA: Diagnosis not present

## 2021-06-11 DIAGNOSIS — M542 Cervicalgia: Secondary | ICD-10-CM | POA: Diagnosis not present

## 2021-06-11 DIAGNOSIS — M546 Pain in thoracic spine: Secondary | ICD-10-CM | POA: Diagnosis not present

## 2021-06-11 DIAGNOSIS — M9901 Segmental and somatic dysfunction of cervical region: Secondary | ICD-10-CM | POA: Diagnosis not present

## 2021-06-11 DIAGNOSIS — M9902 Segmental and somatic dysfunction of thoracic region: Secondary | ICD-10-CM | POA: Diagnosis not present

## 2021-06-15 DIAGNOSIS — M542 Cervicalgia: Secondary | ICD-10-CM | POA: Diagnosis not present

## 2021-06-15 DIAGNOSIS — M9901 Segmental and somatic dysfunction of cervical region: Secondary | ICD-10-CM | POA: Diagnosis not present

## 2021-06-15 DIAGNOSIS — M546 Pain in thoracic spine: Secondary | ICD-10-CM | POA: Diagnosis not present

## 2021-06-15 DIAGNOSIS — M9902 Segmental and somatic dysfunction of thoracic region: Secondary | ICD-10-CM | POA: Diagnosis not present

## 2021-06-17 DIAGNOSIS — M9902 Segmental and somatic dysfunction of thoracic region: Secondary | ICD-10-CM | POA: Diagnosis not present

## 2021-06-17 DIAGNOSIS — M546 Pain in thoracic spine: Secondary | ICD-10-CM | POA: Diagnosis not present

## 2021-06-17 DIAGNOSIS — M542 Cervicalgia: Secondary | ICD-10-CM | POA: Diagnosis not present

## 2021-06-17 DIAGNOSIS — M9901 Segmental and somatic dysfunction of cervical region: Secondary | ICD-10-CM | POA: Diagnosis not present

## 2021-06-21 DIAGNOSIS — M546 Pain in thoracic spine: Secondary | ICD-10-CM | POA: Diagnosis not present

## 2021-06-21 DIAGNOSIS — M542 Cervicalgia: Secondary | ICD-10-CM | POA: Diagnosis not present

## 2021-06-21 DIAGNOSIS — M9902 Segmental and somatic dysfunction of thoracic region: Secondary | ICD-10-CM | POA: Diagnosis not present

## 2021-06-21 DIAGNOSIS — M9901 Segmental and somatic dysfunction of cervical region: Secondary | ICD-10-CM | POA: Diagnosis not present

## 2021-06-22 ENCOUNTER — Ambulatory Visit: Payer: PPO

## 2021-06-22 DIAGNOSIS — M2041 Other hammer toe(s) (acquired), right foot: Secondary | ICD-10-CM

## 2021-06-22 DIAGNOSIS — M205X1 Other deformities of toe(s) (acquired), right foot: Secondary | ICD-10-CM

## 2021-06-22 NOTE — Progress Notes (Signed)
SITUATION: Reason for Visit: Fitting and Delivery of Custom Fabricated Foot Orthoses Patient Report: Patient reports comfort and is satisfied with device.  OBJECTIVE DATA: Patient History / Diagnosis:     ICD-10-CM   1. Hammertoe of right foot  M20.41     2. Hallux limitus, right  M20.5X1       Provided Device:  Custom Functional Foot Orthotics     [facility]: [id number] - Adjust  GOAL OF ORTHOSIS - Improve gait - Decrease energy expenditure - Improve Balance - Provide Triplanar stability of foot complex - Facilitate motion  ACTIONS PERFORMED Patient was fit with foot orthotics trimmed to shoe last. Patient tolerated fittign procedure.   Patient was provided with verbal and written instruction and demonstration regarding donning, doffing, wear, care, proper fit, function, purpose, cleaning, and use of the orthosis and in all related precautions and risks and benefits regarding the orthosis.  Patient was also provided with verbal instruction regarding how to report any failures or malfunctions of the orthosis and necessary follow up care. Patient was also instructed to contact our office regarding any change in status that may affect the function of the orthosis.  Patient demonstrated independence with proper donning, doffing, and fit and verbalized understanding of all instructions.  PLAN: Patient is to follow up in one week or as necessary (PRN). All questions were answered and concerns addressed. Plan of care was discussed with and agreed upon by the patient.

## 2021-06-23 DIAGNOSIS — M9901 Segmental and somatic dysfunction of cervical region: Secondary | ICD-10-CM | POA: Diagnosis not present

## 2021-06-23 DIAGNOSIS — M546 Pain in thoracic spine: Secondary | ICD-10-CM | POA: Diagnosis not present

## 2021-06-23 DIAGNOSIS — M542 Cervicalgia: Secondary | ICD-10-CM | POA: Diagnosis not present

## 2021-06-23 DIAGNOSIS — M9902 Segmental and somatic dysfunction of thoracic region: Secondary | ICD-10-CM | POA: Diagnosis not present

## 2021-06-28 DIAGNOSIS — M9902 Segmental and somatic dysfunction of thoracic region: Secondary | ICD-10-CM | POA: Diagnosis not present

## 2021-06-28 DIAGNOSIS — M9901 Segmental and somatic dysfunction of cervical region: Secondary | ICD-10-CM | POA: Diagnosis not present

## 2021-06-28 DIAGNOSIS — M542 Cervicalgia: Secondary | ICD-10-CM | POA: Diagnosis not present

## 2021-06-28 DIAGNOSIS — M546 Pain in thoracic spine: Secondary | ICD-10-CM | POA: Diagnosis not present

## 2021-06-30 DIAGNOSIS — M542 Cervicalgia: Secondary | ICD-10-CM | POA: Diagnosis not present

## 2021-06-30 DIAGNOSIS — M546 Pain in thoracic spine: Secondary | ICD-10-CM | POA: Diagnosis not present

## 2021-06-30 DIAGNOSIS — M9902 Segmental and somatic dysfunction of thoracic region: Secondary | ICD-10-CM | POA: Diagnosis not present

## 2021-06-30 DIAGNOSIS — M9901 Segmental and somatic dysfunction of cervical region: Secondary | ICD-10-CM | POA: Diagnosis not present

## 2021-07-05 DIAGNOSIS — M542 Cervicalgia: Secondary | ICD-10-CM | POA: Diagnosis not present

## 2021-07-05 DIAGNOSIS — M9901 Segmental and somatic dysfunction of cervical region: Secondary | ICD-10-CM | POA: Diagnosis not present

## 2021-07-05 DIAGNOSIS — M9902 Segmental and somatic dysfunction of thoracic region: Secondary | ICD-10-CM | POA: Diagnosis not present

## 2021-07-05 DIAGNOSIS — M546 Pain in thoracic spine: Secondary | ICD-10-CM | POA: Diagnosis not present

## 2021-07-07 DIAGNOSIS — M542 Cervicalgia: Secondary | ICD-10-CM | POA: Diagnosis not present

## 2021-07-07 DIAGNOSIS — L814 Other melanin hyperpigmentation: Secondary | ICD-10-CM | POA: Diagnosis not present

## 2021-07-07 DIAGNOSIS — D692 Other nonthrombocytopenic purpura: Secondary | ICD-10-CM | POA: Diagnosis not present

## 2021-07-07 DIAGNOSIS — Z85828 Personal history of other malignant neoplasm of skin: Secondary | ICD-10-CM | POA: Diagnosis not present

## 2021-07-07 DIAGNOSIS — M9901 Segmental and somatic dysfunction of cervical region: Secondary | ICD-10-CM | POA: Diagnosis not present

## 2021-07-07 DIAGNOSIS — L570 Actinic keratosis: Secondary | ICD-10-CM | POA: Diagnosis not present

## 2021-07-07 DIAGNOSIS — L821 Other seborrheic keratosis: Secondary | ICD-10-CM | POA: Diagnosis not present

## 2021-07-07 DIAGNOSIS — M546 Pain in thoracic spine: Secondary | ICD-10-CM | POA: Diagnosis not present

## 2021-07-07 DIAGNOSIS — M9902 Segmental and somatic dysfunction of thoracic region: Secondary | ICD-10-CM | POA: Diagnosis not present

## 2021-07-09 DIAGNOSIS — M25561 Pain in right knee: Secondary | ICD-10-CM | POA: Diagnosis not present

## 2021-07-09 DIAGNOSIS — Z96641 Presence of right artificial hip joint: Secondary | ICD-10-CM | POA: Diagnosis not present

## 2021-07-12 DIAGNOSIS — M546 Pain in thoracic spine: Secondary | ICD-10-CM | POA: Diagnosis not present

## 2021-07-12 DIAGNOSIS — M9901 Segmental and somatic dysfunction of cervical region: Secondary | ICD-10-CM | POA: Diagnosis not present

## 2021-07-12 DIAGNOSIS — M542 Cervicalgia: Secondary | ICD-10-CM | POA: Diagnosis not present

## 2021-07-12 DIAGNOSIS — M9902 Segmental and somatic dysfunction of thoracic region: Secondary | ICD-10-CM | POA: Diagnosis not present

## 2021-07-13 ENCOUNTER — Other Ambulatory Visit: Payer: PPO

## 2021-07-13 DIAGNOSIS — N1831 Chronic kidney disease, stage 3a: Secondary | ICD-10-CM | POA: Diagnosis not present

## 2021-07-13 DIAGNOSIS — D649 Anemia, unspecified: Secondary | ICD-10-CM | POA: Diagnosis not present

## 2021-07-13 DIAGNOSIS — I701 Atherosclerosis of renal artery: Secondary | ICD-10-CM | POA: Diagnosis not present

## 2021-07-13 DIAGNOSIS — Z23 Encounter for immunization: Secondary | ICD-10-CM | POA: Diagnosis not present

## 2021-07-13 DIAGNOSIS — N183 Chronic kidney disease, stage 3 unspecified: Secondary | ICD-10-CM | POA: Diagnosis not present

## 2021-07-13 DIAGNOSIS — E559 Vitamin D deficiency, unspecified: Secondary | ICD-10-CM | POA: Diagnosis not present

## 2021-07-13 DIAGNOSIS — Z1331 Encounter for screening for depression: Secondary | ICD-10-CM | POA: Diagnosis not present

## 2021-07-13 DIAGNOSIS — M25561 Pain in right knee: Secondary | ICD-10-CM | POA: Diagnosis not present

## 2021-07-13 DIAGNOSIS — D6869 Other thrombophilia: Secondary | ICD-10-CM | POA: Diagnosis not present

## 2021-07-13 DIAGNOSIS — I4891 Unspecified atrial fibrillation: Secondary | ICD-10-CM | POA: Diagnosis not present

## 2021-07-13 DIAGNOSIS — Z8673 Personal history of transient ischemic attack (TIA), and cerebral infarction without residual deficits: Secondary | ICD-10-CM | POA: Diagnosis not present

## 2021-07-13 DIAGNOSIS — I10 Essential (primary) hypertension: Secondary | ICD-10-CM | POA: Diagnosis not present

## 2021-07-13 DIAGNOSIS — I119 Hypertensive heart disease without heart failure: Secondary | ICD-10-CM | POA: Diagnosis not present

## 2021-07-13 DIAGNOSIS — R202 Paresthesia of skin: Secondary | ICD-10-CM | POA: Diagnosis not present

## 2021-07-13 DIAGNOSIS — M5416 Radiculopathy, lumbar region: Secondary | ICD-10-CM | POA: Diagnosis not present

## 2021-07-13 DIAGNOSIS — Z79899 Other long term (current) drug therapy: Secondary | ICD-10-CM | POA: Diagnosis not present

## 2021-07-13 DIAGNOSIS — Z0000 Encounter for general adult medical examination without abnormal findings: Secondary | ICD-10-CM | POA: Diagnosis not present

## 2021-07-13 DIAGNOSIS — M270 Developmental disorders of jaws: Secondary | ICD-10-CM | POA: Diagnosis not present

## 2021-07-13 DIAGNOSIS — E7800 Pure hypercholesterolemia, unspecified: Secondary | ICD-10-CM | POA: Diagnosis not present

## 2021-07-14 DIAGNOSIS — M9902 Segmental and somatic dysfunction of thoracic region: Secondary | ICD-10-CM | POA: Diagnosis not present

## 2021-07-14 DIAGNOSIS — M9901 Segmental and somatic dysfunction of cervical region: Secondary | ICD-10-CM | POA: Diagnosis not present

## 2021-07-14 DIAGNOSIS — M546 Pain in thoracic spine: Secondary | ICD-10-CM | POA: Diagnosis not present

## 2021-07-14 DIAGNOSIS — M542 Cervicalgia: Secondary | ICD-10-CM | POA: Diagnosis not present

## 2021-07-19 DIAGNOSIS — M9902 Segmental and somatic dysfunction of thoracic region: Secondary | ICD-10-CM | POA: Diagnosis not present

## 2021-07-19 DIAGNOSIS — M546 Pain in thoracic spine: Secondary | ICD-10-CM | POA: Diagnosis not present

## 2021-07-19 DIAGNOSIS — M9901 Segmental and somatic dysfunction of cervical region: Secondary | ICD-10-CM | POA: Diagnosis not present

## 2021-07-19 DIAGNOSIS — M542 Cervicalgia: Secondary | ICD-10-CM | POA: Diagnosis not present

## 2021-07-21 ENCOUNTER — Other Ambulatory Visit: Payer: Self-pay | Admitting: Internal Medicine

## 2021-07-21 DIAGNOSIS — M5416 Radiculopathy, lumbar region: Secondary | ICD-10-CM

## 2021-07-22 DIAGNOSIS — M542 Cervicalgia: Secondary | ICD-10-CM | POA: Diagnosis not present

## 2021-07-22 DIAGNOSIS — M9901 Segmental and somatic dysfunction of cervical region: Secondary | ICD-10-CM | POA: Diagnosis not present

## 2021-07-22 DIAGNOSIS — M546 Pain in thoracic spine: Secondary | ICD-10-CM | POA: Diagnosis not present

## 2021-07-22 DIAGNOSIS — M9902 Segmental and somatic dysfunction of thoracic region: Secondary | ICD-10-CM | POA: Diagnosis not present

## 2021-07-23 DIAGNOSIS — M13841 Other specified arthritis, right hand: Secondary | ICD-10-CM | POA: Diagnosis not present

## 2021-07-23 DIAGNOSIS — M79644 Pain in right finger(s): Secondary | ICD-10-CM | POA: Diagnosis not present

## 2021-07-24 DIAGNOSIS — M25561 Pain in right knee: Secondary | ICD-10-CM | POA: Diagnosis not present

## 2021-07-26 DIAGNOSIS — M9901 Segmental and somatic dysfunction of cervical region: Secondary | ICD-10-CM | POA: Diagnosis not present

## 2021-07-26 DIAGNOSIS — M542 Cervicalgia: Secondary | ICD-10-CM | POA: Diagnosis not present

## 2021-07-26 DIAGNOSIS — M9902 Segmental and somatic dysfunction of thoracic region: Secondary | ICD-10-CM | POA: Diagnosis not present

## 2021-07-26 DIAGNOSIS — M546 Pain in thoracic spine: Secondary | ICD-10-CM | POA: Diagnosis not present

## 2021-07-28 DIAGNOSIS — M542 Cervicalgia: Secondary | ICD-10-CM | POA: Diagnosis not present

## 2021-07-28 DIAGNOSIS — M9901 Segmental and somatic dysfunction of cervical region: Secondary | ICD-10-CM | POA: Diagnosis not present

## 2021-07-28 DIAGNOSIS — M546 Pain in thoracic spine: Secondary | ICD-10-CM | POA: Diagnosis not present

## 2021-07-28 DIAGNOSIS — M9902 Segmental and somatic dysfunction of thoracic region: Secondary | ICD-10-CM | POA: Diagnosis not present

## 2021-08-02 DIAGNOSIS — M546 Pain in thoracic spine: Secondary | ICD-10-CM | POA: Diagnosis not present

## 2021-08-02 DIAGNOSIS — M542 Cervicalgia: Secondary | ICD-10-CM | POA: Diagnosis not present

## 2021-08-02 DIAGNOSIS — M9901 Segmental and somatic dysfunction of cervical region: Secondary | ICD-10-CM | POA: Diagnosis not present

## 2021-08-02 DIAGNOSIS — M9902 Segmental and somatic dysfunction of thoracic region: Secondary | ICD-10-CM | POA: Diagnosis not present

## 2021-08-03 ENCOUNTER — Ambulatory Visit
Admission: RE | Admit: 2021-08-03 | Discharge: 2021-08-03 | Disposition: A | Discharge status: Home / Self Care | Payer: PPO | Source: Ambulatory Visit | Attending: Internal Medicine | Admitting: Internal Medicine

## 2021-08-03 DIAGNOSIS — M48061 Spinal stenosis, lumbar region without neurogenic claudication: Secondary | ICD-10-CM | POA: Diagnosis not present

## 2021-08-03 DIAGNOSIS — M5416 Radiculopathy, lumbar region: Secondary | ICD-10-CM

## 2021-08-03 DIAGNOSIS — M545 Low back pain, unspecified: Secondary | ICD-10-CM | POA: Diagnosis not present

## 2021-08-03 DIAGNOSIS — R29898 Other symptoms and signs involving the musculoskeletal system: Secondary | ICD-10-CM | POA: Diagnosis not present

## 2021-08-04 DIAGNOSIS — M9901 Segmental and somatic dysfunction of cervical region: Secondary | ICD-10-CM | POA: Diagnosis not present

## 2021-08-04 DIAGNOSIS — M9902 Segmental and somatic dysfunction of thoracic region: Secondary | ICD-10-CM | POA: Diagnosis not present

## 2021-08-04 DIAGNOSIS — M546 Pain in thoracic spine: Secondary | ICD-10-CM | POA: Diagnosis not present

## 2021-08-04 DIAGNOSIS — M542 Cervicalgia: Secondary | ICD-10-CM | POA: Diagnosis not present

## 2021-08-09 DIAGNOSIS — M542 Cervicalgia: Secondary | ICD-10-CM | POA: Diagnosis not present

## 2021-08-09 DIAGNOSIS — M9901 Segmental and somatic dysfunction of cervical region: Secondary | ICD-10-CM | POA: Diagnosis not present

## 2021-08-09 DIAGNOSIS — M546 Pain in thoracic spine: Secondary | ICD-10-CM | POA: Diagnosis not present

## 2021-08-09 DIAGNOSIS — M9902 Segmental and somatic dysfunction of thoracic region: Secondary | ICD-10-CM | POA: Diagnosis not present

## 2021-08-10 DIAGNOSIS — R29898 Other symptoms and signs involving the musculoskeletal system: Secondary | ICD-10-CM | POA: Diagnosis not present

## 2021-08-11 DIAGNOSIS — M9902 Segmental and somatic dysfunction of thoracic region: Secondary | ICD-10-CM | POA: Diagnosis not present

## 2021-08-11 DIAGNOSIS — M546 Pain in thoracic spine: Secondary | ICD-10-CM | POA: Diagnosis not present

## 2021-08-11 DIAGNOSIS — M542 Cervicalgia: Secondary | ICD-10-CM | POA: Diagnosis not present

## 2021-08-11 DIAGNOSIS — M9901 Segmental and somatic dysfunction of cervical region: Secondary | ICD-10-CM | POA: Diagnosis not present

## 2021-08-17 DIAGNOSIS — I119 Hypertensive heart disease without heart failure: Secondary | ICD-10-CM | POA: Diagnosis not present

## 2021-08-17 DIAGNOSIS — N183 Chronic kidney disease, stage 3 unspecified: Secondary | ICD-10-CM | POA: Diagnosis not present

## 2021-08-17 DIAGNOSIS — I4891 Unspecified atrial fibrillation: Secondary | ICD-10-CM | POA: Diagnosis not present

## 2021-08-17 DIAGNOSIS — I10 Essential (primary) hypertension: Secondary | ICD-10-CM | POA: Diagnosis not present

## 2021-08-17 DIAGNOSIS — N400 Enlarged prostate without lower urinary tract symptoms: Secondary | ICD-10-CM | POA: Diagnosis not present

## 2021-08-17 DIAGNOSIS — E7800 Pure hypercholesterolemia, unspecified: Secondary | ICD-10-CM | POA: Diagnosis not present

## 2021-08-17 DIAGNOSIS — M159 Polyosteoarthritis, unspecified: Secondary | ICD-10-CM | POA: Diagnosis not present

## 2021-08-23 DIAGNOSIS — M542 Cervicalgia: Secondary | ICD-10-CM | POA: Diagnosis not present

## 2021-08-23 DIAGNOSIS — M546 Pain in thoracic spine: Secondary | ICD-10-CM | POA: Diagnosis not present

## 2021-08-23 DIAGNOSIS — M9902 Segmental and somatic dysfunction of thoracic region: Secondary | ICD-10-CM | POA: Diagnosis not present

## 2021-08-23 DIAGNOSIS — M9901 Segmental and somatic dysfunction of cervical region: Secondary | ICD-10-CM | POA: Diagnosis not present

## 2021-08-24 DIAGNOSIS — M542 Cervicalgia: Secondary | ICD-10-CM | POA: Diagnosis not present

## 2021-08-24 DIAGNOSIS — M9902 Segmental and somatic dysfunction of thoracic region: Secondary | ICD-10-CM | POA: Diagnosis not present

## 2021-08-24 DIAGNOSIS — M9901 Segmental and somatic dysfunction of cervical region: Secondary | ICD-10-CM | POA: Diagnosis not present

## 2021-08-24 DIAGNOSIS — M546 Pain in thoracic spine: Secondary | ICD-10-CM | POA: Diagnosis not present

## 2021-08-25 DIAGNOSIS — M542 Cervicalgia: Secondary | ICD-10-CM | POA: Diagnosis not present

## 2021-08-25 DIAGNOSIS — M9902 Segmental and somatic dysfunction of thoracic region: Secondary | ICD-10-CM | POA: Diagnosis not present

## 2021-08-25 DIAGNOSIS — M546 Pain in thoracic spine: Secondary | ICD-10-CM | POA: Diagnosis not present

## 2021-08-25 DIAGNOSIS — M25511 Pain in right shoulder: Secondary | ICD-10-CM | POA: Diagnosis not present

## 2021-08-25 DIAGNOSIS — M25512 Pain in left shoulder: Secondary | ICD-10-CM | POA: Diagnosis not present

## 2021-08-25 DIAGNOSIS — M9901 Segmental and somatic dysfunction of cervical region: Secondary | ICD-10-CM | POA: Diagnosis not present

## 2021-08-30 DIAGNOSIS — M9902 Segmental and somatic dysfunction of thoracic region: Secondary | ICD-10-CM | POA: Diagnosis not present

## 2021-08-30 DIAGNOSIS — M542 Cervicalgia: Secondary | ICD-10-CM | POA: Diagnosis not present

## 2021-08-30 DIAGNOSIS — M9901 Segmental and somatic dysfunction of cervical region: Secondary | ICD-10-CM | POA: Diagnosis not present

## 2021-08-30 DIAGNOSIS — M546 Pain in thoracic spine: Secondary | ICD-10-CM | POA: Diagnosis not present

## 2021-09-06 DIAGNOSIS — M9902 Segmental and somatic dysfunction of thoracic region: Secondary | ICD-10-CM | POA: Diagnosis not present

## 2021-09-06 DIAGNOSIS — M542 Cervicalgia: Secondary | ICD-10-CM | POA: Diagnosis not present

## 2021-09-06 DIAGNOSIS — M546 Pain in thoracic spine: Secondary | ICD-10-CM | POA: Diagnosis not present

## 2021-09-06 DIAGNOSIS — M9901 Segmental and somatic dysfunction of cervical region: Secondary | ICD-10-CM | POA: Diagnosis not present

## 2021-09-08 ENCOUNTER — Ambulatory Visit (INDEPENDENT_AMBULATORY_CARE_PROVIDER_SITE_OTHER): Payer: PPO | Admitting: Podiatry

## 2021-09-08 DIAGNOSIS — L84 Corns and callosities: Secondary | ICD-10-CM

## 2021-09-08 DIAGNOSIS — M205X1 Other deformities of toe(s) (acquired), right foot: Secondary | ICD-10-CM

## 2021-09-08 DIAGNOSIS — M79604 Pain in right leg: Secondary | ICD-10-CM | POA: Diagnosis not present

## 2021-09-09 NOTE — Progress Notes (Signed)
Subjective:   Patient ID: [name], male   DOB: 81 y.o.   MRN: [id number]   HPI Patient states his orthotics are wrong again right   ROS      Objective:  Physical Exam  Orthotics have a large metatarsal pad which is not something the patient can tolerate with chronic sublesion second metatarsal     Assessment:  Chronic bone structure second metatarsal right that were trying to offload     Plan:  Recommended removal of padding and I went ahead and recasted him today and we will try to make an orthotic that works better for him

## 2021-09-10 DIAGNOSIS — R29898 Other symptoms and signs involving the musculoskeletal system: Secondary | ICD-10-CM | POA: Diagnosis not present

## 2021-09-13 DIAGNOSIS — M9901 Segmental and somatic dysfunction of cervical region: Secondary | ICD-10-CM | POA: Diagnosis not present

## 2021-09-13 DIAGNOSIS — M542 Cervicalgia: Secondary | ICD-10-CM | POA: Diagnosis not present

## 2021-09-13 DIAGNOSIS — M9902 Segmental and somatic dysfunction of thoracic region: Secondary | ICD-10-CM | POA: Diagnosis not present

## 2021-09-13 DIAGNOSIS — M546 Pain in thoracic spine: Secondary | ICD-10-CM | POA: Diagnosis not present

## 2021-09-21 ENCOUNTER — Telehealth: Payer: Self-pay | Admitting: Podiatry

## 2021-09-21 NOTE — Telephone Encounter (Signed)
Spoke with patient he is going to swing by and pick up orthotics - left at front desk  NJM

## 2021-09-22 DIAGNOSIS — M9902 Segmental and somatic dysfunction of thoracic region: Secondary | ICD-10-CM | POA: Diagnosis not present

## 2021-09-22 DIAGNOSIS — M546 Pain in thoracic spine: Secondary | ICD-10-CM | POA: Diagnosis not present

## 2021-09-22 DIAGNOSIS — M542 Cervicalgia: Secondary | ICD-10-CM | POA: Diagnosis not present

## 2021-09-22 DIAGNOSIS — M9901 Segmental and somatic dysfunction of cervical region: Secondary | ICD-10-CM | POA: Diagnosis not present

## 2021-10-05 DIAGNOSIS — H938X1 Other specified disorders of right ear: Secondary | ICD-10-CM | POA: Diagnosis not present

## 2021-10-05 DIAGNOSIS — M26621 Arthralgia of right temporomandibular joint: Secondary | ICD-10-CM | POA: Diagnosis not present

## 2021-10-08 ENCOUNTER — Encounter (INDEPENDENT_AMBULATORY_CARE_PROVIDER_SITE_OTHER): Payer: PPO | Admitting: Ophthalmology

## 2021-10-08 DIAGNOSIS — H43813 Vitreous degeneration, bilateral: Secondary | ICD-10-CM | POA: Diagnosis not present

## 2021-10-08 DIAGNOSIS — H35033 Hypertensive retinopathy, bilateral: Secondary | ICD-10-CM | POA: Diagnosis not present

## 2021-10-08 DIAGNOSIS — H338 Other retinal detachments: Secondary | ICD-10-CM

## 2021-10-08 DIAGNOSIS — I10 Essential (primary) hypertension: Secondary | ICD-10-CM | POA: Diagnosis not present

## 2021-10-08 DIAGNOSIS — H33302 Unspecified retinal break, left eye: Secondary | ICD-10-CM

## 2021-10-11 DIAGNOSIS — M9902 Segmental and somatic dysfunction of thoracic region: Secondary | ICD-10-CM | POA: Diagnosis not present

## 2021-10-11 DIAGNOSIS — M542 Cervicalgia: Secondary | ICD-10-CM | POA: Diagnosis not present

## 2021-10-11 DIAGNOSIS — M9901 Segmental and somatic dysfunction of cervical region: Secondary | ICD-10-CM | POA: Diagnosis not present

## 2021-10-11 DIAGNOSIS — M546 Pain in thoracic spine: Secondary | ICD-10-CM | POA: Diagnosis not present

## 2021-10-13 DIAGNOSIS — M546 Pain in thoracic spine: Secondary | ICD-10-CM | POA: Diagnosis not present

## 2021-10-13 DIAGNOSIS — M9901 Segmental and somatic dysfunction of cervical region: Secondary | ICD-10-CM | POA: Diagnosis not present

## 2021-10-13 DIAGNOSIS — M9902 Segmental and somatic dysfunction of thoracic region: Secondary | ICD-10-CM | POA: Diagnosis not present

## 2021-10-13 DIAGNOSIS — M542 Cervicalgia: Secondary | ICD-10-CM | POA: Diagnosis not present

## 2021-10-20 DIAGNOSIS — M546 Pain in thoracic spine: Secondary | ICD-10-CM | POA: Diagnosis not present

## 2021-10-20 DIAGNOSIS — M542 Cervicalgia: Secondary | ICD-10-CM | POA: Diagnosis not present

## 2021-10-20 DIAGNOSIS — M9901 Segmental and somatic dysfunction of cervical region: Secondary | ICD-10-CM | POA: Diagnosis not present

## 2021-10-20 DIAGNOSIS — M9902 Segmental and somatic dysfunction of thoracic region: Secondary | ICD-10-CM | POA: Diagnosis not present

## 2021-10-21 DIAGNOSIS — M542 Cervicalgia: Secondary | ICD-10-CM | POA: Diagnosis not present

## 2021-10-21 DIAGNOSIS — M9901 Segmental and somatic dysfunction of cervical region: Secondary | ICD-10-CM | POA: Diagnosis not present

## 2021-10-21 DIAGNOSIS — M546 Pain in thoracic spine: Secondary | ICD-10-CM | POA: Diagnosis not present

## 2021-10-21 DIAGNOSIS — M9902 Segmental and somatic dysfunction of thoracic region: Secondary | ICD-10-CM | POA: Diagnosis not present

## 2021-10-22 DIAGNOSIS — M25561 Pain in right knee: Secondary | ICD-10-CM | POA: Diagnosis not present

## 2021-10-22 DIAGNOSIS — Z96641 Presence of right artificial hip joint: Secondary | ICD-10-CM | POA: Diagnosis not present

## 2021-10-22 DIAGNOSIS — M25461 Effusion, right knee: Secondary | ICD-10-CM | POA: Diagnosis not present

## 2021-10-25 DIAGNOSIS — M546 Pain in thoracic spine: Secondary | ICD-10-CM | POA: Diagnosis not present

## 2021-10-25 DIAGNOSIS — M9901 Segmental and somatic dysfunction of cervical region: Secondary | ICD-10-CM | POA: Diagnosis not present

## 2021-10-25 DIAGNOSIS — M9902 Segmental and somatic dysfunction of thoracic region: Secondary | ICD-10-CM | POA: Diagnosis not present

## 2021-10-25 DIAGNOSIS — M542 Cervicalgia: Secondary | ICD-10-CM | POA: Diagnosis not present

## 2021-10-27 DIAGNOSIS — M9901 Segmental and somatic dysfunction of cervical region: Secondary | ICD-10-CM | POA: Diagnosis not present

## 2021-10-27 DIAGNOSIS — M9902 Segmental and somatic dysfunction of thoracic region: Secondary | ICD-10-CM | POA: Diagnosis not present

## 2021-10-27 DIAGNOSIS — M542 Cervicalgia: Secondary | ICD-10-CM | POA: Diagnosis not present

## 2021-10-27 DIAGNOSIS — M546 Pain in thoracic spine: Secondary | ICD-10-CM | POA: Diagnosis not present

## 2021-11-01 DIAGNOSIS — M9902 Segmental and somatic dysfunction of thoracic region: Secondary | ICD-10-CM | POA: Diagnosis not present

## 2021-11-01 DIAGNOSIS — M9901 Segmental and somatic dysfunction of cervical region: Secondary | ICD-10-CM | POA: Diagnosis not present

## 2021-11-01 DIAGNOSIS — M542 Cervicalgia: Secondary | ICD-10-CM | POA: Diagnosis not present

## 2021-11-01 DIAGNOSIS — M546 Pain in thoracic spine: Secondary | ICD-10-CM | POA: Diagnosis not present

## 2021-11-03 DIAGNOSIS — M542 Cervicalgia: Secondary | ICD-10-CM | POA: Diagnosis not present

## 2021-11-03 DIAGNOSIS — M546 Pain in thoracic spine: Secondary | ICD-10-CM | POA: Diagnosis not present

## 2021-11-03 DIAGNOSIS — M9902 Segmental and somatic dysfunction of thoracic region: Secondary | ICD-10-CM | POA: Diagnosis not present

## 2021-11-03 DIAGNOSIS — M9901 Segmental and somatic dysfunction of cervical region: Secondary | ICD-10-CM | POA: Diagnosis not present

## 2021-11-08 DIAGNOSIS — M9902 Segmental and somatic dysfunction of thoracic region: Secondary | ICD-10-CM | POA: Diagnosis not present

## 2021-11-08 DIAGNOSIS — M546 Pain in thoracic spine: Secondary | ICD-10-CM | POA: Diagnosis not present

## 2021-11-08 DIAGNOSIS — M542 Cervicalgia: Secondary | ICD-10-CM | POA: Diagnosis not present

## 2021-11-08 DIAGNOSIS — M9901 Segmental and somatic dysfunction of cervical region: Secondary | ICD-10-CM | POA: Diagnosis not present

## 2021-11-10 DIAGNOSIS — M542 Cervicalgia: Secondary | ICD-10-CM | POA: Diagnosis not present

## 2021-11-10 DIAGNOSIS — M546 Pain in thoracic spine: Secondary | ICD-10-CM | POA: Diagnosis not present

## 2021-11-10 DIAGNOSIS — M9902 Segmental and somatic dysfunction of thoracic region: Secondary | ICD-10-CM | POA: Diagnosis not present

## 2021-11-10 DIAGNOSIS — M9901 Segmental and somatic dysfunction of cervical region: Secondary | ICD-10-CM | POA: Diagnosis not present

## 2021-11-12 DIAGNOSIS — I739 Peripheral vascular disease, unspecified: Secondary | ICD-10-CM | POA: Diagnosis not present

## 2021-11-12 DIAGNOSIS — E785 Hyperlipidemia, unspecified: Secondary | ICD-10-CM | POA: Diagnosis not present

## 2021-11-22 DIAGNOSIS — M542 Cervicalgia: Secondary | ICD-10-CM | POA: Diagnosis not present

## 2021-11-22 DIAGNOSIS — M546 Pain in thoracic spine: Secondary | ICD-10-CM | POA: Diagnosis not present

## 2021-11-22 DIAGNOSIS — M9901 Segmental and somatic dysfunction of cervical region: Secondary | ICD-10-CM | POA: Diagnosis not present

## 2021-11-22 DIAGNOSIS — M9902 Segmental and somatic dysfunction of thoracic region: Secondary | ICD-10-CM | POA: Diagnosis not present

## 2021-12-21 DIAGNOSIS — M9902 Segmental and somatic dysfunction of thoracic region: Secondary | ICD-10-CM | POA: Diagnosis not present

## 2021-12-21 DIAGNOSIS — M9901 Segmental and somatic dysfunction of cervical region: Secondary | ICD-10-CM | POA: Diagnosis not present

## 2021-12-21 DIAGNOSIS — M542 Cervicalgia: Secondary | ICD-10-CM | POA: Diagnosis not present

## 2021-12-21 DIAGNOSIS — M546 Pain in thoracic spine: Secondary | ICD-10-CM | POA: Diagnosis not present

## 2021-12-22 DIAGNOSIS — M9901 Segmental and somatic dysfunction of cervical region: Secondary | ICD-10-CM | POA: Diagnosis not present

## 2021-12-22 DIAGNOSIS — M9902 Segmental and somatic dysfunction of thoracic region: Secondary | ICD-10-CM | POA: Diagnosis not present

## 2021-12-22 DIAGNOSIS — M542 Cervicalgia: Secondary | ICD-10-CM | POA: Diagnosis not present

## 2021-12-22 DIAGNOSIS — M546 Pain in thoracic spine: Secondary | ICD-10-CM | POA: Diagnosis not present

## 2021-12-27 DIAGNOSIS — M9902 Segmental and somatic dysfunction of thoracic region: Secondary | ICD-10-CM | POA: Diagnosis not present

## 2021-12-27 DIAGNOSIS — M546 Pain in thoracic spine: Secondary | ICD-10-CM | POA: Diagnosis not present

## 2021-12-27 DIAGNOSIS — M542 Cervicalgia: Secondary | ICD-10-CM | POA: Diagnosis not present

## 2021-12-27 DIAGNOSIS — M9901 Segmental and somatic dysfunction of cervical region: Secondary | ICD-10-CM | POA: Diagnosis not present

## 2021-12-29 DIAGNOSIS — R0989 Other specified symptoms and signs involving the circulatory and respiratory systems: Secondary | ICD-10-CM | POA: Diagnosis not present

## 2021-12-29 DIAGNOSIS — I4891 Unspecified atrial fibrillation: Secondary | ICD-10-CM | POA: Diagnosis not present

## 2021-12-29 DIAGNOSIS — R252 Cramp and spasm: Secondary | ICD-10-CM | POA: Diagnosis not present

## 2021-12-29 DIAGNOSIS — Z8673 Personal history of transient ischemic attack (TIA), and cerebral infarction without residual deficits: Secondary | ICD-10-CM | POA: Diagnosis not present

## 2021-12-29 DIAGNOSIS — I2510 Atherosclerotic heart disease of native coronary artery without angina pectoris: Secondary | ICD-10-CM | POA: Diagnosis not present

## 2021-12-29 DIAGNOSIS — I10 Essential (primary) hypertension: Secondary | ICD-10-CM | POA: Diagnosis not present

## 2022-01-05 DIAGNOSIS — L814 Other melanin hyperpigmentation: Secondary | ICD-10-CM | POA: Diagnosis not present

## 2022-01-05 DIAGNOSIS — C44319 Basal cell carcinoma of skin of other parts of face: Secondary | ICD-10-CM | POA: Diagnosis not present

## 2022-01-05 DIAGNOSIS — L570 Actinic keratosis: Secondary | ICD-10-CM | POA: Diagnosis not present

## 2022-01-05 DIAGNOSIS — D692 Other nonthrombocytopenic purpura: Secondary | ICD-10-CM | POA: Diagnosis not present

## 2022-01-05 DIAGNOSIS — D485 Neoplasm of uncertain behavior of skin: Secondary | ICD-10-CM | POA: Diagnosis not present

## 2022-01-05 DIAGNOSIS — Z85828 Personal history of other malignant neoplasm of skin: Secondary | ICD-10-CM | POA: Diagnosis not present

## 2022-01-18 ENCOUNTER — Telehealth: Payer: Self-pay | Admitting: Cardiology

## 2022-01-18 NOTE — Telephone Encounter (Signed)
Patient is calling wanting to switch providers from Dr. Radford Pax to Dr. Irish Lack. He states he knows Dr. Irish Lack and would feel more comfortable seeing him moving forward. Please advise.

## 2022-01-19 NOTE — Telephone Encounter (Signed)
OK with me.

## 2022-01-26 DIAGNOSIS — M25461 Effusion, right knee: Secondary | ICD-10-CM | POA: Diagnosis not present

## 2022-02-02 DIAGNOSIS — M25561 Pain in right knee: Secondary | ICD-10-CM | POA: Diagnosis not present

## 2022-02-08 DIAGNOSIS — C44319 Basal cell carcinoma of skin of other parts of face: Secondary | ICD-10-CM | POA: Diagnosis not present

## 2022-02-08 DIAGNOSIS — Z85828 Personal history of other malignant neoplasm of skin: Secondary | ICD-10-CM | POA: Diagnosis not present

## 2022-02-18 DIAGNOSIS — Z96649 Presence of unspecified artificial hip joint: Secondary | ICD-10-CM | POA: Diagnosis not present

## 2022-02-18 DIAGNOSIS — S83241D Other tear of medial meniscus, current injury, right knee, subsequent encounter: Secondary | ICD-10-CM | POA: Diagnosis not present

## 2022-03-21 DIAGNOSIS — M546 Pain in thoracic spine: Secondary | ICD-10-CM | POA: Diagnosis not present

## 2022-03-21 DIAGNOSIS — M9902 Segmental and somatic dysfunction of thoracic region: Secondary | ICD-10-CM | POA: Diagnosis not present

## 2022-03-21 DIAGNOSIS — M9901 Segmental and somatic dysfunction of cervical region: Secondary | ICD-10-CM | POA: Diagnosis not present

## 2022-03-21 DIAGNOSIS — M542 Cervicalgia: Secondary | ICD-10-CM | POA: Diagnosis not present

## 2022-04-13 DIAGNOSIS — M1712 Unilateral primary osteoarthritis, left knee: Secondary | ICD-10-CM | POA: Diagnosis not present

## 2022-04-13 DIAGNOSIS — M1711 Unilateral primary osteoarthritis, right knee: Secondary | ICD-10-CM | POA: Diagnosis not present

## 2022-04-19 NOTE — Progress Notes (Unsigned)
Cardiology Office Note   Date:  04/20/2022   ID:  [name], [name] August 31, 1940, MRN [id number]  PCP:  Corliss Blacker, MD    No chief complaint on file.  CAD  Wt Readings from Last 3 Encounters:  04/20/22 182 lb (82.6 kg)  06/02/21 183 lb (83 kg)  02/17/21 178 lb (80.7 kg)       History of Present Illness: [name] is a 82 y.o. male  with a hx of CAD (s/p CABG with LIMA to LAD, SVG to RCA and SVG to LCx 12/2015), HTN, dyslipidemia, PAF on chronic anticoagulation with Eliquis, carotid artery stenosis s/p  L CEA, AAA s/p repair and followed by Dr. Donnetta Hutching.  He has a hx of nocturnal bradycardia and nonsustained atrial tachycardia and sleep study was recommended by  Dr. Curt Bears.  He underwent PSG on 01/06/2019 and showed no significant OSA with an AHI of 0.2/hr with no significant oxygen desaturations.    Amiodarone previously discontinued due to bradycardia.   I saw him in the hospital in 2017 at the time of CAG for his left main disease.    He wants to review his meds.   Palpitations were worse with alcohol- he quit a year and half and palpitations have resolved essentially.  RIght Hip replacement in 2022.  Getting gel shots on right knee.  Has left hip arthritis.    Denies : Chest pain. Dizziness. Leg edema. Nitroglycerin use. Orthopnea.  Paroxysmal nocturnal dyspnea. Shortness of breath. Syncope.    Follows with Dr. Donnetta Hutching.   Plays golf 2x/2week. Uses a cart.   Past Medical History:  Diagnosis Date   Arthritis    HANDS AND FEET   Asymptomatic carotid artery stenosis BILATERAL   PER DOPPLER  03/2016  RICA  1 - 39%/  s/p  L CEA  1-39%- followed by Dr. 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   History of diverticulosis    Noted on Colonoscopy    History of inguinal herniorrhaphy    Hydrocele, left    Hyperlipidemia    Hypertension    Increased intraocular pressure    Lumbar degenerative disc disease 03/2017   Nocturia    Paroxysmal atrial fibrillation 06/30/2014   chad2vasc score is at least 4   Popliteal artery aneurysm LEFT   PVD (peripheral vascular disease)    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   Retinal detachment    SBO (small bowel obstruction)    Stroke    mini stroke/2017    Past Surgical History:  Procedure Laterality Date   AAA REPAIR W/ AORTOBIFEMORAL BYPASS  OCT  16109   ABDOMINAL HERNIA REPAIR  X4  (LAST ONE 1990'S)   inguinal herniorrhaphy   CARDIAC CATHETERIZATION N/A 12/16/2015   Procedure: Left Heart Cath and Coronary Angiography;  Surgeon: Wellington Hampshire, MD;  Location: [facility];  Service: Cardiovascular;  Laterality: N/A;   CARPAL TUNNEL RELEASE Right 10/2011   Hand   CORONARY ANGIOPLASTY  2002   PTCA  W/ X2 BM STENTS--   PROXIMA LAD  &  LEFT CIRCUMFLEX TO FIRST OBTUSE MARGINAL BRANCH   CORONARY ARTERY BYPASS GRAFT N/A 12/18/2015   Procedure: CORONARY ARTERY BYPASS GRAFTING (CABG) x 3,  using left internal mammary artery and bilateral   leg greater saphenous vein harvested endoscopically  LIMA to LAD, SVG to Circumflex, SVG to RCA;  Surgeon: Grace Isaac, MD;  Location: [facility];  Service: Open Heart Surgery;  Laterality: N/A;   CORONARY ARTERY BYPASS GRAFT     3 vessel   ENDARTERECTOMY Left 03/11/2015   Procedure: ENDARTERECTOMY LEFT CAROTID;  Surgeon: Rosetta Posner, MD;  Location: [facility];  Service: Vascular;  Laterality: Left;   EXPLORATORY LAPAROTOMY W/ ENTEROLYSIS FOR PARTIAL SBO  09/23/2008   heart stents  02/19/2000   HYDROCELE EXCISION  01/24/2011   Procedure: HYDROCELECTOMY ADULT;  Surgeon: Bernestine Amass, MD;  Location: [facility];  Service: Urology;  Laterality: Left;   Ingrown Toe Nail Right 01/2013   Right Great Toe nail   intest blockage  09/22/2008   PATCH  ANGIOPLASTY Left 03/11/2015   Procedure: PATCH ANGIOPLASTY LEFT CAROTID USING HEMASHIELD PLATINUM FINESSE PATCH;  Surgeon: Rosetta Posner, MD;  Location: [facility];  Service: Vascular;  Laterality: Left;   PULLEY RELEASE -- RIGHT 5TH FINGER  2009   RETINAL DETACHMENT SURGERY  2004   BILATERAL   TEE WITHOUT CARDIOVERSION N/A 12/18/2015   Procedure: TRANSESOPHAGEAL ECHOCARDIOGRAM (TEE);  Surgeon: Grace Isaac, MD;  Location: [facility];  Service: Open Heart Surgery;  Laterality: N/A;   TOOTH EXTRACTION  12/2011   TOTAL HIP ARTHROPLASTY Right 09/02/2020   Procedure: TOTAL HIP ARTHROPLASTY ANTERIOR APPROACH;  Surgeon: Gaynelle Arabian, MD;  Location: [facility];  Service: Orthopedics;  Laterality: Right;     Current Outpatient Medications  Medication Sig Dispense Refill   acetaminophen (TYLENOL) 500 MG tablet Take 2 tablets (1,000 mg total) by mouth every 6 (six) hours as needed. (Patient taking differently: Take 1,000 mg by mouth every 6 (six) hours as needed for moderate pain or headache.) 30 tablet 0   apixaban (ELIQUIS) 5 MG TABS tablet Take 1 tablet (5 mg total) by mouth 2 (two) times daily. 60 tablet 6   Apoaequorin (PREVAGEN) 10 MG CAPS Take 10 mg by mouth daily.     Artificial Tear Solution (SOOTHE XP) SOLN Place 1 drop into both eyes daily.     b complex vitamins tablet Take 1 tablet by mouth daily.     calcium carbonate (OS-CAL) 600 MG TABS Take 600 mg by mouth daily.     Cholecalciferol 1000 UNITS capsule Take 1,000 Units by mouth daily.     Coenzyme Q10 (CO Q 10) 100 MG CAPS Take 300 mg by mouth daily.     cyanocobalamin 1000 MCG tablet Take 1,000 mcg by mouth daily.     EPINEPHrine 0.3 mg/0.3 mL IJ SOAJ injection Inject 0.3 mg into the muscle daily as needed for anaphylaxis (allergic reaction).      finasteride (PROSCAR) 5 MG tablet Take 5 mg by mouth at bedtime.     hydrALAZINE (APRESOLINE) 25 MG tablet Take 25-50 mg by mouth See admin instructions. Take 25 mg in the morning and 50 mg in the  evening     losartan (COZAAR) 50 MG tablet Take 1 tablet (50 mg total) by mouth daily. 90 tablet 3   Magnesium Hydroxide (MAGNESIA PO) Take 500-1,000 mg by mouth See admin instructions. Take 500 mg in the morning 1000 mg at bedtime     Multiple Vitamin (MULTIVITAMIN) tablet Take 1 tablet by mouth daily.     Omega-3 Fatty Acids (FISH OIL PO) Take 1,200 mg by mouth 2 (two)  times daily.     potassium gluconate 595 (99 K) MG TABS tablet Take 99 mg by mouth daily.     rosuvastatin (CRESTOR) 40 MG tablet Take 1 tablet (40 mg total) by mouth every evening. 90 tablet 3   Tamsulosin HCl (FLOMAX) 0.4 MG CAPS Take 0.8 mg by mouth at bedtime.     traMADol (ULTRAM) 50 MG tablet 1-2 TABLETS AS NEEDED ORALLY TWICE A DAY 90 DAYS     vitamin C (ASCORBIC ACID) 500 MG tablet Take 500 mg by mouth daily.     zinc gluconate 50 MG tablet Take 50 mg by mouth daily.     No current facility-administered medications for this visit.    Allergies:   Wasp venom and Ramipril    Social History:  The patient  reports that he quit smoking about 44 years ago. His smoking use included cigarettes. He has a 30.00 pack-year smoking history. He has never been exposed to tobacco smoke. He has never used smokeless tobacco. He reports that he does not currently use alcohol after a past usage of about 2.0 standard drinks of alcohol per week. He reports that he does not use drugs.   Family History:  The patient's family history includes Cancer (age of onset: 24) in his mother; Heart disease in his mother; Heart failure in his father; Hyperlipidemia in his mother.    ROS:  Please see the history of present illness.   Otherwise, review of systems are positive for notes irregular pulse with Cartia mobile; right knee pain.   All other systems are reviewed and negative.    PHYSICAL EXAM: VS:  BP 138/66   Pulse 66   Ht 6' (1.829 m)   Wt 182 lb (82.6 kg)   SpO2 97%   BMI 24.68 kg/m  , BMI Body mass index is 24.68 kg/m. GEN: Well  nourished, well developed, in no acute distress HEENT: normal Neck: no JVD, carotid bruits, or masses Cardiac: irregularly irregular; 2/6 early systolic murmur, no rubs, or gallops,1+ bilateral pitting lower extremity edema  Respiratory:  clear to auscultation bilaterally, normal work of breathing GI: soft, nontender, nondistended, + BS MS: no deformity or atrophy Skin: warm and dry, no rash Neuro:  Strength and sensation are intact Psych: euthymic mood, full affect   EKG:   The ekg ordered today demonstrates AFib, rate controlled   Recent Labs: No results found for requested labs within last 365 days.   Lipid Panel    Component Value Date/Time   CHOL 139 01/15/2020 0846   CHOL 164 05/24/2013 0916   TRIG 69 01/15/2020 0846   TRIG 50 05/24/2013 0916   HDL 52 01/15/2020 0846   HDL 84 05/24/2013 0916   CHOLHDL 2.7 01/15/2020 0846   CHOLHDL 2.4 12/16/2015 0218   VLDL 15 12/16/2015 0218   LDLCALC 73 01/15/2020 0846   LDLCALC 70 05/24/2013 0916     Other studies Reviewed: Additional studies/ records that were reviewed today with results demonstrating: labs reviewed, June 2023 total cholesterol 143 HDL 56 LDL 76 triglycerides 51.  2021 A1c 5.5.  EF 60-65% in 2021   ASSESSMENT AND PLAN:  PAF: Amiodarone stopped due to bradycardia.  Eliquis for stroke prevention.  No bleeding issues.  CAD: Status post CABG in 2017.   Continue aggressive secondary prevention.  No angina.  Will refill nitroglycerin.  Instructions discussed with 1 tablet every 5 minutes x 3.  If no relief, call 911.  If he has any symptoms, he  needs to stop activity and rest as well. Hyperlipidemia: Whole food, plant-based diet.  High-fiber diet.  Avoid processed sugar.  LDL 76.  We discussed LDL target.  He does not want to take any more meds, and does not agree with lower targets of LDL (55). Hypertension: Avoid excessive salt.  Home readings int eh 130s/80s. The current medical regimen is effective;  continue  present plan and medications.   Carotid artery disease/PAD/AAA repair: Follows with [facility]. 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  Used a diuretic in the past.  He did not tolerate compression stockings.  For now, elevate legs is much as possible.  Could try low-dose diuretic in the future if edema got worse.    Current medicines are reviewed at length with the patient today.  The patient concerns regarding his medicines were addressed.  The following changes have been made:  No change  Labs/ tests ordered today include:  No orders of the defined types were placed in this encounter.   Recommend 150 minutes/week of aerobic exercise Low fat, low carb, high fiber diet recommended  Disposition:   FU in 1 year   Signed, Larae Grooms, MD  04/20/2022 9:37 AM    [facility] [address], [place], [place]  [address] Phone: [phone]; Fax: [phone]

## 2022-04-20 ENCOUNTER — Encounter: Payer: Self-pay | Admitting: Interventional Cardiology

## 2022-04-20 ENCOUNTER — Ambulatory Visit: Payer: PPO | Attending: Interventional Cardiology | Admitting: Interventional Cardiology

## 2022-04-20 VITALS — BP 138/66 | HR 66 | Ht 72.0 in | Wt 182.0 lb

## 2022-04-20 DIAGNOSIS — I4821 Permanent atrial fibrillation: Secondary | ICD-10-CM | POA: Diagnosis not present

## 2022-04-20 DIAGNOSIS — I779 Disorder of arteries and arterioles, unspecified: Secondary | ICD-10-CM | POA: Diagnosis not present

## 2022-04-20 DIAGNOSIS — D6859 Other primary thrombophilia: Secondary | ICD-10-CM

## 2022-04-20 DIAGNOSIS — I6523 Occlusion and stenosis of bilateral carotid arteries: Secondary | ICD-10-CM

## 2022-04-20 DIAGNOSIS — I25118 Atherosclerotic heart disease of native coronary artery with other forms of angina pectoris: Secondary | ICD-10-CM | POA: Diagnosis not present

## 2022-04-20 DIAGNOSIS — E785 Hyperlipidemia, unspecified: Secondary | ICD-10-CM | POA: Diagnosis not present

## 2022-04-20 MED ORDER — NITROGLYCERIN 0.4 MG SL SUBL
0.4000 mg | SUBLINGUAL_TABLET | SUBLINGUAL | 6 refills | Status: AC | PRN
Start: 2022-04-20 — End: ?

## 2022-04-20 NOTE — Patient Instructions (Signed)
Medication Instructions:  Your physician recommends that you continue on your current medications as directed. Please refer to the Current Medication list given to you today.  *If you need a refill on your cardiac medications before your next appointment, please call your pharmacy*   Lab Work: none If you have labs (blood work) drawn today and your tests are completely normal, you will receive your results only by: MyChart Message (if you have MyChart) OR A paper copy in the mail If you have any lab test that is abnormal or we need to change your treatment, we will call you to review the results.   Testing/Procedures: none   Follow-Up: At [facility], you and your health needs are our priority.  As part of our continuing mission to provide you with exceptional heart care, we have created designated Provider Care Teams.  These Care Teams include your primary Cardiologist (physician) and Advanced Practice Providers (APPs -  Physician Assistants and Nurse Practitioners) who all work together to provide you with the care you need, when you need it.  We recommend signing up for the patient portal called "MyChart".  Sign up information is provided on this After Visit Summary.  MyChart is used to connect with patients for Virtual Visits (Telemedicine).  Patients are able to view lab/test results, encounter notes, upcoming appointments, etc.  Non-urgent messages can be sent to your provider as well.   To learn more about what you can do with MyChart, go to [email or web address].    Your next appointment:   12 month(s)  Provider:   Jayadeep Varanasi, MD     Other Instructions    

## 2022-04-22 DIAGNOSIS — M1711 Unilateral primary osteoarthritis, right knee: Secondary | ICD-10-CM | POA: Diagnosis not present

## 2022-04-29 DIAGNOSIS — M1711 Unilateral primary osteoarthritis, right knee: Secondary | ICD-10-CM | POA: Diagnosis not present

## 2022-05-20 DIAGNOSIS — M25571 Pain in right ankle and joints of right foot: Secondary | ICD-10-CM | POA: Diagnosis not present

## 2022-05-20 DIAGNOSIS — M79671 Pain in right foot: Secondary | ICD-10-CM | POA: Diagnosis not present

## 2022-05-25 DIAGNOSIS — M1711 Unilateral primary osteoarthritis, right knee: Secondary | ICD-10-CM | POA: Diagnosis not present

## 2022-06-10 DIAGNOSIS — M25461 Effusion, right knee: Secondary | ICD-10-CM | POA: Diagnosis not present

## 2022-06-10 DIAGNOSIS — M1711 Unilateral primary osteoarthritis, right knee: Secondary | ICD-10-CM | POA: Diagnosis not present

## 2022-06-15 DIAGNOSIS — D509 Iron deficiency anemia, unspecified: Secondary | ICD-10-CM | POA: Diagnosis not present

## 2022-06-15 DIAGNOSIS — E559 Vitamin D deficiency, unspecified: Secondary | ICD-10-CM | POA: Diagnosis not present

## 2022-06-15 DIAGNOSIS — Z79899 Other long term (current) drug therapy: Secondary | ICD-10-CM | POA: Diagnosis not present

## 2022-06-15 DIAGNOSIS — I739 Peripheral vascular disease, unspecified: Secondary | ICD-10-CM | POA: Diagnosis not present

## 2022-06-15 DIAGNOSIS — N183 Chronic kidney disease, stage 3 unspecified: Secondary | ICD-10-CM | POA: Diagnosis not present

## 2022-06-15 DIAGNOSIS — J310 Chronic rhinitis: Secondary | ICD-10-CM | POA: Diagnosis not present

## 2022-06-15 DIAGNOSIS — D6869 Other thrombophilia: Secondary | ICD-10-CM | POA: Diagnosis not present

## 2022-06-15 DIAGNOSIS — I480 Paroxysmal atrial fibrillation: Secondary | ICD-10-CM | POA: Diagnosis not present

## 2022-06-15 DIAGNOSIS — I701 Atherosclerosis of renal artery: Secondary | ICD-10-CM | POA: Diagnosis not present

## 2022-06-30 ENCOUNTER — Other Ambulatory Visit: Payer: Self-pay | Admitting: *Deleted

## 2022-06-30 DIAGNOSIS — I779 Disorder of arteries and arterioles, unspecified: Secondary | ICD-10-CM

## 2022-06-30 DIAGNOSIS — I6523 Occlusion and stenosis of bilateral carotid arteries: Secondary | ICD-10-CM

## 2022-07-08 ENCOUNTER — Ambulatory Visit (HOSPITAL_COMMUNITY)
Admission: RE | Admit: 2022-07-08 | Discharge: 2022-07-08 | Disposition: A | Discharge status: Home / Self Care | Payer: PPO | Source: Ambulatory Visit | Attending: Vascular Surgery | Admitting: Vascular Surgery

## 2022-07-08 ENCOUNTER — Ambulatory Visit (INDEPENDENT_AMBULATORY_CARE_PROVIDER_SITE_OTHER)
Admission: RE | Admit: 2022-07-08 | Discharge: 2022-07-08 | Disposition: A | Discharge status: Home / Self Care | Payer: PPO | Source: Ambulatory Visit | Attending: Vascular Surgery | Admitting: Vascular Surgery

## 2022-07-08 DIAGNOSIS — I779 Disorder of arteries and arterioles, unspecified: Secondary | ICD-10-CM

## 2022-07-08 DIAGNOSIS — I6523 Occlusion and stenosis of bilateral carotid arteries: Secondary | ICD-10-CM | POA: Diagnosis not present

## 2022-07-08 LAB — VAS US ABI WITH/WO TBI: Right ABI: 0.77

## 2022-07-13 ENCOUNTER — Encounter: Payer: Self-pay | Admitting: Vascular Surgery

## 2022-07-13 ENCOUNTER — Ambulatory Visit: Payer: PPO | Admitting: Vascular Surgery

## 2022-07-13 VITALS — BP 142/95 | HR 73 | Temp 97.8°F | Ht 72.0 in | Wt 177.0 lb

## 2022-07-13 DIAGNOSIS — I779 Disorder of arteries and arterioles, unspecified: Secondary | ICD-10-CM

## 2022-07-13 DIAGNOSIS — I6521 Occlusion and stenosis of right carotid artery: Secondary | ICD-10-CM | POA: Diagnosis not present

## 2022-07-13 NOTE — Progress Notes (Signed)
[facility] [place]  Patient name: [name] MRN: [id number] DOB: [birth year] Sex: male  REASON FOR VISIT: Follow-up diffuse peripheral vascular occlusive disease  HPI: [name] is a 82 y.o. male here today for follow-up.  He looks quite good today.  He remains active and plays 18 holes of golf 2 days a week.  He has no neurologic deficits.  He does have calf claudication which is mildly limiting to him.  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  Also status post left carotid arterectomy for severe asymptomatic disease in 2017.  Also had coronary artery bypass grafting in 2017.  Past Medical History:  Diagnosis Date   Arthritis    HANDS AND FEET   Asymptomatic carotid artery stenosis BILATERAL   PER DOPPLER  03/2016  RICA  1 - 39%/  s/p  L CEA  1-39%- followed by Dr. Arbie Cookey   CAD (coronary artery disease)    s/p remote stenting, s/p CABG with LIMA to LAD, SVG to RCA and SVG to LCx 12/2015   Cataract immature BILATERAL    Diverticulosis    History of AAA (abdominal aortic aneurysm) repair 2000   W/ AORTOVIFEMORAL BYPASS   History of diverticulosis    Noted on Colonoscopy   History of inguinal herniorrhaphy    Hydrocele, left    Hyperlipidemia    Hypertension    Increased intraocular pressure    Lumbar degenerative disc disease 03/2017   Nocturia    Paroxysmal atrial fibrillation (HCC) 06/30/2014   chad2vasc score is at least 4   Popliteal artery aneurysm (HCC) LEFT   PVD (peripheral vascular disease) (HCC)    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   Retinal detachment    SBO (small bowel obstruction) (HCC)    Stroke (HCC)    mini stroke/2017    Family History  Problem Relation Age of Onset   Heart disease Mother        AAA  Before age 28   Cancer Mother 100       Colon cancer   Hyperlipidemia Mother    Heart failure Father     SOCIAL HISTORY: Social History    Tobacco Use   Smoking status: Former    Packs/day: 1.00    Years: 30.00    Additional pack years: 0.00    Total pack years: 30.00    Types: Cigarettes    Quit date: 01/18/1978    Years since quitting: 44.5    Passive exposure: Never   Smokeless tobacco: Never  Substance Use Topics   Alcohol use: Not Currently    Alcohol/week: 2.0 standard drinks of alcohol    Types: 2 Shots of liquor per week    Comment: 2 oz of vodka per day    Allergies  Allergen Reactions   Wasp Venom Shortness Of Breath and Swelling   Ramipril Swelling    Current Outpatient Medications  Medication Sig Dispense Refill   acetaminophen (TYLENOL) 500 MG tablet Take 2 tablets (1,000 mg total) by mouth every 6 (six) hours as needed. (Patient taking differently: Take 1,000 mg by mouth every 6 (six) hours as needed for moderate pain or headache.) 30 tablet 0   apixaban (ELIQUIS) 5 MG TABS tablet Take 1 tablet (5 mg total) by mouth 2 (two) times daily. 60 tablet 6   Apoaequorin (PREVAGEN) 10 MG CAPS Take 10 mg by mouth daily.     Artificial Tear Solution (SOOTHE XP)  SOLN Place 1 drop into both eyes daily.     b complex vitamins tablet Take 1 tablet by mouth daily.     calcium carbonate (OS-CAL) 600 MG TABS Take 600 mg by mouth daily.     Cholecalciferol 1000 UNITS capsule Take 1,000 Units by mouth daily.     Coenzyme Q10 (CO Q 10) 100 MG CAPS Take 300 mg by mouth daily.     cyanocobalamin 1000 MCG tablet Take 1,000 mcg by mouth daily.     EPINEPHrine 0.3 mg/0.3 mL IJ SOAJ injection Inject 0.3 mg into the muscle daily as needed for anaphylaxis (allergic reaction).      Ferrous Sulfate (IRON) 325 (65 Fe) MG TABS 1 tablet Orally Once a day     finasteride (PROSCAR) 5 MG tablet Take 5 mg by mouth at bedtime.     hydrALAZINE (APRESOLINE) 25 MG tablet Take 25-50 mg by mouth See admin instructions. Take 25 mg in the morning and 50 mg in the evening     Magnesium Hydroxide (MAGNESIA PO) Take 500-1,000 mg by mouth See  admin instructions. Take 500 mg in the morning 1000 mg at bedtime     Multiple Vitamin (MULTIVITAMIN) tablet Take 1 tablet by mouth daily.     nitroGLYCERIN (NITROSTAT) 0.4 MG SL tablet Place 1 tablet (0.4 mg total) under the tongue every 5 (five) minutes as needed for chest pain. 25 tablet 6   Omega-3 Fatty Acids (FISH OIL PO) Take 1,200 mg by mouth 2 (two) times daily.     potassium gluconate 595 (99 K) MG TABS tablet Take 99 mg by mouth daily.     rosuvastatin (CRESTOR) 40 MG tablet Take 1 tablet (40 mg total) by mouth every evening. 90 tablet 3   Tamsulosin HCl (FLOMAX) 0.4 MG CAPS Take 0.8 mg by mouth at bedtime.     traMADol (ULTRAM) 50 MG tablet 1-2 TABLETS AS NEEDED ORALLY TWICE A DAY 90 DAYS     vitamin C (ASCORBIC ACID) 500 MG tablet Take 500 mg by mouth daily.     zinc gluconate 50 MG tablet Take 50 mg by mouth daily.     losartan (COZAAR) 50 MG tablet Take 1 tablet (50 mg total) by mouth daily. 90 tablet 3   No current facility-administered medications for this visit.    REVIEW OF SYSTEMS:  [X]  denotes positive finding, [ ]  denotes negative finding Cardiac  Comments:  Chest pain or chest pressure:    Shortness of breath upon exertion:    Short of breath when lying flat:    Irregular heart rhythm:        Vascular    Pain in calf, thigh, or hip brought on by ambulation:    Pain in feet at night that wakes you up from your sleep:     Blood clot in your veins:    Leg swelling:           PHYSICAL EXAM: Vitals:   07/13/22 1011 07/13/22 1014  BP: (!) 160/77 (!) 142/95  Pulse: 73   Temp: 97.8 F (36.6 C)   SpO2: 99%   Weight: 177 lb (80.3 kg)   Height: 6' (1.829 m)     GENERAL: The patient is a well-nourished male, in no acute distress. The vital signs are documented above. CARDIOVASCULAR: Well-healed left carotid incision.  No bruits bilaterally.  2+ radial and 2+ femoral pulses bilaterally.  I do not palpate popliteal or distal pulses. PULMONARY: There is good air  exchange  MUSCULOSKELETAL: There are no major deformities or cyanosis. NEUROLOGIC: No focal weakness or paresthesias are detected. SKIN: There are no ulcers or rashes noted. PSYCHIATRIC: The patient has a normal affect.  DATA:  Noninvasive studies from 07/08/2022 reviewed with the patient.  He has a widely patent left carotid endarterectomy site.  His right carotid is moderate stenosis in the 40 to 59% range.  His lower extremity studies reveal ankle arm index of 0.77 on the right and he is noncompressible on the left making index unreliable.  MEDICAL ISSUES: Stable overall.  We will see him again in 1 year with repeat carotid duplex to rule out any progression of asymptomatic disease.  I would not recommend any lower extremity studies unless he has new or progressive symptoms.    Larina Earthly, MD FACS [facility] [phone]  Note: Portions of this report may have been transcribed using voice recognition software.  Every effort has been made to ensure accuracy; however, inadvertent computerized transcription errors may still be present.

## 2022-07-16 ENCOUNTER — Other Ambulatory Visit: Payer: Self-pay

## 2022-07-16 DIAGNOSIS — I6523 Occlusion and stenosis of bilateral carotid arteries: Secondary | ICD-10-CM

## 2022-07-20 DIAGNOSIS — D6869 Other thrombophilia: Secondary | ICD-10-CM | POA: Diagnosis not present

## 2022-07-20 DIAGNOSIS — I480 Paroxysmal atrial fibrillation: Secondary | ICD-10-CM | POA: Diagnosis not present

## 2022-07-20 DIAGNOSIS — J310 Chronic rhinitis: Secondary | ICD-10-CM | POA: Diagnosis not present

## 2022-07-20 DIAGNOSIS — I739 Peripheral vascular disease, unspecified: Secondary | ICD-10-CM | POA: Diagnosis not present

## 2022-07-20 DIAGNOSIS — Z79899 Other long term (current) drug therapy: Secondary | ICD-10-CM | POA: Diagnosis not present

## 2022-07-20 DIAGNOSIS — I2510 Atherosclerotic heart disease of native coronary artery without angina pectoris: Secondary | ICD-10-CM | POA: Diagnosis not present

## 2022-07-20 DIAGNOSIS — Z0000 Encounter for general adult medical examination without abnormal findings: Secondary | ICD-10-CM | POA: Diagnosis not present

## 2022-07-20 DIAGNOSIS — D649 Anemia, unspecified: Secondary | ICD-10-CM | POA: Diagnosis not present

## 2022-07-20 DIAGNOSIS — I701 Atherosclerosis of renal artery: Secondary | ICD-10-CM | POA: Diagnosis not present

## 2022-07-20 DIAGNOSIS — Z1331 Encounter for screening for depression: Secondary | ICD-10-CM | POA: Diagnosis not present

## 2022-07-20 DIAGNOSIS — E559 Vitamin D deficiency, unspecified: Secondary | ICD-10-CM | POA: Diagnosis not present

## 2022-07-27 DIAGNOSIS — M9902 Segmental and somatic dysfunction of thoracic region: Secondary | ICD-10-CM | POA: Diagnosis not present

## 2022-07-27 DIAGNOSIS — M546 Pain in thoracic spine: Secondary | ICD-10-CM | POA: Diagnosis not present

## 2022-07-27 DIAGNOSIS — M542 Cervicalgia: Secondary | ICD-10-CM | POA: Diagnosis not present

## 2022-07-27 DIAGNOSIS — M9901 Segmental and somatic dysfunction of cervical region: Secondary | ICD-10-CM | POA: Diagnosis not present

## 2022-08-03 DIAGNOSIS — H938X1 Other specified disorders of right ear: Secondary | ICD-10-CM | POA: Diagnosis not present

## 2022-08-03 DIAGNOSIS — H9191 Unspecified hearing loss, right ear: Secondary | ICD-10-CM | POA: Diagnosis not present

## 2022-08-17 DIAGNOSIS — C44329 Squamous cell carcinoma of skin of other parts of face: Secondary | ICD-10-CM | POA: Diagnosis not present

## 2022-08-17 DIAGNOSIS — D485 Neoplasm of uncertain behavior of skin: Secondary | ICD-10-CM | POA: Diagnosis not present

## 2022-08-17 DIAGNOSIS — L821 Other seborrheic keratosis: Secondary | ICD-10-CM | POA: Diagnosis not present

## 2022-08-17 DIAGNOSIS — D225 Melanocytic nevi of trunk: Secondary | ICD-10-CM | POA: Diagnosis not present

## 2022-08-17 DIAGNOSIS — L570 Actinic keratosis: Secondary | ICD-10-CM | POA: Diagnosis not present

## 2022-08-17 DIAGNOSIS — Z85828 Personal history of other malignant neoplasm of skin: Secondary | ICD-10-CM | POA: Diagnosis not present

## 2022-09-21 DIAGNOSIS — S83241D Other tear of medial meniscus, current injury, right knee, subsequent encounter: Secondary | ICD-10-CM | POA: Diagnosis not present

## 2022-09-30 ENCOUNTER — Encounter (INDEPENDENT_AMBULATORY_CARE_PROVIDER_SITE_OTHER): Payer: PPO | Admitting: Ophthalmology

## 2022-09-30 DIAGNOSIS — H35033 Hypertensive retinopathy, bilateral: Secondary | ICD-10-CM | POA: Diagnosis not present

## 2022-09-30 DIAGNOSIS — H338 Other retinal detachments: Secondary | ICD-10-CM | POA: Diagnosis not present

## 2022-09-30 DIAGNOSIS — I10 Essential (primary) hypertension: Secondary | ICD-10-CM | POA: Diagnosis not present

## 2022-09-30 DIAGNOSIS — H43813 Vitreous degeneration, bilateral: Secondary | ICD-10-CM

## 2022-09-30 DIAGNOSIS — H33302 Unspecified retinal break, left eye: Secondary | ICD-10-CM

## 2022-11-01 ENCOUNTER — Ambulatory Visit (INDEPENDENT_AMBULATORY_CARE_PROVIDER_SITE_OTHER): Payer: PPO | Admitting: Otolaryngology

## 2022-11-01 ENCOUNTER — Encounter (INDEPENDENT_AMBULATORY_CARE_PROVIDER_SITE_OTHER): Payer: Self-pay

## 2022-11-01 VITALS — Ht 73.0 in | Wt 177.0 lb

## 2022-11-01 DIAGNOSIS — H918X1 Other specified hearing loss, right ear: Secondary | ICD-10-CM

## 2022-11-01 DIAGNOSIS — H6901 Patulous Eustachian tube, right ear: Secondary | ICD-10-CM

## 2022-11-02 DIAGNOSIS — H6901 Patulous Eustachian tube, right ear: Secondary | ICD-10-CM | POA: Insufficient documentation

## 2022-11-02 NOTE — Progress Notes (Signed)
Patient ID: [name], male   DOB: 03-Sep-1940, 82 y.o.   MRN: [id number]  CC: Clogging sensation in right ear  HPI: The patient is an 82 year old male who presents today complaining of intermittent clogging sensation in his right ear for the past 1-1/2 years.  His symptoms typically starts late in the afternoon.  The abnormal sensation resolves at night when he is in bed.  When he is symptomatic, he complains of muffled hearing.  He has occasional autophony.  He denies any significant otalgia or otorrhea.  He was recently evaluated at [facility].  His head CT scan was negative for any sinus disease.  The patient was treated with Flonase nasal spray for the past 2 to 3 months without any improvement.  He denies any significant weight loss.  He has no previous ENT surgery.  His hearing was recently tested at [facility].  According to the patient, no significant abnormality was noted.  Past Medical History:  Diagnosis Date   Arthritis    HANDS AND FEET   Asymptomatic carotid artery stenosis BILATERAL   PER DOPPLER  03/2016  RICA  1 - 39%/  s/p  L CEA  1-39%- followed by Dr. Arbie Cookey   CAD (coronary artery disease)    s/p remote stenting, s/p CABG with LIMA to LAD, SVG to RCA and SVG to LCx 12/2015   Cataract immature BILATERAL    Diverticulosis    History of AAA (abdominal aortic aneurysm) repair 2000   W/ AORTOVIFEMORAL BYPASS   History of diverticulosis    Noted on Colonoscopy   History of inguinal herniorrhaphy    Hydrocele, left    Hyperlipidemia    Hypertension    Increased intraocular pressure    Lumbar degenerative disc disease 03/2017   Nocturia    Paroxysmal atrial fibrillation (HCC) 06/30/2014   chad2vasc score is at least 4   Popliteal artery aneurysm (HCC) LEFT   PVD (peripheral vascular disease) (HCC)    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   Retinal detachment    SBO (small bowel obstruction) (HCC)    Stroke (HCC)    mini stroke/2017    Past Surgical History:   Procedure Laterality Date   AAA REPAIR W/ AORTOBIFEMORAL BYPASS  OCT  78295   ABDOMINAL HERNIA REPAIR  X4  (LAST ONE 1990'S)   inguinal herniorrhaphy   CARDIAC CATHETERIZATION N/A 12/16/2015   Procedure: Left Heart Cath and Coronary Angiography;  Surgeon: Iran Ouch, MD;  Location: [facility] INVASIVE CV LAB;  Service: Cardiovascular;  Laterality: N/A;   CARPAL TUNNEL RELEASE Right 10/2011   Hand   CORONARY ANGIOPLASTY  2002   PTCA  W/ X2 BM STENTS--   PROXIMA LAD  &  LEFT CIRCUMFLEX TO FIRST OBTUSE MARGINAL BRANCH   CORONARY ARTERY BYPASS GRAFT N/A 12/18/2015   Procedure: CORONARY ARTERY BYPASS GRAFTING (CABG) x 3, using left internal mammary artery and bilateral   leg greater saphenous vein harvested endoscopically  LIMA to LAD, SVG to Circumflex, SVG to RCA;  Surgeon: Delight Ovens, MD;  Location: [facility] OR;  Service: Open Heart Surgery;  Laterality: N/A;   CORONARY ARTERY BYPASS GRAFT     3 vessel   ENDARTERECTOMY Left 03/11/2015   Procedure: ENDARTERECTOMY LEFT CAROTID;  Surgeon: Larina Earthly, MD;  Location: [facility] OR;  Service: Vascular;  Laterality: Left;   EXPLORATORY LAPAROTOMY W/ ENTEROLYSIS FOR PARTIAL SBO  09/23/2008   heart stents  02/19/2000   HYDROCELE EXCISION  01/24/2011   Procedure: HYDROCELECTOMY  ADULT;  Surgeon: Valetta Fuller, MD;  Location: [facility];  Service: Urology;  Laterality: Left;   Ingrown Toe Nail Right 01/2013   Right Great Toe nail   intest blockage  09/22/2008   PATCH ANGIOPLASTY Left 03/11/2015   Procedure: PATCH ANGIOPLASTY LEFT CAROTID USING HEMASHIELD PLATINUM FINESSE PATCH;  Surgeon: Larina Earthly, MD;  Location: [facility] OR;  Service: Vascular;  Laterality: Left;   PULLEY RELEASE -- RIGHT 5TH FINGER  2009   RETINAL DETACHMENT SURGERY  2004   BILATERAL   TEE WITHOUT CARDIOVERSION N/A 12/18/2015   Procedure: TRANSESOPHAGEAL ECHOCARDIOGRAM (TEE);  Surgeon: Delight Ovens, MD;  Location: [facility] OR;  Service: Open Heart Surgery;  Laterality: N/A;    TOOTH EXTRACTION  12/2011   TOTAL HIP ARTHROPLASTY Right 09/02/2020   Procedure: TOTAL HIP ARTHROPLASTY ANTERIOR APPROACH;  Surgeon: Ollen Gross, MD;  Location: [facility] ORS;  Service: Orthopedics;  Laterality: Right;    Family History  Problem Relation Age of Onset   Heart disease Mother        AAA  Before age 2   Cancer Mother 93       Colon cancer   Hyperlipidemia Mother    Heart failure Father     Social History:  reports that he quit smoking about 44 years ago. His smoking use included cigarettes. He started smoking about 74 years ago. He has a 30 pack-year smoking history. He has never been exposed to tobacco smoke. He has never used smokeless tobacco. He reports that he does not currently use alcohol after a past usage of about 2.0 standard drinks of alcohol per week. He reports that he does not use drugs.  Allergies:  Allergies  Allergen Reactions   Wasp Venom Shortness Of Breath and Swelling   Ramipril Swelling    Prior to Admission medications   Medication Sig Start Date End Date Taking? Authorizing Provider  apixaban (ELIQUIS) 5 MG TABS tablet Take 1 tablet (5 mg total) by mouth 2 (two) times daily. 06/30/14  Yes Turner, [name] R, MD  Apoaequorin (PREVAGEN) 10 MG CAPS Take 10 mg by mouth daily.   Yes [provider]  Artificial Tear Solution (SOOTHE XP) SOLN Place 1 drop into both eyes daily.   Yes [provider]  b complex vitamins tablet Take 1 tablet by mouth daily.   Yes [provider]  calcium carbonate (OS-CAL) 600 MG TABS Take 600 mg by mouth daily.   Yes [provider]  Cholecalciferol 1000 UNITS capsule Take 1,000 Units by mouth daily.   Yes [provider]  Coenzyme Q10 (CO Q 10) 100 MG CAPS Take 300 mg by mouth daily.   Yes [provider]  cyanocobalamin 1000 MCG tablet Take 1,000 mcg by mouth daily.   Yes [provider]  EPINEPHrine 0.3 mg/0.3 mL IJ SOAJ injection Inject 0.3 mg into the muscle  daily as needed for anaphylaxis (allergic reaction).    Yes [provider]  Ferrous Sulfate (IRON) 325 (65 Fe) MG TABS 1 tablet Orally Once a day   Yes [provider]  finasteride (PROSCAR) 5 MG tablet Take 5 mg by mouth at bedtime.   Yes [provider]  hydrALAZINE (APRESOLINE) 25 MG tablet Take 25-50 mg by mouth See admin instructions. Take 25 mg in the morning and 50 mg in the evening 01/30/19  Yes [provider]  Magnesium Hydroxide (MAGNESIA PO) Take 500-1,000 mg by mouth See admin instructions. Take 500 mg  in the morning 1000 mg at bedtime   Yes [provider]  Multiple Vitamin (MULTIVITAMIN) tablet Take 1 tablet by mouth daily.   Yes [provider]  nitroGLYCERIN (NITROSTAT) 0.4 MG SL tablet Place 1 tablet (0.4 mg total) under the tongue every 5 (five) minutes as needed for chest pain. 04/20/22  Yes Corky Crafts, MD  Omega-3 Fatty Acids (FISH OIL PO) Take 1,200 mg by mouth 2 (two) times daily.   Yes [provider]  potassium gluconate 595 (99 K) MG TABS tablet Take 99 mg by mouth daily.   Yes [provider]  rosuvastatin (CRESTOR) 40 MG tablet Take 1 tablet (40 mg total) by mouth every evening. 12/16/14  Yes Turner, Cornelious Bryant, MD  Tamsulosin HCl (FLOMAX) 0.4 MG CAPS Take 0.8 mg by mouth at bedtime.   Yes [provider]  traMADol (ULTRAM) 50 MG tablet 1-2 TABLETS AS NEEDED ORALLY TWICE A DAY 90 DAYS   Yes [provider]  vitamin C (ASCORBIC ACID) 500 MG tablet Take 500 mg by mouth daily.   Yes [provider]  zinc gluconate 50 MG tablet Take 50 mg by mouth daily.   Yes [provider]  acetaminophen (TYLENOL) 500 MG tablet Take 2 tablets (1,000 mg total) by mouth every 6 (six) hours as needed. Patient taking differently: Take 1,000 mg by mouth every 6 (six) hours as needed for moderate pain or headache. 12/22/15   Barrett, Erin R, PA-C  losartan (COZAAR) 50 MG tablet Take 1  tablet (50 mg total) by mouth daily. 11/21/17 04/20/22  Quintella Reichert, MD   Height 6\' 1"  (1.854 m), weight 80.3 kg. Exam: General: Communicates without difficulty, well nourished, no acute distress. Head: Normocephalic, no evidence injury, no tenderness, facial buttresses intact without stepoff. Face/sinus: No tenderness to palpation and percussion. Facial movement is normal and symmetric. Eyes: PERRL, EOMI. No scleral icterus, conjunctivae clear. Neuro: CN II exam reveals vision grossly intact.  No nystagmus at any point of gaze. Ears: Auricles well formed without lesions.  Ear canals are intact without mass or lesion.  No erythema or edema is appreciated.  The TMs are intact without fluid. Nose: External evaluation reveals normal support and skin without lesions.  Dorsum is intact.  Anterior rhinoscopy reveals normal mucosa over anterior aspect of inferior turbinates and intact septum.  No purulence noted. Oral:  Oral cavity and oropharynx are intact, symmetric, without erythema or edema.  Mucosa is moist without lesions. Neck: Full range of motion without pain.  There is no significant lymphadenopathy.  No masses palpable.  Thyroid bed within normal limits to palpation.  Parotid glands and submandibular glands equal bilaterally without mass.  Trachea is midline. Neuro:  CN 2-12 grossly intact.    Assessment: 1.  The patient's history is suggestive of right patulous eustachian tube. 2.  His ear canals, tympanic membranes, and middle ear spaces are all normal.  His recent sinus CT scan was also negative.  Plan: 1.  The physical exam findings are reviewed with the patient.  He is reassured that no middle ear effusion or infection is noted. 2.  The pathophysiology of patulous eustachian tube is discussed. 3.  The option of otology referral is discussed with the patient.  However, the patient is not interested at this time. 4.  The patient is encouraged to call with any questions or concerns.  Messiyah Waterson W  Chanel Mcadams 11/02/2022, 12:58 PM

## 2022-12-12 DIAGNOSIS — M546 Pain in thoracic spine: Secondary | ICD-10-CM | POA: Diagnosis not present

## 2022-12-12 DIAGNOSIS — M542 Cervicalgia: Secondary | ICD-10-CM | POA: Diagnosis not present

## 2022-12-12 DIAGNOSIS — M9902 Segmental and somatic dysfunction of thoracic region: Secondary | ICD-10-CM | POA: Diagnosis not present

## 2022-12-12 DIAGNOSIS — M9901 Segmental and somatic dysfunction of cervical region: Secondary | ICD-10-CM | POA: Diagnosis not present

## 2022-12-13 DIAGNOSIS — M9901 Segmental and somatic dysfunction of cervical region: Secondary | ICD-10-CM | POA: Diagnosis not present

## 2022-12-13 DIAGNOSIS — M9902 Segmental and somatic dysfunction of thoracic region: Secondary | ICD-10-CM | POA: Diagnosis not present

## 2022-12-13 DIAGNOSIS — M546 Pain in thoracic spine: Secondary | ICD-10-CM | POA: Diagnosis not present

## 2022-12-13 DIAGNOSIS — M542 Cervicalgia: Secondary | ICD-10-CM | POA: Diagnosis not present

## 2022-12-27 DIAGNOSIS — L570 Actinic keratosis: Secondary | ICD-10-CM | POA: Diagnosis not present

## 2022-12-27 DIAGNOSIS — Z85828 Personal history of other malignant neoplasm of skin: Secondary | ICD-10-CM | POA: Diagnosis not present

## 2022-12-28 DIAGNOSIS — D500 Iron deficiency anemia secondary to blood loss (chronic): Secondary | ICD-10-CM | POA: Diagnosis not present

## 2022-12-28 DIAGNOSIS — R5383 Other fatigue: Secondary | ICD-10-CM | POA: Diagnosis not present

## 2022-12-28 DIAGNOSIS — E559 Vitamin D deficiency, unspecified: Secondary | ICD-10-CM | POA: Diagnosis not present

## 2022-12-28 DIAGNOSIS — M159 Polyosteoarthritis, unspecified: Secondary | ICD-10-CM | POA: Diagnosis not present

## 2023-01-27 DIAGNOSIS — D649 Anemia, unspecified: Secondary | ICD-10-CM | POA: Diagnosis not present

## 2023-02-27 ENCOUNTER — Encounter (INDEPENDENT_AMBULATORY_CARE_PROVIDER_SITE_OTHER): Payer: PPO | Admitting: Ophthalmology

## 2023-02-27 DIAGNOSIS — H338 Other retinal detachments: Secondary | ICD-10-CM

## 2023-02-27 DIAGNOSIS — H43813 Vitreous degeneration, bilateral: Secondary | ICD-10-CM | POA: Diagnosis not present

## 2023-02-27 DIAGNOSIS — H33302 Unspecified retinal break, left eye: Secondary | ICD-10-CM

## 2023-02-28 ENCOUNTER — Encounter: Payer: Self-pay | Admitting: Cardiology

## 2023-02-28 ENCOUNTER — Ambulatory Visit: Payer: PPO | Attending: Cardiology | Admitting: Cardiology

## 2023-02-28 VITALS — BP 130/72 | HR 64 | Ht 73.0 in | Wt 172.0 lb

## 2023-02-28 DIAGNOSIS — I25118 Atherosclerotic heart disease of native coronary artery with other forms of angina pectoris: Secondary | ICD-10-CM | POA: Diagnosis not present

## 2023-02-28 DIAGNOSIS — I714 Abdominal aortic aneurysm, without rupture, unspecified: Secondary | ICD-10-CM | POA: Diagnosis not present

## 2023-02-28 DIAGNOSIS — E7800 Pure hypercholesterolemia, unspecified: Secondary | ICD-10-CM

## 2023-02-28 DIAGNOSIS — I4821 Permanent atrial fibrillation: Secondary | ICD-10-CM

## 2023-02-28 NOTE — Progress Notes (Signed)
  Cardiology Office Note:  .   Date:  02/28/2023  ID:  [name], DOB December 27, 1940, MRN [id number] PCP: Thana Ates, MD  [facility] Providers Cardiologist:  Lance Muss, MD    History of Present Illness: .   [name] is a 83 y.o. male   History of Present Illness         Doing well currently.  No symptoms no chest pain no fevers chills nausea vomiting syncope bleeding.  About to move to [place] to be with his family.  I also see his wife.  Enjoys golf.     Studies Reviewed: [name]   EKG Interpretation Date/Time:  Tuesday February 28 2023 10:19:16 EST Ventricular Rate:  68 PR Interval:    QRS Duration:  116 QT Interval:  404 QTC Calculation: 429 R Axis:   -80  Text Interpretation: Atrial fibrillation Incomplete right bundle branch block Left anterior fascicular block Possible Anterior infarct (cited on or before 17-Sep-2019) When compared with ECG of 21-Apr-2020 10:21, No significant change was found Confirmed by Donato Schultz ([id number]) on 02/28/2023 10:23:28 AM    Results         Risk Assessment/Calculations:            Physical Exam:   VS:  BP 130/72   Pulse 64   Ht 6\' 1"  (1.854 m)   Wt 172 lb (78 kg)   SpO2 96%   BMI 22.69 kg/m    Wt Readings from Last 3 Encounters:  02/28/23 172 lb (78 kg)  11/01/22 177 lb (80.3 kg)  07/13/22 177 lb (80.3 kg)    GEN: Well nourished, well developed in no acute distress NECK: No JVD; No carotid bruits CARDIAC: RRR, no murmurs, no rubs, no gallops RESPIRATORY:  Clear to auscultation without rales, wheezing or rhonchi  ABDOMEN: Soft, non-tender, non-distended EXTREMITIES:  No edema; No deformity   ASSESSMENT AND PLAN: .    Assessment and Plan          83 year old with the following issues:  Coronary artery disease - Status post CABG LIMA to LAD SVG to RCA SVG to left circumflex in 01/08/2016-Dr. Gerhardt - No anginal symptoms.  Doing well with goal-directed medical therapy.  Persistent atrial  fibrillation - EKG today shows atrial fibrillation.  No changes.  Well rate controlled.  Continue with Eliquis 5 mg twice a day for medication management  Hypertension - On losartan 50 mg a day as well as hydralazine 25 mg in the morning 50 mg in the evening.  He did have some lower blood pressures reported at home with about 100 systolic.  He is asymptomatic with this.  Continue with current regimen however if blood pressures remain low considered decreasing or discontinuing the hydralazine.  Carotid endarterectomy left AAA repair aortobifemoral bypass -Dr. Arbie Cookey.  Doing well.  No lower extremity anginal symptoms.  Very thankful.  Continuing with Eliquis.           PRN  Signed, Donato Schultz, MD

## 2023-02-28 NOTE — Patient Instructions (Signed)
Medication Instructions:  The current medical regimen is effective;  continue present plan and medications.  *If you need a refill on your cardiac medications before your next appointment, please call your pharmacy*  Follow-Up: At [facility], you and your health needs are our priority.  As part of our continuing mission to provide you with exceptional heart care, we have created designated Provider Care Teams.  These Care Teams include your primary Cardiologist (physician) and Advanced Practice Providers (APPs -  Physician Assistants and Nurse Practitioners) who all work together to provide you with the care you need, when you need it.  We recommend signing up for the patient portal called "MyChart".  Sign up information is provided on this After Visit Summary.  MyChart is used to connect with patients for Virtual Visits (Telemedicine).  Patients are able to view lab/test results, encounter notes, upcoming appointments, etc.  Non-urgent messages can be sent to your provider as well.   To learn more about what you can do with MyChart, go to [email or web address].    Your next appointment:   Follow up as needed.  Take Care!!

## 2023-06-26 IMAGING — DX DG PORTABLE PELVIS
1 series · 1 of 1 positions shown · non-contrast
Comparison: 09/02/2020

CLINICAL DATA: Status post hip replacement

EXAM:
PORTABLE PELVIS 1-2 VIEWS

[pelvis lat]
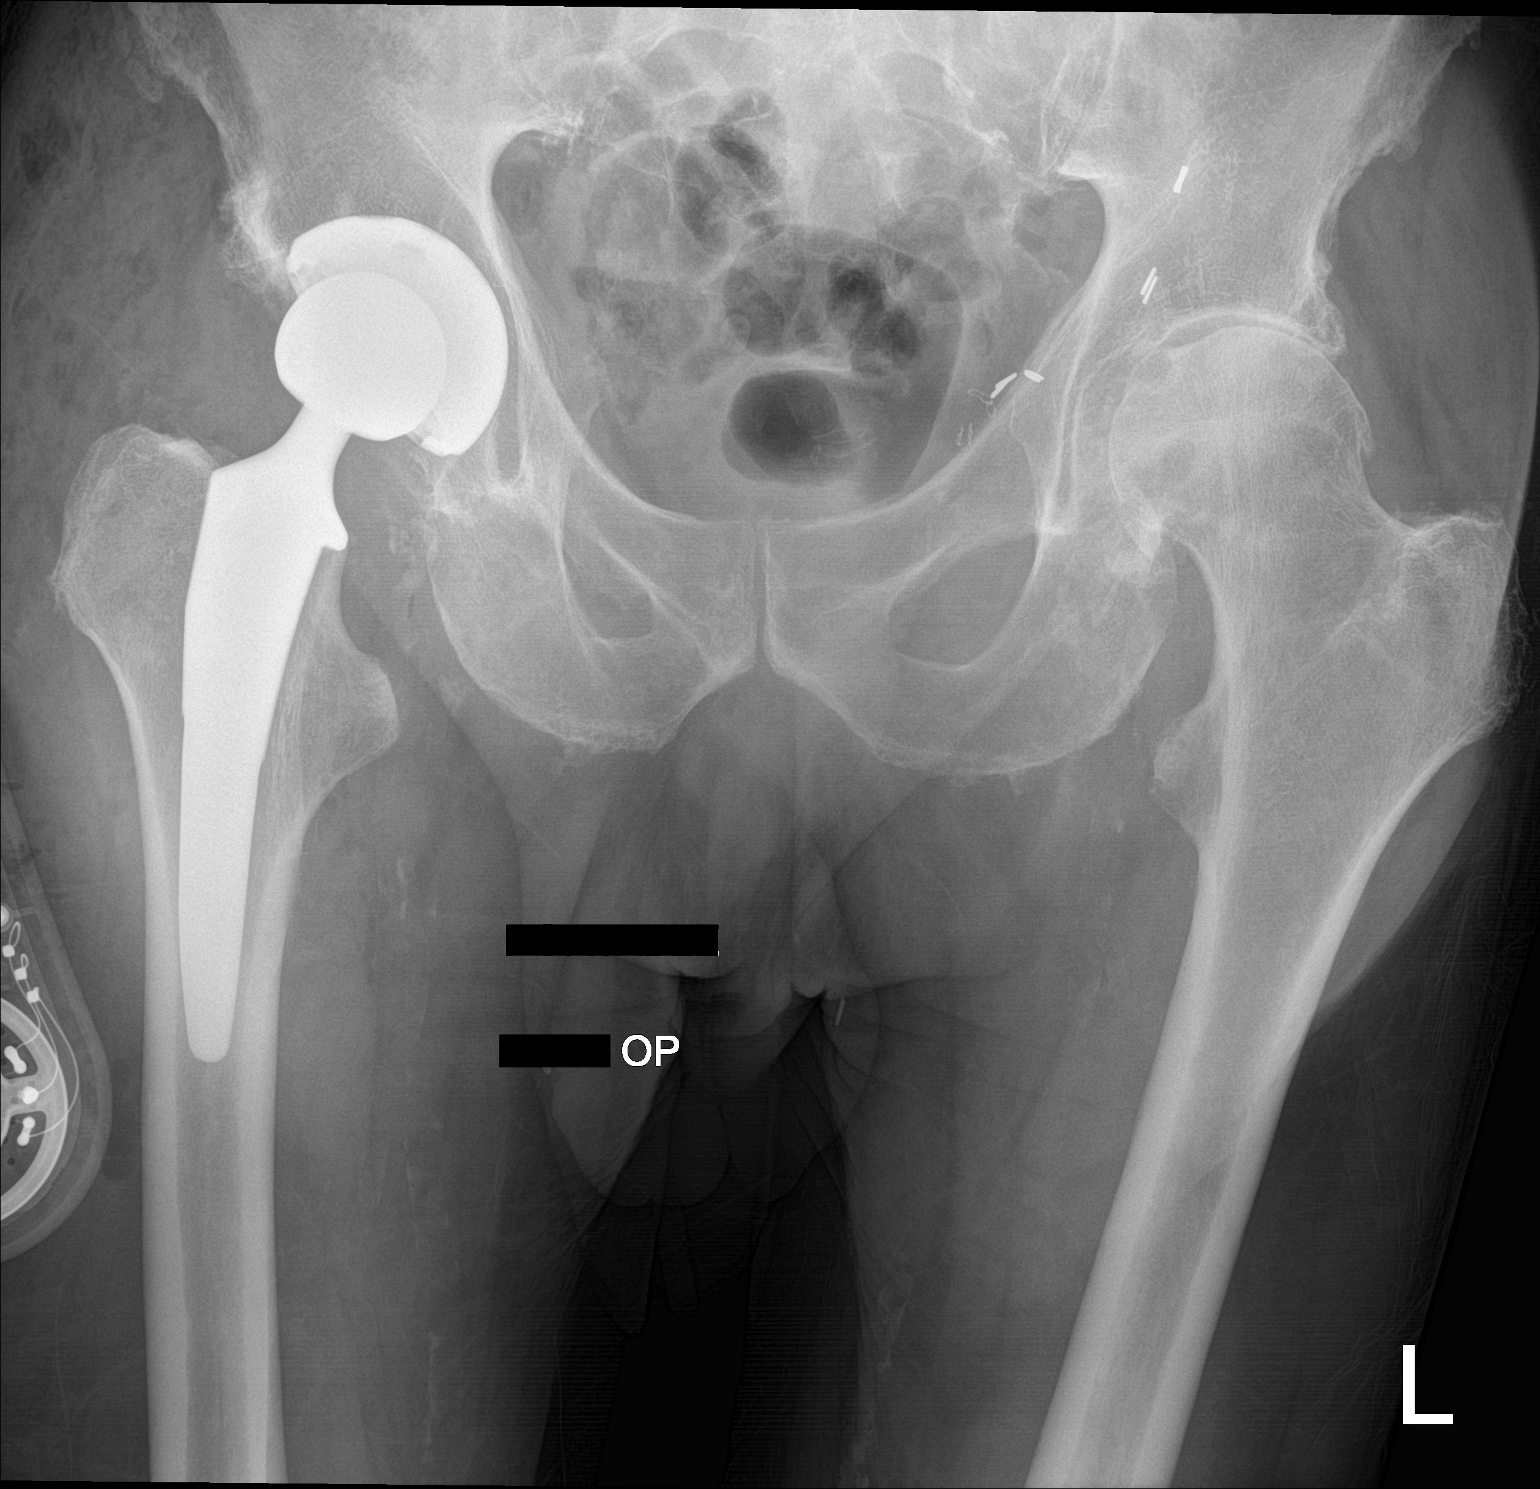

[1 of 1 positions shown; findings below may reference images not displayed]

FINDINGS: Status post right hip replacement with intact hardware and normal
alignment. Gas in the soft tissues consistent with recent surgery.
Vascular calcifications. Moderate arthritis of the left hip
IMPRESSION: Status post right hip replacement with expected postsurgical change

## 2023-06-26 IMAGING — XA DG C-ARM 1-60 MIN-NO REPORT
1 series · 2 of 2 positions shown · non-contrast
Comparison: None.

CLINICAL DATA: Right hip replacement.

EXAM:
OPERATIVE right HIP (WITH PELVIS IF PERFORMED) 2 VIEWS
TECHNIQUE: Fluoroscopic spot image(s) were submitted for interpretation
post-operatively.
Radiation exposure index: 1.2107 mGy.

[Series 1: unknown protocol · 2 of 2 slices shown]
[im 1/2]
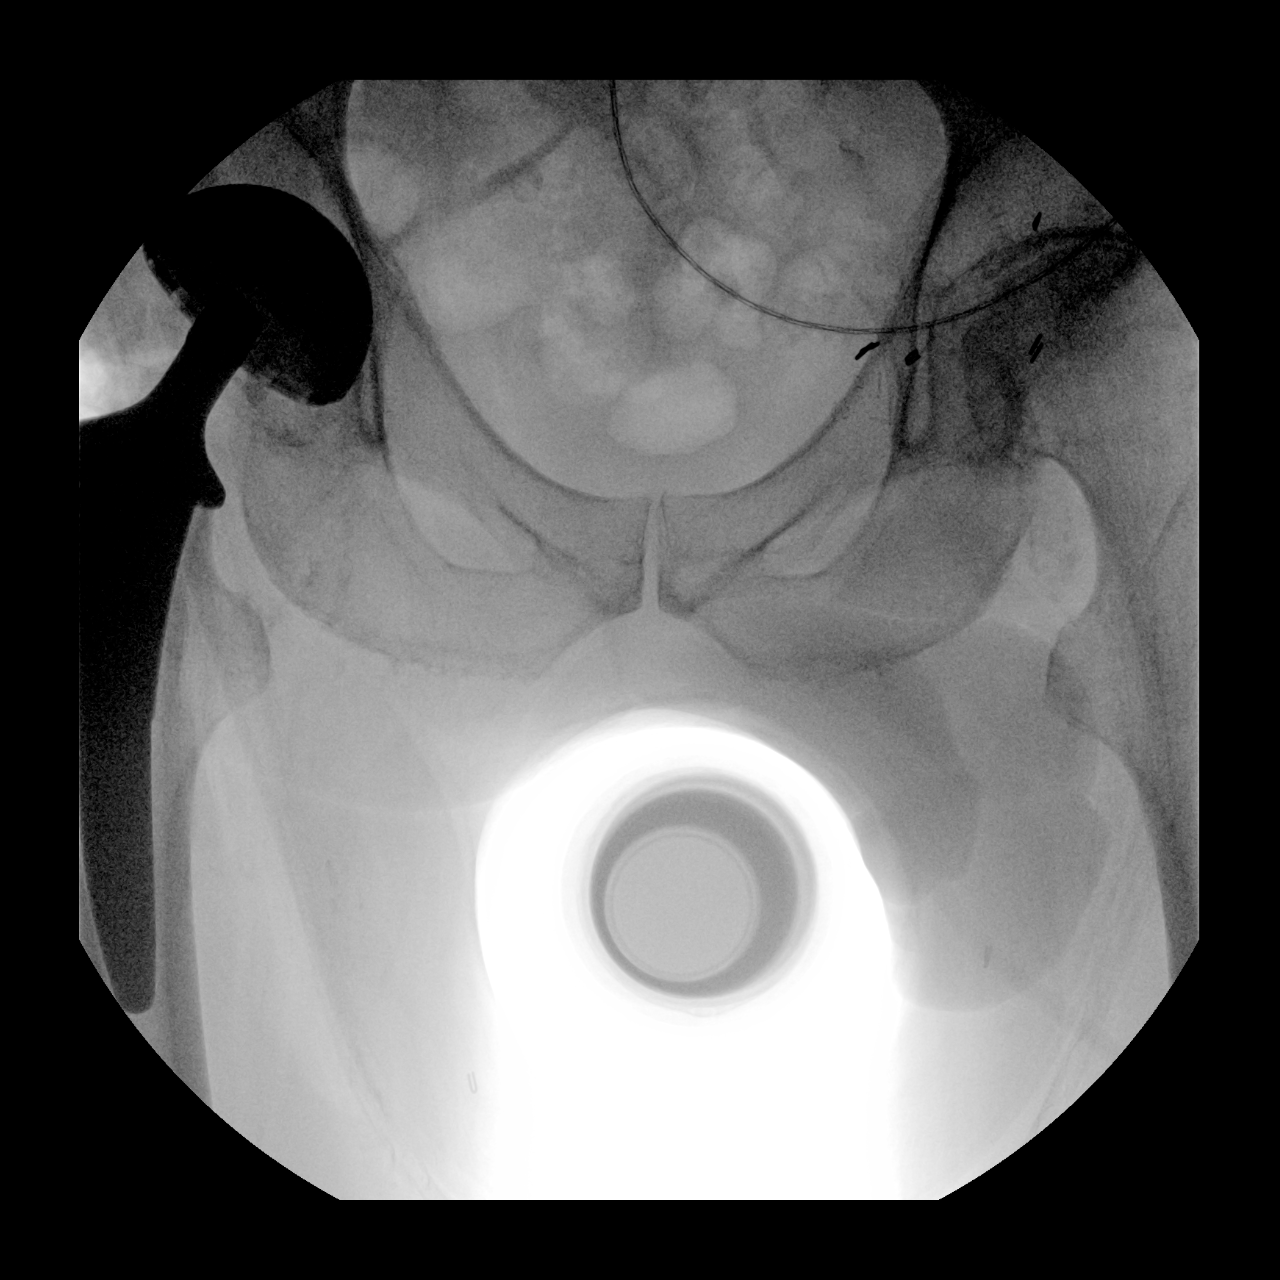
[im 2/2]
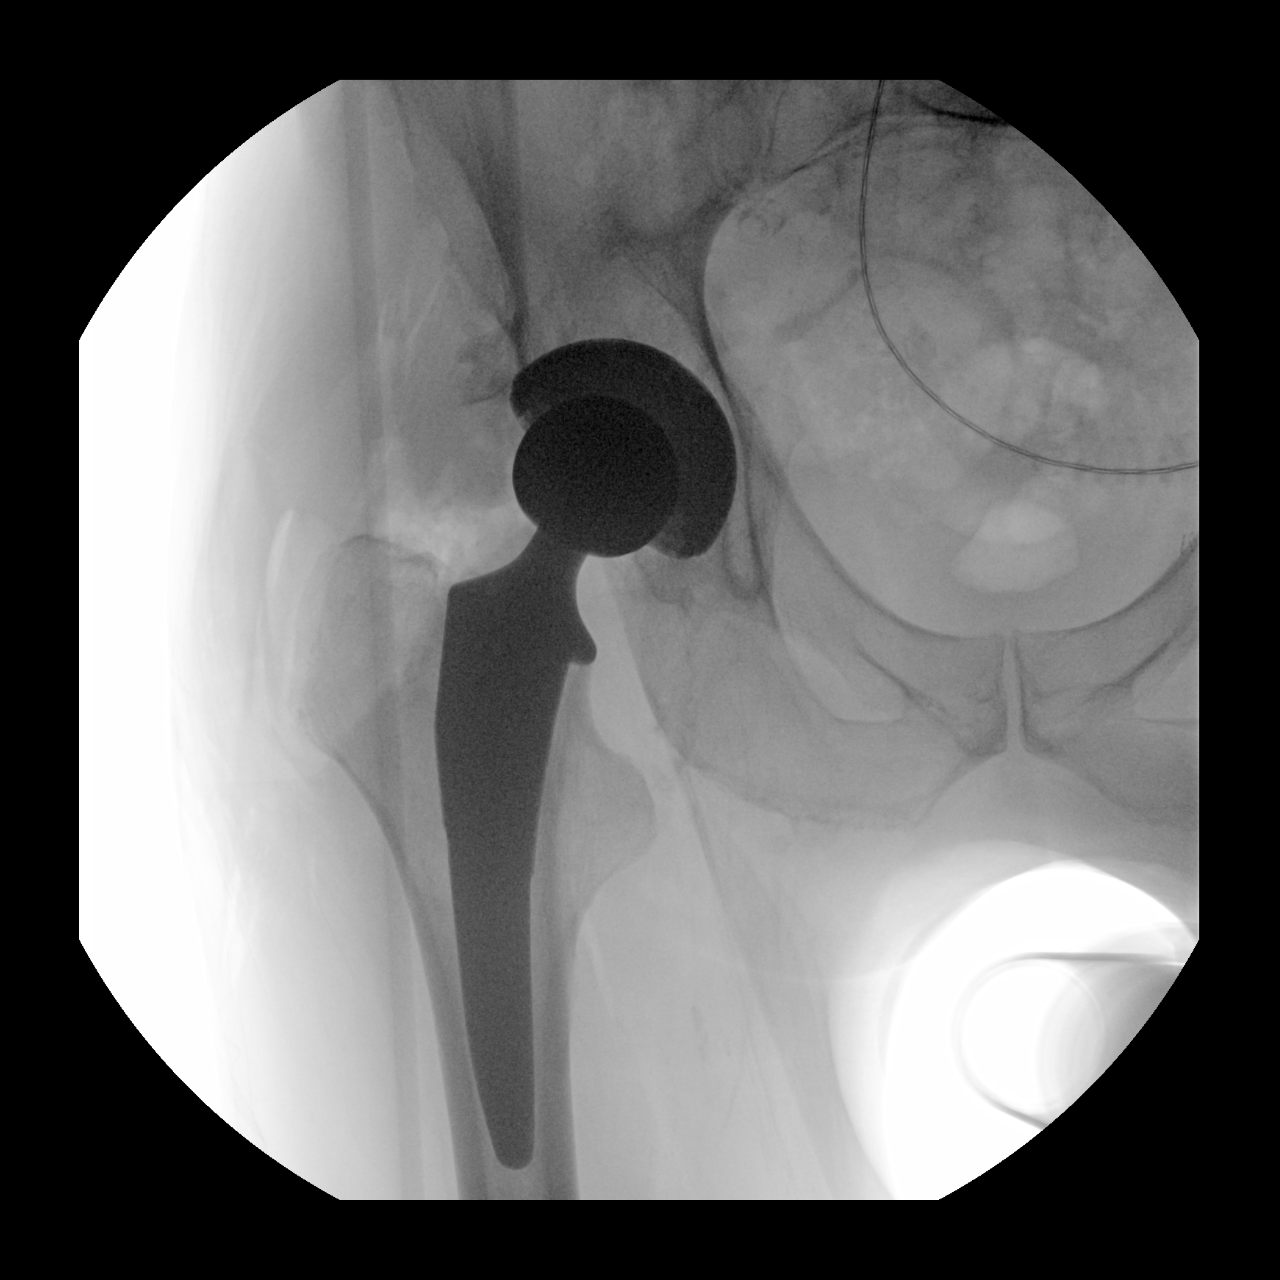

[2 of 2 positions shown; findings below may reference images not displayed]

FINDINGS: Two intraoperative fluoroscopic images were obtained of the right
hip. The right femoral and acetabular components appear to be well
situated.
IMPRESSION: Fluoroscopic guidance provided during right total hip arthroplasty.

## 2023-10-06 ENCOUNTER — Encounter (INDEPENDENT_AMBULATORY_CARE_PROVIDER_SITE_OTHER): Payer: PPO | Admitting: Ophthalmology
